# Patient Record
Sex: Female | Born: 1955 | Race: Black or African American | Hispanic: No | Marital: Single | Smoking: Current every day smoker
Health system: Southern US, Community
[De-identification: ages and names within clinical notes are randomized; demographics above are authoritative.]

## PROBLEM LIST (undated history)

## (undated) DIAGNOSIS — I050 Rheumatic mitral stenosis: Secondary | ICD-10-CM

## (undated) DIAGNOSIS — Z7901 Long term (current) use of anticoagulants: Secondary | ICD-10-CM

## (undated) DIAGNOSIS — R569 Unspecified convulsions: Secondary | ICD-10-CM

## (undated) DIAGNOSIS — I10 Essential (primary) hypertension: Secondary | ICD-10-CM

## (undated) DIAGNOSIS — Z8673 Personal history of transient ischemic attack (TIA), and cerebral infarction without residual deficits: Secondary | ICD-10-CM

## (undated) DIAGNOSIS — I739 Peripheral vascular disease, unspecified: Secondary | ICD-10-CM

## (undated) HISTORY — DX: Essential (primary) hypertension: I10

## (undated) HISTORY — DX: Long term (current) use of anticoagulants: Z79.01

## (undated) HISTORY — DX: Personal history of transient ischemic attack (TIA), and cerebral infarction without residual deficits: Z86.73

## (undated) HISTORY — PX: MITRAL VALVE REPLACEMENT: SHX147

## (undated) HISTORY — DX: Unspecified convulsions: R56.9

## (undated) HISTORY — DX: Rheumatic mitral stenosis: I05.0

## (undated) HISTORY — DX: Peripheral vascular disease, unspecified: I73.9

---

## 1996-06-01 HISTORY — PX: CARDIAC CATHETERIZATION: SHX172

## 2000-06-18 ENCOUNTER — Inpatient Hospital Stay (HOSPITAL_COMMUNITY): Admission: EM | Admit: 2000-06-18 | Discharge: 2000-06-22 | Payer: Self-pay | Admitting: Emergency Medicine

## 2007-05-03 ENCOUNTER — Ambulatory Visit: Payer: Self-pay | Admitting: Surgery

## 2007-06-02 ENCOUNTER — Ambulatory Visit: Payer: Self-pay | Admitting: Vascular Surgery

## 2007-07-04 ENCOUNTER — Ambulatory Visit: Payer: Self-pay | Admitting: Surgery

## 2007-07-04 ENCOUNTER — Ambulatory Visit (HOSPITAL_COMMUNITY): Admission: RE | Admit: 2007-07-04 | Discharge: 2007-07-04 | Payer: Self-pay | Admitting: Surgery

## 2007-07-14 ENCOUNTER — Ambulatory Visit: Payer: Self-pay | Admitting: Vascular Surgery

## 2007-10-13 ENCOUNTER — Ambulatory Visit: Payer: Self-pay | Admitting: Vascular Surgery

## 2008-08-29 ENCOUNTER — Ambulatory Visit: Payer: Self-pay | Admitting: Vascular Surgery

## 2009-09-19 ENCOUNTER — Ambulatory Visit: Payer: Self-pay | Admitting: Cardiovascular Disease

## 2009-10-24 ENCOUNTER — Ambulatory Visit: Payer: Self-pay | Admitting: Cardiology

## 2009-11-21 ENCOUNTER — Ambulatory Visit: Payer: Self-pay | Admitting: Cardiovascular Disease

## 2009-12-26 ENCOUNTER — Ambulatory Visit: Payer: Self-pay | Admitting: Cardiovascular Disease

## 2009-12-31 ENCOUNTER — Ambulatory Visit: Payer: Self-pay | Admitting: Vascular Surgery

## 2010-02-15 ENCOUNTER — Encounter: Payer: Self-pay | Admitting: Family Medicine

## 2010-02-17 ENCOUNTER — Ambulatory Visit: Payer: Self-pay | Admitting: Cardiovascular Disease

## 2010-03-12 ENCOUNTER — Other Ambulatory Visit (INDEPENDENT_AMBULATORY_CARE_PROVIDER_SITE_OTHER): Payer: Medicare Other

## 2010-03-12 DIAGNOSIS — Z7901 Long term (current) use of anticoagulants: Secondary | ICD-10-CM

## 2010-03-12 DIAGNOSIS — I119 Hypertensive heart disease without heart failure: Secondary | ICD-10-CM

## 2010-04-22 ENCOUNTER — Ambulatory Visit (INDEPENDENT_AMBULATORY_CARE_PROVIDER_SITE_OTHER): Payer: Medicare Other | Admitting: *Deleted

## 2010-04-22 DIAGNOSIS — Z952 Presence of prosthetic heart valve: Secondary | ICD-10-CM | POA: Insufficient documentation

## 2010-04-22 DIAGNOSIS — I059 Rheumatic mitral valve disease, unspecified: Secondary | ICD-10-CM | POA: Insufficient documentation

## 2010-04-22 DIAGNOSIS — I119 Hypertensive heart disease without heart failure: Secondary | ICD-10-CM

## 2010-04-22 DIAGNOSIS — Z7901 Long term (current) use of anticoagulants: Secondary | ICD-10-CM

## 2010-04-22 LAB — POCT INR: INR: 2.6

## 2010-05-21 ENCOUNTER — Encounter: Payer: Medicare Other | Admitting: *Deleted

## 2010-05-28 ENCOUNTER — Other Ambulatory Visit: Payer: Self-pay | Admitting: *Deleted

## 2010-05-28 DIAGNOSIS — I059 Rheumatic mitral valve disease, unspecified: Secondary | ICD-10-CM

## 2010-05-29 ENCOUNTER — Ambulatory Visit (INDEPENDENT_AMBULATORY_CARE_PROVIDER_SITE_OTHER): Payer: Medicare Other | Admitting: *Deleted

## 2010-05-29 DIAGNOSIS — I059 Rheumatic mitral valve disease, unspecified: Secondary | ICD-10-CM

## 2010-06-09 NOTE — Procedures (Signed)
LOWER EXTREMITY ARTERIAL DUPLEX   INDICATION:  Left lower extremity claudication.   HISTORY:  Diabetes:  No.  Cardiac:  No.  Hypertension:  Yes.  Smoking:  Previous.  Previous Surgery:  Left femoral thrombectomy.   SINGLE LEVEL ARTERIAL EXAM                          RIGHT                LEFT  Brachial:               123                  120  Anterior tibial:        139                  105  Posterior tibial:       133                  104  Peroneal:  Ankle/Brachial Index:   1.13                 0.85   LOWER EXTREMITY ARTERIAL DUPLEX EXAM   DUPLEX:  1. Patent left lower extremity arterial system.  There is an area of      focal stenosis in the mid thigh region with a velocity of 180 cm/s.  2. Distal thigh was not imaged well due to vessel depth.  3. 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   IMPRESSION:  1. Mildly diseased left ankle brachial indices.  2. Patent left arterial system with velocities shown on the following      page.   ___________________________________________  Quita Skye Hart [place], M.D.   EM/MEDQ  D:  12/31/2009  T:  12/31/2009  Job:  [id number]

## 2010-06-09 NOTE — Op Note (Signed)
Natasha Davis, Natasha Davis              ACCOUNT NO.:  [id number]   MEDICAL RECORD NO.:  [id number]         PATIENT TYPE:  CAMB   LOCATION:                                FACILITY:  [facility]   PHYSICIAN:  Juleen China IV, MDDATE OF BIRTH:  04/09/1955   DATE OF PROCEDURE:  07/04/2007  DATE OF DISCHARGE:                               OPERATIVE REPORT   PREOPERATIVE DIAGNOSIS:  Left leg claudication.   POSTOPERATIVE DIAGNOSIS:  Left leg claudication.   PROCEDURE:  1. Right common femoral artery ultrasound active.  2. Abdominal aortogram.  3. Left lower extremity runoff.  4. Second order catheterization.   PROCEDURE:  The patient was identified in the holding area and taken to  the room where she was placed supine on the table.  Bilateral groins  were prepped and draped in standard sterile fashion.  A time-out was  called.  The right common femoral artery was evaluated with ultrasound  and was found to be widely patent.  Lidocaine 1% was used for local  anesthesia.  Using ultrasound, the right common femoral artery was  accessed with an 18-gauge needle.  An 0.035 Bentson wire was advanced in  retrograde fashion into the abdominal aorta under fluoroscopic  visualization.  Next, a 5-French sheath was placed.  Over the wire, an  Omni flush catheter was placed at the level of L1 and abdominal  aortogram was obtained.  Next, catheter was pulled down the aortic  bifurcation.  An end-hole catheter was advanced with left external iliac  artery and left lower extremity runoff.   FINDINGS:  Aortogram:  Visualized portions of the suprarenal abdominal  aorta showed minimal disease. There are single renal arteries  bilaterally which are widely patent. The infrarenal abdominal aorta  showed minimal disease noted.  Bilateral external iliac arteries were  widely patent with minimal disease.   Left lower extremity:  Visualized portions of the left common femoral  artery are widely patent.  There is no  area of stenosis.  The left  profunda femoral artery is widely patent.  The left superficial femoral  artery is widely patent.  There is minimal disease present.  The left  popliteal artery is patent down to the joint space, at which time it  occludes.  Collaterals allow filling of the anterior tibial and peroneal  artery.  The anterior tibial artery crosses the ankle.   After the above images were obtained, decision was made to terminate the  procedure.  Catheters were removed and the patient was taken to the  holding area.  There were no complications.   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           ______________________________  V. Charlena Cross, MD  Electronically Signed     VWB/MEDQ  D:  07/04/2007  T:  07/05/2007  Job:  [id number]

## 2010-06-09 NOTE — Assessment & Plan Note (Signed)
OFFICE VISIT   Natasha Davis, Natasha Davis  DOB:  Sep 25, 1955                                       10/13/2007  JWJXB#:[id number]   Patient presents today for continued discussion of her left leg  discomfort.  I had seen her following an arteriogram in 06/09.  She  continues to have difficulty in her left leg.  She reports that she has  pain from her left hip down towards her left knee, and if she persists  in walking, she can have some very straightforward calf claudication.  She also reports that when she is standing, she has some difficulty in  her left calf and actually has to prop her knee forward to take pressure  off her calf.  Since my visit with her, she has been seen by [facility].  She reportedly has an MRI of her back which  showed no evidence of degenerative disease.  She reports that EEG showed  evidence of carpal tunnel syndrome.  This was on her right arm.  On  further discussion with her, she does report monitored severe symptoms  of this and has difficulty with numbness and actually dropping things  with her right hand.   PHYSICAL EXAMINATION:  Unchanged.  She does not have any evidence of  tissue loss in her left leg.  She does not have palpable left dorsalis  pedis or posterior tibial pulse.  Her foot is well perfused.   I again discussed with the patient that her recent arteriogram showed  that she did have short-segment occlusion of her left popliteal artery.  I explained that this could not explain the pain that she is having in  her left thigh and any resting symptoms that she would have.  I have  referred her to Dr. Annell Greening for further evaluation regarding both her  left hip and leg pain and also right carpal tunnel.  She will see [place]  again on an as-needed basis.   Larina Earthly, M.D.  Electronically Signed   TFE/MEDQ  D:  10/13/2007  T:  10/16/2007  Job:  [id number]   cc:   Vesta Mixer, M.D.  Kirk Ruths, M.D.  Veverly Fells. Ophelia Charter, M.D.

## 2010-06-09 NOTE — Assessment & Plan Note (Signed)
OFFICE VISIT   Natasha Davis, Natasha Davis  DOB:  11/03/1955                                       07/14/2007  ZOXWR#:[id number]   The patient presents today for followup of her recent arteriogram at  [facility]  She was treated with perioperative Lovenox due to  being required to be off Coumadin for the procedure.  The arteriogram  was on June 9th.  She continues to have symptoms of her left leg.  She  reports that she has difficulty with walking but also has pain with  standing still.  She reports that with standing, she has numbness  extending into her left foot and she also has numbness and pain  extending from her knee over her lateral thigh up into her hip and  buttocks.  Her arteriogram reveals completely normal aorta iliac  segments and a completely normal superficial femoral artery.  She does  have an occlusion of her popliteal artery at the level of the knee with  reconstitution of more distal popliteal artery.  She does have three-  vessel runoff to the level of her foot with the dominant vessel into her  foot being the anterior tibial artery.  I discussed this at length with  the patient and her family present.  I explained that she certainly  could have symptoms of calf claudication due to her popliteal artery  occlusion.  I suspect this would be mild due to her mild reduction in  her ankle/arm index.  I explained that any resting symptoms and any pain  throughout her knee, thigh, buttocks or low back could not be attributed  to arterial insufficiency.  This is certainly the majority of her  symptoms.  I explained that we would not recommend any attempt at  revascularization, since the majority of her symptoms could not be  related to her popliteal artery occlusion.  I have taken the liberty to  refer her again for [facility] to determine if there  is a neurogenic etiology for her discomfort.  She will see me again in 3  months for a continued discussion.   Larina Earthly, M.D.  Electronically Signed   TFE/MEDQ  D:  07/14/2007  T:  07/17/2007  Job:  [id number]   cc:   Vesta Mixer, M.D.  Kirk Ruths, M.D.  [facility]

## 2010-06-09 NOTE — Consult Note (Signed)
NEW PATIENT CONSULTATION   Natasha Davis, Natasha Davis  DOB:  Oct 17, 1955                                       06/02/2007  ZOXWR#:[id number]   She is known to me from a prior left leg embolus 11 years ago felt to be  secondary to embolus from mitral valve stenosis.  She is also status  post stroke related to this.  She is status post mitral valve  replacement by Dr. Tyrone Sage in 1998.  She presents today for concern  regarding left leg claudication symptoms.  She actually has several  components to her pain.  She report a numbness in her left leg which can  occur with lying flat, sitting or standing.  She does report relatively  classic calf claudication with walking.  She reports that she is quite  limited with this.  She does report some shortness of breath as well  which limits her activities to some degree.  She is on chronic Coumadin  therapy.  She does have a history of premature atherosclerotic disease  in father and brother.  She is hypertensive.   SOCIAL HISTORY:  She is single.  She does not smoke having quit 1 year  ago.  She does not drink alcohol on a regular basis.   REVIEW OF SYSTEMS:  Positive for weight gain, constipation, leg pain  with walking, seizures, arthritis, history of stroke.   CURRENT MEDICATIONS:  Triamterine hydrochlorothiazide, metoprolol,  warfarin.   PHYSICAL EXAMINATION:  A well-developed, moderately obese black female  appearing her stated age of 1.  Blood pressure is 146/87, pulse 83,  respirations 18.  Her radial pulses are 2+ bilaterally.  She does have  palpable femoral pulses bilaterally.  She has 2+ right dorsalis pedis  pulse and faint to 1+ left dorsalis pedis pulse.  I do not feel a left  popliteal pulse.   She underwent noninvasive vascular studies in our office on 05/03/2007.  This reveals a normal ankle arm index on the right and a diminished  resting ankle arm index on the left at 0.84.  She then underwent an  exercise  study which showed markedly abnormal left leg study with a drop  in her pressure.  I discussed this at length with the patient.  I  explained that she is not at any risk for limb loss with her current  level ischemia.  I explained that this could be treated potentially and  would require arteriography for further evaluation.  I explained that  this may be a lesion amenable to angioplasty.  She reports that she is  unable to tolerate her level of claudication and wishes further  evaluation.  She had Medicaid in the past and now does not have this any  longer.  We will have her apply for this so she can obtain coverage for  this elective procedure and proceed once this has been taken care of.   Larina Earthly, M.D.  Electronically Signed   TFE/MEDQ  D:  06/02/2007  T:  06/03/2007  Job:  [id number]   cc:   Vesta Mixer, M.D.  Kirk Ruths, M.D.

## 2010-06-26 ENCOUNTER — Ambulatory Visit: Payer: Medicare Other | Admitting: Cardiovascular Disease

## 2010-06-26 ENCOUNTER — Other Ambulatory Visit: Payer: Medicare Other | Admitting: *Deleted

## 2010-07-01 ENCOUNTER — Other Ambulatory Visit: Payer: Self-pay | Admitting: *Deleted

## 2010-07-01 ENCOUNTER — Encounter: Payer: Self-pay | Admitting: Cardiovascular Disease

## 2010-07-01 DIAGNOSIS — E785 Hyperlipidemia, unspecified: Secondary | ICD-10-CM

## 2010-07-02 ENCOUNTER — Ambulatory Visit: Payer: Medicare Other | Admitting: Cardiovascular Disease

## 2010-07-02 ENCOUNTER — Encounter: Payer: Medicare Other | Admitting: *Deleted

## 2010-07-02 ENCOUNTER — Other Ambulatory Visit: Payer: Medicare Other | Admitting: *Deleted

## 2010-07-03 ENCOUNTER — Other Ambulatory Visit (INDEPENDENT_AMBULATORY_CARE_PROVIDER_SITE_OTHER): Payer: Medicare Other | Admitting: *Deleted

## 2010-07-03 ENCOUNTER — Other Ambulatory Visit: Payer: Self-pay | Admitting: Cardiovascular Disease

## 2010-07-03 ENCOUNTER — Ambulatory Visit (INDEPENDENT_AMBULATORY_CARE_PROVIDER_SITE_OTHER): Payer: Medicare Other | Admitting: *Deleted

## 2010-07-03 DIAGNOSIS — E785 Hyperlipidemia, unspecified: Secondary | ICD-10-CM

## 2010-07-03 DIAGNOSIS — I059 Rheumatic mitral valve disease, unspecified: Secondary | ICD-10-CM

## 2010-07-03 LAB — LIPID PANEL
Cholesterol: 205 mg/dL — ABNORMAL HIGH (ref 0–200)
Triglycerides: 125 mg/dL (ref 0.0–149.0)

## 2010-07-03 LAB — HEPATIC FUNCTION PANEL
ALT: 30 U/L (ref 0–35)
Albumin: 4.6 g/dL (ref 3.5–5.2)
Total Protein: 7.5 g/dL (ref 6.0–8.3)

## 2010-07-03 LAB — BASIC METABOLIC PANEL
BUN: 15 mg/dL (ref 6–23)
CO2: 32 mEq/L (ref 19–32)
Chloride: 103 mEq/L (ref 96–112)
Creatinine, Ser: 0.8 mg/dL (ref 0.4–1.2)

## 2010-07-14 ENCOUNTER — Telehealth: Payer: Self-pay | Admitting: *Deleted

## 2010-07-14 NOTE — Telephone Encounter (Signed)
Patient called with lab results. Pt verbalized understanding. Jodette Yerania Chamorro RN  

## 2010-07-14 NOTE — Telephone Encounter (Signed)
Message copied by Antony Odea on Tue Jul 14, 2010  4:05 PM ------      Message from: Vesta Mixer      Created: Mon Jul 13, 2010  6:58 PM       Continue to work on diet and exercise.  Will address at apt. Next week.

## 2010-07-17 ENCOUNTER — Encounter: Payer: Self-pay | Admitting: Cardiovascular Disease

## 2010-07-17 ENCOUNTER — Ambulatory Visit (INDEPENDENT_AMBULATORY_CARE_PROVIDER_SITE_OTHER): Payer: Medicare Other | Admitting: Cardiovascular Disease

## 2010-07-17 VITALS — BP 130/78 | HR 80 | Wt 205.0 lb

## 2010-07-17 DIAGNOSIS — Z954 Presence of other heart-valve replacement: Secondary | ICD-10-CM

## 2010-07-17 DIAGNOSIS — Z952 Presence of prosthetic heart valve: Secondary | ICD-10-CM

## 2010-07-17 NOTE — Assessment & Plan Note (Signed)
Pt is doing well.  Coumadin has been therapeutic.

## 2010-07-17 NOTE — Progress Notes (Signed)
Avani Aletta Edouard Date of Birth  [birth year] [facility] / [facility] [address]     [address] [place], [place]  [address] [phone]  Fax  [phone]  History of Present Illness:  Pt is doing well.  Exercising some.  Current Outpatient Prescriptions on File Prior to Visit  Medication Sig Dispense Refill  . Acetaminophen (TYLENOL ARTHRITIS PAIN PO) Take by mouth as needed.        . metoprolol (LOPRESSOR) 50 MG tablet Take 50 mg by mouth daily.        Marland Kitchen triamterene-hydrochlorothiazide (MAXZIDE-25) 37.5-25 MG per tablet Take 1 tablet by mouth daily.        Marland Kitchen warfarin (COUMADIN) 5 MG tablet Take 5 mg by mouth as directed.        Marland Kitchen DISCONTD: AZITHROMYCIN PO Take by mouth daily.          Allergies  Allergen Reactions  . Penicillins     Past Medical History  Diagnosis Date  . Mitral stenosis   . Chronic anticoagulation   . Hypertension   . History of CVA (cerebrovascular accident)   . Claudication   . Seizure     HX    Past Surgical History  Procedure Date  . Cardiac catheterization 06/01/96    NORMAL LEFT VENTRICULAR SYSTOLIC FUNCTION. EF 60%  . Mitral valve replacement   . Cesarean section     X2    History  Smoking status  . Former Smoker  . Quit date: 01/25/2006  Smokeless tobacco  . Not on file    History  Alcohol Use No    Family History  Problem Relation Age of Onset  . Hypertension Mother   . Heart disease Father   . Hypertension Father   . Heart attack Father   . Stroke Brother     Reviw of Systems:  Reviewed in the HPI.  All other systems are negative.  Physical Exam: BP 130/78  Pulse 80  Wt 205 lb (92.987 kg) The patient is alert and oriented x 3.  The mood and affect are normal.   Skin: warm and dry.  Color is normal.    HEENT:   the sclera are nonicteric.  The mucous membranes are moist.  The carotids are 2+ without bruits.  There is no thyromegaly.  There is no JVD.    Lungs: clear.  The chest wall is  non tender.    Heart: regular rate with a normal S1 and a mechanical  S2.  There are no murmurs, gallops, or rubs. The PMI is not displaced.     Abdomin: good bowel sounds.  There is no guarding or rebound.  There is no hepatosplenomegaly or tenderness.  There are no masses.   Extremities:  no clubbing, cyanosis, or edema.  The legs are without rashes.  The distal pulses are intact.   Neuro:  Cranial nerves II - XII are intact.  Motor and sensory functions are intact.    The gait is normal.   Assessment / Plan:

## 2010-07-31 ENCOUNTER — Ambulatory Visit (INDEPENDENT_AMBULATORY_CARE_PROVIDER_SITE_OTHER): Payer: Medicare Other | Admitting: *Deleted

## 2010-07-31 DIAGNOSIS — I059 Rheumatic mitral valve disease, unspecified: Secondary | ICD-10-CM

## 2010-08-28 ENCOUNTER — Ambulatory Visit (INDEPENDENT_AMBULATORY_CARE_PROVIDER_SITE_OTHER): Payer: Medicare Other | Admitting: *Deleted

## 2010-08-28 DIAGNOSIS — I059 Rheumatic mitral valve disease, unspecified: Secondary | ICD-10-CM

## 2010-09-04 ENCOUNTER — Emergency Department (HOSPITAL_COMMUNITY)
Admission: EM | Admit: 2010-09-04 | Discharge: 2010-09-04 | Disposition: A | Discharge status: Home / Self Care | Payer: Medicare Other | Attending: Emergency Medicine | Admitting: Emergency Medicine

## 2010-09-04 ENCOUNTER — Telehealth: Payer: Self-pay | Admitting: Cardiovascular Disease

## 2010-09-04 ENCOUNTER — Other Ambulatory Visit: Payer: Self-pay

## 2010-09-04 ENCOUNTER — Emergency Department (HOSPITAL_COMMUNITY): Payer: Medicare Other

## 2010-09-04 ENCOUNTER — Encounter (HOSPITAL_COMMUNITY): Payer: Self-pay | Admitting: *Deleted

## 2010-09-04 DIAGNOSIS — Z880 Allergy status to penicillin: Secondary | ICD-10-CM | POA: Insufficient documentation

## 2010-09-04 DIAGNOSIS — Z9119 Patient's noncompliance with other medical treatment and regimen: Secondary | ICD-10-CM | POA: Insufficient documentation

## 2010-09-04 DIAGNOSIS — Z91199 Patient's noncompliance with other medical treatment and regimen due to unspecified reason: Secondary | ICD-10-CM | POA: Insufficient documentation

## 2010-09-04 DIAGNOSIS — G40909 Epilepsy, unspecified, not intractable, without status epilepticus: Secondary | ICD-10-CM

## 2010-09-04 DIAGNOSIS — Z7901 Long term (current) use of anticoagulants: Secondary | ICD-10-CM | POA: Insufficient documentation

## 2010-09-04 DIAGNOSIS — I10 Essential (primary) hypertension: Secondary | ICD-10-CM | POA: Insufficient documentation

## 2010-09-04 DIAGNOSIS — F17200 Nicotine dependence, unspecified, uncomplicated: Secondary | ICD-10-CM | POA: Insufficient documentation

## 2010-09-04 DIAGNOSIS — R569 Unspecified convulsions: Secondary | ICD-10-CM

## 2010-09-04 DIAGNOSIS — Z8673 Personal history of transient ischemic attack (TIA), and cerebral infarction without residual deficits: Secondary | ICD-10-CM | POA: Insufficient documentation

## 2010-09-04 LAB — CBC
MCV: 91.6 fL (ref 78.0–100.0)
Platelets: 215 10*3/uL (ref 150–400)
RDW: 12.9 % (ref 11.5–15.5)
WBC: 13.9 10*3/uL — ABNORMAL HIGH (ref 4.0–10.5)

## 2010-09-04 LAB — URINALYSIS, ROUTINE W REFLEX MICROSCOPIC: Ketones, ur: NEGATIVE mg/dL

## 2010-09-04 LAB — BASIC METABOLIC PANEL
Calcium: 9.7 mg/dL (ref 8.4–10.5)
GFR calc non Af Amer: >60 mL/min (ref >60)
Potassium: 3.6 mEq/L (ref 3.5–5.1)
Sodium: 138 mEq/L (ref 135–145)

## 2010-09-04 LAB — DIFFERENTIAL
Basophils Absolute: 0 10*3/uL (ref 0.0–0.1)
Eosinophils Absolute: 0 10*3/uL (ref 0.0–0.7)
Eosinophils Relative: 0 % (ref 0–5)
Lymphocytes Relative: 7 % — ABNORMAL LOW (ref 12–46)
Neutrophils Relative %: 85 % — ABNORMAL HIGH (ref 43–77)

## 2010-09-04 LAB — RAPID URINE DRUG SCREEN, HOSP PERFORMED
Amphetamines: NOT DETECTED
Barbiturates: NOT DETECTED
Benzodiazepines: NOT DETECTED
Cocaine: NOT DETECTED
Tetrahydrocannabinol: NOT DETECTED

## 2010-09-04 LAB — ETHANOL: Alcohol, Ethyl (B): <11 mg/dL (ref 0–11)

## 2010-09-04 MED ORDER — ACETAMINOPHEN-CODEINE #3 300-30 MG PO TABS
2.0000 | ORAL_TABLET | Freq: Once | ORAL | Status: AC
  Administered 2010-09-04: 2 via ORAL
  Filled 2010-09-04: qty 2

## 2010-09-04 MED ORDER — ACETAMINOPHEN 325 MG PO TABS
650.0000 mg | ORAL_TABLET | Freq: Once | ORAL | Status: AC
Start: 2010-09-04 — End: 2010-09-04
  Administered 2010-09-04: 650 mg via ORAL
  Filled 2010-09-04: qty 2

## 2010-09-04 MED ORDER — LEVETIRACETAM 500 MG PO TABS: 500.0000 mg | ORAL_TABLET | Freq: Two times a day (BID) | ORAL | Status: DC

## 2010-09-04 NOTE — ED Notes (Signed)
Family at bedside. Patient and family does not need anything at this time. 

## 2010-09-04 NOTE — ED Provider Notes (Signed)
History   Scribed for Felisa Bonier, MD, the patient was seen in room APA05/APA05 . This chart was scribed by Desma Paganini. This patient's care was started at 2:56 PM .    CSN: [id number] Arrival date & time: 09/04/2010  1:07 PM  Chief Complaint  Patient presents with  . Seizures   HPI Natasha Davis is a 55 y.o. female who presents to the Emergency Department complaining of new seizure activity. The patient reports feeling weak yesterday, states she woke up with a headache this morning and called EMS to bring her in today for the headache and weakness. EMS reports that patient had a single seizure episode en route. Currently feels weak; and describes the headache as right-sided. She denies current nausea, vomiting, diarrhea, dyspnea, and diaphoresis. She reports that she has had prior dx of seizures with her last occurrance in 1998. She was taking Dilantin until 2006.   PAST MEDICAL HISTORY:  Past Medical History  Diagnosis Date  . Mitral stenosis   . Chronic anticoagulation   . Hypertension   . History of CVA (cerebrovascular accident)   . Claudication   . Seizure     HX    PAST SURGICAL HISTORY:  Past Surgical History  Procedure Date  . Cardiac catheterization 06/01/96    NORMAL LEFT VENTRICULAR SYSTOLIC FUNCTION. EF 60%  . Mitral valve replacement   . Cesarean section     X2    MEDICATIONS:  Previous Medications   ACETAMINOPHEN (TYLENOL ARTHRITIS PAIN PO)    Take by mouth as needed.    ACETAMINOPHEN (TYLENOL ARTHRITIS PAIN) 650 MG CR TABLET    Take 650 mg by mouth every 8 (eight) hours as needed. Pain    METOPROLOL (LOPRESSOR) 50 MG TABLET    Take 50 mg by mouth daily.     PHENYLEPHRINE (SUDAFED PE) 10 MG TABS    Take 10 mg by mouth every 4 (four) hours as needed. Sinus Pressure   TRIAMTERENE-HYDROCHLOROTHIAZIDE (MAXZIDE-25) 37.5-25 MG PER TABLET    Take 1 tablet by mouth daily.    WARFARIN (COUMADIN) 5 MG TABLET    Take 7.5-10 mg by mouth as directed. On Mon, Wed,  and Fri patient takes 10mg s and on Sun, Tues, Thurs, and Sat patient takes 7.5mg .     ALLERGIES:  Allergies as of 09/04/2010 - Review Complete 09/04/2010  Allergen Reaction Noted  . Penicillins  07/01/2010     FAMILY HISTORY:  Family History  Problem Relation Age of Onset  . Hypertension Mother   . Heart disease Father   . Hypertension Father   . Heart attack Father   . Stroke Brother      SOCIAL HISTORY: History   Social History  . Marital Status: Single    Spouse Name: N/A    Number of Children: N/A  . Years of Education: N/A   Social History Main Topics  . Smoking status: Current Everyday Smoker -- 1.0 packs/day    Last Attempt to Quit: 01/25/2006  . Smokeless tobacco: None  . Alcohol Use: No  . Drug Use: No  . Sexually Active:    Other Topics Concern  . None   Social History Narrative  . None      Review of Systems 10 Systems reviewed and are negative for acute change except as noted in the HPI.  Physical Exam  BP 110/63  Pulse 85  Resp 20  SpO2 96%  Physical Exam CONSTITUTIONAL: Well developed/well nourished HEAD AND FACE: Normocephalic/atraumatic  EYES: EOMI/PERRL ENMT: Mucous membranes slightly dry; no bite marks on tongue CV: no murmurs/rubs/gallops noted, normal heart sounds LUNGS: Lungs are clear to auscultation bilaterally, no apparent distress ABDOMEN: soft, nontender, no rebound or guarding, normal bowel sounds NEURO: Pt is awake/alert, moves all extremitiesx4; normal coordination; cranial nerves intact; no focal neuro deficits EXTREMITIES: no apparent injuries SKIN: warm, color normal PSYCH: no abnormalities of mood noted   ED Course  Procedures  OTHER DATA REVIEWED: Nursing notes, vital signs, and past medical records reviewed.   DIAGNOSTIC STUDIES: Oxygen Saturation is 98% on room air, normal by my interpretation.    Date: 09/04/2010  Rate:121  Rhythm: sinus tachycardia  QRS Axis: normal  Intervals: normal  ST/T Wave  abnormalities: normal  Conduction Disutrbances:none  Narrative Interpretation: sinus tachycardia, no STEMI  Old EKG Reviewed: unchanged    LABS / RADIOLOGY: Results for orders placed during the hospital encounter of 09/04/10  BASIC METABOLIC PANEL      Component Value Range   Sodium 138  135 - 145 (mEq/L)   Potassium 3.6  3.5 - 5.1 (mEq/L)   Chloride 100  96 - 112 (mEq/L)   CO2 20  19 - 32 (mEq/L)   Glucose, Bld 158 (*) 70 - 99 (mg/dL)   BUN 12  6 - 23 (mg/dL)   Creatinine, Ser 0.98  0.50 - 1.10 (mg/dL)   Calcium 9.7  8.4 - 11.9 (mg/dL)   GFR calc non Af Amer >60  >60 (mL/min)   GFR calc Af Amer >60  >60 (mL/min)  ETHANOL      Component Value Range   Alcohol, Ethyl (B) <11  0 - 11 (mg/dL)      ED COURSE / COORDINATION OF CARE: 14:05. Initial orders include CT Head, CBC, differential, BMP, U/A with microscopic, drug screen panel, ethanol level.   MDM: Differential Diagnosis: Seizure, seizure disorder, electrolyte abnormality, arrhythmia, intracranial mass or tumor, infection, drug or alcohol abuse.   IMPRESSION: Seizure disorder, seizure, medication noncompliance  PLAN:  Discharged home with resumption of anticonvulsant therapy The patient is to return the emergency department if there is any worsening of symptoms. I have reviewed the discharge instructions with the patient and her family  CONDITION ON DISCHARGE: Good   MEDICATIONS GIVEN IN THE E.D.  Medications  phenylephrine (SUDAFED PE) 10 MG TABS (not administered)  acetaminophen (TYLENOL ARTHRITIS PAIN) 650 MG CR tablet (not administered)    DISCHARGE MEDICATIONS: New Prescriptions   No medications on file    Scribe Attestation I personally performed the services described in this documentation, which was scribed in my presence. The recorded information has been reviewed and considered.  CT scan results per radiologist shows no no acute process, remote stroke which is in her history.  Will discharge  home on medications as dictated by Dr. Fredricka Bonine above.  [name]. Oletta Lamas, MD 09/04/10 1478

## 2010-09-04 NOTE — Telephone Encounter (Signed)
Pharmacy called and wants to know dosage of warfarin please call back

## 2010-09-04 NOTE — Telephone Encounter (Signed)
Called pharm @ [facility], pt is 10 mg m/w/f and 7.5 mg on all other days. Last inr was given.

## 2010-09-04 NOTE — ED Notes (Signed)
Seizure activity prior to arrival, has a history of seizures but has not had a seizure for some time. History of blurred vision yesterday

## 2010-09-07 NOTE — ED Provider Notes (Addendum)
History     CSN: [id number] Arrival date & time: 09/04/2010  1:07 PM  Chief Complaint  Patient presents with  . Seizures   HPI  Past Medical History  Diagnosis Date  . Mitral stenosis   . Chronic anticoagulation   . Hypertension   . History of CVA (cerebrovascular accident)   . Claudication   . Seizure     HX    Past Surgical History  Procedure Date  . Cardiac catheterization 06/01/96    NORMAL LEFT VENTRICULAR SYSTOLIC FUNCTION. EF 60%  . Mitral valve replacement   . Cesarean section     X2    Family History  Problem Relation Age of Onset  . Hypertension Mother   . Heart disease Father   . Hypertension Father   . Heart attack Father   . Stroke Brother     History  Substance Use Topics  . Smoking status: Current Everyday Smoker -- 1.0 packs/day    Last Attempt to Quit: 01/25/2006  . Smokeless tobacco: Not on file  . Alcohol Use: No    OB History    Grav Para Term Preterm Abortions TAB SAB Ect Mult Living                  Review of Systems  Physical Exam  BP 154/93  Pulse 80  Resp 20  SpO2 98%  Physical Exam  ED Course  Procedures  MDM Recurrent seizure disorder, medication non-compliance, brain tumor/stroke, arrhythmia, electrolyte abnormality, infection  I personally performed the services described in this documentation, which was scribed in my presence. The recorded information has been reviewed and considered.     Felisa Bonier, MD 09/07/10 1644  Felisa Bonier, MD 09/07/10 [phone]

## 2010-09-23 ENCOUNTER — Telehealth: Payer: Self-pay | Admitting: Cardiovascular Disease

## 2010-09-23 ENCOUNTER — Ambulatory Visit: Payer: Self-pay | Admitting: Cardiology

## 2010-09-23 NOTE — Telephone Encounter (Signed)
Pt called she wanted to know when her next coumadin appt will be please call

## 2010-09-23 NOTE — Telephone Encounter (Signed)
Reviewed chart. Pt to have inr level check 09/25/10. Called pt to inform her and to schedule appt. She is unable to make an appt this Friday. Informed her of the importance of inr and she verbalizes understanding. Pt scheduled for Sept 7th as per her request.

## 2010-09-23 NOTE — Progress Notes (Signed)
Opened in error

## 2010-10-02 ENCOUNTER — Ambulatory Visit (INDEPENDENT_AMBULATORY_CARE_PROVIDER_SITE_OTHER): Payer: Medicare Other | Admitting: *Deleted

## 2010-10-02 DIAGNOSIS — I059 Rheumatic mitral valve disease, unspecified: Secondary | ICD-10-CM

## 2010-10-22 LAB — COMPREHENSIVE METABOLIC PANEL WITH GFR
ALT: 164 — ABNORMAL HIGH
AST: 182 — ABNORMAL HIGH
Albumin: 4
Alkaline Phosphatase: 71
BUN: 8
CO2: 30
Calcium: 9.3
Chloride: 102
Creatinine, Ser: 0.74
GFR calc non Af Amer: >60
Glucose, Bld: 105 — ABNORMAL HIGH
Potassium: 3.6
Sodium: 139
Total Bilirubin: 0.7
Total Protein: 7.3

## 2010-10-22 LAB — CBC
HCT: 36
Hemoglobin: 12.2
MCHC: 33.8
MCV: 90.1
Platelets: 228
RBC: 4
RDW: 13.4
WBC: 5.5

## 2010-10-22 LAB — PROTIME-INR
INR: 1
Prothrombin Time: 13.1

## 2010-10-22 LAB — APTT: aPTT: 30

## 2010-10-30 ENCOUNTER — Encounter: Payer: Medicare Other | Admitting: *Deleted

## 2010-11-06 ENCOUNTER — Ambulatory Visit (INDEPENDENT_AMBULATORY_CARE_PROVIDER_SITE_OTHER): Payer: Medicare Other | Admitting: *Deleted

## 2010-11-06 DIAGNOSIS — I059 Rheumatic mitral valve disease, unspecified: Secondary | ICD-10-CM

## 2010-11-06 LAB — POCT INR: INR: 2.5

## 2010-11-28 ENCOUNTER — Other Ambulatory Visit: Payer: Self-pay | Admitting: Cardiovascular Disease

## 2010-12-04 ENCOUNTER — Encounter: Payer: Medicare Other | Admitting: *Deleted

## 2010-12-14 ENCOUNTER — Ambulatory Visit (INDEPENDENT_AMBULATORY_CARE_PROVIDER_SITE_OTHER): Payer: Medicare Other | Admitting: *Deleted

## 2010-12-14 DIAGNOSIS — Z952 Presence of prosthetic heart valve: Secondary | ICD-10-CM

## 2010-12-14 DIAGNOSIS — I059 Rheumatic mitral valve disease, unspecified: Secondary | ICD-10-CM

## 2011-01-05 ENCOUNTER — Encounter: Payer: Medicare Other | Admitting: *Deleted

## 2011-01-05 ENCOUNTER — Ambulatory Visit: Payer: Medicare Other | Admitting: Cardiovascular Disease

## 2011-01-06 ENCOUNTER — Ambulatory Visit (INDEPENDENT_AMBULATORY_CARE_PROVIDER_SITE_OTHER): Payer: Medicare Other | Admitting: *Deleted

## 2011-01-06 DIAGNOSIS — I059 Rheumatic mitral valve disease, unspecified: Secondary | ICD-10-CM

## 2011-01-06 DIAGNOSIS — Z952 Presence of prosthetic heart valve: Secondary | ICD-10-CM

## 2011-02-03 ENCOUNTER — Encounter: Payer: Self-pay | Admitting: Cardiovascular Disease

## 2011-02-03 ENCOUNTER — Ambulatory Visit (INDEPENDENT_AMBULATORY_CARE_PROVIDER_SITE_OTHER): Payer: Medicare Other | Admitting: Cardiovascular Disease

## 2011-02-03 ENCOUNTER — Ambulatory Visit (INDEPENDENT_AMBULATORY_CARE_PROVIDER_SITE_OTHER): Payer: Medicare Other | Admitting: *Deleted

## 2011-02-03 DIAGNOSIS — Z952 Presence of prosthetic heart valve: Secondary | ICD-10-CM

## 2011-02-03 DIAGNOSIS — I059 Rheumatic mitral valve disease, unspecified: Secondary | ICD-10-CM

## 2011-02-03 DIAGNOSIS — I10 Essential (primary) hypertension: Secondary | ICD-10-CM

## 2011-02-03 LAB — POCT INR: INR: 2.8

## 2011-02-03 NOTE — Progress Notes (Signed)
    Natasha Davis Date of Birth  1956-01-20 [facility]     [place] Office  [address]    [address]   [address] [place], [place]  [address]    [place], [place]  [address] [phone]  Fax  [phone]  [phone]  Fax [phone]   History of Present Illness:  Natasha Davis is a 56 year old female.  1.  Mitral stenosis 2.  Status post mitral valve  placement 3 . Chronic anticoagulation 4.  Hypertension 5.  History of CVA ( prior to being diagnoses with mitral stenosis) 6.  History of claudication 7.  Cigarette smoking  Natasha Davis has done well since I last saw her in December, 2011.  She's not having episodes of chest pain or shortness breath. She's been on Coumadin and her protime levels have been therapeutic.  She quit smoking but started back again.  Current Outpatient Prescriptions on File Prior to Visit  Medication Sig Dispense Refill  . Acetaminophen (TYLENOL ARTHRITIS PAIN PO) Take by mouth as needed.       Marland Kitchen acetaminophen (TYLENOL ARTHRITIS PAIN) 650 MG CR tablet Take 650 mg by mouth every 8 (eight) hours as needed. Pain       . levETIRAcetam (KEPPRA) 500 MG tablet Take 1 tablet (500 mg total) by mouth every 12 (twelve) hours.  30 tablet  1  . metoprolol (LOPRESSOR) 50 MG tablet Take 50 mg by mouth daily.        Marland Kitchen triamterene-hydrochlorothiazide (MAXZIDE-25) 37.5-25 MG per tablet Take 1 tablet by mouth daily.       Marland Kitchen warfarin (COUMADIN) 5 MG tablet TAKE AS DIRECTED  90 tablet  1    Allergies  Allergen Reactions  . Penicillins     Past Medical History  Diagnosis Date  . Mitral stenosis   . Chronic anticoagulation   . Hypertension   . History of CVA (cerebrovascular accident)   . Claudication   . Seizure     HX    Past Surgical History  Procedure Date  . Cardiac catheterization 06/01/96    NORMAL LEFT VENTRICULAR SYSTOLIC FUNCTION. EF 60%  . Mitral valve replacement   . Cesarean section     X2    History  Smoking status  . Current  Everyday Smoker -- 1.0 packs/day  . Last Attempt to Quit: 01/25/2006  Smokeless tobacco  . Not on file    History  Alcohol Use No    Family History  Problem Relation Age of Onset  . Hypertension Mother   . Heart disease Father   . Hypertension Father   . Heart attack Father   . Stroke Brother     Reviw of Systems:  Reviewed in the HPI.  All other systems are negative.  Physical Exam: BP 131/83  Pulse 74  Ht 4\' 11"  (1.499 m)  Wt 207 lb (93.895 kg)  BMI 41.81 kg/m2 The patient is alert and oriented x 3.  The mood and affect are normal.   Skin: warm and dry.  Color is normal.    HEENT:   [place]/AT, normal carotids, no JVD  Lungs: clear  Heart: RR, mechinical S1, normal S2     Abdomen: She is moderately obese. She has good bowel sounds but is no hepatosplenomegaly.  Extremities:  No clubbing cyanosis or edema.  Neuro:  Exam is nonfocal. Gait is normal    ECG:  Assessment / Plan:

## 2011-02-03 NOTE — Assessment & Plan Note (Signed)
Is doing very well. Her INR levels have been therapeutic. We'll continue with her same medication.

## 2011-02-03 NOTE — Patient Instructions (Signed)
Your physician wants you to follow-up in: 1 year You will receive a reminder letter in the mail two months in advance. If you don't receive a letter, please call our office to schedule the follow-up appointment.  Your physician recommends that you return for a FASTING lipid profile: on coumadin clinic date

## 2011-03-17 ENCOUNTER — Other Ambulatory Visit: Payer: Medicare Other

## 2011-03-22 ENCOUNTER — Other Ambulatory Visit (INDEPENDENT_AMBULATORY_CARE_PROVIDER_SITE_OTHER): Payer: Medicare Other

## 2011-03-22 ENCOUNTER — Ambulatory Visit (INDEPENDENT_AMBULATORY_CARE_PROVIDER_SITE_OTHER): Payer: Medicare Other | Admitting: Pharmacist

## 2011-03-22 DIAGNOSIS — Z954 Presence of other heart-valve replacement: Secondary | ICD-10-CM

## 2011-03-22 DIAGNOSIS — Z952 Presence of prosthetic heart valve: Secondary | ICD-10-CM

## 2011-03-22 DIAGNOSIS — I059 Rheumatic mitral valve disease, unspecified: Secondary | ICD-10-CM

## 2011-03-22 DIAGNOSIS — I10 Essential (primary) hypertension: Secondary | ICD-10-CM

## 2011-03-22 LAB — POCT INR: INR: 2.1

## 2011-03-22 MED ORDER — WARFARIN SODIUM 5 MG PO TABS: 5.0000 mg | ORAL_TABLET | Freq: Every day | ORAL | Status: DC

## 2011-03-23 ENCOUNTER — Telehealth: Payer: Self-pay | Admitting: *Deleted

## 2011-03-23 ENCOUNTER — Other Ambulatory Visit: Payer: Self-pay

## 2011-03-23 DIAGNOSIS — E785 Hyperlipidemia, unspecified: Secondary | ICD-10-CM

## 2011-03-23 LAB — HEPATIC FUNCTION PANEL
ALT: 46 U/L — ABNORMAL HIGH (ref 0–35)
AST: 53 U/L — ABNORMAL HIGH (ref 0–37)
Bilirubin, Direct: 0.1 mg/dL (ref 0.0–0.3)
Total Bilirubin: 0.7 mg/dL (ref 0.3–1.2)
Total Protein: 7.2 g/dL (ref 6.0–8.3)

## 2011-03-23 LAB — BASIC METABOLIC PANEL
CO2: 32 mEq/L (ref 19–32)
Chloride: 101 mEq/L (ref 96–112)
Potassium: 3.4 mEq/L — ABNORMAL LOW (ref 3.5–5.1)

## 2011-03-23 LAB — LIPID PANEL: Total CHOL/HDL Ratio: 4

## 2011-03-23 LAB — LDL CHOLESTEROL, DIRECT: Direct LDL: 147.2 mg/dL

## 2011-03-23 MED ORDER — WARFARIN SODIUM 5 MG PO TABS: ORAL_TABLET | ORAL | Status: DC

## 2011-03-23 MED ORDER — ATORVASTATIN CALCIUM 20 MG PO TABS: 20.0000 mg | ORAL_TABLET | Freq: Every day | ORAL | Status: DC

## 2011-03-23 NOTE — Telephone Encounter (Signed)
Patient called with lab results. Labs ordered, mailed potassium rich food list. Pt understands verbally importance of lab follow up for liver enzymes.

## 2011-03-23 NOTE — Telephone Encounter (Signed)
Message copied by Antony Odea on Tue Mar 23, 2011  6:07 PM ------      Message from: Vesta Mixer      Created: Tue Mar 23, 2011  4:52 PM       LDL is elevated.  Start atorvastatin 20 mg a day.  Will need to watch liver enzymes. - they were elevated in the recent past and are still mildly elevated now.  She also needs to eat more potassium

## 2011-04-09 ENCOUNTER — Other Ambulatory Visit: Payer: Medicare Other

## 2011-04-14 ENCOUNTER — Other Ambulatory Visit (INDEPENDENT_AMBULATORY_CARE_PROVIDER_SITE_OTHER): Payer: Medicare Other

## 2011-04-14 ENCOUNTER — Ambulatory Visit (INDEPENDENT_AMBULATORY_CARE_PROVIDER_SITE_OTHER): Payer: Medicare Other | Admitting: *Deleted

## 2011-04-14 DIAGNOSIS — E785 Hyperlipidemia, unspecified: Secondary | ICD-10-CM

## 2011-04-14 DIAGNOSIS — Z954 Presence of other heart-valve replacement: Secondary | ICD-10-CM

## 2011-04-14 DIAGNOSIS — Z952 Presence of prosthetic heart valve: Secondary | ICD-10-CM

## 2011-04-14 DIAGNOSIS — I059 Rheumatic mitral valve disease, unspecified: Secondary | ICD-10-CM

## 2011-04-14 LAB — LIPID PANEL
HDL: 54.4 mg/dL (ref >39.00)
LDL Cholesterol: 67 mg/dL (ref 0–99)
Total CHOL/HDL Ratio: 3
Triglycerides: 126 mg/dL (ref 0.0–149.0)

## 2011-04-14 LAB — HEPATIC FUNCTION PANEL
Bilirubin, Direct: 0 mg/dL (ref 0.0–0.3)
Total Bilirubin: 0.4 mg/dL (ref 0.3–1.2)
Total Protein: 7.7 g/dL (ref 6.0–8.3)

## 2011-04-14 LAB — BASIC METABOLIC PANEL
BUN: 17 mg/dL (ref 6–23)
CO2: 29 mEq/L (ref 19–32)
Calcium: 9.2 mg/dL (ref 8.4–10.5)
Creatinine, Ser: 0.7 mg/dL (ref 0.4–1.2)

## 2011-04-22 ENCOUNTER — Other Ambulatory Visit: Payer: Self-pay | Admitting: *Deleted

## 2011-04-22 MED ORDER — ATORVASTATIN CALCIUM 20 MG PO TABS: 20.0000 mg | ORAL_TABLET | Freq: Every day | ORAL | Status: DC

## 2011-05-12 ENCOUNTER — Telehealth: Payer: Self-pay | Admitting: Cardiovascular Disease

## 2011-05-12 NOTE — Telephone Encounter (Signed)
INRs have been in range.  Should be okay to move appt to the next week.

## 2011-05-12 NOTE — Telephone Encounter (Signed)
New Problem:    Patient called in and rescheduled her INR appointment from 05/12/11 to 05/21/11 due to a lack of transportation.  Please call back if you have any questions or concerns.

## 2011-05-21 ENCOUNTER — Ambulatory Visit (INDEPENDENT_AMBULATORY_CARE_PROVIDER_SITE_OTHER): Payer: Medicare Other | Admitting: *Deleted

## 2011-05-21 DIAGNOSIS — I059 Rheumatic mitral valve disease, unspecified: Secondary | ICD-10-CM

## 2011-05-21 DIAGNOSIS — Z954 Presence of other heart-valve replacement: Secondary | ICD-10-CM

## 2011-05-21 DIAGNOSIS — Z952 Presence of prosthetic heart valve: Secondary | ICD-10-CM

## 2011-06-25 ENCOUNTER — Ambulatory Visit (INDEPENDENT_AMBULATORY_CARE_PROVIDER_SITE_OTHER): Payer: Medicare Other | Admitting: *Deleted

## 2011-06-25 DIAGNOSIS — Z954 Presence of other heart-valve replacement: Secondary | ICD-10-CM

## 2011-06-25 DIAGNOSIS — Z952 Presence of prosthetic heart valve: Secondary | ICD-10-CM

## 2011-06-25 DIAGNOSIS — I059 Rheumatic mitral valve disease, unspecified: Secondary | ICD-10-CM

## 2011-06-25 LAB — POCT INR: INR: 2.2

## 2011-07-21 ENCOUNTER — Ambulatory Visit (INDEPENDENT_AMBULATORY_CARE_PROVIDER_SITE_OTHER): Payer: Medicare Other | Admitting: *Deleted

## 2011-07-21 DIAGNOSIS — Z954 Presence of other heart-valve replacement: Secondary | ICD-10-CM

## 2011-07-21 DIAGNOSIS — I059 Rheumatic mitral valve disease, unspecified: Secondary | ICD-10-CM

## 2011-07-21 DIAGNOSIS — Z952 Presence of prosthetic heart valve: Secondary | ICD-10-CM

## 2011-08-26 ENCOUNTER — Ambulatory Visit (INDEPENDENT_AMBULATORY_CARE_PROVIDER_SITE_OTHER): Payer: Medicare Other | Admitting: Pharmacist

## 2011-08-26 DIAGNOSIS — Z952 Presence of prosthetic heart valve: Secondary | ICD-10-CM

## 2011-08-26 DIAGNOSIS — I059 Rheumatic mitral valve disease, unspecified: Secondary | ICD-10-CM

## 2011-08-26 DIAGNOSIS — Z954 Presence of other heart-valve replacement: Secondary | ICD-10-CM

## 2011-10-07 ENCOUNTER — Ambulatory Visit (INDEPENDENT_AMBULATORY_CARE_PROVIDER_SITE_OTHER): Payer: Medicare Other | Admitting: *Deleted

## 2011-10-07 DIAGNOSIS — Z952 Presence of prosthetic heart valve: Secondary | ICD-10-CM

## 2011-10-07 DIAGNOSIS — I059 Rheumatic mitral valve disease, unspecified: Secondary | ICD-10-CM

## 2011-10-07 DIAGNOSIS — Z954 Presence of other heart-valve replacement: Secondary | ICD-10-CM

## 2011-10-14 ENCOUNTER — Other Ambulatory Visit: Payer: Self-pay | Admitting: *Deleted

## 2011-10-14 ENCOUNTER — Telehealth: Payer: Self-pay | Admitting: Cardiovascular Disease

## 2011-10-14 MED ORDER — WARFARIN SODIUM 5 MG PO TABS: ORAL_TABLET | ORAL | Status: DC

## 2011-10-14 NOTE — Telephone Encounter (Signed)
Pt aware refill sent 

## 2011-10-14 NOTE — Telephone Encounter (Signed)
New Problem:    Patient called in needing a 90 day refill of her warfarin (COUMADIN) 5 MG tablet.

## 2011-10-22 ENCOUNTER — Ambulatory Visit (INDEPENDENT_AMBULATORY_CARE_PROVIDER_SITE_OTHER): Payer: Medicare Other

## 2011-10-22 DIAGNOSIS — I059 Rheumatic mitral valve disease, unspecified: Secondary | ICD-10-CM

## 2011-10-22 DIAGNOSIS — Z952 Presence of prosthetic heart valve: Secondary | ICD-10-CM

## 2011-10-22 DIAGNOSIS — Z954 Presence of other heart-valve replacement: Secondary | ICD-10-CM

## 2011-11-01 ENCOUNTER — Ambulatory Visit (INDEPENDENT_AMBULATORY_CARE_PROVIDER_SITE_OTHER): Payer: Medicare Other | Admitting: *Deleted

## 2011-11-01 DIAGNOSIS — Z954 Presence of other heart-valve replacement: Secondary | ICD-10-CM

## 2011-11-01 DIAGNOSIS — I059 Rheumatic mitral valve disease, unspecified: Secondary | ICD-10-CM

## 2011-11-01 DIAGNOSIS — Z952 Presence of prosthetic heart valve: Secondary | ICD-10-CM

## 2011-11-01 LAB — POCT INR: INR: 2.4

## 2011-11-19 ENCOUNTER — Ambulatory Visit (INDEPENDENT_AMBULATORY_CARE_PROVIDER_SITE_OTHER): Payer: Medicare Other | Admitting: Pharmacist

## 2011-11-19 DIAGNOSIS — I059 Rheumatic mitral valve disease, unspecified: Secondary | ICD-10-CM

## 2011-11-19 DIAGNOSIS — Z954 Presence of other heart-valve replacement: Secondary | ICD-10-CM

## 2011-11-19 DIAGNOSIS — Z952 Presence of prosthetic heart valve: Secondary | ICD-10-CM

## 2011-12-17 ENCOUNTER — Ambulatory Visit (INDEPENDENT_AMBULATORY_CARE_PROVIDER_SITE_OTHER): Payer: Medicare Other | Admitting: *Deleted

## 2011-12-17 DIAGNOSIS — Z952 Presence of prosthetic heart valve: Secondary | ICD-10-CM

## 2011-12-17 DIAGNOSIS — I059 Rheumatic mitral valve disease, unspecified: Secondary | ICD-10-CM

## 2011-12-17 DIAGNOSIS — Z954 Presence of other heart-valve replacement: Secondary | ICD-10-CM

## 2011-12-17 LAB — POCT INR: INR: 3.1

## 2012-01-14 ENCOUNTER — Ambulatory Visit (INDEPENDENT_AMBULATORY_CARE_PROVIDER_SITE_OTHER): Payer: Medicare Other | Admitting: *Deleted

## 2012-01-14 DIAGNOSIS — Z954 Presence of other heart-valve replacement: Secondary | ICD-10-CM

## 2012-01-14 DIAGNOSIS — Z952 Presence of prosthetic heart valve: Secondary | ICD-10-CM

## 2012-01-14 DIAGNOSIS — I059 Rheumatic mitral valve disease, unspecified: Secondary | ICD-10-CM

## 2012-01-14 LAB — POCT INR: INR: 4.2

## 2012-01-28 ENCOUNTER — Ambulatory Visit (INDEPENDENT_AMBULATORY_CARE_PROVIDER_SITE_OTHER): Payer: Medicare Other | Admitting: *Deleted

## 2012-01-28 DIAGNOSIS — I059 Rheumatic mitral valve disease, unspecified: Secondary | ICD-10-CM

## 2012-01-28 DIAGNOSIS — Z952 Presence of prosthetic heart valve: Secondary | ICD-10-CM

## 2012-01-28 DIAGNOSIS — Z954 Presence of other heart-valve replacement: Secondary | ICD-10-CM

## 2012-01-28 LAB — POCT INR: INR: 1.5

## 2012-02-07 ENCOUNTER — Ambulatory Visit (INDEPENDENT_AMBULATORY_CARE_PROVIDER_SITE_OTHER): Payer: Medicare Other | Admitting: Pharmacist

## 2012-02-07 DIAGNOSIS — Z954 Presence of other heart-valve replacement: Secondary | ICD-10-CM

## 2012-02-07 DIAGNOSIS — Z952 Presence of prosthetic heart valve: Secondary | ICD-10-CM

## 2012-02-07 DIAGNOSIS — I059 Rheumatic mitral valve disease, unspecified: Secondary | ICD-10-CM

## 2012-03-03 ENCOUNTER — Ambulatory Visit (INDEPENDENT_AMBULATORY_CARE_PROVIDER_SITE_OTHER): Payer: Medicare Other | Admitting: *Deleted

## 2012-03-03 DIAGNOSIS — I059 Rheumatic mitral valve disease, unspecified: Secondary | ICD-10-CM

## 2012-03-03 DIAGNOSIS — Z954 Presence of other heart-valve replacement: Secondary | ICD-10-CM

## 2012-03-03 DIAGNOSIS — Z952 Presence of prosthetic heart valve: Secondary | ICD-10-CM

## 2012-03-03 LAB — POCT INR: INR: 3.1

## 2012-03-21 ENCOUNTER — Encounter: Payer: Self-pay | Admitting: Cardiovascular Disease

## 2012-03-23 ENCOUNTER — Other Ambulatory Visit: Payer: Self-pay | Admitting: *Deleted

## 2012-03-23 MED ORDER — ATORVASTATIN CALCIUM 20 MG PO TABS: 20.0000 mg | ORAL_TABLET | Freq: Every day | ORAL | Status: DC

## 2012-04-14 ENCOUNTER — Ambulatory Visit (INDEPENDENT_AMBULATORY_CARE_PROVIDER_SITE_OTHER): Payer: Medicare Other | Admitting: Pharmacist

## 2012-04-14 DIAGNOSIS — I059 Rheumatic mitral valve disease, unspecified: Secondary | ICD-10-CM

## 2012-04-14 DIAGNOSIS — Z952 Presence of prosthetic heart valve: Secondary | ICD-10-CM

## 2012-04-14 DIAGNOSIS — Z954 Presence of other heart-valve replacement: Secondary | ICD-10-CM

## 2012-05-04 ENCOUNTER — Ambulatory Visit (INDEPENDENT_AMBULATORY_CARE_PROVIDER_SITE_OTHER): Payer: Medicare Other | Admitting: Cardiovascular Disease

## 2012-05-04 ENCOUNTER — Ambulatory Visit (INDEPENDENT_AMBULATORY_CARE_PROVIDER_SITE_OTHER): Payer: Medicare Other

## 2012-05-04 ENCOUNTER — Encounter: Payer: Self-pay | Admitting: Cardiovascular Disease

## 2012-05-04 VITALS — BP 134/84 | HR 79 | Ht 59.0 in | Wt 210.8 lb

## 2012-05-04 DIAGNOSIS — Z952 Presence of prosthetic heart valve: Secondary | ICD-10-CM

## 2012-05-04 DIAGNOSIS — I059 Rheumatic mitral valve disease, unspecified: Secondary | ICD-10-CM

## 2012-05-04 DIAGNOSIS — Z954 Presence of other heart-valve replacement: Secondary | ICD-10-CM

## 2012-05-04 LAB — POCT INR: INR: 2.7

## 2012-05-04 MED ORDER — ATORVASTATIN CALCIUM 20 MG PO TABS: 20.0000 mg | ORAL_TABLET | Freq: Every day | ORAL | Status: DC

## 2012-05-04 NOTE — Progress Notes (Signed)
Natasha Davis Date of Birth  11-15-1955 [facility]     [place] Office  [address]    [address]   [address] [place], [place]  [address]    [place], [place]  [address] [phone]  Fax  [phone]  [phone]  Fax [phone]   History of Present Illness:  Natasha Davis is a 57 year old female.  1.  Mitral stenosis 2.  Status post mitral valve  placement 3 . Chronic anticoagulation 4.  Hypertension 5.  History of CVA ( prior to being diagnoses with mitral stenosis) 6.  History of claudication 7.  Cigarette smoking  Natasha Davis has done well since I last saw her in December, 2011.  She's not having episodes of chest pain or shortness breath. She's been on Coumadin and her protime levels have been therapeutic.  She quit smoking but started back again.  May 04, 2012  Natasha Davis is doing well from a cardiac standpoint.   She is still smoking.   Current Outpatient Prescriptions on File Prior to Visit  Medication Sig Dispense Refill  . acetaminophen (TYLENOL ARTHRITIS PAIN) 650 MG CR tablet Take 650 mg by mouth every 8 (eight) hours as needed. Pain       . atorvastatin (LIPITOR) 20 MG tablet Take 1 tablet (20 mg total) by mouth daily.  30 tablet  0  . metoprolol (LOPRESSOR) 50 MG tablet Take 50 mg by mouth daily.        Marland Kitchen triamterene-hydrochlorothiazide (MAXZIDE-25) 37.5-25 MG per tablet Take 1 tablet by mouth daily.       Marland Kitchen warfarin (COUMADIN) 5 MG tablet Take as directed by anticoagulation clinic  180 tablet  0   No current facility-administered medications on file prior to visit.    Allergies  Allergen Reactions  . Penicillins     Past Medical History  Diagnosis Date  . Mitral stenosis   . Chronic anticoagulation   . Hypertension   . History of CVA (cerebrovascular accident)   . Claudication   . Seizure     HX    Past Surgical History  Procedure Laterality Date  . Cardiac catheterization  06/01/96    NORMAL LEFT VENTRICULAR SYSTOLIC FUNCTION. EF  60%  . Mitral valve replacement    . Cesarean section      X2    History  Smoking status  . Current Every Day Smoker -- 1.00 packs/day  . Last Attempt to Quit: 01/25/2006  Smokeless tobacco  . Not on file    History  Alcohol Use No    Family History  Problem Relation Age of Onset  . Hypertension Mother   . Heart disease Father   . Hypertension Father   . Heart attack Father   . Stroke Brother     Reviw of Systems:  Reviewed in the HPI.  All other systems are negative.  Physical Exam: BP 134/84  Pulse 79  Ht 4\' 11"  (1.499 m)  Wt 210 lb 12.8 oz (95.618 kg)  BMI 42.55 kg/m2 The patient is alert and oriented x 3.  The mood and affect are normal.   Skin: warm and dry.  Color is normal.    HEENT:   [place]/AT, normal carotids, no JVD  Lungs: clear  Heart: RR, mechinical S1, normal S2     Abdomen: She is moderately obese. She has good bowel sounds but is no hepatosplenomegaly.  Extremities:  No clubbing cyanosis or edema.  Neuro:  Exam is nonfocal. Gait is normal  ECG: [place], 2014: Normal sinus rhythm at 80. She has no ST or T wave changes. Assessment / Plan:

## 2012-05-04 NOTE — Assessment & Plan Note (Addendum)
Natasha Davis is doing very well. We'll continue with her same medications. Her Coumadin levels have been therapeutic.  I have encouraged her to stop smoking.  She  Should work on a good diet and exercise program.  i will see her again in a year.  I reviewed her INR levels. She's been therapeutic for the past several years.

## 2012-05-04 NOTE — Patient Instructions (Addendum)
Your physician wants you to follow-up in: 1 YEAR.  You will receive a reminder letter in the mail two months in advance. If you don't receive a letter, please call our office to schedule the follow-up appointment.  Your physician recommends that you continue on your current medications as directed. Please refer to the Current Medication list given to you today.  

## 2012-05-16 ENCOUNTER — Other Ambulatory Visit: Payer: Self-pay | Admitting: *Deleted

## 2012-05-16 MED ORDER — WARFARIN SODIUM 5 MG PO TABS: ORAL_TABLET | ORAL | Status: DC

## 2012-06-09 ENCOUNTER — Ambulatory Visit (INDEPENDENT_AMBULATORY_CARE_PROVIDER_SITE_OTHER): Payer: Medicare Other | Admitting: *Deleted

## 2012-06-09 DIAGNOSIS — Z954 Presence of other heart-valve replacement: Secondary | ICD-10-CM

## 2012-06-09 DIAGNOSIS — Z952 Presence of prosthetic heart valve: Secondary | ICD-10-CM

## 2012-06-09 DIAGNOSIS — I059 Rheumatic mitral valve disease, unspecified: Secondary | ICD-10-CM

## 2012-07-14 ENCOUNTER — Ambulatory Visit (INDEPENDENT_AMBULATORY_CARE_PROVIDER_SITE_OTHER): Payer: Medicare Other | Admitting: *Deleted

## 2012-07-14 DIAGNOSIS — Z952 Presence of prosthetic heart valve: Secondary | ICD-10-CM

## 2012-07-14 DIAGNOSIS — Z954 Presence of other heart-valve replacement: Secondary | ICD-10-CM

## 2012-07-14 DIAGNOSIS — I059 Rheumatic mitral valve disease, unspecified: Secondary | ICD-10-CM

## 2012-08-25 ENCOUNTER — Ambulatory Visit (INDEPENDENT_AMBULATORY_CARE_PROVIDER_SITE_OTHER): Payer: Medicare Other | Admitting: *Deleted

## 2012-08-25 DIAGNOSIS — Z952 Presence of prosthetic heart valve: Secondary | ICD-10-CM

## 2012-08-25 DIAGNOSIS — Z954 Presence of other heart-valve replacement: Secondary | ICD-10-CM

## 2012-08-25 DIAGNOSIS — I059 Rheumatic mitral valve disease, unspecified: Secondary | ICD-10-CM

## 2012-08-29 ENCOUNTER — Encounter (HOSPITAL_COMMUNITY): Payer: Self-pay

## 2012-08-29 ENCOUNTER — Emergency Department (HOSPITAL_COMMUNITY)
Admission: EM | Admit: 2012-08-29 | Discharge: 2012-08-29 | Disposition: A | Discharge status: Home / Self Care | Payer: Medicare Other | Attending: Emergency Medicine | Admitting: Emergency Medicine

## 2012-08-29 DIAGNOSIS — I10 Essential (primary) hypertension: Secondary | ICD-10-CM | POA: Insufficient documentation

## 2012-08-29 DIAGNOSIS — Z954 Presence of other heart-valve replacement: Secondary | ICD-10-CM | POA: Insufficient documentation

## 2012-08-29 DIAGNOSIS — H1132 Conjunctival hemorrhage, left eye: Secondary | ICD-10-CM

## 2012-08-29 DIAGNOSIS — R6889 Other general symptoms and signs: Secondary | ICD-10-CM | POA: Insufficient documentation

## 2012-08-29 DIAGNOSIS — H579 Unspecified disorder of eye and adnexa: Secondary | ICD-10-CM | POA: Insufficient documentation

## 2012-08-29 DIAGNOSIS — Z79899 Other long term (current) drug therapy: Secondary | ICD-10-CM | POA: Insufficient documentation

## 2012-08-29 DIAGNOSIS — F17200 Nicotine dependence, unspecified, uncomplicated: Secondary | ICD-10-CM | POA: Insufficient documentation

## 2012-08-29 DIAGNOSIS — Z7901 Long term (current) use of anticoagulants: Secondary | ICD-10-CM | POA: Insufficient documentation

## 2012-08-29 DIAGNOSIS — Z8679 Personal history of other diseases of the circulatory system: Secondary | ICD-10-CM | POA: Insufficient documentation

## 2012-08-29 DIAGNOSIS — D689 Coagulation defect, unspecified: Secondary | ICD-10-CM | POA: Insufficient documentation

## 2012-08-29 DIAGNOSIS — Z9889 Other specified postprocedural states: Secondary | ICD-10-CM | POA: Insufficient documentation

## 2012-08-29 DIAGNOSIS — G40909 Epilepsy, unspecified, not intractable, without status epilepticus: Secondary | ICD-10-CM | POA: Insufficient documentation

## 2012-08-29 DIAGNOSIS — H1130 Conjunctival hemorrhage, unspecified eye: Secondary | ICD-10-CM | POA: Insufficient documentation

## 2012-08-29 DIAGNOSIS — Z880 Allergy status to penicillin: Secondary | ICD-10-CM | POA: Insufficient documentation

## 2012-08-29 DIAGNOSIS — Z8673 Personal history of transient ischemic attack (TIA), and cerebral infarction without residual deficits: Secondary | ICD-10-CM | POA: Insufficient documentation

## 2012-08-29 NOTE — ED Notes (Signed)
Pt c/o left eye redness that she first noticed this morning when she woke up. Pt denies pain but states "It feels like something is in my eye". Pt reports drainage from eye,

## 2012-08-29 NOTE — ED Notes (Signed)
Pt reports tht she noticed "blood in my eye at 10 this morning", denies any known injury, no visual disturbances.

## 2012-08-29 NOTE — ED Provider Notes (Addendum)
CSN: [id number]     Arrival date & time 08/29/12  1301 History  This chart was scribed for Ward Givens, MD by Bennett Scrape, ED Scribe. This patient was seen in room APFT24/APFT24 and the patient's care was started at 1:30 PM.   Chief Complaint  Patient presents with  . Eye Problem    Patient is a 57 y.o. female presenting with eye problem. The history is provided by the patient. No language interpreter was used.  Eye Problem Location:  L eye Quality: no pain. Duration:  4 hours Timing:  Constant Progression:  Unchanged Chronicity:  New Context: not direct trauma and not foreign body   Associated symptoms: discharge (chronic, no changes), itching and redness     HPI Comments: Natasha Davis is a 57 y.o. female who presents to the Emergency Department complaining of blood on the left eye that she noticed this morning. She denies pain stating that it feels more like an irration. She reports a mild amount of sneezing yesterday, ongoing watery discharge and itching but denies visual disturbance. She denies coughing.  She denies any sick contacts with eye infections.  INR was 2.8 three days ago. She states that she took 5 mg that day instead of her usual 10 mg but then went back to her usual 10 mg daily. Next INR appointment is in 5 weeks.   PCP is Dr. Regino Schultze No ophthalmologist  Cardiologist Dr Elease Hashimoto  Past Medical History  Diagnosis Date  . Mitral stenosis   . Chronic anticoagulation   . Hypertension   . History of CVA (cerebrovascular accident)   . Claudication   . Seizure     HX   Past Surgical History  Procedure Laterality Date  . Cardiac catheterization  06/01/96    NORMAL LEFT VENTRICULAR SYSTOLIC FUNCTION. EF 60%  . Mitral valve replacement    . Cesarean section      X2   Family History  Problem Relation Age of Onset  . Hypertension Mother   . Heart disease Father   . Hypertension Father   . Heart attack Father   . Stroke Brother    History  Substance Use  Topics  . Smoking status: Current Every Day Smoker -- 1.00 packs/day    Types: Cigarettes    Last Attempt to Quit: 01/25/2006  . Smokeless tobacco: Not on file  . Alcohol Use: No  on disability   Lives at home Lives with spouse   No OB history provided.  Review of Systems  HENT: Positive for sneezing. Negative for nosebleeds.   Eyes: Positive for discharge (chronic, no changes), redness and itching. Negative for pain and visual disturbance.  Respiratory: Negative for cough.     Allergies  Penicillins  Home Medications   Current Outpatient Rx  Name  Route  Sig  Dispense  Refill  . acetaminophen (TYLENOL ARTHRITIS PAIN) 650 MG CR tablet   Oral   Take 650 mg by mouth every 8 (eight) hours as needed. Pain          . atorvastatin (LIPITOR) 20 MG tablet   Oral   Take 1 tablet (20 mg total) by mouth daily.   30 tablet   11   . EXPIRED: levETIRAcetam (KEPPRA) 500 MG tablet   Oral   Take 500 mg by mouth daily.         . metoprolol (LOPRESSOR) 50 MG tablet   Oral   Take 50 mg by mouth daily.           Marland Kitchen  triamterene-hydrochlorothiazide (MAXZIDE-25) 37.5-25 MG per tablet   Oral   Take 1 tablet by mouth daily.          Marland Kitchen warfarin (COUMADIN) 5 MG tablet      Take as directed by anticoagulation clinic   180 tablet   0     90 day supply    Triage Vitals: BP 151/79  Pulse 90  Temp(Src) 98.3 F (36.8 C) (Oral)  Resp 20  Ht 4\' 11"  (1.499 m)  Wt 210 lb (95.255 kg)  BMI 42.39 kg/m2  SpO2 100%  Vital signs normal    Physical Exam  Nursing note and vitals reviewed. Constitutional: She is oriented to person, place, and time. She appears well-developed and well-nourished. No distress.  HENT:  Head: Normocephalic and atraumatic.  Right Ear: External ear normal.  Left Ear: External ear normal.  Eyes: EOM are normal. Pupils are equal, round, and reactive to light.  Subconjunctival hemorrhage of the left eye superiorly and laterally  Neck: Normal range of  motion. Neck supple. No tracheal deviation present.  Pulmonary/Chest: Effort normal. No respiratory distress.  Musculoskeletal: Normal range of motion.  Neurological: She is alert and oriented to person, place, and time.  Skin: Skin is warm and dry.  Psychiatric: She has a normal mood and affect. Her behavior is normal.    ED Course   Procedures (including critical care time)  DIAGNOSTIC STUDIES: Oxygen Saturation is 100% on room air, normal by my interpretation.    COORDINATION OF CARE: 1:40 PM-Advised pt that the symptoms will take approximately 2 weeks to resolve. Advised pt to use ice to alleviate her pain. Discussed treatment plan which includes INR with pt at bedside and pt agreed to plan.    13:10:11  Visual Acuity - Bilateral Distance: 20/40 ; R Distance: 20/40 ; L Distance: 20/40    2:26 PM-Informed pt of INR level of 1.8. Re-discussed discharge which pt agreed to, given information about [facility] and need to see ophthalmologist is getting worse.  Pt given Ice pack for comfort.   Results for orders placed during the hospital encounter of 08/29/12  PROTIME-INR      Result Value Range   Prothrombin Time 21.1 (*) 11.6 - 15.2 seconds   INR 1.89 (*) 0.00 - 1.49   Laboratory interpretation all normal except subtherapeutic INR   1. Subconjunctival hematoma, left   2. Warfarin-induced coagulopathy, initial encounter     Plan discharge  Devoria Albe, MD, FACEP     MDM   I personally performed the services described in this documentation, which was scribed in my presence. The recorded information has been reviewed and considered.  Devoria Albe, MD, FACEP   Ward Givens, MD 08/29/12 1429  Ward Givens, MD 08/29/12 1435

## 2012-09-01 ENCOUNTER — Other Ambulatory Visit: Payer: Self-pay | Admitting: Cardiovascular Disease

## 2012-09-29 ENCOUNTER — Ambulatory Visit (INDEPENDENT_AMBULATORY_CARE_PROVIDER_SITE_OTHER): Payer: Medicare Other | Admitting: *Deleted

## 2012-09-29 DIAGNOSIS — I059 Rheumatic mitral valve disease, unspecified: Secondary | ICD-10-CM

## 2012-09-29 DIAGNOSIS — Z952 Presence of prosthetic heart valve: Secondary | ICD-10-CM

## 2012-09-29 DIAGNOSIS — Z954 Presence of other heart-valve replacement: Secondary | ICD-10-CM

## 2012-09-29 LAB — POCT INR: INR: 2.2

## 2012-10-27 ENCOUNTER — Ambulatory Visit (INDEPENDENT_AMBULATORY_CARE_PROVIDER_SITE_OTHER): Payer: Medicare Other | Admitting: *Deleted

## 2012-10-27 DIAGNOSIS — I059 Rheumatic mitral valve disease, unspecified: Secondary | ICD-10-CM

## 2012-10-27 DIAGNOSIS — Z952 Presence of prosthetic heart valve: Secondary | ICD-10-CM

## 2012-10-27 DIAGNOSIS — Z954 Presence of other heart-valve replacement: Secondary | ICD-10-CM

## 2012-11-01 IMAGING — CT CT HEAD W/O CM
1 series · 16 of 30 positions shown, 20 images · non-contrast
Comparison: None.

CLINICAL DATA: Seizures.  Headache.

CT HEAD WITHOUT CONTRAST
TECHNIQUE: Contiguous axial images were obtained from the base of
the skull through the vertex without contrast.

[Series 2: headtrauma 4.8 h37s · axial · 0.43mm/px · z∈[+1023,+1183]mm · 16 of 36 slices shown, 20 images]
[im 2/36  brain]
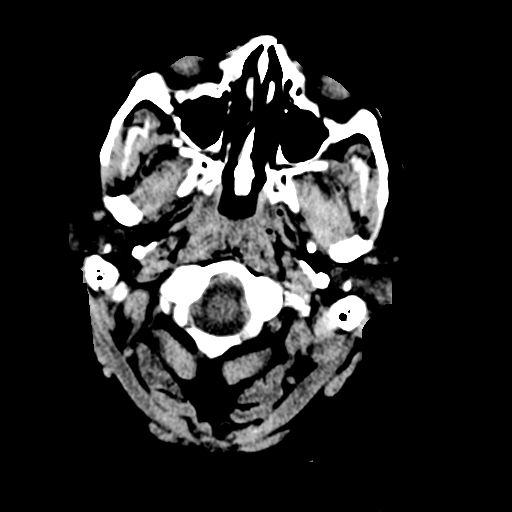
[im 2/36  bone]
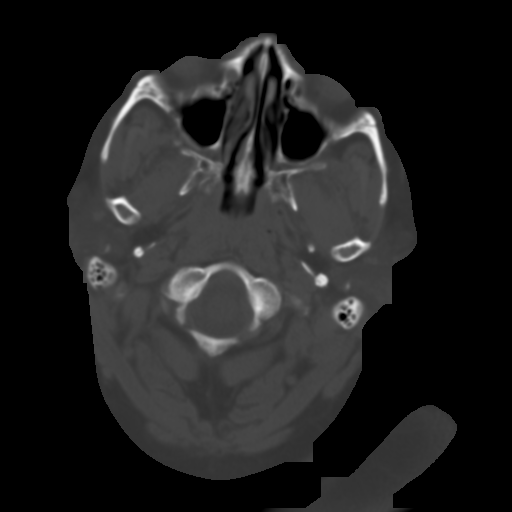
[im 4/36  brain]
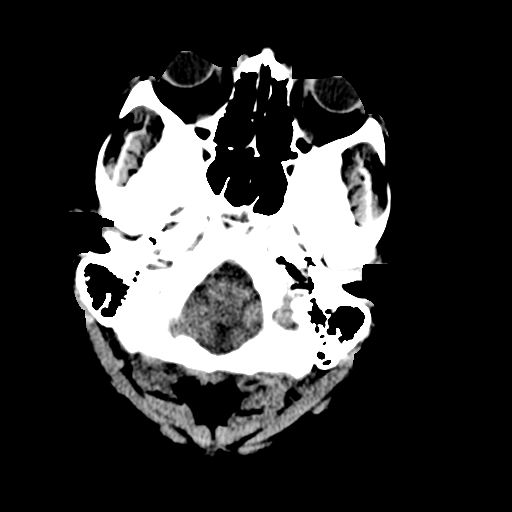
[im 7/36  brain]
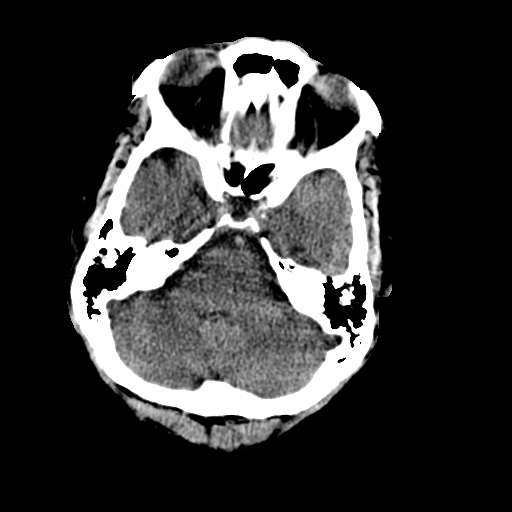
[im 9/36  brain]
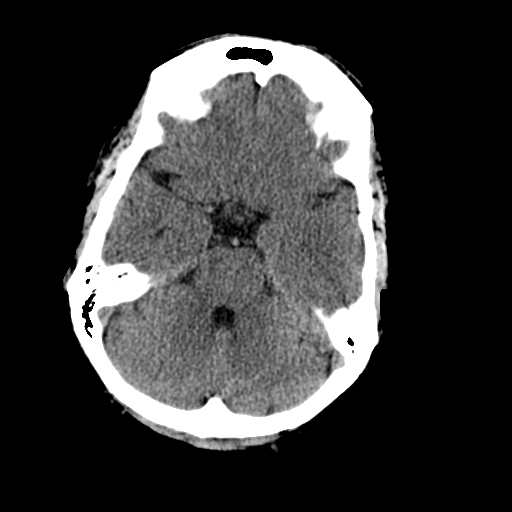
[im 10/36  brain]
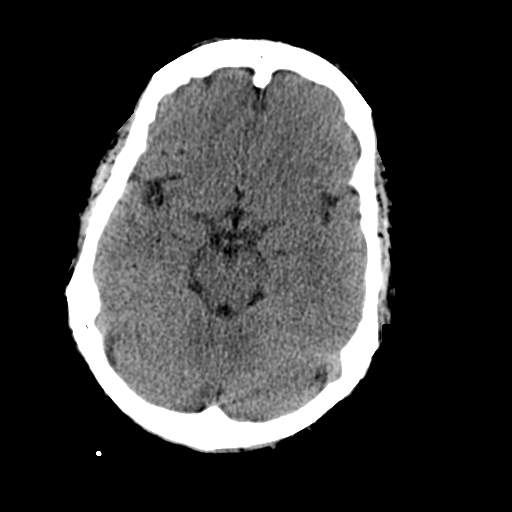
[im 10/36  bone]
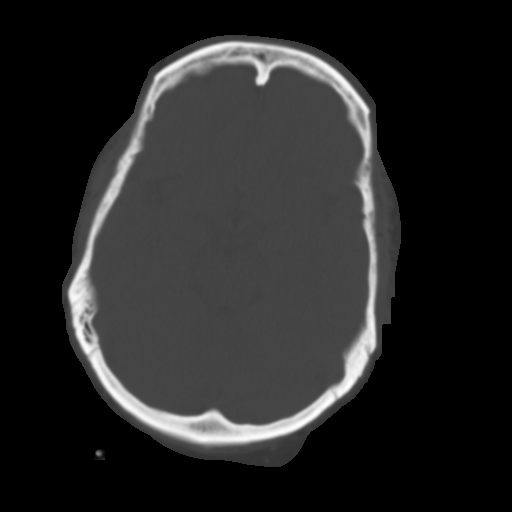
[im 13/36  brain]
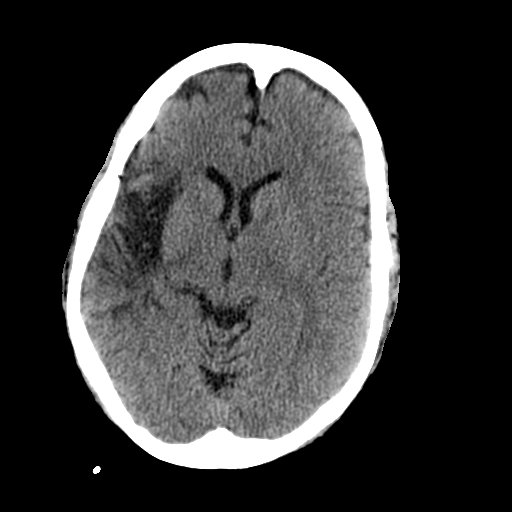
[im 15/36  brain]
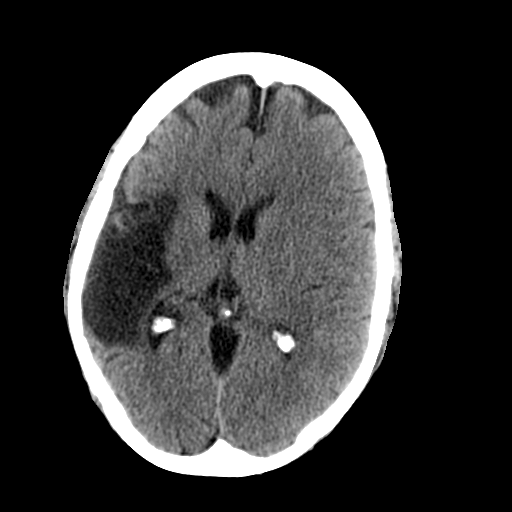
[im 17/36  brain]
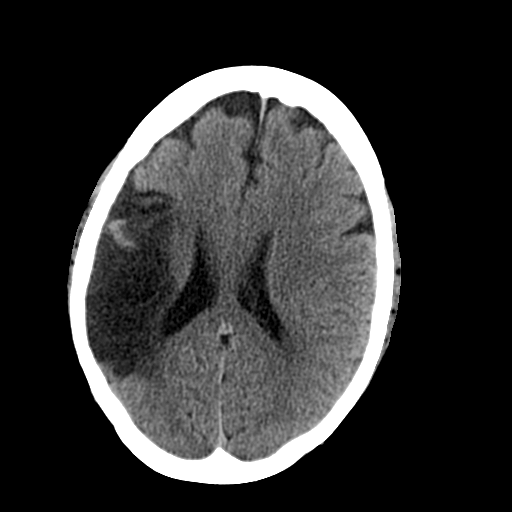
[im 19/36  brain]
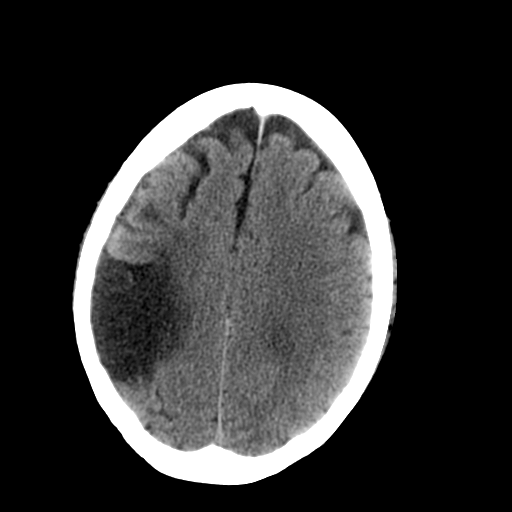
[im 19/36  bone]
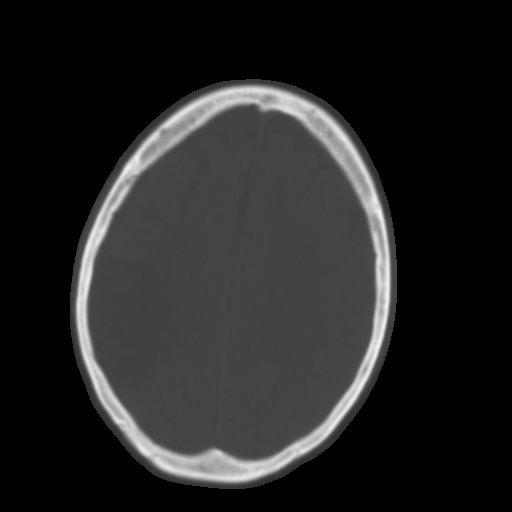
[im 21/36  brain]
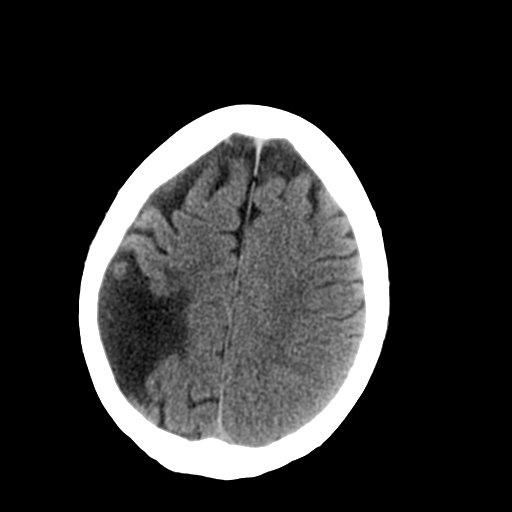
[im 23/36  brain]
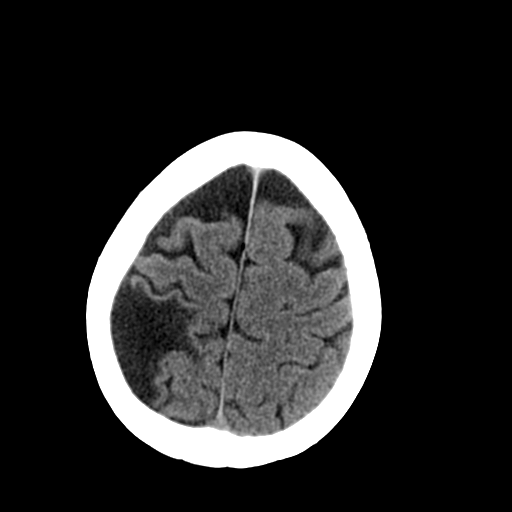
[im 26/36  brain]
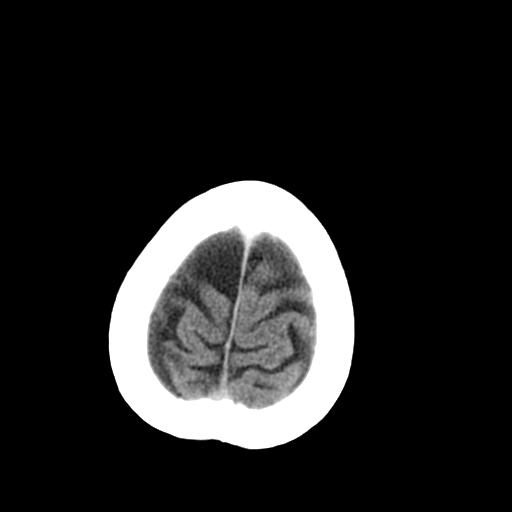
[im 27/36  brain]
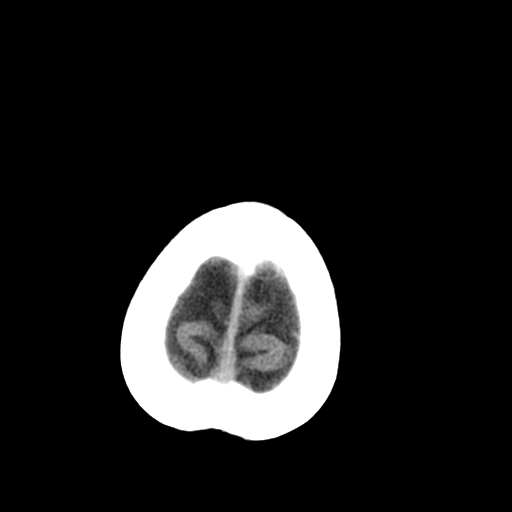
[im 27/36  bone]
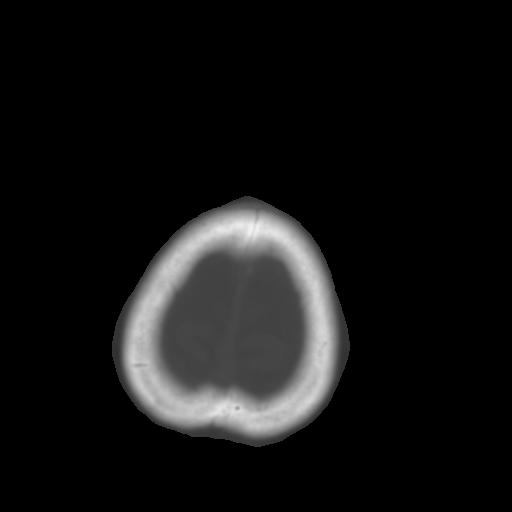
[im 29/36  brain]
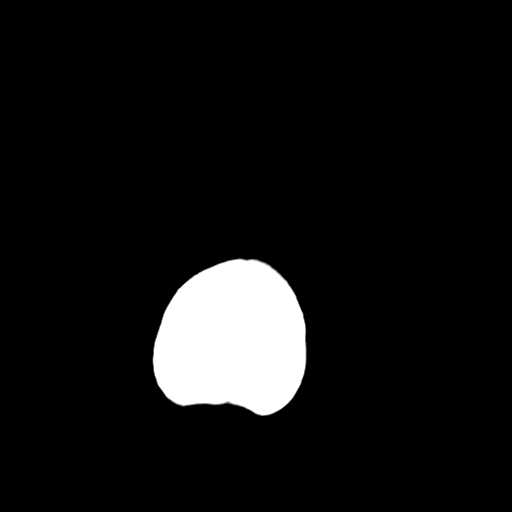
[im 32/36  brain]
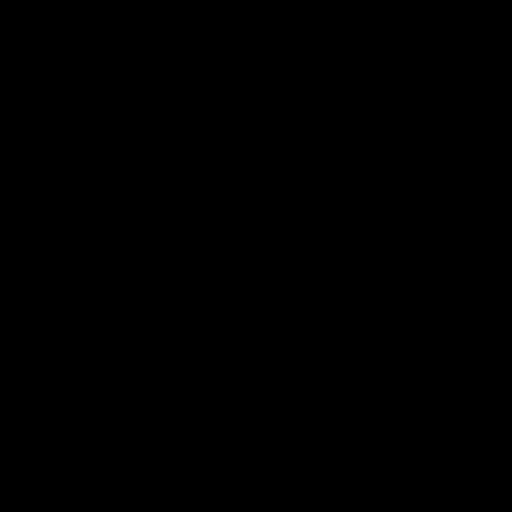
[im 34/36  brain]
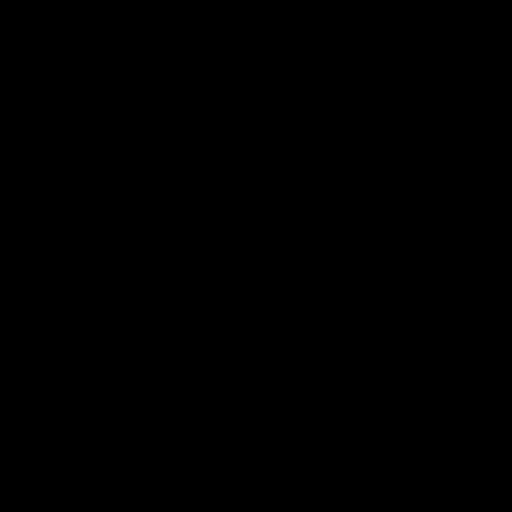

[16 of 30 positions shown; findings below may reference images not displayed]

FINDINGS: Extensive encephalomalacia is seen the right MCA
territory consistent with old infarct.  No evidence of acute
infarction, hemorrhage, mass lesion, midline shift or
hydrocephalus.  Calvarium intact.  Imaged paranasal sinuses and
mastoid air cells are clear.
IMPRESSION: 1.  No acute finding.
2.  Remote, a large MCA territory infarct.

## 2012-11-17 ENCOUNTER — Ambulatory Visit (INDEPENDENT_AMBULATORY_CARE_PROVIDER_SITE_OTHER): Payer: Medicare Other | Admitting: Pharmacist

## 2012-11-17 DIAGNOSIS — Z952 Presence of prosthetic heart valve: Secondary | ICD-10-CM

## 2012-11-17 DIAGNOSIS — I059 Rheumatic mitral valve disease, unspecified: Secondary | ICD-10-CM

## 2012-11-17 DIAGNOSIS — Z954 Presence of other heart-valve replacement: Secondary | ICD-10-CM

## 2012-12-01 ENCOUNTER — Ambulatory Visit (INDEPENDENT_AMBULATORY_CARE_PROVIDER_SITE_OTHER): Payer: Medicare Other | Admitting: Pharmacist

## 2012-12-01 DIAGNOSIS — Z952 Presence of prosthetic heart valve: Secondary | ICD-10-CM

## 2012-12-01 DIAGNOSIS — Z954 Presence of other heart-valve replacement: Secondary | ICD-10-CM

## 2012-12-01 DIAGNOSIS — I059 Rheumatic mitral valve disease, unspecified: Secondary | ICD-10-CM

## 2012-12-01 LAB — POCT INR: INR: 4

## 2012-12-12 ENCOUNTER — Other Ambulatory Visit: Payer: Self-pay | Admitting: Cardiovascular Disease

## 2012-12-18 ENCOUNTER — Ambulatory Visit (INDEPENDENT_AMBULATORY_CARE_PROVIDER_SITE_OTHER): Payer: Medicare Other | Admitting: *Deleted

## 2012-12-18 DIAGNOSIS — I059 Rheumatic mitral valve disease, unspecified: Secondary | ICD-10-CM

## 2012-12-18 DIAGNOSIS — Z952 Presence of prosthetic heart valve: Secondary | ICD-10-CM

## 2012-12-18 DIAGNOSIS — Z954 Presence of other heart-valve replacement: Secondary | ICD-10-CM

## 2012-12-18 LAB — POCT INR: INR: 2.8

## 2013-01-15 ENCOUNTER — Ambulatory Visit (INDEPENDENT_AMBULATORY_CARE_PROVIDER_SITE_OTHER): Payer: Medicare Other | Admitting: Pharmacist

## 2013-01-15 DIAGNOSIS — Z954 Presence of other heart-valve replacement: Secondary | ICD-10-CM

## 2013-01-15 DIAGNOSIS — I059 Rheumatic mitral valve disease, unspecified: Secondary | ICD-10-CM

## 2013-01-15 DIAGNOSIS — Z952 Presence of prosthetic heart valve: Secondary | ICD-10-CM

## 2013-02-23 ENCOUNTER — Ambulatory Visit (INDEPENDENT_AMBULATORY_CARE_PROVIDER_SITE_OTHER): Payer: Medicare Other | Admitting: *Deleted

## 2013-02-23 DIAGNOSIS — Z952 Presence of prosthetic heart valve: Secondary | ICD-10-CM

## 2013-02-23 DIAGNOSIS — Z5181 Encounter for therapeutic drug level monitoring: Secondary | ICD-10-CM | POA: Insufficient documentation

## 2013-02-23 DIAGNOSIS — Z954 Presence of other heart-valve replacement: Secondary | ICD-10-CM

## 2013-02-23 DIAGNOSIS — I059 Rheumatic mitral valve disease, unspecified: Secondary | ICD-10-CM

## 2013-02-23 LAB — POCT INR: INR: 4.3

## 2013-03-30 ENCOUNTER — Ambulatory Visit (INDEPENDENT_AMBULATORY_CARE_PROVIDER_SITE_OTHER): Payer: Medicare Other

## 2013-03-30 DIAGNOSIS — Z954 Presence of other heart-valve replacement: Secondary | ICD-10-CM

## 2013-03-30 DIAGNOSIS — Z952 Presence of prosthetic heart valve: Secondary | ICD-10-CM

## 2013-03-30 DIAGNOSIS — I059 Rheumatic mitral valve disease, unspecified: Secondary | ICD-10-CM

## 2013-03-30 DIAGNOSIS — Z5181 Encounter for therapeutic drug level monitoring: Secondary | ICD-10-CM

## 2013-03-30 LAB — POCT INR: INR: 3.1

## 2013-04-30 ENCOUNTER — Ambulatory Visit: Payer: Medicare Other | Admitting: Cardiovascular Disease

## 2013-05-04 ENCOUNTER — Ambulatory Visit (INDEPENDENT_AMBULATORY_CARE_PROVIDER_SITE_OTHER): Payer: Medicare Other | Admitting: *Deleted

## 2013-05-04 DIAGNOSIS — Z5181 Encounter for therapeutic drug level monitoring: Secondary | ICD-10-CM

## 2013-05-04 DIAGNOSIS — I059 Rheumatic mitral valve disease, unspecified: Secondary | ICD-10-CM

## 2013-05-04 DIAGNOSIS — Z952 Presence of prosthetic heart valve: Secondary | ICD-10-CM

## 2013-05-04 DIAGNOSIS — Z954 Presence of other heart-valve replacement: Secondary | ICD-10-CM

## 2013-05-04 LAB — POCT INR: INR: 3.3

## 2013-05-29 ENCOUNTER — Other Ambulatory Visit: Payer: Self-pay | Admitting: Cardiovascular Disease

## 2013-06-01 ENCOUNTER — Ambulatory Visit (INDEPENDENT_AMBULATORY_CARE_PROVIDER_SITE_OTHER): Payer: Medicare Other | Admitting: Pharmacist

## 2013-06-01 DIAGNOSIS — Z5181 Encounter for therapeutic drug level monitoring: Secondary | ICD-10-CM

## 2013-06-01 DIAGNOSIS — I059 Rheumatic mitral valve disease, unspecified: Secondary | ICD-10-CM

## 2013-06-01 DIAGNOSIS — Z954 Presence of other heart-valve replacement: Secondary | ICD-10-CM

## 2013-06-01 DIAGNOSIS — Z952 Presence of prosthetic heart valve: Secondary | ICD-10-CM

## 2013-06-01 LAB — POCT INR: INR: 2.2

## 2013-06-03 ENCOUNTER — Other Ambulatory Visit: Payer: Self-pay | Admitting: Cardiovascular Disease

## 2013-06-04 ENCOUNTER — Other Ambulatory Visit: Payer: Self-pay

## 2013-06-04 MED ORDER — ATORVASTATIN CALCIUM 20 MG PO TABS: ORAL_TABLET | ORAL | Status: DC

## 2013-06-13 ENCOUNTER — Encounter (INDEPENDENT_AMBULATORY_CARE_PROVIDER_SITE_OTHER): Payer: Self-pay

## 2013-06-13 ENCOUNTER — Ambulatory Visit (INDEPENDENT_AMBULATORY_CARE_PROVIDER_SITE_OTHER): Payer: Medicare Other | Admitting: Cardiovascular Disease

## 2013-06-13 ENCOUNTER — Encounter: Payer: Self-pay | Admitting: Cardiovascular Disease

## 2013-06-13 ENCOUNTER — Ambulatory Visit (INDEPENDENT_AMBULATORY_CARE_PROVIDER_SITE_OTHER): Payer: Medicare Other | Admitting: *Deleted

## 2013-06-13 VITALS — BP 120/74 | HR 82 | Ht 59.0 in | Wt 209.8 lb

## 2013-06-13 DIAGNOSIS — Z954 Presence of other heart-valve replacement: Secondary | ICD-10-CM

## 2013-06-13 DIAGNOSIS — I059 Rheumatic mitral valve disease, unspecified: Secondary | ICD-10-CM

## 2013-06-13 DIAGNOSIS — Z952 Presence of prosthetic heart valve: Secondary | ICD-10-CM

## 2013-06-13 DIAGNOSIS — Z5181 Encounter for therapeutic drug level monitoring: Secondary | ICD-10-CM

## 2013-06-13 LAB — POCT INR: INR: 2.9

## 2013-06-13 MED ORDER — ATORVASTATIN CALCIUM 20 MG PO TABS: 20.0000 mg | ORAL_TABLET | Freq: Every day | ORAL | Status: DC

## 2013-06-13 MED ORDER — TRIAMTERENE-HCTZ 37.5-25 MG PO TABS: 1.0000 | ORAL_TABLET | Freq: Every day | ORAL | Status: DC

## 2013-06-13 MED ORDER — METOPROLOL TARTRATE 50 MG PO TABS: 50.0000 mg | ORAL_TABLET | Freq: Every day | ORAL | Status: DC

## 2013-06-13 NOTE — Patient Instructions (Signed)
Your physician recommends that you continue on your current medications as directed. Please refer to the Current Medication list given to you today.  Your physician wants you to follow-up in: 1 year with Dr. Nahser.  You will receive a reminder letter in the mail two months in advance. If you don't receive a letter, please call our office to schedule the follow-up appointment.  

## 2013-06-13 NOTE — Assessment & Plan Note (Addendum)
Natasha Davis  seems to be doing well. Her Coumadin levels have been therapeutic. She has maintained sinus rhythm. We will continue with same medications.  I will see her in 1 year for follow up .

## 2013-06-13 NOTE — Progress Notes (Signed)
Natasha Davis Date of Birth  03-29-1955 [facility]     [place] Office  [address]    [address]   [address] [place], [place]  [id number]    [place]  [id number] [phone]  Fax  [phone]  Fax [phone]   History of Present Illness:  Natasha Davis is a 58 year old female.  1.  Mitral stenosis 2.  Status post mitral valve  placement 3 . Chronic anticoagulation 4.  Hypertension 5.  History of CVA ( prior to being diagnoses with mitral stenosis) 6.  History of claudication 7.  Cigarette smoking  Natasha Davis has done well since I last saw her in December, 2011.  She's not having episodes of chest pain or shortness breath. She's been on Coumadin and her protime levels have been therapeutic.  She quit smoking but started back again.  May 04, 2012  Natasha Davis is doing well from a cardiac standpoint.   She is still smoking.   Current Outpatient Prescriptions on File Prior to Visit  Medication Sig Dispense Refill  . acetaminophen (TYLENOL ARTHRITIS PAIN) 650 MG CR tablet Take 650 mg by mouth every 8 (eight) hours as needed. Pain       . levETIRAcetam (KEPPRA) 500 MG tablet Take 500 mg by mouth daily.      . metoprolol (LOPRESSOR) 50 MG tablet Take 50 mg by mouth daily.        Marland Kitchen. triamterene-hydrochlorothiazide (MAXZIDE-25) 37.5-25 MG per tablet Take 1 tablet by mouth daily.       Marland Kitchen. warfarin (COUMADIN) 5 MG tablet TAKE AS DIRECTED BY ANTICOAGULATION CLINIC  180 tablet  1   No current facility-administered medications on file prior to visit.    Allergies  Allergen Reactions  . Penicillins     Past Medical History  Diagnosis Date  . Mitral stenosis   . Chronic anticoagulation   . Hypertension   . History of CVA (cerebrovascular accident)   . Claudication   . Seizure     HX    Past Surgical History  Procedure Laterality Date  . Cardiac catheterization  06/01/96    NORMAL LEFT VENTRICULAR SYSTOLIC FUNCTION. EF 60%  . Mitral valve  replacement    . Cesarean section      X2    History  Smoking status  . Current Every Day Smoker -- 1.00 packs/day  . Types: Cigarettes  . Last Attempt to Quit: 01/25/2006  Smokeless tobacco  . Not on file    History  Alcohol Use No    Family History  Problem Relation Age of Onset  . Hypertension Mother   . Heart disease Father   . Hypertension Father   . Heart attack Father   . Stroke Brother     Reviw of Systems:  Reviewed in the HPI.  All other systems are negative.  Physical Exam: BP 120/74  Pulse 82  Ht 4\' 11"  (1.499 m)  Wt 209 lb 12.8 oz (95.165 kg)  BMI 42.35 kg/m2 The patient is alert and oriented x 3.  The mood and affect are normal.   Skin: warm and dry.  Color is normal.    HEENT:   /AT, normal carotids, no JVD  Lungs: clear  Heart: RR, mechinical S1, normal S2     Abdomen: She is moderately obese. She has good bowel sounds but is no hepatosplenomegaly.  Extremities:  No clubbing cyanosis or edema.  Neuro:  Exam is nonfocal. Gait is normal  ECG: Jun 13, 2013:   Normal sinus rhythm at 80. She has no ST or T wave changes. Assessment / Plan:

## 2013-06-18 ENCOUNTER — Other Ambulatory Visit: Payer: Self-pay | Admitting: Cardiovascular Disease

## 2013-07-02 ENCOUNTER — Other Ambulatory Visit: Payer: Self-pay | Admitting: Cardiovascular Disease

## 2013-07-18 ENCOUNTER — Ambulatory Visit (INDEPENDENT_AMBULATORY_CARE_PROVIDER_SITE_OTHER): Payer: Medicare Other | Admitting: Pharmacist

## 2013-07-18 DIAGNOSIS — Z5181 Encounter for therapeutic drug level monitoring: Secondary | ICD-10-CM

## 2013-07-18 DIAGNOSIS — Z954 Presence of other heart-valve replacement: Secondary | ICD-10-CM

## 2013-07-18 DIAGNOSIS — Z952 Presence of prosthetic heart valve: Secondary | ICD-10-CM

## 2013-07-18 DIAGNOSIS — I059 Rheumatic mitral valve disease, unspecified: Secondary | ICD-10-CM

## 2013-07-18 LAB — POCT INR: INR: 2.7

## 2013-08-24 ENCOUNTER — Ambulatory Visit (INDEPENDENT_AMBULATORY_CARE_PROVIDER_SITE_OTHER): Payer: Medicare Other | Admitting: Pharmacist

## 2013-08-24 DIAGNOSIS — Z954 Presence of other heart-valve replacement: Secondary | ICD-10-CM

## 2013-08-24 DIAGNOSIS — Z5181 Encounter for therapeutic drug level monitoring: Secondary | ICD-10-CM

## 2013-08-24 DIAGNOSIS — I059 Rheumatic mitral valve disease, unspecified: Secondary | ICD-10-CM

## 2013-08-24 DIAGNOSIS — Z952 Presence of prosthetic heart valve: Secondary | ICD-10-CM

## 2013-08-24 LAB — POCT INR: INR: 2.6

## 2013-10-12 ENCOUNTER — Ambulatory Visit (INDEPENDENT_AMBULATORY_CARE_PROVIDER_SITE_OTHER): Payer: Medicare Other | Admitting: *Deleted

## 2013-10-12 DIAGNOSIS — Z954 Presence of other heart-valve replacement: Secondary | ICD-10-CM

## 2013-10-12 DIAGNOSIS — Z952 Presence of prosthetic heart valve: Secondary | ICD-10-CM

## 2013-10-12 DIAGNOSIS — I059 Rheumatic mitral valve disease, unspecified: Secondary | ICD-10-CM

## 2013-10-12 DIAGNOSIS — Z5181 Encounter for therapeutic drug level monitoring: Secondary | ICD-10-CM

## 2013-10-12 LAB — POCT INR: INR: 3.4

## 2013-11-23 ENCOUNTER — Ambulatory Visit (INDEPENDENT_AMBULATORY_CARE_PROVIDER_SITE_OTHER): Payer: Medicare Other | Admitting: *Deleted

## 2013-11-23 DIAGNOSIS — Z5181 Encounter for therapeutic drug level monitoring: Secondary | ICD-10-CM

## 2013-11-23 DIAGNOSIS — I059 Rheumatic mitral valve disease, unspecified: Secondary | ICD-10-CM

## 2013-11-23 DIAGNOSIS — Z954 Presence of other heart-valve replacement: Secondary | ICD-10-CM

## 2013-11-23 DIAGNOSIS — Z952 Presence of prosthetic heart valve: Secondary | ICD-10-CM

## 2013-11-23 LAB — POCT INR: INR: 1.9

## 2013-12-26 ENCOUNTER — Other Ambulatory Visit: Payer: Self-pay | Admitting: Cardiovascular Disease

## 2013-12-28 ENCOUNTER — Ambulatory Visit (INDEPENDENT_AMBULATORY_CARE_PROVIDER_SITE_OTHER): Payer: Medicare Other | Admitting: *Deleted

## 2013-12-28 DIAGNOSIS — Z952 Presence of prosthetic heart valve: Secondary | ICD-10-CM

## 2013-12-28 DIAGNOSIS — Z954 Presence of other heart-valve replacement: Secondary | ICD-10-CM

## 2013-12-28 DIAGNOSIS — I059 Rheumatic mitral valve disease, unspecified: Secondary | ICD-10-CM

## 2013-12-28 DIAGNOSIS — Z5181 Encounter for therapeutic drug level monitoring: Secondary | ICD-10-CM

## 2013-12-28 LAB — POCT INR: INR: 3

## 2014-02-01 ENCOUNTER — Ambulatory Visit (INDEPENDENT_AMBULATORY_CARE_PROVIDER_SITE_OTHER): Payer: Medicare Other | Admitting: *Deleted

## 2014-02-01 DIAGNOSIS — Z952 Presence of prosthetic heart valve: Secondary | ICD-10-CM

## 2014-02-01 DIAGNOSIS — I059 Rheumatic mitral valve disease, unspecified: Secondary | ICD-10-CM | POA: Diagnosis not present

## 2014-02-01 DIAGNOSIS — Z954 Presence of other heart-valve replacement: Secondary | ICD-10-CM | POA: Diagnosis not present

## 2014-02-01 DIAGNOSIS — Z5181 Encounter for therapeutic drug level monitoring: Secondary | ICD-10-CM

## 2014-02-01 LAB — POCT INR: INR: 3

## 2014-03-13 DIAGNOSIS — Z0001 Encounter for general adult medical examination with abnormal findings: Secondary | ICD-10-CM | POA: Diagnosis not present

## 2014-03-13 DIAGNOSIS — Z6838 Body mass index (BMI) 38.0-38.9, adult: Secondary | ICD-10-CM | POA: Diagnosis not present

## 2014-03-13 DIAGNOSIS — I69398 Other sequelae of cerebral infarction: Secondary | ICD-10-CM | POA: Diagnosis not present

## 2014-03-13 DIAGNOSIS — I10 Essential (primary) hypertension: Secondary | ICD-10-CM | POA: Diagnosis not present

## 2014-03-22 ENCOUNTER — Other Ambulatory Visit: Payer: Self-pay | Admitting: Cardiovascular Disease

## 2014-03-22 ENCOUNTER — Ambulatory Visit (INDEPENDENT_AMBULATORY_CARE_PROVIDER_SITE_OTHER): Payer: Medicare Other

## 2014-03-22 DIAGNOSIS — Z954 Presence of other heart-valve replacement: Secondary | ICD-10-CM | POA: Diagnosis not present

## 2014-03-22 DIAGNOSIS — I059 Rheumatic mitral valve disease, unspecified: Secondary | ICD-10-CM

## 2014-03-22 DIAGNOSIS — Z952 Presence of prosthetic heart valve: Secondary | ICD-10-CM

## 2014-03-22 DIAGNOSIS — Z5181 Encounter for therapeutic drug level monitoring: Secondary | ICD-10-CM

## 2014-03-22 LAB — POCT INR: INR: 2.3

## 2014-03-22 MED ORDER — WARFARIN SODIUM 5 MG PO TABS: ORAL_TABLET | ORAL | Status: DC

## 2014-05-03 ENCOUNTER — Ambulatory Visit (INDEPENDENT_AMBULATORY_CARE_PROVIDER_SITE_OTHER): Payer: Medicare Other | Admitting: *Deleted

## 2014-05-03 DIAGNOSIS — Z954 Presence of other heart-valve replacement: Secondary | ICD-10-CM

## 2014-05-03 DIAGNOSIS — I059 Rheumatic mitral valve disease, unspecified: Secondary | ICD-10-CM

## 2014-05-03 DIAGNOSIS — Z5181 Encounter for therapeutic drug level monitoring: Secondary | ICD-10-CM | POA: Diagnosis not present

## 2014-05-03 DIAGNOSIS — Z952 Presence of prosthetic heart valve: Secondary | ICD-10-CM

## 2014-05-03 LAB — POCT INR: INR: 3.2

## 2014-06-08 ENCOUNTER — Other Ambulatory Visit: Payer: Self-pay | Admitting: Cardiovascular Disease

## 2014-06-21 ENCOUNTER — Ambulatory Visit (INDEPENDENT_AMBULATORY_CARE_PROVIDER_SITE_OTHER): Payer: Medicare Other

## 2014-06-21 DIAGNOSIS — Z5181 Encounter for therapeutic drug level monitoring: Secondary | ICD-10-CM | POA: Diagnosis not present

## 2014-06-21 DIAGNOSIS — I059 Rheumatic mitral valve disease, unspecified: Secondary | ICD-10-CM | POA: Diagnosis not present

## 2014-06-21 DIAGNOSIS — Z954 Presence of other heart-valve replacement: Secondary | ICD-10-CM

## 2014-06-21 DIAGNOSIS — Z952 Presence of prosthetic heart valve: Secondary | ICD-10-CM

## 2014-06-21 LAB — POCT INR: INR: 3.3

## 2014-08-02 ENCOUNTER — Ambulatory Visit (INDEPENDENT_AMBULATORY_CARE_PROVIDER_SITE_OTHER): Payer: Medicare Other | Admitting: *Deleted

## 2014-08-02 DIAGNOSIS — Z5181 Encounter for therapeutic drug level monitoring: Secondary | ICD-10-CM | POA: Diagnosis not present

## 2014-08-02 DIAGNOSIS — Z954 Presence of other heart-valve replacement: Secondary | ICD-10-CM

## 2014-08-02 DIAGNOSIS — I059 Rheumatic mitral valve disease, unspecified: Secondary | ICD-10-CM

## 2014-08-02 DIAGNOSIS — Z952 Presence of prosthetic heart valve: Secondary | ICD-10-CM

## 2014-08-02 LAB — POCT INR: INR: 2.5

## 2014-09-20 ENCOUNTER — Ambulatory Visit (INDEPENDENT_AMBULATORY_CARE_PROVIDER_SITE_OTHER): Payer: Medicare Other | Admitting: *Deleted

## 2014-09-20 DIAGNOSIS — Z954 Presence of other heart-valve replacement: Secondary | ICD-10-CM | POA: Diagnosis not present

## 2014-09-20 DIAGNOSIS — Z5181 Encounter for therapeutic drug level monitoring: Secondary | ICD-10-CM

## 2014-09-20 DIAGNOSIS — I059 Rheumatic mitral valve disease, unspecified: Secondary | ICD-10-CM | POA: Diagnosis not present

## 2014-09-20 DIAGNOSIS — Z952 Presence of prosthetic heart valve: Secondary | ICD-10-CM

## 2014-09-20 LAB — POCT INR: INR: 3.1

## 2014-11-01 ENCOUNTER — Ambulatory Visit (INDEPENDENT_AMBULATORY_CARE_PROVIDER_SITE_OTHER): Payer: Medicare Other | Admitting: *Deleted

## 2014-11-01 DIAGNOSIS — Z5181 Encounter for therapeutic drug level monitoring: Secondary | ICD-10-CM

## 2014-11-01 DIAGNOSIS — Z954 Presence of other heart-valve replacement: Secondary | ICD-10-CM | POA: Diagnosis not present

## 2014-11-01 DIAGNOSIS — I059 Rheumatic mitral valve disease, unspecified: Secondary | ICD-10-CM

## 2014-11-01 DIAGNOSIS — Z952 Presence of prosthetic heart valve: Secondary | ICD-10-CM

## 2014-11-01 LAB — POCT INR: INR: 3

## 2014-12-13 ENCOUNTER — Ambulatory Visit (INDEPENDENT_AMBULATORY_CARE_PROVIDER_SITE_OTHER): Payer: Medicare Other

## 2014-12-13 DIAGNOSIS — Z952 Presence of prosthetic heart valve: Secondary | ICD-10-CM

## 2014-12-13 DIAGNOSIS — Z5181 Encounter for therapeutic drug level monitoring: Secondary | ICD-10-CM | POA: Diagnosis not present

## 2014-12-13 DIAGNOSIS — I059 Rheumatic mitral valve disease, unspecified: Secondary | ICD-10-CM

## 2014-12-13 DIAGNOSIS — Z954 Presence of other heart-valve replacement: Secondary | ICD-10-CM | POA: Diagnosis not present

## 2014-12-13 LAB — POCT INR: INR: 2.6

## 2015-01-31 ENCOUNTER — Ambulatory Visit (INDEPENDENT_AMBULATORY_CARE_PROVIDER_SITE_OTHER): Payer: Medicare Other

## 2015-01-31 DIAGNOSIS — Z954 Presence of other heart-valve replacement: Secondary | ICD-10-CM | POA: Diagnosis not present

## 2015-01-31 DIAGNOSIS — I059 Rheumatic mitral valve disease, unspecified: Secondary | ICD-10-CM

## 2015-01-31 DIAGNOSIS — Z5181 Encounter for therapeutic drug level monitoring: Secondary | ICD-10-CM

## 2015-01-31 DIAGNOSIS — Z952 Presence of prosthetic heart valve: Secondary | ICD-10-CM

## 2015-01-31 LAB — POCT INR: INR: 3.6

## 2015-03-21 ENCOUNTER — Ambulatory Visit (INDEPENDENT_AMBULATORY_CARE_PROVIDER_SITE_OTHER): Payer: Medicare Other | Admitting: *Deleted

## 2015-03-21 DIAGNOSIS — I059 Rheumatic mitral valve disease, unspecified: Secondary | ICD-10-CM | POA: Diagnosis not present

## 2015-03-21 DIAGNOSIS — Z954 Presence of other heart-valve replacement: Secondary | ICD-10-CM | POA: Diagnosis not present

## 2015-03-21 DIAGNOSIS — Z5181 Encounter for therapeutic drug level monitoring: Secondary | ICD-10-CM | POA: Diagnosis not present

## 2015-03-21 DIAGNOSIS — Z952 Presence of prosthetic heart valve: Secondary | ICD-10-CM

## 2015-03-21 LAB — POCT INR: INR: 3

## 2015-04-26 ENCOUNTER — Other Ambulatory Visit: Payer: Self-pay | Admitting: Cardiovascular Disease

## 2015-05-02 ENCOUNTER — Ambulatory Visit (INDEPENDENT_AMBULATORY_CARE_PROVIDER_SITE_OTHER): Payer: Medicare Other | Admitting: *Deleted

## 2015-05-02 DIAGNOSIS — I059 Rheumatic mitral valve disease, unspecified: Secondary | ICD-10-CM | POA: Diagnosis not present

## 2015-05-02 DIAGNOSIS — Z5181 Encounter for therapeutic drug level monitoring: Secondary | ICD-10-CM | POA: Diagnosis not present

## 2015-05-02 DIAGNOSIS — Z952 Presence of prosthetic heart valve: Secondary | ICD-10-CM

## 2015-05-02 DIAGNOSIS — Z954 Presence of other heart-valve replacement: Secondary | ICD-10-CM | POA: Diagnosis not present

## 2015-05-02 LAB — POCT INR: INR: 2.9

## 2015-06-11 ENCOUNTER — Other Ambulatory Visit: Payer: Self-pay | Admitting: Cardiovascular Disease

## 2015-06-20 ENCOUNTER — Ambulatory Visit (INDEPENDENT_AMBULATORY_CARE_PROVIDER_SITE_OTHER): Payer: Medicare Other | Admitting: Pharmacist

## 2015-06-20 ENCOUNTER — Ambulatory Visit (INDEPENDENT_AMBULATORY_CARE_PROVIDER_SITE_OTHER): Payer: Medicare Other | Admitting: Cardiovascular Disease

## 2015-06-20 ENCOUNTER — Encounter: Payer: Self-pay | Admitting: Cardiovascular Disease

## 2015-06-20 VITALS — BP 128/80 | HR 75 | Ht 59.0 in | Wt 198.8 lb

## 2015-06-20 DIAGNOSIS — I10 Essential (primary) hypertension: Secondary | ICD-10-CM | POA: Insufficient documentation

## 2015-06-20 DIAGNOSIS — I348 Other nonrheumatic mitral valve disorders: Secondary | ICD-10-CM | POA: Diagnosis not present

## 2015-06-20 DIAGNOSIS — I059 Rheumatic mitral valve disease, unspecified: Secondary | ICD-10-CM

## 2015-06-20 DIAGNOSIS — Z952 Presence of prosthetic heart valve: Secondary | ICD-10-CM

## 2015-06-20 DIAGNOSIS — Z5181 Encounter for therapeutic drug level monitoring: Secondary | ICD-10-CM

## 2015-06-20 DIAGNOSIS — Z953 Presence of xenogenic heart valve: Secondary | ICD-10-CM

## 2015-06-20 DIAGNOSIS — E785 Hyperlipidemia, unspecified: Secondary | ICD-10-CM

## 2015-06-20 DIAGNOSIS — Z954 Presence of other heart-valve replacement: Secondary | ICD-10-CM | POA: Diagnosis not present

## 2015-06-20 DIAGNOSIS — I3489 Other nonrheumatic mitral valve disorders: Secondary | ICD-10-CM

## 2015-06-20 LAB — BASIC METABOLIC PANEL
BUN: 12 mg/dL (ref 7–25)
CALCIUM: 8.9 mg/dL (ref 8.6–10.4)
CO2: 25 mmol/L (ref 20–31)
Chloride: 104 mmol/L (ref 98–110)
Creat: 0.8 mg/dL (ref 0.50–0.99)
GLUCOSE: 90 mg/dL (ref 65–99)
POTASSIUM: 4.2 mmol/L (ref 3.5–5.3)
Sodium: 139 mmol/L (ref 135–146)

## 2015-06-20 LAB — LIPID PANEL
CHOL/HDL RATIO: 2.3 ratio (ref <=5.0)
CHOLESTEROL: 136 mg/dL (ref 125–200)
HDL: 60 mg/dL (ref >=46)
LDL Cholesterol: 49 mg/dL (ref <130)
Triglycerides: 137 mg/dL (ref <150)
VLDL: 27 mg/dL (ref <30)

## 2015-06-20 LAB — HEPATIC FUNCTION PANEL
ALBUMIN: 4.2 g/dL (ref 3.6–5.1)
ALT: 24 U/L (ref 6–29)
AST: 35 U/L (ref 10–35)
Alkaline Phosphatase: 71 U/L (ref 33–130)
BILIRUBIN TOTAL: 0.5 mg/dL (ref 0.2–1.2)
Bilirubin, Direct: 0.1 mg/dL (ref <=0.2)
Indirect Bilirubin: 0.4 mg/dL (ref 0.2–1.2)
TOTAL PROTEIN: 6.7 g/dL (ref 6.1–8.1)

## 2015-06-20 LAB — POCT INR: INR: 4.4

## 2015-06-20 MED ORDER — TRIAMTERENE-HCTZ 37.5-25 MG PO TABS: 1.0000 | ORAL_TABLET | Freq: Every day | ORAL | Status: DC

## 2015-06-20 MED ORDER — ATORVASTATIN CALCIUM 20 MG PO TABS: 20.0000 mg | ORAL_TABLET | Freq: Every day | ORAL | Status: DC

## 2015-06-20 MED ORDER — METOPROLOL TARTRATE 50 MG PO TABS: 50.0000 mg | ORAL_TABLET | Freq: Every day | ORAL | Status: DC

## 2015-06-20 NOTE — Patient Instructions (Signed)
Medication Instructions:  Your physician recommends that you continue on your current medications as directed. Please refer to the Current Medication list given to you today.   Labwork: LABS TODAY: BMET, CHOLESTEROL AND LIVER FUNCTION. LABS IN 6 MONTHS FOR FASTING LAB WORK: BMET, CHOLESTEROL AND LIVER FUNCTION.  Testing/Procedures: NONE   Follow-Up: Your physician recommends that you schedule a follow-up appointment in: 6 MONTHS WITH DR. Elease HashimotoNAHSER.   If you need a refill on your cardiac medications before your next appointment, please call your pharmacy.

## 2015-06-20 NOTE — Progress Notes (Signed)
Natasha Davis Date of Birth  1955-07-06 [facility] Natasha Davis     [place] Office  [address]    [address]   [address] [place], [place]  [address]    [place], [place]  [address] [phone]  Fax  [phone]  [phone]  Fax [phone]   History of Present Illness:  Natasha Davis is a 60 year old female.  1.  Mitral stenosis 2.  Status post mitral valve  placement 3 . Chronic anticoagulation 4.  Hypertension 5.  History of CVA ( prior to being diagnoses with mitral stenosis) 6.  History of claudication 7.  Cigarette smoking  Natasha Davis has done well since I last saw Natasha Davis in December, 2011.  She's not having episodes of chest pain or shortness breath. She's been on Coumadin and Natasha Davis protime levels have been therapeutic.  She quit smoking but started back again.  May 04, 2012  Natasha Davis is doing well from a cardiac standpoint.   She is still smoking.   Jun 20, 2015:  Natasha BeltonDianne is seen for a follow up visit  - missed last years office visit.  Has been keeping Natasha Davis INR appts.  No cardiac issues.  BP and HR are good,  Still smoking    Current Outpatient Prescriptions on File Prior to Visit  Medication Sig Dispense Refill  . acetaminophen (TYLENOL ARTHRITIS PAIN) 650 MG CR tablet Take 650 mg by mouth every 8 (eight) hours as needed. Pain     . atorvastatin (LIPITOR) 20 MG tablet Take 1 tablet (20 mg total) by mouth daily. 90 tablet 3  . levETIRAcetam (KEPPRA) 500 MG tablet Take 500 mg by mouth daily.    . metoprolol (LOPRESSOR) 50 MG tablet Take 1 tablet (50 mg total) by mouth daily. 90 tablet 3  . triamterene-hydrochlorothiazide (MAXZIDE-25) 37.5-25 MG tablet Take 1 tablet by mouth daily. Please keep 06/20/15 appointment for further refills 30 tablet 0  . warfarin (COUMADIN) 5 MG tablet Take as directed by Coumadin clinic 180 tablet 1  . warfarin (COUMADIN) 5 MG tablet TAKE 1 & 1/2 TABLETS DAILY - EXCEPT ON MON, WED, AND FRI TAKE 2 TABS - OR AS DIRECTED BY PHYSICIAN 160 tablet  0   No current facility-administered medications on file prior to visit.    Allergies  Allergen Reactions  . Penicillins     Past Medical History  Diagnosis Date  . Mitral stenosis   . Chronic anticoagulation   . Hypertension   . History of CVA (cerebrovascular accident)   . Claudication (HCC)   . Seizure (HCC)     HX    Past Surgical History  Procedure Laterality Date  . Cardiac catheterization  06/01/96    NORMAL LEFT VENTRICULAR SYSTOLIC FUNCTION. EF 60%  . Mitral valve replacement    . Cesarean section      X2    History  Smoking status  . Current Every Day Smoker -- 1.00 packs/day  . Types: Cigarettes  . Last Attempt to Quit: 01/25/2006  Smokeless tobacco  . Not on file    History  Alcohol Use No    Family History  Problem Relation Age of Onset  . Hypertension Mother   . Heart disease Father   . Hypertension Father   . Heart attack Father   . Stroke Brother     Reviw of Systems:  Reviewed in the HPI.  All other systems are negative.  Physical Exam: BP 128/80 mmHg  Pulse 75  Ht 4\' 11"  (1.499  m)  Wt 198 lb 12.8 oz (90.175 kg)  BMI 40.13 kg/m2 The patient is alert and oriented x 3.  The mood and affect are normal.   Skin: warm and dry.  Color is normal.    HEENT:   [place]/AT, normal carotids, no JVD  Lungs: clear  Heart: RR, mechinical S1, normal S2     Abdomen: She is moderately obese. She has good bowel sounds but is no hepatosplenomegaly.  Extremities:  No clubbing cyanosis or edema.  Neuro:  Exam is nonfocal. Gait is normal    ECG: Jun 20, 2015:   NAR at 31.   Normal ECG Assessment / Plan:   1.  Mitral stenosis -  Status post mitral valve  placement 3 . Chronic anticoagulation 4.  Hypertension - BP is well controlled.  5.  History of CVA ( prior to being diagnoses with mitral stenosis) 6.  History of claudication 7.  Cigarette smoking    Natasha Miss, MD  06/20/2015 11:38 AM    [facility] [address] [place],  [address] [place], [place]  [address] Pager [phone] Phone: [phone]; Fax: [phone]

## 2015-07-11 ENCOUNTER — Ambulatory Visit (INDEPENDENT_AMBULATORY_CARE_PROVIDER_SITE_OTHER): Payer: Medicare Other | Admitting: *Deleted

## 2015-07-11 DIAGNOSIS — Z954 Presence of other heart-valve replacement: Secondary | ICD-10-CM | POA: Diagnosis not present

## 2015-07-11 DIAGNOSIS — I059 Rheumatic mitral valve disease, unspecified: Secondary | ICD-10-CM | POA: Diagnosis not present

## 2015-07-11 DIAGNOSIS — Z5181 Encounter for therapeutic drug level monitoring: Secondary | ICD-10-CM

## 2015-07-11 DIAGNOSIS — Z952 Presence of prosthetic heart valve: Secondary | ICD-10-CM

## 2015-07-11 LAB — POCT INR: INR: 3.3

## 2015-08-22 ENCOUNTER — Ambulatory Visit (INDEPENDENT_AMBULATORY_CARE_PROVIDER_SITE_OTHER): Payer: Medicare Other | Admitting: *Deleted

## 2015-08-22 DIAGNOSIS — Z954 Presence of other heart-valve replacement: Secondary | ICD-10-CM

## 2015-08-22 DIAGNOSIS — Z952 Presence of prosthetic heart valve: Secondary | ICD-10-CM

## 2015-08-22 DIAGNOSIS — Z5181 Encounter for therapeutic drug level monitoring: Secondary | ICD-10-CM | POA: Diagnosis not present

## 2015-08-22 DIAGNOSIS — I059 Rheumatic mitral valve disease, unspecified: Secondary | ICD-10-CM | POA: Diagnosis not present

## 2015-08-22 LAB — POCT INR: INR: 3.7

## 2015-09-12 ENCOUNTER — Ambulatory Visit (INDEPENDENT_AMBULATORY_CARE_PROVIDER_SITE_OTHER): Payer: Medicare Other | Admitting: Pharmacist

## 2015-09-12 DIAGNOSIS — I059 Rheumatic mitral valve disease, unspecified: Secondary | ICD-10-CM | POA: Diagnosis not present

## 2015-09-12 DIAGNOSIS — Z5181 Encounter for therapeutic drug level monitoring: Secondary | ICD-10-CM

## 2015-09-12 DIAGNOSIS — Z952 Presence of prosthetic heart valve: Secondary | ICD-10-CM

## 2015-09-12 DIAGNOSIS — Z954 Presence of other heart-valve replacement: Secondary | ICD-10-CM

## 2015-09-12 LAB — POCT INR: INR: 2.9

## 2015-10-17 ENCOUNTER — Ambulatory Visit (INDEPENDENT_AMBULATORY_CARE_PROVIDER_SITE_OTHER): Payer: Medicare Other | Admitting: *Deleted

## 2015-10-17 DIAGNOSIS — Z5181 Encounter for therapeutic drug level monitoring: Secondary | ICD-10-CM

## 2015-10-17 DIAGNOSIS — I059 Rheumatic mitral valve disease, unspecified: Secondary | ICD-10-CM

## 2015-10-17 DIAGNOSIS — Z952 Presence of prosthetic heart valve: Secondary | ICD-10-CM

## 2015-10-17 LAB — POCT INR: INR: 5.3

## 2015-10-31 ENCOUNTER — Ambulatory Visit (INDEPENDENT_AMBULATORY_CARE_PROVIDER_SITE_OTHER): Payer: Medicare Other

## 2015-10-31 DIAGNOSIS — Z5181 Encounter for therapeutic drug level monitoring: Secondary | ICD-10-CM | POA: Diagnosis not present

## 2015-10-31 DIAGNOSIS — I059 Rheumatic mitral valve disease, unspecified: Secondary | ICD-10-CM

## 2015-10-31 DIAGNOSIS — Z952 Presence of prosthetic heart valve: Secondary | ICD-10-CM

## 2015-10-31 LAB — POCT INR: INR: 4.1

## 2015-11-14 ENCOUNTER — Ambulatory Visit (INDEPENDENT_AMBULATORY_CARE_PROVIDER_SITE_OTHER): Payer: Medicare Other | Admitting: *Deleted

## 2015-11-14 DIAGNOSIS — I059 Rheumatic mitral valve disease, unspecified: Secondary | ICD-10-CM | POA: Diagnosis not present

## 2015-11-14 DIAGNOSIS — Z5181 Encounter for therapeutic drug level monitoring: Secondary | ICD-10-CM

## 2015-11-14 DIAGNOSIS — Z952 Presence of prosthetic heart valve: Secondary | ICD-10-CM | POA: Diagnosis not present

## 2015-11-14 LAB — POCT INR: INR: 2.7

## 2015-12-05 ENCOUNTER — Ambulatory Visit (INDEPENDENT_AMBULATORY_CARE_PROVIDER_SITE_OTHER): Payer: Medicare Other | Admitting: *Deleted

## 2015-12-05 DIAGNOSIS — Z952 Presence of prosthetic heart valve: Secondary | ICD-10-CM | POA: Diagnosis not present

## 2015-12-05 DIAGNOSIS — I059 Rheumatic mitral valve disease, unspecified: Secondary | ICD-10-CM

## 2015-12-05 DIAGNOSIS — Z5181 Encounter for therapeutic drug level monitoring: Secondary | ICD-10-CM | POA: Diagnosis not present

## 2015-12-05 LAB — POCT INR: INR: 2.3

## 2016-01-02 ENCOUNTER — Ambulatory Visit: Payer: Medicare Other | Admitting: Cardiovascular Disease

## 2016-01-09 ENCOUNTER — Ambulatory Visit (INDEPENDENT_AMBULATORY_CARE_PROVIDER_SITE_OTHER): Payer: Medicare Other | Admitting: Pharmacist Clinician (PhC)/ Clinical Pharmacy Specialist

## 2016-01-09 DIAGNOSIS — Z5181 Encounter for therapeutic drug level monitoring: Secondary | ICD-10-CM

## 2016-01-09 DIAGNOSIS — Z952 Presence of prosthetic heart valve: Secondary | ICD-10-CM

## 2016-01-09 DIAGNOSIS — I059 Rheumatic mitral valve disease, unspecified: Secondary | ICD-10-CM

## 2016-01-09 LAB — POCT INR: INR: 3.2

## 2016-02-12 ENCOUNTER — Ambulatory Visit: Payer: Medicare Other | Admitting: Cardiovascular Disease

## 2016-02-20 ENCOUNTER — Ambulatory Visit (INDEPENDENT_AMBULATORY_CARE_PROVIDER_SITE_OTHER): Payer: Medicare Other

## 2016-02-20 DIAGNOSIS — Z952 Presence of prosthetic heart valve: Secondary | ICD-10-CM | POA: Diagnosis not present

## 2016-02-20 DIAGNOSIS — Z5181 Encounter for therapeutic drug level monitoring: Secondary | ICD-10-CM

## 2016-02-20 DIAGNOSIS — I059 Rheumatic mitral valve disease, unspecified: Secondary | ICD-10-CM | POA: Diagnosis not present

## 2016-02-20 LAB — POCT INR: INR: 2.7

## 2016-02-27 DIAGNOSIS — R52 Pain, unspecified: Secondary | ICD-10-CM | POA: Diagnosis not present

## 2016-02-27 DIAGNOSIS — J029 Acute pharyngitis, unspecified: Secondary | ICD-10-CM | POA: Diagnosis not present

## 2016-03-25 NOTE — Progress Notes (Signed)
Natasha Davis Date of Birth  1955/07/18   [address]    [address]    [place], [place]  [address]       History of Present Illness:  Natasha Davis is a 61 year old female.  1.  Mitral stenosis 2.  Status post mitral valve  placement 3 . Chronic anticoagulation 4.  Hypertension 5.  History of CVA ( prior to being diagnoses with mitral stenosis) 6.  History of claudication 7.  Cigarette smoking  Natasha Davis has done well since I last saw her in December, 2011.  She's not having episodes of chest pain or shortness breath. She's been on Coumadin and her protime levels have been therapeutic.  She quit smoking but started back again.  May 04, 2012  Natasha Davis is doing well from a cardiac standpoint.   She is still smoking.   Jun 20, 2015:  Natasha Davis is seen for a follow up visit  - missed last years office visit.  Has been keeping her INR appts.  No cardiac issues.  BP and HR are good,  Still smoking   March 26, 2016: Natasha Davis is seen today for a follow up visit Has started back smoking    Current Outpatient Prescriptions on File Prior to Visit  Medication Sig Dispense Refill  . acetaminophen (TYLENOL ARTHRITIS PAIN) 650 MG CR tablet Take 650 mg by mouth every 8 (eight) hours as needed. Pain     . atorvastatin (LIPITOR) 20 MG tablet Take 1 tablet (20 mg total) by mouth daily. 90 tablet 3  . levETIRAcetam (KEPPRA) 500 MG tablet Take 500 mg by mouth daily.    . metoprolol (LOPRESSOR) 50 MG tablet Take 1 tablet (50 mg total) by mouth daily. 90 tablet 3  . triamterene-hydrochlorothiazide (MAXZIDE-25) 37.5-25 MG tablet Take 1 tablet by mouth daily. Please keep 06/20/15 appointment for further refills 90 tablet 3  . warfarin (COUMADIN) 5 MG tablet Take as directed by Coumadin clinic 180 tablet 1  . warfarin (COUMADIN) 5 MG tablet TAKE 1 & 1/2 TABLETS DAILY - EXCEPT ON MON, WED, AND FRI TAKE 2 TABS - OR AS DIRECTED BY PHYSICIAN 160 tablet 0   No current facility-administered medications on  file prior to visit.     Allergies  Allergen Reactions  . Penicillins     Past Medical History:  Diagnosis Date  . Chronic anticoagulation   . Claudication (HCC)   . History of CVA (cerebrovascular accident)   . Hypertension   . Mitral stenosis   . Seizure (HCC)    HX    Past Surgical History:  Procedure Laterality Date  . CARDIAC CATHETERIZATION  06/01/96   NORMAL LEFT VENTRICULAR SYSTOLIC FUNCTION. EF 60%  . CESAREAN SECTION     X2  . MITRAL VALVE REPLACEMENT      History  Smoking Status  . Current Every Day Smoker  . Packs/day: 1.00  . Types: Cigarettes  . Last attempt to quit: 01/25/2006  Smokeless Tobacco  . Not on file    History  Alcohol Use No    Family History  Problem Relation Age of Onset  . Hypertension Mother   . Heart disease Father   . Hypertension Father   . Heart attack Father   . Stroke Brother     Reviw of Systems:  Reviewed in the HPI.  All other systems are negative.  Physical Exam: There were no vitals taken for this visit. The patient is alert and oriented x 3.  The mood and affect are normal.   Skin: warm and dry.  Color is normal.    HEENT:   [place]/AT, normal carotids, no JVD  Lungs: clear  Heart: RR, mechinical S1, normal S2     Abdomen: She is moderately obese. She has good bowel sounds but is no hepatosplenomegaly.  Extremities:  No clubbing cyanosis or edema.  Neuro:  Exam is nonfocal. Gait is normal    ECG:  March 26, 2016:   NSR at 72.   Normal  Assessment / Plan:   1.  Mitral stenosis -  Status post mitral valve  placement 3 . Chronic anticoagulation - INR levels are theraputic  4.  Hypertension - BP is well controlled.  5.  History of CVA ( prior to being diagnoses with mitral stenosis) 6.  History of claudication 7.  Cigarette smoking - advised cessation     Kristeen MissPhilip Nahser, MD  03/25/2016 7:48 PM    [facility] [address],  [address] [place], [place]  [address] Pager [phone]  [phone] Phone: [phone]; Fax: [phone]

## 2016-03-26 ENCOUNTER — Encounter: Payer: Self-pay | Admitting: Cardiovascular Disease

## 2016-03-26 ENCOUNTER — Ambulatory Visit: Payer: Medicare Other | Admitting: Cardiovascular Disease

## 2016-03-26 ENCOUNTER — Encounter (INDEPENDENT_AMBULATORY_CARE_PROVIDER_SITE_OTHER): Payer: Self-pay

## 2016-03-26 ENCOUNTER — Ambulatory Visit (INDEPENDENT_AMBULATORY_CARE_PROVIDER_SITE_OTHER): Payer: Medicare Other | Admitting: *Deleted

## 2016-03-26 ENCOUNTER — Ambulatory Visit (INDEPENDENT_AMBULATORY_CARE_PROVIDER_SITE_OTHER): Payer: Medicare Other | Admitting: Cardiovascular Disease

## 2016-03-26 VITALS — BP 130/90 | HR 72 | Ht 59.0 in | Wt 198.4 lb

## 2016-03-26 DIAGNOSIS — Z952 Presence of prosthetic heart valve: Secondary | ICD-10-CM

## 2016-03-26 DIAGNOSIS — E782 Mixed hyperlipidemia: Secondary | ICD-10-CM | POA: Diagnosis not present

## 2016-03-26 DIAGNOSIS — I10 Essential (primary) hypertension: Secondary | ICD-10-CM | POA: Diagnosis not present

## 2016-03-26 DIAGNOSIS — I059 Rheumatic mitral valve disease, unspecified: Secondary | ICD-10-CM | POA: Diagnosis not present

## 2016-03-26 DIAGNOSIS — Z5181 Encounter for therapeutic drug level monitoring: Secondary | ICD-10-CM

## 2016-03-26 LAB — POCT INR: INR: 3.4

## 2016-03-26 NOTE — Patient Instructions (Signed)
Medication Instructions:  Your physician recommends that you continue on your current medications as directed. Please refer to the Current Medication list given to you today.   Labwork: TODAY - cholesterol, complete metabolic panel   Testing/Procedures: None Ordered   Follow-Up: Your physician wants you to follow-up in: 1 year with Dr. Nahser.  You will receive a reminder letter in the mail two months in advance. If you don't receive a letter, please call our office to schedule the follow-up appointment.   If you need a refill on your cardiac medications before your next appointment, please call your pharmacy.   Thank you for choosing [facility]! Jourdon Zimmerle, RN [phone]    

## 2016-03-27 LAB — COMPREHENSIVE METABOLIC PANEL
A/G RATIO: 1.8 (ref 1.2–2.2)
ALBUMIN: 4.4 g/dL (ref 3.6–4.8)
ALT: 18 IU/L (ref 0–32)
AST: 32 IU/L (ref 0–40)
Alkaline Phosphatase: 90 IU/L (ref 39–117)
BILIRUBIN TOTAL: 0.4 mg/dL (ref 0.0–1.2)
BUN / CREAT RATIO: 17 (ref 12–28)
BUN: 12 mg/dL (ref 8–27)
CALCIUM: 9.4 mg/dL (ref 8.7–10.3)
CHLORIDE: 98 mmol/L (ref 96–106)
CO2: 25 mmol/L (ref 18–29)
Creatinine, Ser: 0.71 mg/dL (ref 0.57–1.00)
GFR, EST AFRICAN AMERICAN: 106 mL/min/{1.73_m2} (ref >59)
GFR, EST NON AFRICAN AMERICAN: 92 mL/min/{1.73_m2} (ref >59)
GLOBULIN, TOTAL: 2.5 g/dL (ref 1.5–4.5)
Glucose: 89 mg/dL (ref 65–99)
POTASSIUM: 3.9 mmol/L (ref 3.5–5.2)
SODIUM: 139 mmol/L (ref 134–144)
TOTAL PROTEIN: 6.9 g/dL (ref 6.0–8.5)

## 2016-03-27 LAB — LIPID PANEL
CHOL/HDL RATIO: 2.9 ratio (ref 0.0–4.4)
CHOLESTEROL TOTAL: 146 mg/dL (ref 100–199)
HDL: 50 mg/dL (ref >39)
LDL Calculated: 77 mg/dL (ref 0–99)
TRIGLYCERIDES: 95 mg/dL (ref 0–149)
VLDL Cholesterol Cal: 19 mg/dL (ref 5–40)

## 2016-05-07 ENCOUNTER — Ambulatory Visit (INDEPENDENT_AMBULATORY_CARE_PROVIDER_SITE_OTHER): Payer: Medicare Other

## 2016-05-07 DIAGNOSIS — I059 Rheumatic mitral valve disease, unspecified: Secondary | ICD-10-CM

## 2016-05-07 DIAGNOSIS — Z5181 Encounter for therapeutic drug level monitoring: Secondary | ICD-10-CM

## 2016-05-07 DIAGNOSIS — Z952 Presence of prosthetic heart valve: Secondary | ICD-10-CM | POA: Diagnosis not present

## 2016-05-07 LAB — POCT INR: INR: 2.5

## 2016-06-25 ENCOUNTER — Ambulatory Visit (INDEPENDENT_AMBULATORY_CARE_PROVIDER_SITE_OTHER): Payer: Medicare Other

## 2016-06-25 DIAGNOSIS — Z5181 Encounter for therapeutic drug level monitoring: Secondary | ICD-10-CM | POA: Diagnosis not present

## 2016-06-25 DIAGNOSIS — Z952 Presence of prosthetic heart valve: Secondary | ICD-10-CM

## 2016-06-25 DIAGNOSIS — I059 Rheumatic mitral valve disease, unspecified: Secondary | ICD-10-CM | POA: Diagnosis not present

## 2016-06-25 LAB — POCT INR: INR: 3

## 2016-08-05 DIAGNOSIS — Z23 Encounter for immunization: Secondary | ICD-10-CM | POA: Diagnosis not present

## 2016-08-05 DIAGNOSIS — Z1389 Encounter for screening for other disorder: Secondary | ICD-10-CM | POA: Diagnosis not present

## 2016-08-05 DIAGNOSIS — E782 Mixed hyperlipidemia: Secondary | ICD-10-CM | POA: Diagnosis not present

## 2016-08-05 DIAGNOSIS — I10 Essential (primary) hypertension: Secondary | ICD-10-CM | POA: Diagnosis not present

## 2016-08-05 DIAGNOSIS — I059 Rheumatic mitral valve disease, unspecified: Secondary | ICD-10-CM | POA: Diagnosis not present

## 2016-08-05 DIAGNOSIS — I6350 Cerebral infarction due to unspecified occlusion or stenosis of unspecified cerebral artery: Secondary | ICD-10-CM | POA: Diagnosis not present

## 2016-08-15 ENCOUNTER — Other Ambulatory Visit: Payer: Self-pay | Admitting: Cardiovascular Disease

## 2016-08-15 DIAGNOSIS — I3489 Other nonrheumatic mitral valve disorders: Secondary | ICD-10-CM

## 2016-08-15 DIAGNOSIS — I348 Other nonrheumatic mitral valve disorders: Secondary | ICD-10-CM

## 2016-08-15 DIAGNOSIS — I10 Essential (primary) hypertension: Secondary | ICD-10-CM

## 2016-08-20 ENCOUNTER — Ambulatory Visit (INDEPENDENT_AMBULATORY_CARE_PROVIDER_SITE_OTHER): Payer: Medicare Other | Admitting: *Deleted

## 2016-08-20 DIAGNOSIS — Z952 Presence of prosthetic heart valve: Secondary | ICD-10-CM

## 2016-08-20 DIAGNOSIS — Z5181 Encounter for therapeutic drug level monitoring: Secondary | ICD-10-CM

## 2016-08-20 DIAGNOSIS — I059 Rheumatic mitral valve disease, unspecified: Secondary | ICD-10-CM | POA: Diagnosis not present

## 2016-08-20 LAB — POCT INR: INR: 3.5

## 2016-10-15 ENCOUNTER — Ambulatory Visit (INDEPENDENT_AMBULATORY_CARE_PROVIDER_SITE_OTHER): Payer: Medicare Other | Admitting: *Deleted

## 2016-10-15 DIAGNOSIS — I059 Rheumatic mitral valve disease, unspecified: Secondary | ICD-10-CM | POA: Diagnosis not present

## 2016-10-15 DIAGNOSIS — Z952 Presence of prosthetic heart valve: Secondary | ICD-10-CM | POA: Diagnosis not present

## 2016-10-15 DIAGNOSIS — Z5181 Encounter for therapeutic drug level monitoring: Secondary | ICD-10-CM | POA: Diagnosis not present

## 2016-10-15 LAB — POCT INR: INR: 3.4

## 2016-11-25 ENCOUNTER — Other Ambulatory Visit: Payer: Self-pay | Admitting: Cardiovascular Disease

## 2016-11-25 DIAGNOSIS — I10 Essential (primary) hypertension: Secondary | ICD-10-CM

## 2016-11-25 DIAGNOSIS — I348 Other nonrheumatic mitral valve disorders: Secondary | ICD-10-CM

## 2016-11-25 DIAGNOSIS — I3489 Other nonrheumatic mitral valve disorders: Secondary | ICD-10-CM

## 2016-12-15 ENCOUNTER — Ambulatory Visit (INDEPENDENT_AMBULATORY_CARE_PROVIDER_SITE_OTHER): Payer: Medicare Other | Admitting: *Deleted

## 2016-12-15 ENCOUNTER — Encounter (INDEPENDENT_AMBULATORY_CARE_PROVIDER_SITE_OTHER): Payer: Self-pay

## 2016-12-15 DIAGNOSIS — Z952 Presence of prosthetic heart valve: Secondary | ICD-10-CM | POA: Diagnosis not present

## 2016-12-15 DIAGNOSIS — I059 Rheumatic mitral valve disease, unspecified: Secondary | ICD-10-CM | POA: Diagnosis not present

## 2016-12-15 DIAGNOSIS — Z5181 Encounter for therapeutic drug level monitoring: Secondary | ICD-10-CM

## 2016-12-15 LAB — POCT INR: INR: 3

## 2016-12-15 NOTE — Patient Instructions (Addendum)
Continue on same dosage 1.5 tablets everyday except 2 tablets on Mondays and Fridays. Make sure you are consistent with your green vegetables.  Recheck in 6 weeks.  # [phone]  Coumadin clinic

## 2017-01-31 ENCOUNTER — Ambulatory Visit (INDEPENDENT_AMBULATORY_CARE_PROVIDER_SITE_OTHER): Payer: Medicare Other | Admitting: *Deleted

## 2017-01-31 DIAGNOSIS — Z952 Presence of prosthetic heart valve: Secondary | ICD-10-CM

## 2017-01-31 DIAGNOSIS — Z5181 Encounter for therapeutic drug level monitoring: Secondary | ICD-10-CM | POA: Diagnosis not present

## 2017-01-31 DIAGNOSIS — I059 Rheumatic mitral valve disease, unspecified: Secondary | ICD-10-CM

## 2017-01-31 LAB — POCT INR: INR: 2.5

## 2017-01-31 NOTE — Patient Instructions (Signed)
Description   Continue on same dosage 1.5 tablets everyday except 2 tablets on Mondays and Fridays. Make sure you are consistent with your green vegetables.  Recheck in 6 weeks.  # [phone] Coumadin Clinic.

## 2017-02-22 ENCOUNTER — Other Ambulatory Visit: Payer: Self-pay | Admitting: Cardiovascular Disease

## 2017-02-22 DIAGNOSIS — I348 Other nonrheumatic mitral valve disorders: Secondary | ICD-10-CM

## 2017-02-22 DIAGNOSIS — I3489 Other nonrheumatic mitral valve disorders: Secondary | ICD-10-CM

## 2017-02-22 DIAGNOSIS — I10 Essential (primary) hypertension: Secondary | ICD-10-CM

## 2017-03-14 ENCOUNTER — Ambulatory Visit (INDEPENDENT_AMBULATORY_CARE_PROVIDER_SITE_OTHER): Payer: Medicare Other | Admitting: *Deleted

## 2017-03-14 DIAGNOSIS — Z5181 Encounter for therapeutic drug level monitoring: Secondary | ICD-10-CM

## 2017-03-14 DIAGNOSIS — I059 Rheumatic mitral valve disease, unspecified: Secondary | ICD-10-CM | POA: Diagnosis not present

## 2017-03-14 DIAGNOSIS — Z952 Presence of prosthetic heart valve: Secondary | ICD-10-CM | POA: Diagnosis not present

## 2017-03-14 LAB — POCT INR: INR: 2.2

## 2017-03-14 NOTE — Patient Instructions (Signed)
Description   Today Feb 18th take 2 and 1/2 tablets (12.5mg ) then continue on same dosage 1 and 1/2 tablets everyday except 2 tablets on Mondays and Fridays. Make sure you are consistent with your green vegetables.  Recheck in 2 weeks.  # [phone] Coumadin Clinic.

## 2017-04-01 ENCOUNTER — Ambulatory Visit (INDEPENDENT_AMBULATORY_CARE_PROVIDER_SITE_OTHER): Payer: Medicare Other | Admitting: *Deleted

## 2017-04-01 DIAGNOSIS — I059 Rheumatic mitral valve disease, unspecified: Secondary | ICD-10-CM

## 2017-04-01 DIAGNOSIS — Z5181 Encounter for therapeutic drug level monitoring: Secondary | ICD-10-CM

## 2017-04-01 DIAGNOSIS — Z952 Presence of prosthetic heart valve: Secondary | ICD-10-CM | POA: Diagnosis not present

## 2017-04-01 LAB — POCT INR: INR: 2.7

## 2017-04-01 NOTE — Patient Instructions (Signed)
Description   Continue on same dosage 1 and 1/2 tablets everyday except 2 tablets on Mondays and Fridays. Make sure you are consistent with your green vegetables.  Recheck in 4 weeks.  # [phone] Coumadin Clinic.      

## 2017-05-06 ENCOUNTER — Ambulatory Visit (INDEPENDENT_AMBULATORY_CARE_PROVIDER_SITE_OTHER): Payer: Medicare Other | Admitting: *Deleted

## 2017-05-06 DIAGNOSIS — Z5181 Encounter for therapeutic drug level monitoring: Secondary | ICD-10-CM

## 2017-05-06 DIAGNOSIS — Z952 Presence of prosthetic heart valve: Secondary | ICD-10-CM

## 2017-05-06 DIAGNOSIS — I059 Rheumatic mitral valve disease, unspecified: Secondary | ICD-10-CM

## 2017-05-06 LAB — POCT INR: INR: 1.9

## 2017-05-06 NOTE — Patient Instructions (Signed)
Description   Today April 12th  take 2 and 1/2 tablets then tomorrow April 13th take 2 tablets then continue on same dosage 1 and 1/2 tablets everyday except 2 tablets on Mondays and Fridays. Make sure you are consistent with your green vegetables.  Recheck in 2 weeks.  # [phone] Coumadin Clinic.

## 2017-05-14 ENCOUNTER — Other Ambulatory Visit: Payer: Self-pay | Admitting: Cardiovascular Disease

## 2017-05-14 DIAGNOSIS — I348 Other nonrheumatic mitral valve disorders: Secondary | ICD-10-CM

## 2017-05-14 DIAGNOSIS — I3489 Other nonrheumatic mitral valve disorders: Secondary | ICD-10-CM

## 2017-05-14 DIAGNOSIS — I10 Essential (primary) hypertension: Secondary | ICD-10-CM

## 2017-05-19 ENCOUNTER — Other Ambulatory Visit: Payer: Self-pay | Admitting: Cardiovascular Disease

## 2017-05-19 DIAGNOSIS — I10 Essential (primary) hypertension: Secondary | ICD-10-CM

## 2017-05-19 DIAGNOSIS — I348 Other nonrheumatic mitral valve disorders: Secondary | ICD-10-CM

## 2017-05-19 DIAGNOSIS — I3489 Other nonrheumatic mitral valve disorders: Secondary | ICD-10-CM

## 2017-05-20 ENCOUNTER — Ambulatory Visit (INDEPENDENT_AMBULATORY_CARE_PROVIDER_SITE_OTHER): Payer: Medicare Other | Admitting: Pharmacist

## 2017-05-20 DIAGNOSIS — Z952 Presence of prosthetic heart valve: Secondary | ICD-10-CM | POA: Diagnosis not present

## 2017-05-20 DIAGNOSIS — I059 Rheumatic mitral valve disease, unspecified: Secondary | ICD-10-CM

## 2017-05-20 DIAGNOSIS — Z5181 Encounter for therapeutic drug level monitoring: Secondary | ICD-10-CM

## 2017-05-20 LAB — POCT INR: INR: 2.6

## 2017-05-20 NOTE — Patient Instructions (Signed)
Description   Continue on same dosage 1 and 1/2 tablets everyday except 2 tablets on Mondays and Fridays. Make sure you are consistent with your green vegetables.  Recheck in 4 weeks.  # [phone] Coumadin Clinic.

## 2017-06-09 ENCOUNTER — Other Ambulatory Visit: Payer: Self-pay | Admitting: Cardiovascular Disease

## 2017-06-09 DIAGNOSIS — I10 Essential (primary) hypertension: Secondary | ICD-10-CM

## 2017-06-09 DIAGNOSIS — I348 Other nonrheumatic mitral valve disorders: Secondary | ICD-10-CM

## 2017-06-09 DIAGNOSIS — I3489 Other nonrheumatic mitral valve disorders: Secondary | ICD-10-CM

## 2017-06-16 ENCOUNTER — Other Ambulatory Visit: Payer: Self-pay | Admitting: Cardiovascular Disease

## 2017-06-16 DIAGNOSIS — I10 Essential (primary) hypertension: Secondary | ICD-10-CM

## 2017-06-16 DIAGNOSIS — I348 Other nonrheumatic mitral valve disorders: Secondary | ICD-10-CM

## 2017-06-16 DIAGNOSIS — I3489 Other nonrheumatic mitral valve disorders: Secondary | ICD-10-CM

## 2017-06-24 ENCOUNTER — Ambulatory Visit (INDEPENDENT_AMBULATORY_CARE_PROVIDER_SITE_OTHER): Payer: Medicare Other | Admitting: *Deleted

## 2017-06-24 DIAGNOSIS — Z952 Presence of prosthetic heart valve: Secondary | ICD-10-CM | POA: Diagnosis not present

## 2017-06-24 DIAGNOSIS — Z5181 Encounter for therapeutic drug level monitoring: Secondary | ICD-10-CM

## 2017-06-24 DIAGNOSIS — I059 Rheumatic mitral valve disease, unspecified: Secondary | ICD-10-CM | POA: Diagnosis not present

## 2017-06-24 LAB — POCT INR: INR: 2.5 (ref 2.0–3.0)

## 2017-06-24 NOTE — Patient Instructions (Signed)
Description   Continue on same dosage 1 and 1/2 tablets everyday except 2 tablets on Mondays and Fridays. Make sure you are consistent with your green vegetables.  Recheck in 4 weeks.  # [phone] Coumadin Clinic.

## 2017-07-22 ENCOUNTER — Ambulatory Visit (INDEPENDENT_AMBULATORY_CARE_PROVIDER_SITE_OTHER): Payer: Medicare Other | Admitting: *Deleted

## 2017-07-22 DIAGNOSIS — Z5181 Encounter for therapeutic drug level monitoring: Secondary | ICD-10-CM | POA: Diagnosis not present

## 2017-07-22 DIAGNOSIS — Z952 Presence of prosthetic heart valve: Secondary | ICD-10-CM | POA: Diagnosis not present

## 2017-07-22 LAB — POCT INR: INR: 2.8 (ref 2.0–3.0)

## 2017-07-22 NOTE — Patient Instructions (Signed)
Description   Continue on same dosage 1 and 1/2 tablets everyday except 2 tablets on Mondays and Fridays. Make sure you are consistent with your green vegetables.  Recheck in 6 weeks.  # [phone] Coumadin Clinic.

## 2017-09-02 ENCOUNTER — Ambulatory Visit: Payer: Medicare Other | Admitting: *Deleted

## 2017-09-02 ENCOUNTER — Encounter (INDEPENDENT_AMBULATORY_CARE_PROVIDER_SITE_OTHER): Payer: Self-pay

## 2017-09-02 DIAGNOSIS — Z952 Presence of prosthetic heart valve: Secondary | ICD-10-CM | POA: Diagnosis not present

## 2017-09-02 DIAGNOSIS — Z5181 Encounter for therapeutic drug level monitoring: Secondary | ICD-10-CM | POA: Diagnosis not present

## 2017-09-02 LAB — POCT INR: INR: 3.3 — AB (ref 2.0–3.0)

## 2017-09-02 NOTE — Patient Instructions (Signed)
Description   Continue on same dosage 1 and 1/2 tablets everyday except 2 tablets on Mondays and Fridays. Make sure you are consistent with your green vegetables.  Recheck in 3 weeks with MD appt. Call us   # [phone] Coumadin Clinic with any medication changes or concerns.

## 2017-09-07 ENCOUNTER — Other Ambulatory Visit: Payer: Self-pay | Admitting: Cardiovascular Disease

## 2017-09-07 DIAGNOSIS — I10 Essential (primary) hypertension: Secondary | ICD-10-CM

## 2017-09-07 DIAGNOSIS — I348 Other nonrheumatic mitral valve disorders: Secondary | ICD-10-CM

## 2017-09-07 DIAGNOSIS — I3489 Other nonrheumatic mitral valve disorders: Secondary | ICD-10-CM

## 2017-09-17 ENCOUNTER — Other Ambulatory Visit: Payer: Self-pay | Admitting: Cardiovascular Disease

## 2017-09-17 DIAGNOSIS — I3489 Other nonrheumatic mitral valve disorders: Secondary | ICD-10-CM

## 2017-09-17 DIAGNOSIS — I10 Essential (primary) hypertension: Secondary | ICD-10-CM

## 2017-09-17 DIAGNOSIS — I348 Other nonrheumatic mitral valve disorders: Secondary | ICD-10-CM

## 2017-09-20 ENCOUNTER — Other Ambulatory Visit: Payer: Self-pay | Admitting: Cardiovascular Disease

## 2017-09-20 DIAGNOSIS — I348 Other nonrheumatic mitral valve disorders: Secondary | ICD-10-CM

## 2017-09-20 DIAGNOSIS — I3489 Other nonrheumatic mitral valve disorders: Secondary | ICD-10-CM

## 2017-09-20 DIAGNOSIS — I10 Essential (primary) hypertension: Secondary | ICD-10-CM

## 2017-09-22 ENCOUNTER — Ambulatory Visit (INDEPENDENT_AMBULATORY_CARE_PROVIDER_SITE_OTHER): Payer: Medicare Other

## 2017-09-22 ENCOUNTER — Encounter: Payer: Self-pay | Admitting: Cardiovascular Disease

## 2017-09-22 ENCOUNTER — Ambulatory Visit: Payer: Medicare Other | Admitting: Cardiovascular Disease

## 2017-09-22 VITALS — BP 132/72 | HR 88 | Ht 59.0 in | Wt 185.0 lb

## 2017-09-22 DIAGNOSIS — I3489 Other nonrheumatic mitral valve disorders: Secondary | ICD-10-CM

## 2017-09-22 DIAGNOSIS — Z5181 Encounter for therapeutic drug level monitoring: Secondary | ICD-10-CM

## 2017-09-22 DIAGNOSIS — Z952 Presence of prosthetic heart valve: Secondary | ICD-10-CM

## 2017-09-22 DIAGNOSIS — I348 Other nonrheumatic mitral valve disorders: Secondary | ICD-10-CM

## 2017-09-22 DIAGNOSIS — I10 Essential (primary) hypertension: Secondary | ICD-10-CM | POA: Diagnosis not present

## 2017-09-22 LAB — POCT INR: INR: 1.9 — AB (ref 2.0–3.0)

## 2017-09-22 MED ORDER — ATORVASTATIN CALCIUM 20 MG PO TABS: 20.0000 mg | ORAL_TABLET | Freq: Every day | ORAL | 0 refills | Status: DC

## 2017-09-22 MED ORDER — METOPROLOL TARTRATE 50 MG PO TABS: 50.0000 mg | ORAL_TABLET | Freq: Every day | ORAL | 3 refills | Status: DC

## 2017-09-22 MED ORDER — TRIAMTERENE-HCTZ 37.5-25 MG PO TABS: 1.0000 | ORAL_TABLET | Freq: Every day | ORAL | 3 refills | Status: DC

## 2017-09-22 NOTE — Progress Notes (Signed)
Shakinah Aletta Edouard Date of Birth  May 13, 1955   [address]    [address]    [place], [place]  [address]       Ziyah is a 62 year old female.  1.  Mitral stenosis 2.  Status post mitral valve  placement 3 . Chronic anticoagulation 4.  Hypertension 5.  History of CVA ( prior to being diagnoses with mitral stenosis) 6.  History of claudication 7.  Cigarette smoking  Akshita has done well since I last saw her in December, 2011.  She's not having episodes of chest pain or shortness breath. She's been on Coumadin and her protime levels have been therapeutic.  She quit smoking but started back again.  May 04, 2012  Indria is doing well from a cardiac standpoint.   She is still smoking.   Jun 20, 2015:  [name] is seen for a follow up visit  - missed last years office visit.  Has been keeping her INR appts.  No cardiac issues.  BP and HR are good,  Still smoking   March 26, 2016: [name] is seen today for a follow up visit Has started back smoking   Aug. 29, 2019  Doing ok INR was 1.9 today - have been eating more salad  No CP or dyspnea Has mechanical MVR in 1998.   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  Still smokes    Current Outpatient Medications on File Prior to Visit  Medication Sig Dispense Refill  . acetaminophen (TYLENOL ARTHRITIS PAIN) 650 MG CR tablet Take 650 mg by mouth every 8 (eight) hours as needed. Pain     . atorvastatin (LIPITOR) 20 MG tablet Take 1 tablet (20 mg total) by mouth daily. 90 tablet 0  . levETIRAcetam (KEPPRA) 500 MG tablet Take 500 mg by mouth daily.    . metoprolol tartrate (LOPRESSOR) 50 MG tablet TAKE 1 TABLET BY MOUTH EVERY DAY 30 tablet 0  . triamterene-hydrochlorothiazide (MAXZIDE-25) 37.5-25 MG tablet TAKE 1 TABLET BY MOUTH EVERY DAY 30 tablet 0  . warfarin (COUMADIN) 5 MG tablet TAKE 1 & 1/2 TABLETS DAILY - EXCEPT ON MON, WED, AND FRI TAKE 2 TABS - OR AS DIRECTED BY PHYSICIAN 160 tablet 0   No current  facility-administered medications on file prior to visit.     Allergies  Allergen Reactions  . Penicillins     Past Medical History:  Diagnosis Date  . Chronic anticoagulation   . Claudication (HCC)   . History of CVA (cerebrovascular accident)   . Hypertension   . Mitral stenosis   . Seizure (HCC)    HX    Past Surgical History:  Procedure Laterality Date  . CARDIAC CATHETERIZATION  06/01/96   NORMAL LEFT VENTRICULAR SYSTOLIC FUNCTION. EF 60%  . CESAREAN SECTION     X2  . MITRAL VALVE REPLACEMENT      Social History   Tobacco Use  Smoking Status Current Every Day Smoker  . Packs/day: 1.00  . Types: Cigarettes  . Last attempt to quit: 01/25/2006  . Years since quitting: 11.6  Smokeless Tobacco Never Used    Social History   Substance and Sexual Activity  Alcohol Use No    Family History  Problem Relation Age of Onset  . Hypertension Mother   . Heart disease Father   . Hypertension Father   . Heart attack Father   . Stroke Brother     Reviw of Systems:  Reviewed in  the HPI.  All other systems are negative.  Physical Exam: Blood pressure 132/72, pulse 88, height 4\' 11"  (1.499 m), weight 185 lb (83.9 kg), SpO2 95 %.  GEN:  Middle age, obese female  HEENT: Normal NECK: No JVD; No carotid bruits LYMPHATICS: No lymphadenopathy CARDIAC: RRR , mechanical S1.  No significant murmur. RESPIRATORY:  Clear to auscultation without rales, wheezing or rhonchi  ABDOMEN: Soft, non-tender, non-distended MUSCULOSKELETAL:  No edema; No deformity  SKIN: Warm and dry NEUROLOGIC:  Alert and oriented x 3   ECG: September 22, 2017: Normal sinus rhythm at 88.  Assessment / Plan:   1.  Mitral stenosis -  Status post mitral valve  Placement -1998.  She is overall doing very well. 3 . Chronic anticoagulation - INR levels are little bit low today.  She will be increasing her warfarin.  She has been eating more salads.  She is trying to lose weight. 4.  Hypertension - BP is  well controlled.  5.  History of CVA ( prior to being diagnoses with mitral stenosis) -no further strokes.  Continue Coumadin. 6.  History of claudication 7.  Cigarette smoking -smoking.  I have advised cessation.    Kristeen MissPhilip Nahser, MD  09/22/2017 3:45 PM    [facility] [address]  [address] [place], [place]  [address] Pager [phone] Phone: [phone]) [phone]; Fax: [phone]

## 2017-09-22 NOTE — Patient Instructions (Signed)
Description   Take 2.5 tablets today, then start taking 1.5 tablets everyday except 2 tablets on Mondays, Wednesdays and Fridays.   Recheck in 2 weeks since start of new Optiva diet. Call us   # [phone] Coumadin Clinic with any medication changes or concerns.

## 2017-09-22 NOTE — Patient Instructions (Signed)
Medication Instructions:  Your physician recommends that you continue on your current medications as directed. Please refer to the Current Medication list given to you today.   Labwork: None Ordered   Testing/Procedures: None Ordered   Follow-Up: Your physician wants you to follow-up in: 1 year with Dr. Nahser.  You will receive a reminder letter in the mail two months in advance. If you don't receive a letter, please call our office to schedule the follow-up appointment.   If you need a refill on your cardiac medications before your next appointment, please call your pharmacy.   Thank you for choosing [facility]! Zenola Dezarn, RN [phone]    

## 2017-10-06 ENCOUNTER — Ambulatory Visit (INDEPENDENT_AMBULATORY_CARE_PROVIDER_SITE_OTHER): Payer: Medicare Other

## 2017-10-06 DIAGNOSIS — Z952 Presence of prosthetic heart valve: Secondary | ICD-10-CM | POA: Diagnosis not present

## 2017-10-06 DIAGNOSIS — Z5181 Encounter for therapeutic drug level monitoring: Secondary | ICD-10-CM | POA: Diagnosis not present

## 2017-10-06 LAB — POCT INR: INR: 1.6 — AB (ref 2.0–3.0)

## 2017-10-06 NOTE — Patient Instructions (Signed)
Description   Take 2.5 tablets today, then start taking 2 tablets daily except 1.5 tablets on Tuesdays, Thursdays and Saturdays.   Recheck in 2 weeks since start of new Optiva diet. Call us   # [phone] Coumadin Clinic with any medication changes or concerns.

## 2017-10-20 ENCOUNTER — Ambulatory Visit (INDEPENDENT_AMBULATORY_CARE_PROVIDER_SITE_OTHER): Payer: Medicare Other

## 2017-10-20 DIAGNOSIS — Z952 Presence of prosthetic heart valve: Secondary | ICD-10-CM

## 2017-10-20 DIAGNOSIS — Z5181 Encounter for therapeutic drug level monitoring: Secondary | ICD-10-CM | POA: Diagnosis not present

## 2017-10-20 LAB — POCT INR: INR: 1.4 — AB (ref 2.0–3.0)

## 2017-10-20 NOTE — Patient Instructions (Signed)
Description   Take 2.5 tablets today, then start taking 2 tablets daily except 1.5 tablets on Tuesdays and Saturdays.   Recheck in 10 days since start of new Optiva diet. Call us  # [phone] Coumadin Clinic with any medication changes or concerns.

## 2017-10-31 ENCOUNTER — Ambulatory Visit (INDEPENDENT_AMBULATORY_CARE_PROVIDER_SITE_OTHER): Payer: Medicare Other | Admitting: *Deleted

## 2017-10-31 DIAGNOSIS — Z5181 Encounter for therapeutic drug level monitoring: Secondary | ICD-10-CM

## 2017-10-31 DIAGNOSIS — Z952 Presence of prosthetic heart valve: Secondary | ICD-10-CM

## 2017-10-31 LAB — POCT INR: INR: 2.6 (ref 2.0–3.0)

## 2017-10-31 NOTE — Patient Instructions (Addendum)
Description   Continue taking 2 tablets daily except 1.5 tablets on Tuesdays and Saturdays. Recheck in 3 weeks (stopped Optiva diet September 26th).  Call us  # [phone] Coumadin Clinic with any medication changes or concerns.

## 2017-12-02 ENCOUNTER — Ambulatory Visit (INDEPENDENT_AMBULATORY_CARE_PROVIDER_SITE_OTHER): Payer: Medicare Other | Admitting: *Deleted

## 2017-12-02 DIAGNOSIS — Z952 Presence of prosthetic heart valve: Secondary | ICD-10-CM

## 2017-12-02 DIAGNOSIS — Z5181 Encounter for therapeutic drug level monitoring: Secondary | ICD-10-CM | POA: Diagnosis not present

## 2017-12-02 LAB — POCT INR: INR: 2.4 (ref 2.0–3.0)

## 2017-12-02 NOTE — Patient Instructions (Addendum)
Description   Today take 2.5 tablets then continue taking 2 tablets daily except 1.5 tablets on Tuesdays and Saturdays. Recheck in 4 weeks (stopped Optiva diet September 26th).  Call us  # [phone] Coumadin Clinic with any medication changes or concerns.

## 2017-12-20 ENCOUNTER — Other Ambulatory Visit: Payer: Self-pay | Admitting: Cardiovascular Disease

## 2017-12-20 DIAGNOSIS — I3489 Other nonrheumatic mitral valve disorders: Secondary | ICD-10-CM

## 2017-12-20 DIAGNOSIS — I10 Essential (primary) hypertension: Secondary | ICD-10-CM

## 2017-12-20 DIAGNOSIS — I348 Other nonrheumatic mitral valve disorders: Secondary | ICD-10-CM

## 2017-12-30 ENCOUNTER — Ambulatory Visit (INDEPENDENT_AMBULATORY_CARE_PROVIDER_SITE_OTHER): Payer: Medicare Other | Admitting: Pharmacist

## 2017-12-30 DIAGNOSIS — Z952 Presence of prosthetic heart valve: Secondary | ICD-10-CM | POA: Diagnosis not present

## 2017-12-30 DIAGNOSIS — Z5181 Encounter for therapeutic drug level monitoring: Secondary | ICD-10-CM | POA: Diagnosis not present

## 2017-12-30 LAB — POCT INR: INR: 3.8 — AB (ref 2.0–3.0)

## 2017-12-30 NOTE — Patient Instructions (Signed)
Description   Take only 1 tablet today then continue taking 2 tablets daily except 1.5 tablets on Tuesdays and Saturdays. Recheck in 3 weeks.  Call us  # [phone] Coumadin Clinic with any medication changes or concerns.

## 2018-01-30 ENCOUNTER — Ambulatory Visit: Payer: Medicare Other | Admitting: *Deleted

## 2018-01-30 DIAGNOSIS — Z952 Presence of prosthetic heart valve: Secondary | ICD-10-CM

## 2018-01-30 DIAGNOSIS — Z5181 Encounter for therapeutic drug level monitoring: Secondary | ICD-10-CM | POA: Diagnosis not present

## 2018-01-30 LAB — POCT INR: INR: 3.8 — AB (ref 2.0–3.0)

## 2018-01-30 NOTE — Patient Instructions (Addendum)
Description   Hold today's dose then start taking 2 tablets daily except 1.5 tablets on Tuesdays, Thursdays, and Saturdays. Recheck in 2 weeks. Call us # [phone] Coumadin Clinic with any medication changes or concerns.

## 2018-02-17 ENCOUNTER — Ambulatory Visit (INDEPENDENT_AMBULATORY_CARE_PROVIDER_SITE_OTHER): Payer: Medicare Other

## 2018-02-17 DIAGNOSIS — Z952 Presence of prosthetic heart valve: Secondary | ICD-10-CM | POA: Diagnosis not present

## 2018-02-17 DIAGNOSIS — Z5181 Encounter for therapeutic drug level monitoring: Secondary | ICD-10-CM

## 2018-02-17 LAB — POCT INR: INR: 3 (ref 2.0–3.0)

## 2018-02-17 NOTE — Patient Instructions (Signed)
Description   Continue on same dosage 2 tablets daily except 1.5 tablets on Tuesdays, Thursdays, and Saturdays. Recheck in 4 weeks. Call us # [phone] Coumadin Clinic with any medication changes or concerns.

## 2018-03-31 ENCOUNTER — Encounter (INDEPENDENT_AMBULATORY_CARE_PROVIDER_SITE_OTHER): Payer: Self-pay

## 2018-03-31 ENCOUNTER — Ambulatory Visit (INDEPENDENT_AMBULATORY_CARE_PROVIDER_SITE_OTHER): Payer: Medicare Other | Admitting: *Deleted

## 2018-03-31 DIAGNOSIS — Z952 Presence of prosthetic heart valve: Secondary | ICD-10-CM

## 2018-03-31 DIAGNOSIS — Z5181 Encounter for therapeutic drug level monitoring: Secondary | ICD-10-CM | POA: Diagnosis not present

## 2018-03-31 LAB — POCT INR: INR: 2.5 (ref 2.0–3.0)

## 2018-03-31 NOTE — Patient Instructions (Signed)
Description   Continue on same dosage 2 tablets daily except 1.5 tablets on Tuesdays, Thursdays, and Saturdays. Recheck in 5 weeks. Call us # [phone] Coumadin Clinic with any medication changes or concerns.

## 2018-05-04 ENCOUNTER — Telehealth: Payer: Self-pay

## 2018-05-04 NOTE — Telephone Encounter (Signed)

## 2018-05-05 ENCOUNTER — Other Ambulatory Visit: Payer: Self-pay

## 2018-05-05 ENCOUNTER — Ambulatory Visit (INDEPENDENT_AMBULATORY_CARE_PROVIDER_SITE_OTHER): Payer: Medicare Other | Admitting: *Deleted

## 2018-05-05 DIAGNOSIS — Z5181 Encounter for therapeutic drug level monitoring: Secondary | ICD-10-CM

## 2018-05-05 DIAGNOSIS — Z952 Presence of prosthetic heart valve: Secondary | ICD-10-CM | POA: Diagnosis not present

## 2018-05-05 LAB — POCT INR: INR: 4.1 — AB (ref 2.0–3.0)

## 2018-05-19 DIAGNOSIS — I10 Essential (primary) hypertension: Secondary | ICD-10-CM | POA: Diagnosis not present

## 2018-05-19 DIAGNOSIS — Z1389 Encounter for screening for other disorder: Secondary | ICD-10-CM | POA: Diagnosis not present

## 2018-05-19 DIAGNOSIS — Z0001 Encounter for general adult medical examination with abnormal findings: Secondary | ICD-10-CM | POA: Diagnosis not present

## 2018-05-19 DIAGNOSIS — I6350 Cerebral infarction due to unspecified occlusion or stenosis of unspecified cerebral artery: Secondary | ICD-10-CM | POA: Diagnosis not present

## 2018-05-25 ENCOUNTER — Telehealth: Payer: Self-pay

## 2018-05-25 NOTE — Telephone Encounter (Signed)

## 2018-05-26 ENCOUNTER — Ambulatory Visit (INDEPENDENT_AMBULATORY_CARE_PROVIDER_SITE_OTHER): Payer: Medicare Other | Admitting: *Deleted

## 2018-05-26 ENCOUNTER — Other Ambulatory Visit: Payer: Self-pay

## 2018-05-26 DIAGNOSIS — Z952 Presence of prosthetic heart valve: Secondary | ICD-10-CM

## 2018-05-26 DIAGNOSIS — Z5181 Encounter for therapeutic drug level monitoring: Secondary | ICD-10-CM

## 2018-05-26 LAB — POCT INR: INR: 2.8 (ref 2.0–3.0)

## 2018-06-22 ENCOUNTER — Telehealth: Payer: Self-pay

## 2018-06-22 NOTE — Telephone Encounter (Signed)
1. COVID-19 Pre-Screening Questions:  . In the past 7 to 10 days have you had a cough,  shortness of breath, headache, congestion, fever (100 or greater) body aches, chills, sore throat, or sudden loss of taste or sense of smell?no . Have you been around anyone with known Covid 19.no . Have you been around anyone who is awaiting Covid 19 test results in the past 7 to 10 days?no . Have you been around anyone who has been exposed to Covid 19, or has mentioned symptoms of Covid 19 within the past 7 to 10 days?no  2. Pt advised of visitor restrictions (no visitors allowed except if needed to conduct the visit). Also advised to arrive at appointment time and wear a mask.  Yes     

## 2018-06-23 ENCOUNTER — Other Ambulatory Visit: Payer: Self-pay

## 2018-06-23 ENCOUNTER — Ambulatory Visit (INDEPENDENT_AMBULATORY_CARE_PROVIDER_SITE_OTHER): Payer: Medicare Other

## 2018-06-23 DIAGNOSIS — Z952 Presence of prosthetic heart valve: Secondary | ICD-10-CM | POA: Diagnosis not present

## 2018-06-23 DIAGNOSIS — Z5181 Encounter for therapeutic drug level monitoring: Secondary | ICD-10-CM

## 2018-06-23 LAB — POCT INR: INR: 4.9 — AB (ref 2.0–3.0)

## 2018-06-23 NOTE — Patient Instructions (Signed)
Description   Called spoke with pt, advised to skip today's dosage of Coumadin, then start taking 1.5 tablets daily except 2 tablets on Mondays, Wednesdays and Fridays. Recheck in 2 weeks. Call us # [phone] Coumadin Clinic with any medication changes or concerns.

## 2018-06-29 ENCOUNTER — Telehealth: Payer: Self-pay

## 2018-06-29 NOTE — Telephone Encounter (Signed)

## 2018-07-07 ENCOUNTER — Other Ambulatory Visit: Payer: Self-pay

## 2018-07-07 ENCOUNTER — Ambulatory Visit (INDEPENDENT_AMBULATORY_CARE_PROVIDER_SITE_OTHER): Payer: Medicare Other

## 2018-07-07 DIAGNOSIS — Z5181 Encounter for therapeutic drug level monitoring: Secondary | ICD-10-CM | POA: Diagnosis not present

## 2018-07-07 DIAGNOSIS — Z952 Presence of prosthetic heart valve: Secondary | ICD-10-CM | POA: Diagnosis not present

## 2018-07-07 LAB — POCT INR: INR: 4.3 — AB (ref 2.0–3.0)

## 2018-07-07 NOTE — Patient Instructions (Signed)
Description   Skip today's dosage of Coumadin, then start taking 1.5 tablets daily except 2 tablets on Mondays and Fridays. Recheck in 2 weeks. Call us # [phone] Coumadin Clinic with any medication changes or concerns.

## 2018-07-17 ENCOUNTER — Telehealth: Payer: Self-pay

## 2018-07-17 NOTE — Telephone Encounter (Signed)

## 2018-07-21 ENCOUNTER — Ambulatory Visit (INDEPENDENT_AMBULATORY_CARE_PROVIDER_SITE_OTHER): Payer: Medicare Other | Admitting: Pharmacist

## 2018-07-21 ENCOUNTER — Other Ambulatory Visit: Payer: Self-pay

## 2018-07-21 DIAGNOSIS — Z952 Presence of prosthetic heart valve: Secondary | ICD-10-CM

## 2018-07-21 DIAGNOSIS — Z5181 Encounter for therapeutic drug level monitoring: Secondary | ICD-10-CM | POA: Diagnosis not present

## 2018-07-21 LAB — POCT INR: INR: 3.4 — AB (ref 2.0–3.0)

## 2018-07-21 NOTE — Patient Instructions (Addendum)
  Description   Continue taking 1.5 tablets daily except 2 tablets on Mondays and Fridays. Recheck in 3 weeks. Call us # [phone] Coumadin Clinic with any medication changes or concerns.

## 2018-08-08 ENCOUNTER — Telehealth: Payer: Self-pay

## 2018-08-08 NOTE — Telephone Encounter (Signed)

## 2018-08-11 ENCOUNTER — Ambulatory Visit (INDEPENDENT_AMBULATORY_CARE_PROVIDER_SITE_OTHER): Payer: Medicare Other

## 2018-08-11 ENCOUNTER — Other Ambulatory Visit: Payer: Self-pay

## 2018-08-11 DIAGNOSIS — Z952 Presence of prosthetic heart valve: Secondary | ICD-10-CM | POA: Diagnosis not present

## 2018-08-11 DIAGNOSIS — Z5181 Encounter for therapeutic drug level monitoring: Secondary | ICD-10-CM | POA: Diagnosis not present

## 2018-08-11 LAB — POCT INR: INR: 4.1 — AB (ref 2.0–3.0)

## 2018-08-11 NOTE — Patient Instructions (Signed)
Description   Skip today's dosage of Coumadin, then resume same dosage 1.5 tablets daily except 2 tablets on Mondays and Fridays. Recheck in 3 weeks. Call us # [phone] Coumadin Clinic with any medication changes or concerns.

## 2018-09-01 ENCOUNTER — Ambulatory Visit (INDEPENDENT_AMBULATORY_CARE_PROVIDER_SITE_OTHER): Payer: Medicare Other | Admitting: Pharmacist

## 2018-09-01 ENCOUNTER — Other Ambulatory Visit: Payer: Self-pay

## 2018-09-01 DIAGNOSIS — Z952 Presence of prosthetic heart valve: Secondary | ICD-10-CM

## 2018-09-01 DIAGNOSIS — Z5181 Encounter for therapeutic drug level monitoring: Secondary | ICD-10-CM

## 2018-09-01 LAB — POCT INR: INR: 3.2 — AB (ref 2.0–3.0)

## 2018-09-01 NOTE — Patient Instructions (Signed)
Description   Continue taking 1.5 tablets daily except 2 tablets on Mondays and Fridays. Recheck in 4 weeks. Call us # [phone] Coumadin Clinic with any medication changes or concerns.     

## 2018-09-18 ENCOUNTER — Other Ambulatory Visit: Payer: Self-pay | Admitting: Cardiovascular Disease

## 2018-09-18 DIAGNOSIS — I10 Essential (primary) hypertension: Secondary | ICD-10-CM

## 2018-09-18 DIAGNOSIS — I348 Other nonrheumatic mitral valve disorders: Secondary | ICD-10-CM

## 2018-09-18 DIAGNOSIS — I3489 Other nonrheumatic mitral valve disorders: Secondary | ICD-10-CM

## 2018-09-20 ENCOUNTER — Other Ambulatory Visit: Payer: Self-pay | Admitting: Cardiovascular Disease

## 2018-09-20 DIAGNOSIS — I348 Other nonrheumatic mitral valve disorders: Secondary | ICD-10-CM

## 2018-09-20 DIAGNOSIS — I10 Essential (primary) hypertension: Secondary | ICD-10-CM

## 2018-09-20 DIAGNOSIS — I3489 Other nonrheumatic mitral valve disorders: Secondary | ICD-10-CM

## 2018-09-29 ENCOUNTER — Other Ambulatory Visit: Payer: Self-pay

## 2018-09-29 ENCOUNTER — Ambulatory Visit (INDEPENDENT_AMBULATORY_CARE_PROVIDER_SITE_OTHER): Payer: Medicare Other | Admitting: *Deleted

## 2018-09-29 DIAGNOSIS — Z5181 Encounter for therapeutic drug level monitoring: Secondary | ICD-10-CM

## 2018-09-29 DIAGNOSIS — Z952 Presence of prosthetic heart valve: Secondary | ICD-10-CM

## 2018-09-29 LAB — POCT INR: INR: 2.5 (ref 2.0–3.0)

## 2018-09-29 NOTE — Patient Instructions (Signed)
Description   Continue taking 1.5 tablets daily except 2 tablets on Mondays and Fridays. Recheck in 4 weeks. Call us # [phone] Coumadin Clinic with any medication changes or concerns.

## 2018-10-22 ENCOUNTER — Other Ambulatory Visit: Payer: Self-pay | Admitting: Cardiovascular Disease

## 2018-10-22 DIAGNOSIS — I3489 Other nonrheumatic mitral valve disorders: Secondary | ICD-10-CM

## 2018-10-22 DIAGNOSIS — I348 Other nonrheumatic mitral valve disorders: Secondary | ICD-10-CM

## 2018-10-22 DIAGNOSIS — I10 Essential (primary) hypertension: Secondary | ICD-10-CM

## 2018-10-26 ENCOUNTER — Other Ambulatory Visit: Payer: Self-pay | Admitting: Cardiovascular Disease

## 2018-10-26 DIAGNOSIS — I348 Other nonrheumatic mitral valve disorders: Secondary | ICD-10-CM

## 2018-10-26 DIAGNOSIS — I3489 Other nonrheumatic mitral valve disorders: Secondary | ICD-10-CM

## 2018-10-26 DIAGNOSIS — I10 Essential (primary) hypertension: Secondary | ICD-10-CM

## 2018-10-27 ENCOUNTER — Encounter: Payer: Self-pay | Admitting: Nurse Practitioner

## 2018-10-27 ENCOUNTER — Ambulatory Visit (INDEPENDENT_AMBULATORY_CARE_PROVIDER_SITE_OTHER): Payer: Medicare Other | Admitting: Cardiovascular Disease

## 2018-10-27 ENCOUNTER — Ambulatory Visit (INDEPENDENT_AMBULATORY_CARE_PROVIDER_SITE_OTHER): Payer: Medicare Other | Admitting: *Deleted

## 2018-10-27 ENCOUNTER — Encounter: Payer: Self-pay | Admitting: Cardiovascular Disease

## 2018-10-27 ENCOUNTER — Other Ambulatory Visit: Payer: Self-pay

## 2018-10-27 VITALS — BP 112/72 | HR 88 | Ht 59.0 in | Wt 190.8 lb

## 2018-10-27 DIAGNOSIS — Z952 Presence of prosthetic heart valve: Secondary | ICD-10-CM

## 2018-10-27 DIAGNOSIS — Z5181 Encounter for therapeutic drug level monitoring: Secondary | ICD-10-CM | POA: Diagnosis not present

## 2018-10-27 DIAGNOSIS — I059 Rheumatic mitral valve disease, unspecified: Secondary | ICD-10-CM | POA: Diagnosis not present

## 2018-10-27 DIAGNOSIS — R9431 Abnormal electrocardiogram [ECG] [EKG]: Secondary | ICD-10-CM

## 2018-10-27 DIAGNOSIS — I10 Essential (primary) hypertension: Secondary | ICD-10-CM | POA: Diagnosis not present

## 2018-10-27 LAB — POCT INR: INR: 2.2 (ref 2.0–3.0)

## 2018-10-27 NOTE — Progress Notes (Signed)
Natasha Davis Date of Birth  06-26-1955   [address]    [place], [place]  [address]       Natasha Davis is a 63 year old female.  1.  Mitral stenosis 2.  Status post mitral valve  placement 3 . Chronic anticoagulation 4.  Hypertension 5.  History of CVA ( prior to being diagnoses with mitral stenosis) 6.  History of claudication 7.  Cigarette smoking  Natasha Davis has done well since I last saw her in December, 2011.  She's not having episodes of chest pain or shortness breath. She's been on Coumadin and her protime levels have been therapeutic.  She quit smoking but started back again.  May 04, 2012  Natasha Davis is doing well from a cardiac standpoint.   She is still smoking.   Jun 20, 2015:  Natasha Davis is seen for a follow up visit  - missed last years office visit.  Has been keeping her INR appts.  No cardiac issues.  BP and HR are good,  Still smoking   March 26, 2016: Natasha Davis is seen today for a follow up visit Has started back smoking   Aug. 29, 2019  Doing ok INR was 1.9 today - have been eating more salad  No CP or dyspnea Has mechanical MVR in 1998.   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  Still smokes   October 27 2018:   Natasha Davis is seen today for a follow-up visit.  She has a history of mitral valve replacement for mitral stenosis.  ECG now shows previous Ant. MI   ( not there on last years ECG  She denies any cp  Still smoking   Current Outpatient Medications on File Prior to Visit  Medication Sig Dispense Refill  . acetaminophen (TYLENOL ARTHRITIS PAIN) 650 MG CR tablet Take 650 mg by mouth every 8 (eight) hours as needed. Pain     . atorvastatin (LIPITOR) 20 MG tablet TAKE 1 TABLET BY MOUTH EVERY DAY 60 tablet 0  . levETIRAcetam (KEPPRA) 500 MG tablet Take 500 mg by mouth daily.    . metoprolol tartrate (LOPRESSOR) 50 MG tablet TAKE 1 TABLET BY MOUTH EVERY DAY 90 tablet 3  . triamterene-hydrochlorothiazide (MAXZIDE-25) 37.5-25 MG  tablet TAKE 1 TABLET BY MOUTH EVERY DAY 90 tablet 0  . warfarin (COUMADIN) 5 MG tablet TAKE 1 & 1/2 TABLETS DAILY - EXCEPT ON MON, WED, AND FRI TAKE 2 TABS - OR AS DIRECTED BY PHYSICIAN 160 tablet 0   No current facility-administered medications on file prior to visit.     Allergies  Allergen Reactions  . Penicillins     Past Medical History:  Diagnosis Date  . Chronic anticoagulation   . Claudication ([place])   . History of CVA (cerebrovascular accident)   . Hypertension   . Mitral stenosis   . Seizure ([facility])    HX    Past Surgical History:  Procedure Laterality Date  . CARDIAC CATHETERIZATION  06/01/96   NORMAL LEFT VENTRICULAR SYSTOLIC FUNCTION. EF 60%  . CESAREAN SECTION     X2  . MITRAL VALVE REPLACEMENT      Social History   Tobacco Use  Smoking Status Current Every Day Smoker  . Packs/day: 1.00  . Types: Cigarettes  . Last attempt to quit: 01/25/2006  . Years since quitting: 12.7  Smokeless Tobacco Never Used    Social History   Substance and Sexual Activity  Alcohol  Use No    Family History  Problem Relation Age of Onset  . Hypertension Mother   . Heart disease Father   . Hypertension Father   . Heart attack Father   . Stroke Brother     Reviw of Systems:  Reviewed in the HPI.  All other systems are negative.  Physical Exam: Blood pressure 112/72, pulse 88, height 4\' 11"  (1.499 m), weight 190 lb 12.8 oz (86.5 kg), SpO2 94 %.  GEN:  Well nourished, well developed in no acute distress HEENT: Normal NECK: No JVD; No carotid bruits LYMPHATICS: No lymphadenopathy CARDIAC: RRR,  Mechanical S1, normal S2  RESPIRATORY:  Clear to auscultation without rales, wheezing or rhonchi  ABDOMEN: Soft, non-tender, non-distended MUSCULOSKELETAL:  No edema; No deformity  SKIN: Warm and dry NEUROLOGIC:  Alert and oriented x 3   ECG: October 27, 2018: Normal sinus rhythm at 88 beats minute.  Occasional PACs.  Assessment / Plan:   1.  Mitral stenosis -she is  status post mitral valve replacement.  She is on Coumadin.  Her last echocardiogram was 2005.  We will repeat her echocardiogram.  2.  Abnormal EKG.  Her EKG this morning is suggestive of a previous inferior wall myocardial infarction as well as a new anterior wall myocardial infarction.  She did not have these anterior changes on her previous EKG.  We will schedule her for a stress Myoview study for further evaluation.  3 . Chronic anticoagulation -she will check her INR level today.  4.  Hypertension -blood pressures well controlled.  5.  History of CVA ( prior to being diagnoses with mitral stenosis) -no further strokes.  Continue Coumadin.  6.  History of claudication 7.  Cigarette smoking -  Ive advised cessation again .    2006, MD  10/27/2018 11:17 AM    [facility] [address] [place],  [address] [place], [place]  [place] Pager [phone] Phone: [phone]; Fax: [phone]

## 2018-10-27 NOTE — Patient Instructions (Signed)
Description   Take an extra 1/2 tablet tomorrow, the continue taking 1.5 tablets daily except 2 tablets on Mondays and Fridays. Recheck in 3 weeks. Call us # [phone] [facility] with any medication changes or concerns.

## 2018-10-27 NOTE — Patient Instructions (Addendum)
Medication Instructions:  Your physician recommends that you continue on your current medications as directed. Please refer to the Current Medication list given to you today.  If you need a refill on your cardiac medications before your next appointment, please call your pharmacy.    Lab work: None Ordered   Testing/Procedures: Your physician has requested that you have a lexiscan myoview. For further information please visit [email or web address]. Please follow instruction sheet, as given.  Your physician has requested that you have an echocardiogram. Echocardiography is a painless test that uses sound waves to create images of your heart. It provides your doctor with information about the size and shape of your heart and how well your heart's chambers and valves are working. This procedure takes approximately one hour. There are no restrictions for this procedure.    Follow-Up: At [facility], you and your health needs are our priority.  As part of our continuing mission to provide you with exceptional heart care, we have created designated Provider Care Teams.  These Care Teams include your primary Cardiologist (physician) and Advanced Practice Providers (APPs -  Physician Assistants and Nurse Practitioners) who all work together to provide you with the care you need, when you need it. You will need a follow up appointment in:  1 years.  Please call our office 2 months in advance to schedule this appointment.  You may see Dr. Acie Fredrickson or one of the following Advanced Practice Providers on your designated Care Team: Richardson Dopp, PA-C [place], [place] . Daune Perch, NP

## 2018-11-02 ENCOUNTER — Telehealth (HOSPITAL_COMMUNITY): Payer: Self-pay

## 2018-11-02 NOTE — Telephone Encounter (Signed)
Spoke with the patient and gave her her intructions for her stress test. We spoke about her possibly being a 2 day study due to BMI. Asked to call back with any questions. SWilliams EMTP

## 2018-11-07 ENCOUNTER — Encounter (HOSPITAL_COMMUNITY): Payer: Medicare Other

## 2018-11-07 ENCOUNTER — Ambulatory Visit (HOSPITAL_COMMUNITY): Payer: Medicare Other | Attending: Internal Medicine

## 2018-11-07 ENCOUNTER — Other Ambulatory Visit: Payer: Self-pay

## 2018-11-07 ENCOUNTER — Ambulatory Visit (HOSPITAL_BASED_OUTPATIENT_CLINIC_OR_DEPARTMENT_OTHER): Payer: Medicare Other

## 2018-11-07 DIAGNOSIS — Z952 Presence of prosthetic heart valve: Secondary | ICD-10-CM | POA: Diagnosis not present

## 2018-11-07 DIAGNOSIS — I059 Rheumatic mitral valve disease, unspecified: Secondary | ICD-10-CM

## 2018-11-07 DIAGNOSIS — R9431 Abnormal electrocardiogram [ECG] [EKG]: Secondary | ICD-10-CM

## 2018-11-07 LAB — MYOCARDIAL PERFUSION IMAGING
LV dias vol: 117 mL (ref 46–106)
LV sys vol: 66 mL
Peak HR: 108 {beats}/min
Rest HR: 74 {beats}/min
SDS: 3
SRS: 0
SSS: 3
TID: 1.15

## 2018-11-07 LAB — ECHOCARDIOGRAM COMPLETE
Height: 59 in
Weight: 3040 oz

## 2018-11-07 MED ORDER — ADENOSINE (DIAGNOSTIC) 3 MG/ML IV SOLN
0.5600 mg/kg | Freq: Once | INTRAVENOUS | Status: AC
  Administered 2018-11-07: 48.3 mg via INTRAVENOUS

## 2018-11-07 MED ORDER — TECHNETIUM TC 99M TETROFOSMIN IV KIT
10.5000 | PACK | Freq: Once | INTRAVENOUS | Status: AC | PRN
  Administered 2018-11-07: 10.5 via INTRAVENOUS
  Filled 2018-11-07: qty 11

## 2018-11-07 MED ORDER — TECHNETIUM TC 99M TETROFOSMIN IV KIT
32.6000 | PACK | Freq: Once | INTRAVENOUS | Status: AC | PRN
  Administered 2018-11-07: 32.6 via INTRAVENOUS
  Filled 2018-11-07: qty 33

## 2018-11-09 ENCOUNTER — Other Ambulatory Visit (HOSPITAL_COMMUNITY): Payer: Medicare Other

## 2018-11-15 ENCOUNTER — Other Ambulatory Visit: Payer: Self-pay | Admitting: Cardiovascular Disease

## 2018-11-15 DIAGNOSIS — I10 Essential (primary) hypertension: Secondary | ICD-10-CM

## 2018-11-15 DIAGNOSIS — I348 Other nonrheumatic mitral valve disorders: Secondary | ICD-10-CM

## 2018-11-15 DIAGNOSIS — I3489 Other nonrheumatic mitral valve disorders: Secondary | ICD-10-CM

## 2018-11-15 MED ORDER — ATORVASTATIN CALCIUM 20 MG PO TABS: 20.0000 mg | ORAL_TABLET | Freq: Every day | ORAL | 3 refills | Status: DC

## 2018-11-15 MED ORDER — TRIAMTERENE-HCTZ 37.5-25 MG PO TABS: 1.0000 | ORAL_TABLET | Freq: Every day | ORAL | 3 refills | Status: DC

## 2018-11-15 MED ORDER — METOPROLOL TARTRATE 50 MG PO TABS: 50.0000 mg | ORAL_TABLET | Freq: Every day | ORAL | 3 refills | Status: DC

## 2018-11-17 ENCOUNTER — Ambulatory Visit (INDEPENDENT_AMBULATORY_CARE_PROVIDER_SITE_OTHER): Payer: Medicare Other | Admitting: *Deleted

## 2018-11-17 ENCOUNTER — Other Ambulatory Visit: Payer: Self-pay

## 2018-11-17 DIAGNOSIS — Z952 Presence of prosthetic heart valve: Secondary | ICD-10-CM | POA: Diagnosis not present

## 2018-11-17 DIAGNOSIS — Z5181 Encounter for therapeutic drug level monitoring: Secondary | ICD-10-CM

## 2018-11-17 LAB — POCT INR: INR: 1.7 — AB (ref 2.0–3.0)

## 2018-11-17 NOTE — Patient Instructions (Signed)
Description   Take an extra 1/2 tablet today. Then start taking 2 tablet daily, except for 1.5 tablet on Sunday, Tuesday, and Thursday. Recheck in 2 weeks. Call us # [phone] [facility] with any medication changes or concerns.

## 2018-11-25 DIAGNOSIS — E119 Type 2 diabetes mellitus without complications: Secondary | ICD-10-CM | POA: Diagnosis not present

## 2018-11-25 DIAGNOSIS — I10 Essential (primary) hypertension: Secondary | ICD-10-CM | POA: Diagnosis not present

## 2018-11-25 DIAGNOSIS — E782 Mixed hyperlipidemia: Secondary | ICD-10-CM | POA: Diagnosis not present

## 2018-12-01 ENCOUNTER — Other Ambulatory Visit: Payer: Self-pay

## 2018-12-01 ENCOUNTER — Ambulatory Visit (INDEPENDENT_AMBULATORY_CARE_PROVIDER_SITE_OTHER): Payer: Medicare Other

## 2018-12-01 DIAGNOSIS — Z952 Presence of prosthetic heart valve: Secondary | ICD-10-CM | POA: Diagnosis not present

## 2018-12-01 DIAGNOSIS — Z5181 Encounter for therapeutic drug level monitoring: Secondary | ICD-10-CM

## 2018-12-01 LAB — POCT INR: INR: 4.1 — AB (ref 2.0–3.0)

## 2018-12-01 NOTE — Patient Instructions (Signed)
Description   Skip today's dosage of Coumadin, then start taking 1.5 tablets daily except 2 tablets on Mondays, Wednesdays and Fridays. Recheck in 2 weeks. Call us # [phone] [facility] with any medication changes or concerns.

## 2018-12-15 ENCOUNTER — Other Ambulatory Visit: Payer: Self-pay

## 2018-12-15 ENCOUNTER — Ambulatory Visit (INDEPENDENT_AMBULATORY_CARE_PROVIDER_SITE_OTHER): Payer: Medicare Other | Admitting: *Deleted

## 2018-12-15 DIAGNOSIS — Z5181 Encounter for therapeutic drug level monitoring: Secondary | ICD-10-CM

## 2018-12-15 DIAGNOSIS — Z952 Presence of prosthetic heart valve: Secondary | ICD-10-CM | POA: Diagnosis not present

## 2018-12-15 LAB — POCT INR: INR: 2.9 (ref 2.0–3.0)

## 2018-12-15 NOTE — Patient Instructions (Signed)
Description   Continue taking 1.5 tablets daily except 2 tablets on Mondays, Wednesdays and Fridays. Recheck in 3 weeks. Call us # [phone] [facility] with any medication changes or concerns.

## 2018-12-25 DIAGNOSIS — E119 Type 2 diabetes mellitus without complications: Secondary | ICD-10-CM | POA: Diagnosis not present

## 2018-12-25 DIAGNOSIS — E7849 Other hyperlipidemia: Secondary | ICD-10-CM | POA: Diagnosis not present

## 2018-12-25 DIAGNOSIS — I10 Essential (primary) hypertension: Secondary | ICD-10-CM | POA: Diagnosis not present

## 2019-01-01 ENCOUNTER — Other Ambulatory Visit: Payer: Self-pay | Admitting: Cardiovascular Disease

## 2019-01-01 DIAGNOSIS — I10 Essential (primary) hypertension: Secondary | ICD-10-CM

## 2019-01-01 DIAGNOSIS — I3489 Other nonrheumatic mitral valve disorders: Secondary | ICD-10-CM

## 2019-01-01 DIAGNOSIS — I348 Other nonrheumatic mitral valve disorders: Secondary | ICD-10-CM

## 2019-01-05 ENCOUNTER — Ambulatory Visit (INDEPENDENT_AMBULATORY_CARE_PROVIDER_SITE_OTHER): Payer: Medicare Other | Admitting: *Deleted

## 2019-01-05 ENCOUNTER — Other Ambulatory Visit: Payer: Self-pay

## 2019-01-05 DIAGNOSIS — Z5181 Encounter for therapeutic drug level monitoring: Secondary | ICD-10-CM

## 2019-01-05 DIAGNOSIS — Z952 Presence of prosthetic heart valve: Secondary | ICD-10-CM | POA: Diagnosis not present

## 2019-01-05 LAB — POCT INR: INR: 4.3 — AB (ref 2.0–3.0)

## 2019-01-05 NOTE — Patient Instructions (Signed)
Description   Hold today, then continue taking 1.5 tablets daily except 2 tablets on Mondays, Wednesdays and Fridays. Recheck in 2-3 weeks. Call us # [phone] [facility] with any medication changes or concerns.

## 2019-01-23 ENCOUNTER — Other Ambulatory Visit: Payer: Self-pay

## 2019-01-23 ENCOUNTER — Ambulatory Visit (INDEPENDENT_AMBULATORY_CARE_PROVIDER_SITE_OTHER): Payer: Medicare Other | Admitting: *Deleted

## 2019-01-23 DIAGNOSIS — Z5181 Encounter for therapeutic drug level monitoring: Secondary | ICD-10-CM | POA: Diagnosis not present

## 2019-01-23 DIAGNOSIS — Z952 Presence of prosthetic heart valve: Secondary | ICD-10-CM

## 2019-01-23 LAB — POCT INR: INR: 4 — AB (ref 2.0–3.0)

## 2019-01-23 NOTE — Patient Instructions (Addendum)
Description   Hold today, then start taking 1.5 tablets daily except 2 tablets on Wednesdays and Friday. Recheck in 3 weeks. Call us # [phone] [facility] with any medication changes or concerns.

## 2019-01-25 DIAGNOSIS — E119 Type 2 diabetes mellitus without complications: Secondary | ICD-10-CM | POA: Diagnosis not present

## 2019-01-25 DIAGNOSIS — I10 Essential (primary) hypertension: Secondary | ICD-10-CM | POA: Diagnosis not present

## 2019-01-25 DIAGNOSIS — E7849 Other hyperlipidemia: Secondary | ICD-10-CM | POA: Diagnosis not present

## 2019-02-09 ENCOUNTER — Other Ambulatory Visit: Payer: Self-pay

## 2019-02-09 ENCOUNTER — Ambulatory Visit (INDEPENDENT_AMBULATORY_CARE_PROVIDER_SITE_OTHER): Payer: Medicare Other

## 2019-02-09 DIAGNOSIS — Z5181 Encounter for therapeutic drug level monitoring: Secondary | ICD-10-CM

## 2019-02-09 DIAGNOSIS — Z952 Presence of prosthetic heart valve: Secondary | ICD-10-CM | POA: Diagnosis not present

## 2019-02-09 LAB — POCT INR: INR: 4.4 — AB (ref 2.0–3.0)

## 2019-02-09 NOTE — Patient Instructions (Signed)
Description   Skip today's dosage of Coumadin, then start taking 1.5 tablets daily except 2 tablets on Wednesdays. Recheck in 2 weeks. Call us # [phone] [facility] with any medication changes or concerns.

## 2019-02-23 ENCOUNTER — Ambulatory Visit (INDEPENDENT_AMBULATORY_CARE_PROVIDER_SITE_OTHER): Payer: Medicare Other | Admitting: *Deleted

## 2019-02-23 ENCOUNTER — Other Ambulatory Visit: Payer: Self-pay

## 2019-02-23 DIAGNOSIS — Z5181 Encounter for therapeutic drug level monitoring: Secondary | ICD-10-CM

## 2019-02-23 DIAGNOSIS — Z952 Presence of prosthetic heart valve: Secondary | ICD-10-CM

## 2019-02-23 LAB — POCT INR: INR: 2.7 (ref 2.0–3.0)

## 2019-02-23 NOTE — Patient Instructions (Signed)
Description   Continue taking 1.5 tablets daily except 2 tablets on Wednesdays. Recheck in 4 weeks. Call us # [phone] [facility] with any medication changes or concerns.

## 2019-02-25 DIAGNOSIS — E119 Type 2 diabetes mellitus without complications: Secondary | ICD-10-CM | POA: Diagnosis not present

## 2019-02-25 DIAGNOSIS — I10 Essential (primary) hypertension: Secondary | ICD-10-CM | POA: Diagnosis not present

## 2019-02-25 DIAGNOSIS — E7849 Other hyperlipidemia: Secondary | ICD-10-CM | POA: Diagnosis not present

## 2019-03-03 ENCOUNTER — Other Ambulatory Visit: Payer: Self-pay | Admitting: Cardiovascular Disease

## 2019-03-03 DIAGNOSIS — I348 Other nonrheumatic mitral valve disorders: Secondary | ICD-10-CM

## 2019-03-03 DIAGNOSIS — I3489 Other nonrheumatic mitral valve disorders: Secondary | ICD-10-CM

## 2019-03-03 DIAGNOSIS — I10 Essential (primary) hypertension: Secondary | ICD-10-CM

## 2019-03-07 ENCOUNTER — Other Ambulatory Visit: Payer: Self-pay

## 2019-03-07 ENCOUNTER — Ambulatory Visit: Payer: Medicare Other | Attending: Internal Medicine

## 2019-03-07 DIAGNOSIS — Z20822 Contact with and (suspected) exposure to covid-19: Secondary | ICD-10-CM

## 2019-03-08 LAB — NOVEL CORONAVIRUS, NAA: SARS-CoV-2, NAA: NOT DETECTED

## 2019-03-23 ENCOUNTER — Ambulatory Visit (INDEPENDENT_AMBULATORY_CARE_PROVIDER_SITE_OTHER): Payer: Medicare Other

## 2019-03-23 ENCOUNTER — Other Ambulatory Visit: Payer: Self-pay

## 2019-03-23 DIAGNOSIS — Z952 Presence of prosthetic heart valve: Secondary | ICD-10-CM | POA: Diagnosis not present

## 2019-03-23 DIAGNOSIS — Z5181 Encounter for therapeutic drug level monitoring: Secondary | ICD-10-CM

## 2019-03-23 LAB — POCT INR: INR: 2.4 (ref 2.0–3.0)

## 2019-03-23 NOTE — Patient Instructions (Signed)
Description   Take 2 tablets today, then resume same dosage 1.5 tablets daily except 2 tablets on Wednesdays. Recheck in 4 weeks. Call us # [phone] [facility] with any medication changes or concerns.

## 2019-03-25 ENCOUNTER — Other Ambulatory Visit: Payer: Self-pay | Admitting: Cardiovascular Disease

## 2019-03-25 DIAGNOSIS — I348 Other nonrheumatic mitral valve disorders: Secondary | ICD-10-CM

## 2019-03-25 DIAGNOSIS — I3489 Other nonrheumatic mitral valve disorders: Secondary | ICD-10-CM

## 2019-03-25 DIAGNOSIS — I10 Essential (primary) hypertension: Secondary | ICD-10-CM

## 2019-04-04 ENCOUNTER — Other Ambulatory Visit: Payer: Self-pay

## 2019-04-04 DIAGNOSIS — I348 Other nonrheumatic mitral valve disorders: Secondary | ICD-10-CM

## 2019-04-04 DIAGNOSIS — I10 Essential (primary) hypertension: Secondary | ICD-10-CM

## 2019-04-04 DIAGNOSIS — I3489 Other nonrheumatic mitral valve disorders: Secondary | ICD-10-CM

## 2019-04-04 MED ORDER — TRIAMTERENE-HCTZ 37.5-25 MG PO TABS: 1.0000 | ORAL_TABLET | Freq: Every day | ORAL | 2 refills | Status: DC

## 2019-04-05 DIAGNOSIS — E119 Type 2 diabetes mellitus without complications: Secondary | ICD-10-CM | POA: Diagnosis not present

## 2019-04-05 DIAGNOSIS — I10 Essential (primary) hypertension: Secondary | ICD-10-CM | POA: Diagnosis not present

## 2019-04-05 DIAGNOSIS — E7849 Other hyperlipidemia: Secondary | ICD-10-CM | POA: Diagnosis not present

## 2019-04-06 ENCOUNTER — Telehealth: Payer: Self-pay | Admitting: Cardiovascular Disease

## 2019-04-06 DIAGNOSIS — I3489 Other nonrheumatic mitral valve disorders: Secondary | ICD-10-CM

## 2019-04-06 DIAGNOSIS — I10 Essential (primary) hypertension: Secondary | ICD-10-CM

## 2019-04-06 DIAGNOSIS — I348 Other nonrheumatic mitral valve disorders: Secondary | ICD-10-CM

## 2019-04-06 NOTE — Telephone Encounter (Signed)
New Message   *STAT* If patient is at the pharmacy, call can be transferred to refill team.   1. Which medications need to be refilled? (please list name of each medication and dose if known) metoprolol tartrate (LOPRESSOR) 50 MG tablet   2. Which pharmacy/location (including street and city if local pharmacy) is medication to be sent to? [id number] [place], [place] - [address]  3. Do they need a 30 day or 90 day supply? 90 day

## 2019-04-09 MED ORDER — METOPROLOL TARTRATE 50 MG PO TABS: 50.0000 mg | ORAL_TABLET | Freq: Every day | ORAL | 2 refills | Status: DC

## 2019-04-09 NOTE — Telephone Encounter (Signed)
Pt's medication was sent to pt's pharmacy as requested. Confirmation received.  °

## 2019-04-20 ENCOUNTER — Ambulatory Visit (INDEPENDENT_AMBULATORY_CARE_PROVIDER_SITE_OTHER): Payer: Medicare Other | Admitting: *Deleted

## 2019-04-20 ENCOUNTER — Other Ambulatory Visit: Payer: Self-pay

## 2019-04-20 DIAGNOSIS — Z5181 Encounter for therapeutic drug level monitoring: Secondary | ICD-10-CM | POA: Diagnosis not present

## 2019-04-20 DIAGNOSIS — Z952 Presence of prosthetic heart valve: Secondary | ICD-10-CM | POA: Diagnosis not present

## 2019-04-20 LAB — POCT INR: INR: 2.2 (ref 2.0–3.0)

## 2019-04-20 NOTE — Patient Instructions (Addendum)
Description   Take 2 tablets today, then start taking 1.5 tablets daily except 2 tablets on Mondays and Wednesdays. Recheck in 4 weeks. Call us # [phone] [facility] with any medication changes or concerns.

## 2019-05-04 DIAGNOSIS — H524 Presbyopia: Secondary | ICD-10-CM | POA: Diagnosis not present

## 2019-05-15 DIAGNOSIS — Z0000 Encounter for general adult medical examination without abnormal findings: Secondary | ICD-10-CM | POA: Diagnosis not present

## 2019-05-18 ENCOUNTER — Other Ambulatory Visit: Payer: Self-pay

## 2019-05-18 ENCOUNTER — Ambulatory Visit (INDEPENDENT_AMBULATORY_CARE_PROVIDER_SITE_OTHER): Payer: Medicare Other | Admitting: *Deleted

## 2019-05-18 DIAGNOSIS — Z5181 Encounter for therapeutic drug level monitoring: Secondary | ICD-10-CM | POA: Diagnosis not present

## 2019-05-18 DIAGNOSIS — Z952 Presence of prosthetic heart valve: Secondary | ICD-10-CM | POA: Diagnosis not present

## 2019-05-18 LAB — POCT INR: INR: 3 (ref 2.0–3.0)

## 2019-05-18 NOTE — Patient Instructions (Signed)
Description   Continue taking 1.5 tablets daily except 2 tablets on Mondays and Wednesdays. Recheck in 5 weeks. Call us # [phone] [facility] with any medication changes or concerns.

## 2019-06-22 ENCOUNTER — Ambulatory Visit (INDEPENDENT_AMBULATORY_CARE_PROVIDER_SITE_OTHER): Payer: Medicare Other

## 2019-06-22 ENCOUNTER — Other Ambulatory Visit: Payer: Self-pay

## 2019-06-22 DIAGNOSIS — Z5181 Encounter for therapeutic drug level monitoring: Secondary | ICD-10-CM | POA: Diagnosis not present

## 2019-06-22 DIAGNOSIS — Z952 Presence of prosthetic heart valve: Secondary | ICD-10-CM | POA: Diagnosis not present

## 2019-06-22 LAB — POCT INR: INR: 3.2 — AB (ref 2.0–3.0)

## 2019-06-22 NOTE — Patient Instructions (Signed)
Description   Continue taking 1.5 tablets daily except 2 tablets on Mondays and Wednesdays. Recheck in 6 weeks. Call us # [phone] [facility] with any medication changes or concerns.      

## 2019-07-03 ENCOUNTER — Other Ambulatory Visit: Payer: Self-pay | Admitting: Cardiovascular Disease

## 2019-07-03 DIAGNOSIS — I3489 Other nonrheumatic mitral valve disorders: Secondary | ICD-10-CM

## 2019-07-03 DIAGNOSIS — I10 Essential (primary) hypertension: Secondary | ICD-10-CM

## 2019-08-10 ENCOUNTER — Other Ambulatory Visit: Payer: Self-pay

## 2019-08-10 ENCOUNTER — Ambulatory Visit (INDEPENDENT_AMBULATORY_CARE_PROVIDER_SITE_OTHER): Payer: Medicare Other

## 2019-08-10 DIAGNOSIS — Z952 Presence of prosthetic heart valve: Secondary | ICD-10-CM | POA: Diagnosis not present

## 2019-08-10 DIAGNOSIS — Z5181 Encounter for therapeutic drug level monitoring: Secondary | ICD-10-CM | POA: Diagnosis not present

## 2019-08-10 LAB — POCT INR: INR: 3.6 — AB (ref 2.0–3.0)

## 2019-08-10 NOTE — Patient Instructions (Addendum)
Hold today and then continue taking 1.5 tablets daily except 2 tablets on Mondays and Wednesdays. Recheck in 5 weeks. Call us # [phone] [facility] with any medication changes or concerns.

## 2019-09-14 ENCOUNTER — Ambulatory Visit (INDEPENDENT_AMBULATORY_CARE_PROVIDER_SITE_OTHER): Payer: Medicare Other | Admitting: *Deleted

## 2019-09-14 ENCOUNTER — Other Ambulatory Visit: Payer: Self-pay

## 2019-09-14 DIAGNOSIS — Z952 Presence of prosthetic heart valve: Secondary | ICD-10-CM

## 2019-09-14 DIAGNOSIS — Z5181 Encounter for therapeutic drug level monitoring: Secondary | ICD-10-CM | POA: Diagnosis not present

## 2019-09-14 LAB — POCT INR: INR: 2.5 (ref 2.0–3.0)

## 2019-09-14 NOTE — Patient Instructions (Signed)
Description   Continue taking 1.5 tablets daily except 2 tablets on Mondays and Wednesdays. Recheck in 6 weeks. Call us # [phone] [facility] with any medication changes or concerns.

## 2019-10-25 DIAGNOSIS — E7849 Other hyperlipidemia: Secondary | ICD-10-CM | POA: Diagnosis not present

## 2019-10-25 DIAGNOSIS — I10 Essential (primary) hypertension: Secondary | ICD-10-CM | POA: Diagnosis not present

## 2019-10-25 DIAGNOSIS — E119 Type 2 diabetes mellitus without complications: Secondary | ICD-10-CM | POA: Diagnosis not present

## 2019-10-26 ENCOUNTER — Ambulatory Visit (INDEPENDENT_AMBULATORY_CARE_PROVIDER_SITE_OTHER): Payer: Medicare Other | Admitting: *Deleted

## 2019-10-26 ENCOUNTER — Other Ambulatory Visit: Payer: Self-pay

## 2019-10-26 DIAGNOSIS — Z5181 Encounter for therapeutic drug level monitoring: Secondary | ICD-10-CM | POA: Diagnosis not present

## 2019-10-26 DIAGNOSIS — Z952 Presence of prosthetic heart valve: Secondary | ICD-10-CM

## 2019-10-26 LAB — POCT INR: INR: 3.3 — AB (ref 2.0–3.0)

## 2019-10-26 NOTE — Patient Instructions (Signed)
Description   Continue taking 1.5 tablets daily except 2 tablets on Mondays and Wednesdays. Recheck in 6 weeks. Call us # [phone] [facility] with any medication changes or concerns.      

## 2019-12-07 ENCOUNTER — Ambulatory Visit (INDEPENDENT_AMBULATORY_CARE_PROVIDER_SITE_OTHER): Payer: Medicare Other | Admitting: *Deleted

## 2019-12-07 ENCOUNTER — Other Ambulatory Visit: Payer: Self-pay

## 2019-12-07 DIAGNOSIS — Z5181 Encounter for therapeutic drug level monitoring: Secondary | ICD-10-CM

## 2019-12-07 DIAGNOSIS — Z952 Presence of prosthetic heart valve: Secondary | ICD-10-CM

## 2019-12-07 LAB — POCT INR: INR: 3.2 — AB (ref 2.0–3.0)

## 2019-12-07 NOTE — Patient Instructions (Signed)
Description   Continue taking 1.5 tablets daily except 2 tablets on Mondays and Wednesdays. Recheck in 6 weeks. Call us # [phone] [facility] with any medication changes or concerns.

## 2019-12-22 ENCOUNTER — Other Ambulatory Visit: Payer: Self-pay | Admitting: Cardiovascular Disease

## 2019-12-22 DIAGNOSIS — I10 Essential (primary) hypertension: Secondary | ICD-10-CM

## 2019-12-22 DIAGNOSIS — I3489 Other nonrheumatic mitral valve disorders: Secondary | ICD-10-CM

## 2019-12-25 DIAGNOSIS — I10 Essential (primary) hypertension: Secondary | ICD-10-CM | POA: Diagnosis not present

## 2019-12-25 DIAGNOSIS — E119 Type 2 diabetes mellitus without complications: Secondary | ICD-10-CM | POA: Diagnosis not present

## 2019-12-25 DIAGNOSIS — E7849 Other hyperlipidemia: Secondary | ICD-10-CM | POA: Diagnosis not present

## 2020-01-20 ENCOUNTER — Other Ambulatory Visit: Payer: Self-pay | Admitting: Cardiovascular Disease

## 2020-01-20 DIAGNOSIS — I3489 Other nonrheumatic mitral valve disorders: Secondary | ICD-10-CM

## 2020-01-20 DIAGNOSIS — I10 Essential (primary) hypertension: Secondary | ICD-10-CM

## 2020-01-22 ENCOUNTER — Other Ambulatory Visit: Payer: Self-pay | Admitting: Cardiovascular Disease

## 2020-01-22 DIAGNOSIS — I3489 Other nonrheumatic mitral valve disorders: Secondary | ICD-10-CM

## 2020-01-22 DIAGNOSIS — I10 Essential (primary) hypertension: Secondary | ICD-10-CM

## 2020-01-25 DIAGNOSIS — I10 Essential (primary) hypertension: Secondary | ICD-10-CM | POA: Diagnosis not present

## 2020-01-25 DIAGNOSIS — E119 Type 2 diabetes mellitus without complications: Secondary | ICD-10-CM | POA: Diagnosis not present

## 2020-01-25 DIAGNOSIS — E7849 Other hyperlipidemia: Secondary | ICD-10-CM | POA: Diagnosis not present

## 2020-02-01 ENCOUNTER — Other Ambulatory Visit: Payer: Self-pay

## 2020-02-01 ENCOUNTER — Ambulatory Visit (INDEPENDENT_AMBULATORY_CARE_PROVIDER_SITE_OTHER): Payer: Medicare Other

## 2020-02-01 ENCOUNTER — Encounter (INDEPENDENT_AMBULATORY_CARE_PROVIDER_SITE_OTHER): Payer: Self-pay

## 2020-02-01 DIAGNOSIS — Z5181 Encounter for therapeutic drug level monitoring: Secondary | ICD-10-CM | POA: Diagnosis not present

## 2020-02-01 DIAGNOSIS — Z952 Presence of prosthetic heart valve: Secondary | ICD-10-CM | POA: Diagnosis not present

## 2020-02-01 LAB — POCT INR: INR: 3.7 — AB (ref 2.0–3.0)

## 2020-02-01 NOTE — Patient Instructions (Signed)
Description   Skip today's dosage of Warfarin, then resume same dosage 1.5 tablets daily except 2 tablets on Mondays and Wednesdays. Recheck in 5 weeks. Call us # [phone] [facility] with any medication changes or concerns.

## 2020-02-06 ENCOUNTER — Other Ambulatory Visit: Payer: Self-pay | Admitting: Cardiovascular Disease

## 2020-02-06 DIAGNOSIS — I10 Essential (primary) hypertension: Secondary | ICD-10-CM

## 2020-02-06 DIAGNOSIS — I348 Other nonrheumatic mitral valve disorders: Secondary | ICD-10-CM

## 2020-02-06 DIAGNOSIS — I3489 Other nonrheumatic mitral valve disorders: Secondary | ICD-10-CM

## 2020-02-09 ENCOUNTER — Other Ambulatory Visit: Payer: Self-pay | Admitting: Cardiovascular Disease

## 2020-02-09 DIAGNOSIS — I3489 Other nonrheumatic mitral valve disorders: Secondary | ICD-10-CM

## 2020-02-09 DIAGNOSIS — I10 Essential (primary) hypertension: Secondary | ICD-10-CM

## 2020-02-09 DIAGNOSIS — I348 Other nonrheumatic mitral valve disorders: Secondary | ICD-10-CM

## 2020-02-19 ENCOUNTER — Other Ambulatory Visit: Payer: Self-pay | Admitting: Cardiovascular Disease

## 2020-02-19 DIAGNOSIS — I3489 Other nonrheumatic mitral valve disorders: Secondary | ICD-10-CM

## 2020-02-19 DIAGNOSIS — I10 Essential (primary) hypertension: Secondary | ICD-10-CM

## 2020-02-19 DIAGNOSIS — I348 Other nonrheumatic mitral valve disorders: Secondary | ICD-10-CM

## 2020-02-21 ENCOUNTER — Telehealth: Payer: Self-pay | Admitting: Cardiovascular Disease

## 2020-02-21 ENCOUNTER — Other Ambulatory Visit: Payer: Self-pay | Admitting: Cardiovascular Disease

## 2020-02-21 DIAGNOSIS — I348 Other nonrheumatic mitral valve disorders: Secondary | ICD-10-CM

## 2020-02-21 DIAGNOSIS — I10 Essential (primary) hypertension: Secondary | ICD-10-CM

## 2020-02-21 DIAGNOSIS — I3489 Other nonrheumatic mitral valve disorders: Secondary | ICD-10-CM

## 2020-02-21 MED ORDER — TRIAMTERENE-HCTZ 37.5-25 MG PO TABS: ORAL_TABLET | ORAL | 0 refills | Status: DC

## 2020-02-21 NOTE — Telephone Encounter (Signed)
Pt's medication was sent to pt's pharmacy as requested. Confirmation received.  °

## 2020-02-21 NOTE — Telephone Encounter (Signed)
*  STAT* If patient is at the pharmacy, call can be transferred to refill team.   1. Which medications need to be refilled? (please list name of each medication and dose if known) triamterene  2. Which pharmacy/location (including street and city if local pharmacy) [facility]  3. Do they need a 30 day or 90 day supply? Patient would like 90

## 2020-02-25 ENCOUNTER — Encounter: Payer: Self-pay | Admitting: Cardiovascular Disease

## 2020-02-25 NOTE — Progress Notes (Signed)
Natasha Davis Date of Birth  08-28-1955   [address]    [address]    [place], [place]  [address]       Natasha Davis is a 65 year old female.  1.  Mitral stenosis 2.  Status post mitral valve  placement 3 . Chronic anticoagulation 4.  Hypertension 5.  History of CVA ( prior to being diagnoses with mitral stenosis) 6.  History of claudication 7.  Cigarette smoking  Natasha Davis has done well since I last saw her in December, 2011.  She's not having episodes of chest pain or shortness breath. She's been on Coumadin and her protime levels have been therapeutic.  She quit smoking but started back again.  May 04, 2012  Natasha Davis is doing well from a cardiac standpoint.   She is still smoking.   Jun 20, 2015:  Natasha Davis is seen for a follow up visit  - missed last years office visit.  Has been keeping her INR appts.  No cardiac issues.  BP and HR are good,  Still smoking   March 26, 2016: Natasha Davis is seen today for a follow up visit Has started back smoking   Aug. 29, 2019  Doing ok INR was 1.9 today - have been eating more salad  No CP or dyspnea Has mechanical MVR in 1998.   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  Still smokes   October 27 2018:   Natasha Davis is seen today for a follow-up visit.  She has a history of mitral valve replacement for mitral stenosis.  ECG now shows previous Ant. MI   ( not there on last years ECG  She denies any cp  Still smoking   Feb. 1, 2022:  Natasha Davis is seen today for follow up of her MS and MV replacement  Still smoking ,  Advised cessation .  INR is theraputic  INR levels are therapeutic.  Her last INR was 3.7.   Current Outpatient Medications on File Prior to Visit  Medication Sig Dispense Refill  . acetaminophen (TYLENOL ARTHRITIS PAIN) 650 MG CR tablet Take 650 mg by mouth every 8 (eight) hours as needed. Pain     . atorvastatin (LIPITOR) 20 MG tablet TAKE 1 TABLET BY MOUTH EVERY DAY 30 tablet 0  . levETIRAcetam (KEPPRA)  500 MG tablet Take 500 mg by mouth daily.    . metoprolol tartrate (LOPRESSOR) 50 MG tablet TAKE 1 TABLET BY MOUTH EVERY DAY 90 tablet 0  . triamterene-hydrochlorothiazide (MAXZIDE-25) 37.5-25 MG tablet TAKE 1 TABLET BY MOUTH EVERY DAY. Please keep upcoming appt in February 2022 with Dr. Elease Hashimoto before anymore refills. Thank you Final Attempt 30 tablet 0  . warfarin (COUMADIN) 5 MG tablet TAKE 1 & 1/2 TABLETS DAILY - EXCEPT ON MON, WED, AND FRI TAKE 2 TABS - OR AS DIRECTED BY PHYSICIAN 160 tablet 0   No current facility-administered medications on file prior to visit.    Allergies  Allergen Reactions  . Penicillins     Past Medical History:  Diagnosis Date  . Chronic anticoagulation   . Claudication ([facility])   . History of CVA (cerebrovascular accident)   . Hypertension   . Mitral stenosis   . Seizure ([facility])    HX    Past Surgical History:  Procedure Laterality Date  . CARDIAC CATHETERIZATION  06/01/96   NORMAL LEFT VENTRICULAR SYSTOLIC FUNCTION. EF 60%  . CESAREAN SECTION     X2  . MITRAL VALVE  REPLACEMENT      Social History   Tobacco Use  Smoking Status Current Every Day Smoker  . Packs/day: 1.00  . Types: Cigarettes  . Last attempt to quit: 01/25/2006  . Years since quitting: 14.0  Smokeless Tobacco Never Used    Social History   Substance and Sexual Activity  Alcohol Use No    Family History  Problem Relation Age of Onset  . Hypertension Mother   . Heart disease Father   . Hypertension Father   . Heart attack Father   . Stroke Brother     Reviw of Systems:  Reviewed in the HPI.  All other systems are negative.  Physical Exam: Blood pressure 134/80, pulse 88, height 4\' 11"  (1.499 m), weight 182 lb 9.6 oz (82.8 kg), SpO2 96 %.  GEN:  Well nourished, well developed in no acute distress HEENT: Normal NECK: No JVD; No carotid bruits LYMPHATICS: No lymphadenopathy CARDIAC: RR, mechanical S1,  RESPIRATORY:  Clear to auscultation without rales, wheezing or  rhonchi  ABDOMEN: Soft, non-tender, non-distended MUSCULOSKELETAL:  No edema; No deformity  SKIN: Warm and dry NEUROLOGIC:  Alert and oriented x 3   ECG:   Assessment / Plan:   1.  Mitral stenosis - s/p MVR , doing well    2.  Abnormal EKG.  myoview study last year was low risk   3 . Chronic anticoagulation - INR is theraputic   4.  Hypertension - BP is well controlled.   5.  History of CVA  6.  History of claudication 7.  Cigarette smoking -  Ive advised cessation again .    , MD  02/25/2020 9:14 PM    [facility] [address] [place],  [address] [place], [place]  [place] Pager [phone] Phone: [phone]; Fax: [phone]

## 2020-02-26 ENCOUNTER — Encounter: Payer: Self-pay | Admitting: Cardiovascular Disease

## 2020-02-26 ENCOUNTER — Encounter (INDEPENDENT_AMBULATORY_CARE_PROVIDER_SITE_OTHER): Payer: Self-pay

## 2020-02-26 ENCOUNTER — Other Ambulatory Visit: Payer: Self-pay

## 2020-02-26 ENCOUNTER — Ambulatory Visit: Payer: Medicare Other | Admitting: Cardiovascular Disease

## 2020-02-26 VITALS — BP 134/80 | HR 88 | Ht 59.0 in | Wt 182.6 lb

## 2020-02-26 DIAGNOSIS — Z5181 Encounter for therapeutic drug level monitoring: Secondary | ICD-10-CM

## 2020-02-26 DIAGNOSIS — I059 Rheumatic mitral valve disease, unspecified: Secondary | ICD-10-CM | POA: Diagnosis not present

## 2020-02-26 DIAGNOSIS — Z7901 Long term (current) use of anticoagulants: Secondary | ICD-10-CM | POA: Diagnosis not present

## 2020-02-26 DIAGNOSIS — Z952 Presence of prosthetic heart valve: Secondary | ICD-10-CM | POA: Diagnosis not present

## 2020-02-26 NOTE — Patient Instructions (Signed)
Medication Instructions:  No changes *If you need a refill on your cardiac medications before your next appointment, please call your pharmacy*   Lab Work: none If you have labs (blood work) drawn today and your tests are completely normal, you will receive your results only by: MyChart Message (if you have MyChart) OR A paper copy in the mail If you have any lab test that is abnormal or we need to change your treatment, we will call you to review the results.   Testing/Procedures: none   Follow-Up: At [facility], you and your health needs are our priority.  As part of our continuing mission to provide you with exceptional heart care, we have created designated Provider Care Teams.  These Care Teams include your primary Cardiologist (physician) and Advanced Practice Providers (APPs -  Physician Assistants and Nurse Practitioners) who all work together to provide you with the care you need, when you need it.  We recommend signing up for the patient portal called "MyChart".  Sign up information is provided on this After Visit Summary.  MyChart is used to connect with patients for Virtual Visits (Telemedicine).  Patients are able to view lab/test results, encounter notes, upcoming appointments, etc.  Non-urgent messages can be sent to your provider as well.   To learn more about what you can do with MyChart, go to [email or web address].com.    Your next appointment:   12 month(s)  The format for your next appointment:   In Person  Provider:   You may see Philip Nahser, MD or one of the following Advanced Practice Providers on your designated Care Team:   Scott Weaver, PA-C Vin Bhagat, PA-C   Other Instructions   

## 2020-03-07 ENCOUNTER — Other Ambulatory Visit: Payer: Self-pay

## 2020-03-07 ENCOUNTER — Ambulatory Visit (INDEPENDENT_AMBULATORY_CARE_PROVIDER_SITE_OTHER): Payer: Medicare Other

## 2020-03-07 DIAGNOSIS — Z952 Presence of prosthetic heart valve: Secondary | ICD-10-CM | POA: Diagnosis not present

## 2020-03-07 DIAGNOSIS — Z5181 Encounter for therapeutic drug level monitoring: Secondary | ICD-10-CM

## 2020-03-07 LAB — POCT INR: INR: 3.7 — AB (ref 2.0–3.0)

## 2020-03-07 NOTE — Patient Instructions (Signed)
Description   Skip today's dosage of Warfarin, then start taking 1.5 tablets daily except 2 tablets on Mondays. Recheck in 4 weeks. Call us # [phone] [facility] with any medication changes or concerns.

## 2020-03-13 ENCOUNTER — Other Ambulatory Visit: Payer: Self-pay | Admitting: Cardiovascular Disease

## 2020-03-13 DIAGNOSIS — I348 Other nonrheumatic mitral valve disorders: Secondary | ICD-10-CM

## 2020-03-13 DIAGNOSIS — I10 Essential (primary) hypertension: Secondary | ICD-10-CM

## 2020-03-13 DIAGNOSIS — I3489 Other nonrheumatic mitral valve disorders: Secondary | ICD-10-CM

## 2020-03-24 DIAGNOSIS — I10 Essential (primary) hypertension: Secondary | ICD-10-CM | POA: Diagnosis not present

## 2020-03-24 DIAGNOSIS — E119 Type 2 diabetes mellitus without complications: Secondary | ICD-10-CM | POA: Diagnosis not present

## 2020-03-24 DIAGNOSIS — E7849 Other hyperlipidemia: Secondary | ICD-10-CM | POA: Diagnosis not present

## 2020-03-28 ENCOUNTER — Other Ambulatory Visit: Payer: Self-pay | Admitting: Cardiovascular Disease

## 2020-03-28 DIAGNOSIS — I10 Essential (primary) hypertension: Secondary | ICD-10-CM

## 2020-03-28 DIAGNOSIS — I3489 Other nonrheumatic mitral valve disorders: Secondary | ICD-10-CM

## 2020-03-28 DIAGNOSIS — I348 Other nonrheumatic mitral valve disorders: Secondary | ICD-10-CM

## 2020-04-11 ENCOUNTER — Ambulatory Visit (INDEPENDENT_AMBULATORY_CARE_PROVIDER_SITE_OTHER): Payer: Medicare Other | Admitting: *Deleted

## 2020-04-11 ENCOUNTER — Other Ambulatory Visit: Payer: Self-pay

## 2020-04-11 DIAGNOSIS — Z952 Presence of prosthetic heart valve: Secondary | ICD-10-CM

## 2020-04-11 DIAGNOSIS — Z5181 Encounter for therapeutic drug level monitoring: Secondary | ICD-10-CM

## 2020-04-11 LAB — POCT INR: INR: 2.9 (ref 2.0–3.0)

## 2020-04-11 NOTE — Patient Instructions (Signed)
Description   Continue taking Warfarin 1.5 tablets daily except 2 tablets on Mondays. Recheck in 5 weeks. Call us # [phone] [facility] with any medication changes or concerns.

## 2020-04-12 ENCOUNTER — Other Ambulatory Visit: Payer: Self-pay | Admitting: Cardiovascular Disease

## 2020-04-12 DIAGNOSIS — I10 Essential (primary) hypertension: Secondary | ICD-10-CM

## 2020-04-12 DIAGNOSIS — I348 Other nonrheumatic mitral valve disorders: Secondary | ICD-10-CM

## 2020-04-12 DIAGNOSIS — I3489 Other nonrheumatic mitral valve disorders: Secondary | ICD-10-CM

## 2020-05-16 ENCOUNTER — Ambulatory Visit (INDEPENDENT_AMBULATORY_CARE_PROVIDER_SITE_OTHER): Payer: Medicare Other | Admitting: *Deleted

## 2020-05-16 ENCOUNTER — Encounter (INDEPENDENT_AMBULATORY_CARE_PROVIDER_SITE_OTHER): Payer: Self-pay

## 2020-05-16 ENCOUNTER — Other Ambulatory Visit: Payer: Self-pay

## 2020-05-16 DIAGNOSIS — Z952 Presence of prosthetic heart valve: Secondary | ICD-10-CM

## 2020-05-16 DIAGNOSIS — Z5181 Encounter for therapeutic drug level monitoring: Secondary | ICD-10-CM | POA: Diagnosis not present

## 2020-05-16 LAB — POCT INR: INR: 2.4 (ref 2.0–3.0)

## 2020-05-16 NOTE — Patient Instructions (Signed)
Description   Take 2 tablets of warfarin today and then Continue taking Warfarin 1.5 tablets daily except 2 tablets on Mondays. Recheck in 4 weeks. Call us # [phone] [facility] with any medication changes or concerns.

## 2020-06-16 ENCOUNTER — Ambulatory Visit (INDEPENDENT_AMBULATORY_CARE_PROVIDER_SITE_OTHER): Payer: Medicare Other | Admitting: *Deleted

## 2020-06-16 ENCOUNTER — Other Ambulatory Visit: Payer: Self-pay

## 2020-06-16 DIAGNOSIS — Z952 Presence of prosthetic heart valve: Secondary | ICD-10-CM

## 2020-06-16 DIAGNOSIS — Z5181 Encounter for therapeutic drug level monitoring: Secondary | ICD-10-CM

## 2020-06-16 LAB — POCT INR: INR: 1.9 — AB (ref 2.0–3.0)

## 2020-06-16 NOTE — Patient Instructions (Signed)
Description   Take 2.5 tablets of warfarin today and then start taking Warfarin 1.5 tablets daily except 2 tablets on Mondays and Fridays. Recheck in 3 weeks. Call us # [phone] [facility] with any medication changes or concerns.

## 2020-06-24 DIAGNOSIS — I10 Essential (primary) hypertension: Secondary | ICD-10-CM | POA: Diagnosis not present

## 2020-06-24 DIAGNOSIS — E119 Type 2 diabetes mellitus without complications: Secondary | ICD-10-CM | POA: Diagnosis not present

## 2020-06-24 DIAGNOSIS — E7849 Other hyperlipidemia: Secondary | ICD-10-CM | POA: Diagnosis not present

## 2020-07-11 ENCOUNTER — Other Ambulatory Visit: Payer: Self-pay

## 2020-07-11 ENCOUNTER — Ambulatory Visit: Payer: Medicare Other | Admitting: Pharmacist

## 2020-07-11 DIAGNOSIS — Z952 Presence of prosthetic heart valve: Secondary | ICD-10-CM

## 2020-07-11 DIAGNOSIS — Z5181 Encounter for therapeutic drug level monitoring: Secondary | ICD-10-CM | POA: Diagnosis not present

## 2020-07-11 LAB — POCT INR: INR: 2.8 (ref 2.0–3.0)

## 2020-07-11 NOTE — Patient Instructions (Signed)
Continue taking the dose you had been taking of Warfarin 1.5 tablets daily except 2 tablets on Mondays. Recheck in 3 weeks. Call us # [phone] [facility] with any medication changes or concerns.

## 2020-08-01 ENCOUNTER — Ambulatory Visit: Payer: Medicare Other | Admitting: Pharmacist

## 2020-08-01 ENCOUNTER — Other Ambulatory Visit: Payer: Self-pay

## 2020-08-01 DIAGNOSIS — Z5181 Encounter for therapeutic drug level monitoring: Secondary | ICD-10-CM

## 2020-08-01 DIAGNOSIS — Z952 Presence of prosthetic heart valve: Secondary | ICD-10-CM | POA: Diagnosis not present

## 2020-08-01 LAB — POCT INR: INR: 3.3 — AB (ref 2.0–3.0)

## 2020-08-01 NOTE — Patient Instructions (Signed)
Continue taking the dose you had been taking of Warfarin 1.5 tablets daily except 2 tablets on Mondays. Recheck in 4 weeks. Call us # [phone] [facility] with any medication changes or concerns.

## 2020-08-29 ENCOUNTER — Ambulatory Visit: Payer: Medicare Other

## 2020-08-29 ENCOUNTER — Encounter (INDEPENDENT_AMBULATORY_CARE_PROVIDER_SITE_OTHER): Payer: Self-pay

## 2020-08-29 ENCOUNTER — Other Ambulatory Visit: Payer: Self-pay

## 2020-08-29 DIAGNOSIS — Z5181 Encounter for therapeutic drug level monitoring: Secondary | ICD-10-CM

## 2020-08-29 DIAGNOSIS — Z952 Presence of prosthetic heart valve: Secondary | ICD-10-CM | POA: Diagnosis not present

## 2020-08-29 LAB — POCT INR: INR: 3.2 — AB (ref 2.0–3.0)

## 2020-08-29 NOTE — Patient Instructions (Signed)
Description   Continue taking of Warfarin 1.5 tablets daily except 2 tablets on Mondays. Recheck in 5 weeks. Call us # [phone] [facility] with any medication changes or concerns.

## 2020-09-24 DIAGNOSIS — E782 Mixed hyperlipidemia: Secondary | ICD-10-CM | POA: Diagnosis not present

## 2020-09-24 DIAGNOSIS — E119 Type 2 diabetes mellitus without complications: Secondary | ICD-10-CM | POA: Diagnosis not present

## 2020-09-24 DIAGNOSIS — I10 Essential (primary) hypertension: Secondary | ICD-10-CM | POA: Diagnosis not present

## 2020-10-03 ENCOUNTER — Other Ambulatory Visit: Payer: Self-pay

## 2020-10-03 ENCOUNTER — Ambulatory Visit: Payer: Medicare Other | Admitting: *Deleted

## 2020-10-03 DIAGNOSIS — Z952 Presence of prosthetic heart valve: Secondary | ICD-10-CM

## 2020-10-03 DIAGNOSIS — Z5181 Encounter for therapeutic drug level monitoring: Secondary | ICD-10-CM

## 2020-10-03 LAB — POCT INR: INR: 3 (ref 2.0–3.0)

## 2020-10-03 NOTE — Patient Instructions (Signed)
Description   Continue taking of Warfarin 1.5 tablets daily except 2 tablets on Mondays. Recheck in 6 weeks. Call us # [phone] [facility] with any medication changes or concerns.

## 2020-11-14 ENCOUNTER — Ambulatory Visit: Payer: Medicare Other

## 2020-11-14 ENCOUNTER — Other Ambulatory Visit: Payer: Self-pay

## 2020-11-14 DIAGNOSIS — Z5181 Encounter for therapeutic drug level monitoring: Secondary | ICD-10-CM

## 2020-11-14 DIAGNOSIS — Z952 Presence of prosthetic heart valve: Secondary | ICD-10-CM | POA: Diagnosis not present

## 2020-11-14 LAB — POCT INR: INR: 2.5 (ref 2.0–3.0)

## 2020-11-14 NOTE — Patient Instructions (Signed)
Description   Take 2 tablets today and then continue taking of Warfarin 1.5 tablets daily except 2 tablets on Mondays. Recheck in 6 weeks. Call us # [phone] [facility] with any medication changes or concerns.

## 2020-12-06 DIAGNOSIS — Z03818 Encounter for observation for suspected exposure to other biological agents ruled out: Secondary | ICD-10-CM | POA: Diagnosis not present

## 2020-12-06 DIAGNOSIS — R12 Heartburn: Secondary | ICD-10-CM | POA: Diagnosis not present

## 2020-12-06 DIAGNOSIS — J069 Acute upper respiratory infection, unspecified: Secondary | ICD-10-CM | POA: Diagnosis not present

## 2020-12-26 ENCOUNTER — Ambulatory Visit: Payer: Medicare Other

## 2020-12-26 ENCOUNTER — Other Ambulatory Visit: Payer: Self-pay

## 2020-12-26 DIAGNOSIS — Z952 Presence of prosthetic heart valve: Secondary | ICD-10-CM

## 2020-12-26 DIAGNOSIS — Z5181 Encounter for therapeutic drug level monitoring: Secondary | ICD-10-CM | POA: Diagnosis not present

## 2020-12-26 LAB — POCT INR: INR: 3.7 — AB (ref 2.0–3.0)

## 2020-12-26 NOTE — Patient Instructions (Signed)
Description   Eat greens today and continue taking of Warfarin 1.5 tablets daily except 2 tablets on Mondays. Recheck in 6 weeks. Call us # [phone] [facility] with any medication changes or concerns.

## 2021-02-06 ENCOUNTER — Ambulatory Visit: Payer: Medicare Other

## 2021-02-06 ENCOUNTER — Other Ambulatory Visit: Payer: Self-pay

## 2021-02-06 DIAGNOSIS — Z5181 Encounter for therapeutic drug level monitoring: Secondary | ICD-10-CM | POA: Diagnosis not present

## 2021-02-06 DIAGNOSIS — Z952 Presence of prosthetic heart valve: Secondary | ICD-10-CM

## 2021-02-06 LAB — POCT INR: INR: 3.1 — AB (ref 2.0–3.0)

## 2021-02-06 NOTE — Patient Instructions (Signed)
-   continue taking of Warfarin 1.5 tablets daily except 2 tablets on Mondays. - Recheck in 6 weeks.  Call us # [phone] [facility] with any medication changes or concerns.

## 2021-03-19 ENCOUNTER — Other Ambulatory Visit: Payer: Self-pay | Admitting: Cardiovascular Disease

## 2021-03-19 DIAGNOSIS — I10 Essential (primary) hypertension: Secondary | ICD-10-CM

## 2021-03-19 DIAGNOSIS — I3489 Other nonrheumatic mitral valve disorders: Secondary | ICD-10-CM

## 2021-03-20 ENCOUNTER — Ambulatory Visit: Payer: Medicare Other

## 2021-03-20 ENCOUNTER — Other Ambulatory Visit: Payer: Self-pay

## 2021-03-20 DIAGNOSIS — Z952 Presence of prosthetic heart valve: Secondary | ICD-10-CM

## 2021-03-20 DIAGNOSIS — Z5181 Encounter for therapeutic drug level monitoring: Secondary | ICD-10-CM

## 2021-03-20 LAB — POCT INR: INR: 4.3 — AB (ref 2.0–3.0)

## 2021-03-20 NOTE — Patient Instructions (Signed)
Description   - Hold today's dose and then continue taking of Warfarin 1.5 tablets daily except 2 tablets on Mondays. - Recheck in 2 weeks.  Call us # [phone] [facility] with any medication changes or concerns.

## 2021-03-24 ENCOUNTER — Other Ambulatory Visit: Payer: Self-pay | Admitting: Cardiovascular Disease

## 2021-03-24 DIAGNOSIS — I3489 Other nonrheumatic mitral valve disorders: Secondary | ICD-10-CM

## 2021-03-24 DIAGNOSIS — I10 Essential (primary) hypertension: Secondary | ICD-10-CM

## 2021-04-03 ENCOUNTER — Other Ambulatory Visit: Payer: Self-pay

## 2021-04-03 ENCOUNTER — Ambulatory Visit: Payer: Medicare Other

## 2021-04-03 DIAGNOSIS — Z952 Presence of prosthetic heart valve: Secondary | ICD-10-CM | POA: Diagnosis not present

## 2021-04-03 DIAGNOSIS — Z5181 Encounter for therapeutic drug level monitoring: Secondary | ICD-10-CM | POA: Diagnosis not present

## 2021-04-03 LAB — POCT INR: INR: 3.4 — AB (ref 2.0–3.0)

## 2021-04-03 NOTE — Patient Instructions (Signed)
Description   ?Continue taking of Warfarin 1.5 tablets daily except 2 tablets on Mondays.  Recheck in 4 weeks. Call us # [phone] [facility] with any medication changes or concerns.  ?  ?  ?

## 2021-04-11 ENCOUNTER — Other Ambulatory Visit: Payer: Self-pay | Admitting: Cardiovascular Disease

## 2021-04-11 DIAGNOSIS — I3489 Other nonrheumatic mitral valve disorders: Secondary | ICD-10-CM

## 2021-04-11 DIAGNOSIS — I10 Essential (primary) hypertension: Secondary | ICD-10-CM

## 2021-04-16 ENCOUNTER — Other Ambulatory Visit: Payer: Self-pay | Admitting: Cardiovascular Disease

## 2021-04-16 DIAGNOSIS — I3489 Other nonrheumatic mitral valve disorders: Secondary | ICD-10-CM

## 2021-04-16 DIAGNOSIS — I10 Essential (primary) hypertension: Secondary | ICD-10-CM

## 2021-04-23 ENCOUNTER — Other Ambulatory Visit: Payer: Self-pay | Admitting: Cardiovascular Disease

## 2021-04-23 DIAGNOSIS — I10 Essential (primary) hypertension: Secondary | ICD-10-CM

## 2021-04-23 DIAGNOSIS — I3489 Other nonrheumatic mitral valve disorders: Secondary | ICD-10-CM

## 2021-04-24 ENCOUNTER — Other Ambulatory Visit: Payer: Self-pay | Admitting: Cardiovascular Disease

## 2021-04-24 DIAGNOSIS — I3489 Other nonrheumatic mitral valve disorders: Secondary | ICD-10-CM

## 2021-04-24 DIAGNOSIS — I10 Essential (primary) hypertension: Secondary | ICD-10-CM

## 2021-05-05 ENCOUNTER — Other Ambulatory Visit: Payer: Self-pay

## 2021-05-05 ENCOUNTER — Other Ambulatory Visit: Payer: Self-pay | Admitting: *Deleted

## 2021-05-05 DIAGNOSIS — I10 Essential (primary) hypertension: Secondary | ICD-10-CM

## 2021-05-05 DIAGNOSIS — I3489 Other nonrheumatic mitral valve disorders: Secondary | ICD-10-CM

## 2021-05-05 MED ORDER — WARFARIN SODIUM 5 MG PO TABS: ORAL_TABLET | ORAL | 0 refills | Status: DC

## 2021-05-05 MED ORDER — ATORVASTATIN CALCIUM 20 MG PO TABS: ORAL_TABLET | ORAL | 0 refills | Status: DC

## 2021-05-05 MED ORDER — TRIAMTERENE-HCTZ 37.5-25 MG PO TABS: ORAL_TABLET | ORAL | 0 refills | Status: DC

## 2021-05-05 NOTE — Telephone Encounter (Signed)
Pt was due to be seen 05/01/21, overdue for follow-up.  Pt rescheduled appt for 05/11/21 when she sees Dr Elease Hashimoto in office.  Will refill x 1 only until pt follows up. ?

## 2021-05-11 ENCOUNTER — Ambulatory Visit (INDEPENDENT_AMBULATORY_CARE_PROVIDER_SITE_OTHER): Payer: Medicare HMO | Admitting: Pharmacist

## 2021-05-11 ENCOUNTER — Ambulatory Visit: Payer: Medicare HMO | Admitting: Cardiovascular Disease

## 2021-05-11 ENCOUNTER — Encounter: Payer: Self-pay | Admitting: Cardiovascular Disease

## 2021-05-11 VITALS — BP 124/80 | HR 78 | Ht 59.0 in | Wt 196.4 lb

## 2021-05-11 DIAGNOSIS — I739 Peripheral vascular disease, unspecified: Secondary | ICD-10-CM

## 2021-05-11 DIAGNOSIS — I10 Essential (primary) hypertension: Secondary | ICD-10-CM

## 2021-05-11 DIAGNOSIS — Z952 Presence of prosthetic heart valve: Secondary | ICD-10-CM | POA: Diagnosis not present

## 2021-05-11 DIAGNOSIS — I3489 Other nonrheumatic mitral valve disorders: Secondary | ICD-10-CM | POA: Diagnosis not present

## 2021-05-11 DIAGNOSIS — Z5181 Encounter for therapeutic drug level monitoring: Secondary | ICD-10-CM | POA: Diagnosis not present

## 2021-05-11 LAB — POCT INR: INR: 4.8 — AB (ref 2.0–3.0)

## 2021-05-11 MED ORDER — ATORVASTATIN CALCIUM 20 MG PO TABS: ORAL_TABLET | ORAL | 3 refills | Status: DC

## 2021-05-11 MED ORDER — TRIAMTERENE-HCTZ 37.5-25 MG PO TABS: ORAL_TABLET | ORAL | 3 refills | Status: DC

## 2021-05-11 MED ORDER — METOPROLOL TARTRATE 50 MG PO TABS: 50.0000 mg | ORAL_TABLET | Freq: Every day | ORAL | 3 refills | Status: DC

## 2021-05-11 NOTE — Patient Instructions (Signed)
Medication Instructions:  ?Your physician recommends that you continue on your current medications as directed. Please refer to the Current Medication list given to you today. ? ?*If you need a refill on your cardiac medications before your next appointment, please call your pharmacy* ? ?Lab Work: ?NONE ? ?Testing/Procedures: ?Your physician has requested that you have a lower extremity arterial duplex. This test is an ultrasound of the arteries in the legs or arms. It looks at arterial blood flow in the legs and arms. Allow one hour for Lower and Upper Arterial scans. There are no restrictions or special instructions  ? ?Follow-Up: ?At [facility], you and your health needs are our priority.  As part of our continuing mission to provide you with exceptional heart care, we have created designated Provider Care Teams.  These Care Teams include your primary Cardiologist (physician) and Advanced Practice Providers (APPs -  Physician Assistants and Nurse Practitioners) who all work together to provide you with the care you need, when you need it. ? ?We recommend signing up for the patient portal called "MyChart".  Sign up information is provided on this After Visit Summary.  MyChart is used to connect with patients for Virtual Visits (Telemedicine).  Patients are able to view lab/test results, encounter notes, upcoming appointments, etc.  Non-urgent messages can be sent to your provider as well.   ?To learn more about what you can do with MyChart, go to [email or web address].   ? ?Your next appointment:   ?1 year(s) ? ?The format for your next appointment:   ?In Person ? ?Provider:   ?Kristeen Miss, MD  or Chelsea Aus, PA-C, Eligha Bridegroom, NP, or Tereso Newcomer, PA-C    ? ?Other Instructions ?Important Information About Sugar ? ? ? ? ?  ?

## 2021-05-11 NOTE — Patient Instructions (Signed)
Hold warfarin today, tomorrow take only 1/2 tablet then continue taking of Warfarin 1.5 tablets daily except 2 tablets on Mondays.  Recheck in 2 weeks. Call us # [phone] [facility] with any medication changes or concerns.  ?

## 2021-05-11 NOTE — Progress Notes (Signed)
? ? ? ?Natasha Davis ?Date of Birth  1955-07-28 ? ? ?[address]    [address]    ?[place], [place]  [address]    ? ? ? ?Natasha Davis is a 66 year old female. ? ?1.  Mitral stenosis ?2.  Status post mitral valve  placement ?3 . Chronic anticoagulation ?4.  Hypertension ?5.  History of CVA ( prior to being diagnoses with mitral stenosis) ?6.  History of claudication ?7.  Cigarette smoking ? ?Natasha Davis has done well since I last saw her in December, 2011.  She's not having episodes of chest pain or shortness breath. She's been on Coumadin and her protime levels have been therapeutic.  She quit smoking but started back again. ? ?May 04, 2012 ? ?Natasha Davis is doing well from a cardiac standpoint.   She is still smoking.  ? ?Jun 20, 2015: ? ?Natasha Davis is seen for a follow up visit  - missed last years office visit.  Has been keeping her INR appts.  ?No cardiac issues.  ?BP and HR are good,  ?Still smoking  ? ?March 26, 2016: ?Natasha Davis is seen today for a follow up visit ?Has started back smoking  ? ?Aug. 29, 2019 ? ?Doing ok ?INR was 1.9 today - have been eating more salad  ?No CP or dyspnea ?Has mechanical MVR in 1998.  ? 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  ?Still smokes  ? ?October 27 2018:  ? ?Natasha Davis is seen today for a follow-up visit.  She has a history of mitral valve replacement for mitral stenosis. ? ?ECG now shows previous Ant. MI   ( not there on last years ECG  ?She denies any cp  ?Still smoking  ? ?Feb. 1, 2022: ? ?Natasha Davis is seen today for follow up of her MS and MV replacement  ?Still smoking ,  Advised cessation .  ?INR is theraputic  ?INR levels are therapeutic.  Her last INR was 3.7. ? ?May 11, 2021: ?Natasha Davis is seen today for follow-up of her mitral stenosis and mitral valve replacement. ?Still smoking  ?Having issues with claudication - esp in her left leg ?Advised her to stop smoking  ?Will get lower extremity arterial duplex.  ? ?Current Outpatient Medications on File Prior to Visit  ?Medication Sig  Dispense Refill  ? acetaminophen (TYLENOL) 650 MG CR tablet Take 650 mg by mouth every 8 (eight) hours as needed. Pain    ? atorvastatin (LIPITOR) 20 MG tablet TAKE 1 TABLET BY MOUTH DAILY. PT NEEDS TO MAKE APPT WITH PROVIDER FOR FURTHER REFILLS 30 tablet 0  ? levETIRAcetam (KEPPRA) 500 MG tablet Take 500 mg by mouth daily.    ? metoprolol tartrate (LOPRESSOR) 50 MG tablet Take 1 tablet (50 mg total) by mouth daily. Please schedule appt for future refills. Thank you 90 tablet 0  ? omeprazole (PRILOSEC) 20 MG capsule Take 40 mg by mouth daily.    ? triamterene-hydrochlorothiazide (MAXZIDE-25) 37.5-25 MG tablet TAKE 1 TABLET BY MOUTH EVERY DAY. Please keep upcoming appt with Dr. Elease Hashimoto in April 2023 before anymore refills. Thank you Final Attempt 30 tablet 0  ? warfarin (COUMADIN) 5 MG tablet Take 1.5 to 2 tablets by mouth once daily as directed by Coumadin Clinic 150 tablet 0  ? ?No current facility-administered medications on file prior to visit.  ? ? ?Allergies  ?Allergen Reactions  ? Penicillins   ? ? ?Past Medical History:  ?Diagnosis Date  ? Chronic anticoagulation   ?  Claudication [facility])   ? History of CVA (cerebrovascular accident)   ? Hypertension   ? Mitral stenosis   ? Seizure (HCC)   ? HX  ? ? ?Past Surgical History:  ?Procedure Laterality Date  ? CARDIAC CATHETERIZATION  06/01/96  ? NORMAL LEFT VENTRICULAR SYSTOLIC FUNCTION. EF 60%  ? CESAREAN SECTION    ? X2  ? MITRAL VALVE REPLACEMENT    ? ? ?Social History  ? ?Tobacco Use  ?Smoking Status Every Day  ? Packs/day: 1.00  ? Types: Cigarettes  ? Last attempt to quit: 01/25/2006  ? Years since quitting: 15.3  ?Smokeless Tobacco Never  ? ? ?Social History  ? ?Substance and Sexual Activity  ?Alcohol Use No  ? ? ?Family History  ?Problem Relation Age of Onset  ? Hypertension Mother   ? Heart disease Father   ? Hypertension Father   ? Heart attack Father   ? Stroke Brother   ? ? ?Reviw of Systems:  ?Reviewed in the HPI.  All other systems are negative. ? ? ?Physical  Exam: ?Blood pressure 124/80, pulse 78, height 4\' 11"  (1.499 m), weight 196 lb 6.4 oz (89.1 kg), SpO2 99 %. ? ?GEN:  middle age female  ?HEENT: Normal ?NECK: No JVD; No carotid bruits ?LYMPHATICS: No lymphadenopathy ?CARDIAC: RRR Mechanical S1, normal S2  ?RESPIRATORY:  Clear to auscultation without rales, wheezing or rhonchi  ?ABDOMEN: Soft, non-tender, non-distended ?MUSCULOSKELETAL:  No edema; No deformity  ?SKIN: Warm and dry ?NEUROLOGIC:  Alert and oriented x 3 ? ? ? ?ECG: May 11, 2021: Sinus rhythm at 78.  SHe has a first-degree AV block. ? ? ?Assessment / Plan:  ? ?1.  Mitral stenosis - s/p MVR , doing well   NR today  ? ? ?  ? ?3 . Chronic anticoagulation - cont coumadin  ? ? ?4.  Hypertension -  BP is well controlled.  ? ?5.  History of CVA - piror to her MV surgery  ? ?6.  History of claudication - will get lower extremity arterial duplex.  ? ?7.  Cigarette smoking -   I have advised smoking cessation. ? ? ? ?May 13, 2021, MD  ?05/11/2021 3:36 PM    ?[facility] ?[address] [place],  [address] ?[place], [place]  [place] ?Pager [phone] ?Phone: [phone]; Fax: [phone]  ? ? ? ?

## 2021-05-22 ENCOUNTER — Other Ambulatory Visit: Payer: Self-pay | Admitting: Cardiovascular Disease

## 2021-05-22 DIAGNOSIS — I739 Peripheral vascular disease, unspecified: Secondary | ICD-10-CM

## 2021-05-27 ENCOUNTER — Ambulatory Visit (INDEPENDENT_AMBULATORY_CARE_PROVIDER_SITE_OTHER): Payer: Medicare HMO

## 2021-05-27 DIAGNOSIS — Z952 Presence of prosthetic heart valve: Secondary | ICD-10-CM

## 2021-05-27 DIAGNOSIS — Z5181 Encounter for therapeutic drug level monitoring: Secondary | ICD-10-CM | POA: Diagnosis not present

## 2021-05-27 LAB — POCT INR: INR: 3.4 — AB (ref 2.0–3.0)

## 2021-05-27 NOTE — Patient Instructions (Signed)
Description   ?Continue taking of Warfarin 1.5 tablets daily except 2 tablets on Mondays.  Recheck in 3 weeks.  ?Stay consistent with greens each week (3 times per week)  ?Call us # [phone] Coumadin Clinic with any medication changes or concerns.  ?  ?   ?

## 2021-05-29 ENCOUNTER — Telehealth: Payer: Self-pay

## 2021-05-29 ENCOUNTER — Ambulatory Visit (HOSPITAL_COMMUNITY)
Admission: RE | Admit: 2021-05-29 | Discharge: 2021-05-29 | Disposition: A | Discharge status: Home / Self Care | Payer: Medicare HMO | Source: Ambulatory Visit | Attending: Cardiovascular Disease | Admitting: Cardiovascular Disease

## 2021-05-29 DIAGNOSIS — I739 Peripheral vascular disease, unspecified: Secondary | ICD-10-CM | POA: Diagnosis not present

## 2021-05-29 NOTE — Telephone Encounter (Signed)
Called and spoke with patient. Referral placed at this time. No further questions. ?

## 2021-05-29 NOTE — Telephone Encounter (Signed)
-----   Message from Vesta Mixer, MD sent at 05/29/2021  3:08 PM EDT ----- ?Please refer to our PV clinic at [facility] for consultation for her PAD  ? ?

## 2021-05-31 ENCOUNTER — Other Ambulatory Visit: Payer: Self-pay | Admitting: Cardiovascular Disease

## 2021-05-31 DIAGNOSIS — I3489 Other nonrheumatic mitral valve disorders: Secondary | ICD-10-CM

## 2021-05-31 DIAGNOSIS — I10 Essential (primary) hypertension: Secondary | ICD-10-CM

## 2021-06-15 NOTE — Progress Notes (Unsigned)
Cardiology Office Note   Date:  06/16/2021   ID:  KATTALEYA ALIA, DOB [birth year], MRN [id number]  PCP:  Assunta Found, MD  Cardiologist:  Dr. Elease Hashimoto  No chief complaint on file.     History of Present Illness: Natasha Davis is a 65 y.o. female who was referred by Dr. Elease Hashimoto for evaluation management of peripheral arterial disease.  She has known history of mitral stenosis status post mitral valve placement, chronic anticoagulation, essential hypertension, history of CVA and tobacco use. 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  She did have embolectomy done at that time.  She had her mitral valve replacement surgery and was placed on warfarin since that time.  She was seen by vascular surgery in 2009 for concerns of left leg claudication.  She underwent angiography at that time which showed occluded left popliteal artery with collaterals.  She was treated medically with no revascularization.  The patient reported bilateral leg claudication recently worse on the left side.  She underwent lower extremity arterial Doppler which showed an ABI of 1.05 on the right and 0.75 on the left.  Duplex showed mostly below the knee disease.  Claudication happens after moderate exertion.  No symptoms on the right side.  No rest pain or lower extremity ulceration.    Past Medical History:  Diagnosis Date   Chronic anticoagulation    Claudication [facility])    History of CVA (cerebrovascular accident)    Hypertension    Mitral stenosis    Seizure (HCC)    HX    Past Surgical History:  Procedure Laterality Date   CARDIAC CATHETERIZATION  06/01/96   NORMAL LEFT VENTRICULAR SYSTOLIC FUNCTION. EF 60%   CESAREAN SECTION     X2   MITRAL VALVE REPLACEMENT       Current Outpatient Medications  Medication Sig Dispense Refill   acetaminophen (TYLENOL) 650 MG CR tablet  Take 650 mg by mouth every 8 (eight) hours as needed. Pain     atorvastatin (LIPITOR) 20 MG tablet TAKE 1 TABLET BY MOUTH DAILY. 90 tablet 3   levETIRAcetam (KEPPRA) 500 MG tablet Take 500 mg by mouth daily.     metoprolol tartrate (LOPRESSOR) 50 MG tablet Take 1 tablet (50 mg total) by mouth daily. 90 tablet 3   omeprazole (PRILOSEC) 20 MG capsule Take 40 mg by mouth daily.     triamterene-hydrochlorothiazide (MAXZIDE-25) 37.5-25 MG tablet TAKE 1 TABLET BY MOUTH EVERY DAY 90 tablet 1   warfarin (COUMADIN) 5 MG tablet Take 1.5 to 2 tablets by mouth once daily as directed by Coumadin Clinic 150 tablet 0   No current facility-administered medications for this visit.    Allergies:   Penicillins    Social History:  The patient  reports that she has been smoking cigarettes. She has been smoking an average of 1 pack per day. She has never used smokeless tobacco. She reports that she does not drink alcohol and does not use drugs.   Family History:  The patient's family history includes Heart attack in her father; Heart disease in her father; Hypertension in her father and mother; Stroke in her brother.    ROS:  Please see the history of present illness.   Otherwise, review of systems are positive for none.   All other systems are reviewed and negative.    PHYSICAL  EXAM: VS:  BP 130/68   Pulse 99   Ht 4\' 11"  (1.499 m)   Wt 197 lb 3.2 oz (89.4 kg)   SpO2 91%   BMI 39.83 kg/m  , BMI Body mass index is 39.83 kg/m. GEN: Well nourished, well developed, in no acute distress  HEENT: normal  Neck: no JVD, carotid bruits, or masses Cardiac: RRR with mechanical heart sounds; no rubs, or gallops,no edema.  2 out of 6 systolic murmur at the base. Respiratory:  clear to auscultation bilaterally, normal work of breathing GI: soft, nontender, nondistended, + BS MS: no deformity or atrophy  Skin: warm and dry, no rash Neuro:  Strength and sensation are intact Psych: euthymic mood, full  affect Vascular: Femoral pulses normal bilaterally.  Distal pulses are palpable on the right side but not let the left.   EKG:  EKG is not ordered today.    Recent Labs: No results found for requested labs within last 8760 hours.    Lipid Panel    Component Value Date/Time   CHOL 146 03/26/2016 1615   TRIG 95 03/26/2016 1615   HDL 50 03/26/2016 1615   CHOLHDL 2.9 03/26/2016 1615   CHOLHDL 2.3 06/20/2015 1203   VLDL 27 06/20/2015 1203   LDLCALC 77 03/26/2016 1615   LDLDIRECT 147.2 03/22/2011 1619      Wt Readings from Last 3 Encounters:  06/16/21 197 lb 3.2 oz (89.4 kg)  05/11/21 196 lb 6.4 oz (89.1 kg)  02/26/20 182 lb 9.6 oz (82.8 kg)          View : No data to display.            ASSESSMENT AND PLAN:  1.  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  The location is not optimal for endovascular intervention and her symptoms are currently not lifestyle limiting.  I discussed with her the natural history and management of claudication.  I recommend starting a walking exercise program and provided her with instructions.  At the present time, she has no evidence of critical limb ischemia.  2.  Tobacco use: I discussed with her the importance of smoking cessation.  3.  Status post mechanical mitral valve replacement.  Most recent echocardiogram in 2020 showed normal functioning valve.  4.  Hyperlipidemia: Currently on atorvastatin 20 mg once daily.  Recommended target LDL of less than 70.  She did have labs done with her primary care physician.    Disposition:   FU with me in 6 months  Signed,  2021, MD  06/16/2021 1:14 PM    [facility]

## 2021-06-16 ENCOUNTER — Encounter: Payer: Self-pay | Admitting: Cardiovascular Disease

## 2021-06-16 ENCOUNTER — Ambulatory Visit (INDEPENDENT_AMBULATORY_CARE_PROVIDER_SITE_OTHER): Payer: Medicare HMO | Admitting: Cardiovascular Disease

## 2021-06-16 VITALS — BP 130/68 | HR 99 | Ht 59.0 in | Wt 197.2 lb

## 2021-06-16 DIAGNOSIS — Z720 Tobacco use: Secondary | ICD-10-CM

## 2021-06-16 DIAGNOSIS — Z952 Presence of prosthetic heart valve: Secondary | ICD-10-CM

## 2021-06-16 DIAGNOSIS — I739 Peripheral vascular disease, unspecified: Secondary | ICD-10-CM

## 2021-06-16 DIAGNOSIS — E785 Hyperlipidemia, unspecified: Secondary | ICD-10-CM | POA: Diagnosis not present

## 2021-06-16 NOTE — Patient Instructions (Signed)
Medication Instructions:  No changes *If you need a refill on your cardiac medications before your next appointment, please call your pharmacy*   Lab Work: None ordered If you have labs (blood work) drawn today and your tests are completely normal, you will receive your results only by: MyChart Message (if you have MyChart) OR A paper copy in the mail If you have any lab test that is abnormal or we need to change your treatment, we will call you to review the results.   Testing/Procedures: None ordered   Follow-Up: At [facility], you and your health needs are our priority.  As part of our continuing mission to provide you with exceptional heart care, we have created designated Provider Care Teams.  These Care Teams include your primary Cardiologist (physician) and Advanced Practice Providers (APPs -  Physician Assistants and Nurse Practitioners) who all work together to provide you with the care you need, when you need it.  We recommend signing up for the patient portal called "MyChart".  Sign up information is provided on this After Visit Summary.  MyChart is used to connect with patients for Virtual Visits (Telemedicine).  Patients are able to view lab/test results, encounter notes, upcoming appointments, etc.  Non-urgent messages can be sent to your provider as well.   To learn more about what you can do with MyChart, go to [email or web address].    Your next appointment:   6 month(s)  The format for your next appointment:   In Person  Provider:   Dr. Arida   Other Instructions EXERCISE PROGRAM FOR INDIVIDUALS WITH  PERIPHERAL ARTERIAL DISEASE (PAD)   General Information:   Research in vascular exercise has demonstrated remarkable improvement in symptoms of leg pain (claudication) without expensive or invasive interventions. Regular walking programs are extremely helpful for patients with PAD and intermittent claudication.  These steps are designed to help you get  started with a safe and effective program to help you walk farther with less pain:   Walk at least three times a week (preferably every day).  Your goal is to build up to 30-45 minutes of total walking time (not counting rest breaks). It may take you several weeks to build up your exercise time starting at 5-10 minutes or whatever you can tolerate.  Walk as far as possible using moderate to maximal pain (7-8 on the scale below) as a signal to stop, and resume walking when the pain goes away.  On a treadmill, set the speed and grade at a level that brings on the claudication pain within 3 to 5 minutes. Walk at this rate until you experience claudication of moderate severity, rest until the pain improves, and then resume walking.  Over time, you will be able to walk longer at the designated speed and grade; workload should then be increased until you develop the pain within 3 to 5 minutes once again.  This regimen will induce a significant benefit. Studies have demonstrated that participants may be able to walk up to three or four times farther and have less leg pain, within twelve weeks, by following this protocol.  Pain Scale    0_____1_____2_____3_____4_____5_____6_____7_____8_____9_____10   No Pain                                   Moderate Pain                                 Maximal Pain    

## 2021-06-19 ENCOUNTER — Ambulatory Visit (INDEPENDENT_AMBULATORY_CARE_PROVIDER_SITE_OTHER): Payer: Medicare HMO | Admitting: *Deleted

## 2021-06-19 DIAGNOSIS — Z952 Presence of prosthetic heart valve: Secondary | ICD-10-CM

## 2021-06-19 DIAGNOSIS — Z5181 Encounter for therapeutic drug level monitoring: Secondary | ICD-10-CM | POA: Diagnosis not present

## 2021-06-19 LAB — POCT INR: INR: 2.9 (ref 2.0–3.0)

## 2021-06-19 NOTE — Patient Instructions (Signed)
Description   Continue taking of Warfarin 1.5 tablets daily except 2 tablets on Mondays.  Recheck in 4 weeks.  Stay consistent with greens each week (3 times per week)  Call us # [phone] Coumadin Clinic with any medication changes or concerns.

## 2021-07-10 DIAGNOSIS — Z0000 Encounter for general adult medical examination without abnormal findings: Secondary | ICD-10-CM | POA: Diagnosis not present

## 2021-07-10 DIAGNOSIS — F17200 Nicotine dependence, unspecified, uncomplicated: Secondary | ICD-10-CM | POA: Diagnosis not present

## 2021-07-10 DIAGNOSIS — Z1331 Encounter for screening for depression: Secondary | ICD-10-CM | POA: Diagnosis not present

## 2021-07-10 DIAGNOSIS — I059 Rheumatic mitral valve disease, unspecified: Secondary | ICD-10-CM | POA: Diagnosis not present

## 2021-07-10 DIAGNOSIS — E782 Mixed hyperlipidemia: Secondary | ICD-10-CM | POA: Diagnosis not present

## 2021-07-10 DIAGNOSIS — G40909 Epilepsy, unspecified, not intractable, without status epilepticus: Secondary | ICD-10-CM | POA: Diagnosis not present

## 2021-07-10 DIAGNOSIS — I82409 Acute embolism and thrombosis of unspecified deep veins of unspecified lower extremity: Secondary | ICD-10-CM | POA: Diagnosis not present

## 2021-07-10 DIAGNOSIS — I10 Essential (primary) hypertension: Secondary | ICD-10-CM | POA: Diagnosis not present

## 2021-07-10 DIAGNOSIS — Z6838 Body mass index (BMI) 38.0-38.9, adult: Secondary | ICD-10-CM | POA: Diagnosis not present

## 2021-07-10 DIAGNOSIS — I739 Peripheral vascular disease, unspecified: Secondary | ICD-10-CM | POA: Diagnosis not present

## 2021-07-10 DIAGNOSIS — E119 Type 2 diabetes mellitus without complications: Secondary | ICD-10-CM | POA: Diagnosis not present

## 2021-07-12 ENCOUNTER — Other Ambulatory Visit: Payer: Self-pay | Admitting: Cardiovascular Disease

## 2021-07-12 DIAGNOSIS — I10 Essential (primary) hypertension: Secondary | ICD-10-CM

## 2021-07-12 DIAGNOSIS — I3489 Other nonrheumatic mitral valve disorders: Secondary | ICD-10-CM

## 2021-07-17 ENCOUNTER — Ambulatory Visit (INDEPENDENT_AMBULATORY_CARE_PROVIDER_SITE_OTHER): Payer: Medicare HMO

## 2021-07-17 DIAGNOSIS — Z952 Presence of prosthetic heart valve: Secondary | ICD-10-CM

## 2021-07-17 DIAGNOSIS — Z5181 Encounter for therapeutic drug level monitoring: Secondary | ICD-10-CM | POA: Diagnosis not present

## 2021-07-17 LAB — POCT INR: INR: 3.4 — AB (ref 2.0–3.0)

## 2021-08-21 ENCOUNTER — Ambulatory Visit (INDEPENDENT_AMBULATORY_CARE_PROVIDER_SITE_OTHER): Payer: Medicare HMO

## 2021-08-21 DIAGNOSIS — Z5181 Encounter for therapeutic drug level monitoring: Secondary | ICD-10-CM | POA: Diagnosis not present

## 2021-08-21 DIAGNOSIS — Z952 Presence of prosthetic heart valve: Secondary | ICD-10-CM | POA: Diagnosis not present

## 2021-08-21 LAB — POCT INR: INR: 2.5 (ref 2.0–3.0)

## 2021-08-21 NOTE — Patient Instructions (Addendum)
Description   Take 2 tablets today and then continue taking of Warfarin 1.5 tablets daily except 2 tablets on Mondays.  Recheck in 6 weeks.  Stay consistent with greens each week (3 times per week)  Call us # [phone] Coumadin Clinic with any medication changes or concerns.

## 2021-08-25 ENCOUNTER — Other Ambulatory Visit: Payer: Self-pay | Admitting: Cardiovascular Disease

## 2021-08-25 DIAGNOSIS — Z952 Presence of prosthetic heart valve: Secondary | ICD-10-CM

## 2021-08-25 NOTE — Telephone Encounter (Signed)
Prescription refill request received for warfarin Lov: 06/16/21 Natasha Davis)  Next INR check: 10/05/21 Warfarin tablet strength: 5mg   Appropriate dose and refill sent to requested pharmacy.

## 2021-10-05 ENCOUNTER — Ambulatory Visit: Payer: Medicare HMO | Attending: Cardiovascular Disease | Admitting: *Deleted

## 2021-10-05 DIAGNOSIS — Z5181 Encounter for therapeutic drug level monitoring: Secondary | ICD-10-CM

## 2021-10-05 DIAGNOSIS — Z952 Presence of prosthetic heart valve: Secondary | ICD-10-CM | POA: Diagnosis not present

## 2021-10-05 LAB — POCT INR: INR: 3.4 — AB (ref 2.0–3.0)

## 2021-10-05 NOTE — Patient Instructions (Signed)
Description   Continue taking Warfarin 1.5 tablets daily except 2 tablets on Mondays.  Recheck in 6 weeks.  Stay consistent with greens each week (3 times per week)  Call us # [phone] Coumadin Clinic with any medication changes or concerns.

## 2021-11-13 ENCOUNTER — Ambulatory Visit: Payer: Medicare HMO | Attending: Cardiovascular Disease | Admitting: *Deleted

## 2021-11-13 DIAGNOSIS — Z952 Presence of prosthetic heart valve: Secondary | ICD-10-CM | POA: Diagnosis not present

## 2021-11-13 DIAGNOSIS — Z5181 Encounter for therapeutic drug level monitoring: Secondary | ICD-10-CM

## 2021-11-13 LAB — POCT INR: INR: 4.2 — AB (ref 2.0–3.0)

## 2021-11-13 NOTE — Patient Instructions (Addendum)
Description   Hold the warfarin dose for Today then Continue taking Warfarin 1.5 tablets daily except 2 tablets on Mondays.  Recheck in 2 weeks.  Stay consistent with greens each week (3 times per week)  Call us # [phone] Coumadin Clinic with any medication changes or concerns.

## 2021-11-27 ENCOUNTER — Ambulatory Visit: Payer: Medicare HMO | Attending: Cardiovascular Disease

## 2021-11-27 DIAGNOSIS — Z952 Presence of prosthetic heart valve: Secondary | ICD-10-CM | POA: Diagnosis not present

## 2021-11-27 DIAGNOSIS — Z5181 Encounter for therapeutic drug level monitoring: Secondary | ICD-10-CM | POA: Diagnosis not present

## 2021-11-27 LAB — POCT INR: INR: 4.3 — AB (ref 2.0–3.0)

## 2021-11-27 NOTE — Patient Instructions (Signed)
Hold the warfarin dose for Today then Continue taking Warfarin 1.5 tablets daily except 2 tablets on Mondays.  Recheck in 2 weeks.  Stay consistent with greens each week (3 times per week)  Call us # [phone] [facility]madin Clinic with any medication changes or concerns.

## 2021-12-11 ENCOUNTER — Ambulatory Visit: Payer: Medicare HMO | Attending: Cardiology

## 2021-12-11 DIAGNOSIS — Z5181 Encounter for therapeutic drug level monitoring: Secondary | ICD-10-CM

## 2021-12-11 DIAGNOSIS — Z952 Presence of prosthetic heart valve: Secondary | ICD-10-CM | POA: Diagnosis not present

## 2021-12-11 LAB — POCT INR: INR: 2.9 (ref 2.0–3.0)

## 2021-12-11 NOTE — Patient Instructions (Signed)
Continue taking Warfarin 1.5 tablets daily except 2 tablets on Mondays.  Recheck in 4 weeks.  Stay consistent with greens each week (3 times per week)  Call us # [phone] Coumadin Clinic with any medication changes or concerns.

## 2022-01-08 ENCOUNTER — Ambulatory Visit: Payer: Medicare HMO | Attending: Cardiovascular Disease

## 2022-01-08 DIAGNOSIS — Z5181 Encounter for therapeutic drug level monitoring: Secondary | ICD-10-CM

## 2022-01-08 DIAGNOSIS — Z952 Presence of prosthetic heart valve: Secondary | ICD-10-CM

## 2022-01-08 LAB — POCT INR: INR: 4.6 — AB (ref 2.0–3.0)

## 2022-01-08 NOTE — Patient Instructions (Signed)
Description   HOLD today's dose and then START taking Warfarin 1.5 tablets daily.    Recheck in 2 weeks.  Stay consistent with greens each week (3 times per week)  Call us # [phone]  Coumadin Clinic with any medication changes or concerns.

## 2022-01-12 ENCOUNTER — Ambulatory Visit: Payer: Medicare HMO | Attending: Cardiovascular Disease | Admitting: Cardiovascular Disease

## 2022-01-12 NOTE — Progress Notes (Deleted)
Cardiology Office Note   Date:  01/12/2022   ID:  Natasha Davis, DOB Jun 13, 1955, MRN [id number]  PCP:  Assunta Found, MD  Cardiologist:  Dr. Elease Hashimoto  No chief complaint on file.     History of Present Illness: Natasha Davis is a 66 y.o. female who was referred by Dr. Elease Hashimoto for evaluation management of peripheral arterial disease.  She has known history of mitral stenosis status post mitral valve placement, chronic anticoagulation, essential hypertension, history of CVA and tobacco use. 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  She did have embolectomy done at that time.  She had her mitral valve replacement surgery and was placed on warfarin since that time.  She was seen by vascular surgery in 2009 for concerns of left leg claudication.  She underwent angiography at that time which showed occluded left popliteal artery with collaterals.  She was treated medically with no revascularization.  The patient reported bilateral leg claudication recently worse on the left side.  She underwent lower extremity arterial Doppler which showed an ABI of 1.05 on the right and 0.75 on the left.  Duplex showed mostly below the knee disease.  Claudication happens after moderate exertion.  No symptoms on the right side.  No rest pain or lower extremity ulceration.    Past Medical History:  Diagnosis Date   Chronic anticoagulation    Claudication [facility])    History of CVA (cerebrovascular accident)    Hypertension    Mitral stenosis    Seizure (HCC)    HX    Past Surgical History:  Procedure Laterality Date   CARDIAC CATHETERIZATION  06/01/96   NORMAL LEFT VENTRICULAR SYSTOLIC FUNCTION. EF 60%   CESAREAN SECTION     X2   MITRAL VALVE REPLACEMENT       Current Outpatient Medications  Medication Sig Dispense Refill   acetaminophen (TYLENOL) 650 MG CR tablet  Take 650 mg by mouth every 8 (eight) hours as needed. Pain     atorvastatin (LIPITOR) 20 MG tablet TAKE 1 TABLET BY MOUTH DAILY. 90 tablet 3   levETIRAcetam (KEPPRA) 500 MG tablet Take 500 mg by mouth daily.     metoprolol tartrate (LOPRESSOR) 50 MG tablet TAKE 1 TABLET (50 MG TOTAL) BY MOUTH DAILY. PLEASE SCHEDULE APPT FOR FUTURE REFILLS. THANK YOU 90 tablet 3   omeprazole (PRILOSEC) 20 MG capsule Take 40 mg by mouth daily.     triamterene-hydrochlorothiazide (MAXZIDE-25) 37.5-25 MG tablet TAKE 1 TABLET BY MOUTH EVERY DAY 90 tablet 1   warfarin (COUMADIN) 5 MG tablet TAKE 1 AND 1/2 TO 2 TABLETS ONCE DAILY AS DIRECTED BY COUMADIN CLINIC 145 tablet 1   No current facility-administered medications for this visit.    Allergies:   Penicillins    Social History:  The patient  reports that she has been smoking cigarettes. She has been smoking an average of 1 pack per day. She has never used smokeless tobacco. She reports that she does not drink alcohol and does not use drugs.   Family History:  The patient's family history includes Heart attack in her father; Heart disease in her father; Hypertension in her father and mother; Stroke in her brother.    ROS:  Please see the history of present illness.   Otherwise, review of systems are positive for none.   All other systems  are reviewed and negative.    PHYSICAL EXAM: VS:  There were no vitals taken for this visit. , BMI There is no height or weight on file to calculate BMI. GEN: Well nourished, well developed, in no acute distress  HEENT: normal  Neck: no JVD, carotid bruits, or masses Cardiac: RRR with mechanical heart sounds; no rubs, or gallops,no edema.  2 out of 6 systolic murmur at the base. Respiratory:  clear to auscultation bilaterally, normal work of breathing GI: soft, nontender, nondistended, + BS MS: no deformity or atrophy  Skin: warm and dry, no rash Neuro:  Strength and sensation are intact Psych: euthymic mood, full  affect Vascular: Femoral pulses normal bilaterally.  Distal pulses are palpable on the right side but not let the left.   EKG:  EKG is not ordered today.    Recent Labs: No results found for requested labs within last 365 days.    Lipid Panel    Component Value Date/Time   CHOL 146 03/26/2016 1615   TRIG 95 03/26/2016 1615   HDL 50 03/26/2016 1615   CHOLHDL 2.9 03/26/2016 1615   CHOLHDL 2.3 06/20/2015 1203   VLDL 27 06/20/2015 1203   LDLCALC 77 03/26/2016 1615   LDLDIRECT 147.2 03/22/2011 1619      Wt Readings from Last 3 Encounters:  06/16/21 197 lb 3.2 oz (89.4 kg)  05/11/21 196 lb 6.4 oz (89.1 kg)  02/26/20 182 lb 9.6 oz (82.8 kg)          No data to display            ASSESSMENT AND PLAN:  1. 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  The location is not optimal for endovascular intervention and her symptoms are currently not lifestyle limiting.  I discussed with her the natural history and management of claudication.  I recommend starting a walking exercise program and provided her with instructions.  At the present time, she has no evidence of critical limb ischemia.  2.  Tobacco use: I discussed with her the importance of smoking cessation.  3.  Status post mechanical mitral valve replacement.  Most recent echocardiogram in 2020 showed normal functioning valve.  4.  Hyperlipidemia: Currently on atorvastatin 20 mg once daily.  Recommended target LDL of less than 70.  She did have labs done with her primary care physician.    Disposition:   FU with me in 6 months  Signed,  Lorine Bears, MD  01/12/2022 9:15 AM    [facility]

## 2022-01-17 DIAGNOSIS — R051 Acute cough: Secondary | ICD-10-CM | POA: Diagnosis not present

## 2022-01-17 DIAGNOSIS — Z1152 Encounter for screening for COVID-19: Secondary | ICD-10-CM | POA: Diagnosis not present

## 2022-01-17 DIAGNOSIS — J988 Other specified respiratory disorders: Secondary | ICD-10-CM | POA: Diagnosis not present

## 2022-01-18 ENCOUNTER — Encounter (HOSPITAL_COMMUNITY): Payer: Self-pay | Admitting: Emergency Medicine

## 2022-01-18 ENCOUNTER — Emergency Department (HOSPITAL_COMMUNITY): Payer: Medicare HMO

## 2022-01-18 ENCOUNTER — Other Ambulatory Visit: Payer: Self-pay

## 2022-01-18 ENCOUNTER — Emergency Department (HOSPITAL_COMMUNITY)
Admission: EM | Admit: 2022-01-18 | Discharge: 2022-01-18 | Disposition: A | Discharge status: Home / Self Care | Payer: Medicare HMO | Attending: Emergency Medicine | Admitting: Emergency Medicine

## 2022-01-18 DIAGNOSIS — I10 Essential (primary) hypertension: Secondary | ICD-10-CM | POA: Diagnosis not present

## 2022-01-18 DIAGNOSIS — J351 Hypertrophy of tonsils: Secondary | ICD-10-CM | POA: Diagnosis not present

## 2022-01-18 DIAGNOSIS — J36 Peritonsillar abscess: Secondary | ICD-10-CM | POA: Insufficient documentation

## 2022-01-18 DIAGNOSIS — R220 Localized swelling, mass and lump, head: Secondary | ICD-10-CM | POA: Diagnosis present

## 2022-01-18 DIAGNOSIS — R0602 Shortness of breath: Secondary | ICD-10-CM | POA: Insufficient documentation

## 2022-01-18 DIAGNOSIS — Z79899 Other long term (current) drug therapy: Secondary | ICD-10-CM | POA: Insufficient documentation

## 2022-01-18 DIAGNOSIS — Z1152 Encounter for screening for COVID-19: Secondary | ICD-10-CM | POA: Insufficient documentation

## 2022-01-18 DIAGNOSIS — J390 Retropharyngeal and parapharyngeal abscess: Secondary | ICD-10-CM | POA: Diagnosis not present

## 2022-01-18 DIAGNOSIS — L0291 Cutaneous abscess, unspecified: Secondary | ICD-10-CM

## 2022-01-18 DIAGNOSIS — R590 Localized enlarged lymph nodes: Secondary | ICD-10-CM | POA: Diagnosis not present

## 2022-01-18 LAB — CBC WITH DIFFERENTIAL/PLATELET
Abs Immature Granulocytes: 0.08 10*3/uL — ABNORMAL HIGH (ref 0.00–0.07)
Basophils Absolute: 0 10*3/uL (ref 0.0–0.1)
Basophils Relative: 0 %
Eosinophils Absolute: 0 10*3/uL (ref 0.0–0.5)
Eosinophils Relative: 0 %
HCT: 40.1 % (ref 36.0–46.0)
Hemoglobin: 12.6 g/dL (ref 12.0–15.0)
Immature Granulocytes: 1 %
Lymphocytes Relative: 7 %
Lymphs Abs: 1.3 10*3/uL (ref 0.7–4.0)
MCH: 31.5 pg (ref 26.0–34.0)
MCHC: 31.4 g/dL (ref 30.0–36.0)
MCV: 100.3 fL — ABNORMAL HIGH (ref 80.0–100.0)
Monocytes Absolute: 1.6 10*3/uL — ABNORMAL HIGH (ref 0.1–1.0)
Monocytes Relative: 9 %
Neutro Abs: 14.7 10*3/uL — ABNORMAL HIGH (ref 1.7–7.7)
Neutrophils Relative %: 83 %
Platelets: 233 10*3/uL (ref 150–400)
RBC: 4 MIL/uL (ref 3.87–5.11)
RDW: 13 % (ref 11.5–15.5)
WBC: 17.7 10*3/uL — ABNORMAL HIGH (ref 4.0–10.5)
nRBC: 0 % (ref 0.0–0.2)

## 2022-01-18 LAB — BASIC METABOLIC PANEL
Anion gap: 16 — ABNORMAL HIGH (ref 5–15)
BUN: 12 mg/dL (ref 8–23)
CO2: 20 mmol/L — ABNORMAL LOW (ref 22–32)
Calcium: 9.2 mg/dL (ref 8.9–10.3)
Chloride: 101 mmol/L (ref 98–111)
Creatinine, Ser: 0.79 mg/dL (ref 0.44–1.00)
GFR, Estimated: >60 mL/min (ref >60)
Glucose, Bld: 131 mg/dL — ABNORMAL HIGH (ref 70–99)
Potassium: 4.4 mmol/L (ref 3.5–5.1)
Sodium: 137 mmol/L (ref 135–145)

## 2022-01-18 LAB — RESP PANEL BY RT-PCR (RSV, FLU A&B, COVID)  RVPGX2
Influenza A by PCR: NEGATIVE
Influenza B by PCR: NEGATIVE
Resp Syncytial Virus by PCR: NEGATIVE
SARS Coronavirus 2 by RT PCR: NEGATIVE

## 2022-01-18 MED ORDER — AMOXICILLIN-POT CLAVULANATE 875-125 MG PO TABS: 1.0000 | ORAL_TABLET | Freq: Two times a day (BID) | ORAL | 0 refills | Status: DC

## 2022-01-18 MED ORDER — CEFAZOLIN SODIUM-DEXTROSE 1-4 GM/50ML-% IV SOLN: 1.0000 g | Freq: Once | INTRAVENOUS | Status: DC

## 2022-01-18 MED ORDER — FENTANYL CITRATE PF 50 MCG/ML IJ SOSY
50.0000 ug | PREFILLED_SYRINGE | INTRAMUSCULAR | Status: DC | PRN
  Administered 2022-01-18: 50 ug via INTRAVENOUS
  Filled 2022-01-18: qty 1

## 2022-01-18 MED ORDER — IOHEXOL 350 MG/ML SOLN
75.0000 mL | Freq: Once | INTRAVENOUS | Status: AC | PRN
  Administered 2022-01-18: 75 mL via INTRAVENOUS

## 2022-01-18 MED ORDER — DEXAMETHASONE SODIUM PHOSPHATE 10 MG/ML IJ SOLN
6.0000 mg | Freq: Once | INTRAMUSCULAR | Status: AC
  Administered 2022-01-18: 6 mg via INTRAVENOUS
  Filled 2022-01-18: qty 1

## 2022-01-18 MED ORDER — CHLORHEXIDINE GLUCONATE 0.12 % MT SOLN: 15.0000 mL | Freq: Two times a day (BID) | OROMUCOSAL | 0 refills | Status: DC

## 2022-01-18 MED ORDER — EPINEPHRINE 0.3 MG/0.3ML IJ SOAJ
0.3000 mg | Freq: Once | INTRAMUSCULAR | Status: AC
  Administered 2022-01-18: 0.3 mg via INTRAMUSCULAR
  Filled 2022-01-18: qty 0.3

## 2022-01-18 MED ORDER — LIDOCAINE-EPINEPHRINE 1 %-1:100000 IJ SOLN
30.0000 mL | Freq: Once | INTRAMUSCULAR | Status: AC
  Administered 2022-01-18: 30 mL via INTRADERMAL
  Filled 2022-01-18: qty 1

## 2022-01-18 MED ORDER — DIPHENHYDRAMINE HCL 50 MG/ML IJ SOLN
50.0000 mg | Freq: Once | INTRAMUSCULAR | Status: AC
  Administered 2022-01-18: 50 mg via INTRAVENOUS
  Filled 2022-01-18: qty 1

## 2022-01-18 NOTE — ED Triage Notes (Signed)
Pt stated, flu like symptoms on Thursday, a cough , not able to swallow good especially if I lay down. Getting worse to swallow.

## 2022-01-18 NOTE — ED Notes (Signed)
Pt is spitting up into emesis bag.  MD Doran Durand has been notified and aware.  No new orders at this time.

## 2022-01-18 NOTE — Consult Note (Signed)
ENT CONSULT:  Reason for Consult: left PTA  Referring Physician:  ED Attending  HPI: Natasha Davis is an 66 y.o. female who presents to the [facility] with several day hx of increasing throat pain, poor PO intake, voice changes. ED workup significant for leukocytosis and large 3cm left PTA. ENT consulted for I&D. Patient accompanied by her sons. This has never happened before.    No past medical history on file.    No family history on file.  Social History:  reports that she has been smoking cigarettes. She does not have any smokeless tobacco history on file. She reports that she does not currently use alcohol. She reports that she does not currently use drugs.  Allergies: Not on File  Medications: I have reviewed the patient's current medications.  Results for orders placed or performed during the hospital encounter of 01/18/22 (from the past 48 hour(s))  Resp panel by RT-PCR (RSV, Flu A&B, Covid) Anterior Nasal Swab     Status: None   Collection Time: 01/18/22 10:37 AM   Specimen: Anterior Nasal Swab  Result Value Ref Range   SARS Coronavirus 2 by RT PCR NEGATIVE NEGATIVE    Comment: (NOTE) SARS-CoV-2 target nucleic acids are NOT DETECTED.  The SARS-CoV-2 RNA is generally detectable in upper respiratory specimens during the acute phase of infection. The lowest concentration of SARS-CoV-2 viral copies this assay can detect is 138 copies/mL. A negative result does not preclude SARS-Cov-2 infection and should not be used as the sole basis for treatment or other patient management decisions. A negative result may occur with  improper specimen collection/handling, submission of specimen other than nasopharyngeal swab, presence of viral mutation(s) within the areas targeted by this assay, and inadequate number of viral copies(<138 copies/mL). A negative result must be combined with clinical observations, patient history, and epidemiological information. The expected result is  Negative.  Fact Sheet for Patients:  EntrepreneurPulse.com.au  Fact Sheet for Healthcare Providers:  IncredibleEmployment.be  This test is no t yet approved or cleared by the [place] FDA and  has been authorized for detection and/or diagnosis of SARS-CoV-2 by FDA under an Emergency Use Authorization (EUA). This EUA will remain  in effect (meaning this test can be used) for the duration of the COVID-19 declaration under Section 564(b)(1) of the Act, 21 U.S.C.section 360bbb-3(b)(1), unless the authorization is terminated  or revoked sooner.       Influenza A by PCR NEGATIVE NEGATIVE   Influenza B by PCR NEGATIVE NEGATIVE    Comment: (NOTE) The Xpert Xpress SARS-CoV-2/FLU/RSV plus assay is intended as an aid in the diagnosis of influenza from Nasopharyngeal swab specimens and should not be used as a sole basis for treatment. Nasal washings and aspirates are unacceptable for Xpert Xpress SARS-CoV-2/FLU/RSV testing.  Fact Sheet for Patients: EntrepreneurPulse.com.au  Fact Sheet for Healthcare Providers: IncredibleEmployment.be  This test is not yet approved or cleared by the [place] FDA and has been authorized for detection and/or diagnosis of SARS-CoV-2 by FDA under an Emergency Use Authorization (EUA). This EUA will remain in effect (meaning this test can be used) for the duration of the COVID-19 declaration under Section 564(b)(1) of the Act, 21 U.S.C. section 360bbb-3(b)(1), unless the authorization is terminated or revoked.     Resp Syncytial Virus by PCR NEGATIVE NEGATIVE    Comment: (NOTE) Fact Sheet for Patients: EntrepreneurPulse.com.au  Fact Sheet for Healthcare Providers: IncredibleEmployment.be  This test is not yet approved or cleared by the [place] and  has been authorized for detection and/or diagnosis of SARS-CoV-2 by FDA under an  Emergency Use Authorization (EUA). This EUA will remain in effect (meaning this test can be used) for the duration of the COVID-19 declaration under Section 564(b)(1) of the Act, 21 U.S.C. section 360bbb-3(b)(1), unless the authorization is terminated or revoked.  Performed at [facility], [place] [address] [place], [place] [address]   CBC with Differential     Status: Abnormal   Collection Time: 01/18/22 10:54 AM  Result Value Ref Range   WBC 17.7 (H) 4.0 - 10.5 K/uL   RBC 4.00 3.87 - 5.11 MIL/uL   Hemoglobin 12.6 12.0 - 15.0 g/dL   HCT 40.1 36.0 - 46.0 %   MCV 100.3 (H) 80.0 - 100.0 fL   MCH 31.5 26.0 - 34.0 pg   MCHC 31.4 30.0 - 36.0 g/dL   RDW 13.0 11.5 - 15.5 %   Platelets 233 150 - 400 K/uL   nRBC 0.0 0.0 - 0.2 %   Neutrophils Relative % 83 %   Neutro Abs 14.7 (H) 1.7 - 7.7 K/uL   Lymphocytes Relative 7 %   Lymphs Abs 1.3 0.7 - 4.0 K/uL   Monocytes Relative 9 %   Monocytes Absolute 1.6 (H) 0.1 - 1.0 K/uL   Eosinophils Relative 0 %   Eosinophils Absolute 0.0 0.0 - 0.5 K/uL   Basophils Relative 0 %   Basophils Absolute 0.0 0.0 - 0.1 K/uL   Immature Granulocytes 1 %   Abs Immature Granulocytes 0.08 (H) 0.00 - 0.07 K/uL    Comment: Performed at [facility] [address] [place], [place] [id number]  Basic metabolic panel     Status: Abnormal   Collection Time: 01/18/22 10:54 AM  Result Value Ref Range   Sodium 137 135 - 145 mmol/L   Potassium 4.4 3.5 - 5.1 mmol/L    Comment: HEMOLYSIS AT THIS LEVEL MAY AFFECT RESULT   Chloride 101 98 - 111 mmol/L   CO2 20 (L) 22 - 32 mmol/L   Glucose, Bld 131 (H) 70 - 99 mg/dL    Comment: Glucose reference range applies only to samples taken after fasting for at least 8 hours.   BUN 12 8 - 23 mg/dL   Creatinine, Ser 0.79 0.44 - 1.00 mg/dL   Calcium 9.2 8.9 - 10.3 mg/dL   GFR, Estimated >60 >60 mL/min    Comment: (NOTE) Calculated using the CKD-EPI Creatinine Equation (2021)    Anion gap 16 (H) 5 - 15    Comment:  Performed at [facility] [address], [place], [place] [address]    CT Soft Tissue Neck W Contrast  Result Date: 01/18/2022 CLINICAL DATA:  [place] angina EXAM: CT NECK WITH CONTRAST TECHNIQUE: Multidetector CT imaging of the neck was performed using the standard protocol following the bolus administration of intravenous contrast. RADIATION DOSE REDUCTION: This exam was performed according to the departmental dose-optimization program which includes automated exposure control, adjustment of the mA and/or kV according to patient size and/or use of iterative reconstruction technique. CONTRAST:  57mL OMNIPAQUE IOHEXOL 350 MG/ML SOLN COMPARISON:  None Available. FINDINGS: Pharynx and larynx: 3.2 cm left tonsillar collection with regional submucosal low-density thickening. Patent airway. No retropharyngeal collection. Salivary glands: No inflammation, mass, or stone. Thyroid: Normal. Lymph nodes: Expected thickening of lymph nodes in the bilateral neck Vascular: Negative. Limited intracranial: Remote right MCA branch infarct. Visualized orbits: Negative Mastoids and visualized paranasal sinuses: Clear Skeleton:  Ordinary cervical spine degeneration. Upper chest: Clear apical lungs IMPRESSION: 3.2 cm left peritonsillar abscess with regional pharyngitis. Electronically Signed   By: Tiburcio Pea M.D.   On: 01/18/2022 12:32    [id number] other than stated per HPI  Blood pressure 135/84, pulse (!) 102, temperature 99.8 F (37.7 C), temperature source Oral, resp. rate (!) 32, SpO2 95 %.  PHYSICAL EXAM:  CONSTITUTIONAL: well developed, nourished, no distress and alert and oriented x 3. Voice muffled.  EYES: PERRL, EOMI  HENT: Head : normocephalic and atraumatic Ears: Right ear:   canal normal, external ear normal and hearing normal Left ear:   canal normal, external ear normal and hearing normal Nose: nose normal and no purulence Mouth/Throat:  Mouth: uvula deviated to the left with  significant fullness of the left soft palate. Tonsills 2+ Throat: oropharynx clear and moist NECK: supple, trachea normal and no thyromegaly; mild left cervical LAD, tender NEURO: CN II-XII symmetric intact   Studies Reviewed:CT Neck with contrast 01/18/22 IMPRESSION: 3.2 cm left peritonsillar abscess with regional pharyngitis.     Electronically Signed   By: Tiburcio Pea M.D.   On: 01/18/2022 12:32   PROCEDURE - INCISION AND DRAINAGE OF LEFT PERITONSILLAR ABSCESS  Findings: Peritonsillar abscess and purulence was encountered on the left side.  Details: Informed consent was obtained.  The patient's left peritonsillar region was anesthetized with  1% lidocaine with 1:100,000 epinephrine injected into the left soft palate.  The peritonsillar region was incised with a 11 blade.  A curved hemostat was utilized to open up all loculations.  Copious frank purulence was encountered.  This was evacuated. Next, the peritonsillar space was irrigated copiously with normal saline and the patient's mouth was suctioned.  Hemostasis was achieved spontaneously.  All instrumentation was removed.  The patient tolerated the procedure well with no complications appreciated.    Assessment/Plan: #LEFT PTA s/p I&D  OK to discharge from ED on 10 days of Augmentin / Clindamycin Peridex mouth rinse TID x 7 days ENT Clinic referral ; outpatient discussion of elective tonsillectomy   I have personally spent 35 minutes involved in face-to-face and non-face-to-face activities for this patient on the day of the visit.  Professional time spent includes the following activities, in addition to those noted in the documentation: preparing to see the patient (eg, review of tests), obtaining and/or reviewing separately obtained history, performing a medically appropriate examination and/or evaluation, counseling and educating the patient/family/caregiver, ordering medications, tests or procedures, referring and  communicating with other healthcare professionals, documenting clinical information in the electronic or other health record, independently interpreting results and communicating results with the patient/family/caregiver, care coordination.  Electronically signed by:  Scarlette Ar, MD  Staff Physician Facial Plastic & Reconstructive Surgery Otolaryngology - Head and Neck Surgery [facility]   01/18/2022, 1:47 PM

## 2022-01-18 NOTE — ED Provider Triage Note (Signed)
Emergency Medicine Provider Triage Evaluation Note  Natasha Davis , a 66 y.o. female  was evaluated in triage.  Pt complains of increased swelling in her throat, neck and mouth since Thursday. She reports symptoms got worse yesterday that she was not able to swallow and it's hard for her to breathe. No fever, rash. She reports being seen yesterday diagnosed with URI.  Review of Systems  Positive: As above Negative: As above  Physical Exam  There were no vitals taken for this visit. Gen:   Awake   Resp:  Difficulty breathing MSK:   Moves extremities without difficulty  Other:  Neck and tongue swelling  Medical Decision Making  Medically screening exam initiated at 10:26 AM.  Appropriate orders placed.  Natasha Davis was informed that the remainder of the evaluation will be completed by another provider, this initial triage assessment does not replace that evaluation, and the importance of remaining in the ED until their evaluation is complete.     Jeanelle Malling, [place] 01/19/22 2143

## 2022-01-18 NOTE — ED Provider Notes (Signed)
[facility] EMERGENCY DEPARTMENT Provider Note   CSN: [id number] Arrival date & time: 01/18/22  1012     History Chief Complaint  Patient presents with  . Allergic Reaction  . Oral Swelling    HPI Merriam Brandner is a 66 y.o. female presenting for shortness of breath.  She is a 66 year old female who has had an upper respiratory infection including sore throat and cough for the last 3 days but today lost the ability to tolerate p.o. intake.  She is having cough and intolerance of secretions.  She endorses developing shortness of breath.  Denies fevers or chills, nausea or vomiting, syncope or chest pain.  No known sick contacts. This is a history of hypertension on metoprolol.  Patient's recorded medical, surgical, social, medication list and allergies were reviewed in the Snapshot window as part of the initial history.   Review of Systems   Review of Systems  Constitutional:  Negative for chills and fever.  HENT:  Negative for ear pain and sore throat.   Eyes:  Negative for pain and visual disturbance.  Respiratory:  Positive for cough and shortness of breath.   Cardiovascular:  Negative for chest pain and palpitations.  Gastrointestinal:  Negative for abdominal pain and vomiting.  Genitourinary:  Negative for dysuria and hematuria.  Musculoskeletal:  Negative for arthralgias and back pain.  Skin:  Negative for color change and rash.  Neurological:  Negative for seizures and syncope.  All other systems reviewed and are negative.   Physical Exam Updated Vital Signs There were no vitals taken for this visit. Physical Exam Vitals and nursing note reviewed.  Constitutional:      General: She is not in acute distress.    Appearance: She is well-developed.  HENT:     Head: Normocephalic and atraumatic.  Eyes:     Conjunctiva/sclera: Conjunctivae normal.  Cardiovascular:     Rate and Rhythm: Normal rate and regular rhythm.     Heart sounds: No murmur  heard. Pulmonary:     Effort: Pulmonary effort is normal. No respiratory distress.     Breath sounds: Normal breath sounds.  Abdominal:     General: There is no distension.     Palpations: Abdomen is soft.     Tenderness: There is no abdominal tenderness. There is no right CVA tenderness or left CVA tenderness.  Musculoskeletal:        General: No swelling or tenderness. Normal range of motion.     Cervical back: Neck supple.  Skin:    General: Skin is warm and dry.  Neurological:     General: No focal deficit present.     Mental Status: She is alert and oriented to person, place, and time. Mental status is at baseline.     Cranial Nerves: No cranial nerve deficit.     ED Course/ Medical Decision Making/ A&P    Procedures Procedures   Medications Ordered in ED Medications - No data to display  Medical Decision Making:    Angeliah Wisdom is a 66 y.o. female who presented to the ED today with *** detailed above.     {crccomplexity:27900}  Complete initial physical exam performed, notably the patient  was ***.      Reviewed and confirmed nursing documentation for past medical history, family history, social history.    Initial Assessment:   With the patient's presentation of ***, most likely diagnosis is ***. Other diagnoses were considered including (but not limited to) ***. These are  considered less likely due to history of present illness and physical exam findings.   {crccopa:27899}  Initial Plan:  ***  ***Screening labs including CBC and Metabolic panel to evaluate for infectious or metabolic etiology of disease.  ***Urinalysis with reflex culture ordered to evaluate for UTI or relevant urologic/nephrologic pathology.  ***CXR to evaluate for structural/infectious intrathoracic pathology.  ***EKG to evaluate for cardiac pathology. Objective evaluation as below reviewed with plan for close reassessment  Initial Study Results:   Laboratory  All laboratory results  reviewed without evidence of clinically relevant pathology.   ***Exceptions include: ***   ***EKG EKG was reviewed independently. Rate, rhythm, axis, intervals all examined and without medically relevant abnormality. ST segments without concerns for elevations.    Radiology  All images reviewed independently. ***Agree with radiology report at this time.   No results found.   Consults:  Case discussed with ***.   Final Assessment and Plan:   ***   ***  Clinical Impression: No diagnosis found.   Data Unavailable   Final Clinical Impression(s) / ED Diagnoses Final diagnoses:  None    Rx / DC Orders ED Discharge Orders     None

## 2022-01-19 ENCOUNTER — Encounter: Payer: Self-pay | Admitting: Cardiovascular Disease

## 2022-01-21 ENCOUNTER — Encounter: Payer: Self-pay | Admitting: Cardiovascular Disease

## 2022-01-21 LAB — AEROBIC/ANAEROBIC CULTURE W GRAM STAIN (SURGICAL/DEEP WOUND): Culture: NORMAL

## 2022-01-22 ENCOUNTER — Ambulatory Visit: Payer: Medicare HMO | Attending: Cardiovascular Disease | Admitting: *Deleted

## 2022-01-22 ENCOUNTER — Telehealth (HOSPITAL_BASED_OUTPATIENT_CLINIC_OR_DEPARTMENT_OTHER): Payer: Self-pay | Admitting: *Deleted

## 2022-01-22 DIAGNOSIS — Z952 Presence of prosthetic heart valve: Secondary | ICD-10-CM | POA: Diagnosis not present

## 2022-01-22 DIAGNOSIS — Z5181 Encounter for therapeutic drug level monitoring: Secondary | ICD-10-CM | POA: Diagnosis not present

## 2022-01-22 LAB — POCT INR: POC INR: 4.1

## 2022-01-22 NOTE — Telephone Encounter (Signed)
Post ED Visit - Positive Culture Follow-up  Culture report reviewed by antimicrobial stewardship pharmacist: [facility] []  , Pharm.D. []  Enzo Bi, Pharm.D., BCPS AQ-ID []  , Pharm.D., BCPS []  Celedonio Miyamoto, .D., BCPS []  [place], .D., BCPS, AAHIVP []  [place]ina Pillion, Pharm.D., BCPS, AAHIVP []  [address], PharmD, BCPS []  , PharmD, BCPS []  [place], PharmD, BCPS []  [address], PharmD []  , PharmD, BCPS []  Estella Husk, PharmD  Pharmacy Team []  Lysle Pearl, PharmD []  , PharmD []  Phillips Climes, PharmD []  , Rph []  Agapito Games) , PharmD []  Verlan Friends, PharmD []  , PharmD []  Mervyn Gay, PharmD []  , PharmD []  Vinnie Level, PharmD []  [facility], PharmD []  , PharmD []  Len Childs, PharmD   Positive urine culture Treated with Amoxicillin, organism sensitive to the same and no further patient follow-up is required at this time. , PharmD  Greer Pickerel [place] 01/22/2022, 11:27 AM

## 2022-01-22 NOTE — Patient Instructions (Signed)
Description   HOLD today's dose then continue taking Warfarin 1.5 tablets daily.    Recheck in 2 weeks.  Stay consistent with greens each week (3 times per week)  Call us # [phone]  Coumadin Clinic with any medication changes or concerns.

## 2022-01-27 ENCOUNTER — Telehealth: Payer: Self-pay | Admitting: *Deleted

## 2022-01-27 NOTE — Telephone Encounter (Signed)
     Patient  visit on 01/18/2022  at [facility]  was for abscess   Number of calls [place] [phone] [address] , [place] [address] Email : [email or web address]

## 2022-02-01 ENCOUNTER — Ambulatory Visit: Payer: 59 | Attending: Cardiology | Admitting: *Deleted

## 2022-02-01 DIAGNOSIS — Z952 Presence of prosthetic heart valve: Secondary | ICD-10-CM | POA: Diagnosis not present

## 2022-02-01 DIAGNOSIS — Z5181 Encounter for therapeutic drug level monitoring: Secondary | ICD-10-CM | POA: Diagnosis not present

## 2022-02-01 LAB — POCT INR: INR: 2.5 (ref 2.0–3.0)

## 2022-02-01 NOTE — Patient Instructions (Signed)
Description   Continue taking Warfarin 1.5 tablets daily. Recheck in 4 weeks.  Stay consistent with greens each week (3 times per week)  Call us # [phone]  Coumadin Clinic with any medication changes or concerns.

## 2022-03-05 ENCOUNTER — Ambulatory Visit: Payer: 59 | Attending: Cardiovascular Disease

## 2022-03-05 DIAGNOSIS — Z5181 Encounter for therapeutic drug level monitoring: Secondary | ICD-10-CM | POA: Diagnosis not present

## 2022-03-05 DIAGNOSIS — Z952 Presence of prosthetic heart valve: Secondary | ICD-10-CM | POA: Diagnosis not present

## 2022-03-05 LAB — POCT INR: INR: 2.4 (ref 2.0–3.0)

## 2022-03-05 NOTE — Patient Instructions (Signed)
Description   Take 2 tablets today and then continue taking Warfarin 1.5 tablets daily. Recheck in 4 weeks.  Stay consistent with greens each week (3 times per week)  Call us # [phone]  Coumadin Clinic with any medication changes or concerns.

## 2022-04-02 ENCOUNTER — Ambulatory Visit: Payer: 59 | Attending: Cardiovascular Disease | Admitting: Pharmacist Clinician (PhC)/ Clinical Pharmacy Specialist

## 2022-04-02 DIAGNOSIS — Z952 Presence of prosthetic heart valve: Secondary | ICD-10-CM

## 2022-04-02 DIAGNOSIS — Z5181 Encounter for therapeutic drug level monitoring: Secondary | ICD-10-CM | POA: Diagnosis not present

## 2022-04-02 LAB — POCT INR: INR: 2.7 (ref 2.0–3.0)

## 2022-04-02 NOTE — Patient Instructions (Signed)
Continue taking Warfarin 1.5 tablets daily. Recheck in 4 weeks.  Stay consistent with greens each week (3 times per week)  BP today 146/88 Call us # [phone]  Coumadin Clinic with any medication changes or concerns.

## 2022-04-15 ENCOUNTER — Other Ambulatory Visit: Payer: Self-pay

## 2022-04-15 DIAGNOSIS — I10 Essential (primary) hypertension: Secondary | ICD-10-CM

## 2022-04-15 DIAGNOSIS — I3489 Other nonrheumatic mitral valve disorders: Secondary | ICD-10-CM

## 2022-04-15 MED ORDER — ATORVASTATIN CALCIUM 20 MG PO TABS: ORAL_TABLET | ORAL | 0 refills | Status: DC

## 2022-04-15 NOTE — Telephone Encounter (Signed)
Pt's medication was sent to pt's pharmacy as requested. Confirmation received.  °

## 2022-04-22 ENCOUNTER — Other Ambulatory Visit: Payer: Self-pay

## 2022-04-22 DIAGNOSIS — I10 Essential (primary) hypertension: Secondary | ICD-10-CM

## 2022-04-22 DIAGNOSIS — I3489 Other nonrheumatic mitral valve disorders: Secondary | ICD-10-CM

## 2022-04-22 MED ORDER — WARFARIN SODIUM 5 MG PO TABS: ORAL_TABLET | ORAL | 1 refills | Status: DC

## 2022-04-22 MED ORDER — ATORVASTATIN CALCIUM 20 MG PO TABS: ORAL_TABLET | ORAL | 0 refills | Status: DC

## 2022-04-22 MED ORDER — METOPROLOL TARTRATE 50 MG PO TABS: 50.0000 mg | ORAL_TABLET | Freq: Every morning | ORAL | 0 refills | Status: DC

## 2022-04-22 MED ORDER — TRIAMTERENE-HCTZ 37.5-25 MG PO TABS: 1.0000 | ORAL_TABLET | Freq: Every morning | ORAL | 0 refills | Status: DC

## 2022-04-22 NOTE — Telephone Encounter (Signed)
Prescription refill request received for warfarin Lov: 06/16/21 Fletcher Anon)  Next INR check: 04/30/22 Warfarin tablet strength: 5mg   Appropriate dose. Refill sent.

## 2022-04-30 ENCOUNTER — Ambulatory Visit: Payer: 59 | Attending: Cardiovascular Disease

## 2022-04-30 DIAGNOSIS — Z5181 Encounter for therapeutic drug level monitoring: Secondary | ICD-10-CM | POA: Diagnosis not present

## 2022-04-30 DIAGNOSIS — Z952 Presence of prosthetic heart valve: Secondary | ICD-10-CM | POA: Diagnosis not present

## 2022-04-30 LAB — POCT INR: INR: 4.4 — AB (ref 2.0–3.0)

## 2022-04-30 NOTE — Patient Instructions (Signed)
Description   HOLD today's dose and then continue taking Warfarin 1.5 tablets daily.  Recheck in 3 weeks.  Stay consistent with greens each week (3 times per week)  Call us # [phone]  Coumadin Clinic with any medication changes or concerns.

## 2022-05-13 ENCOUNTER — Other Ambulatory Visit: Payer: Self-pay | Admitting: Cardiovascular Disease

## 2022-05-13 DIAGNOSIS — I10 Essential (primary) hypertension: Secondary | ICD-10-CM

## 2022-05-13 DIAGNOSIS — I3489 Other nonrheumatic mitral valve disorders: Secondary | ICD-10-CM

## 2022-05-21 ENCOUNTER — Ambulatory Visit: Payer: 59 | Attending: Cardiovascular Disease | Admitting: *Deleted

## 2022-05-21 DIAGNOSIS — Z952 Presence of prosthetic heart valve: Secondary | ICD-10-CM

## 2022-05-21 DIAGNOSIS — Z5181 Encounter for therapeutic drug level monitoring: Secondary | ICD-10-CM | POA: Diagnosis not present

## 2022-05-21 LAB — POCT INR: INR: 3.4 — AB (ref 2.0–3.0)

## 2022-05-21 NOTE — Patient Instructions (Signed)
Description   Continue taking Warfarin 1.5 tablets daily. Recheck in 4 weeks.  Stay consistent with greens each week (3 times per week)  Call us # [phone] or [phone]  Coumadin Clinic with any medication changes or concerns.

## 2022-06-08 ENCOUNTER — Other Ambulatory Visit: Payer: Self-pay | Admitting: Cardiovascular Disease

## 2022-06-18 ENCOUNTER — Ambulatory Visit: Payer: 59 | Attending: Cardiovascular Disease | Admitting: *Deleted

## 2022-06-18 DIAGNOSIS — Z952 Presence of prosthetic heart valve: Secondary | ICD-10-CM

## 2022-06-18 DIAGNOSIS — Z5181 Encounter for therapeutic drug level monitoring: Secondary | ICD-10-CM

## 2022-06-18 LAB — POCT INR: INR: 2.7 (ref 2.0–3.0)

## 2022-06-18 NOTE — Patient Instructions (Signed)
Description   Continue taking Warfarin 1.5 tablets daily. Recheck in 5 weeks.  Stay consistent with greens each week (3 times per week)  Call us # [phone] or [phone]  Coumadin Clinic with any medication changes or concerns.

## 2022-07-06 ENCOUNTER — Other Ambulatory Visit: Payer: Self-pay | Admitting: Cardiovascular Disease

## 2022-07-17 ENCOUNTER — Other Ambulatory Visit: Payer: Self-pay | Admitting: Cardiovascular Disease

## 2022-07-17 DIAGNOSIS — I10 Essential (primary) hypertension: Secondary | ICD-10-CM

## 2022-07-17 DIAGNOSIS — I3489 Other nonrheumatic mitral valve disorders: Secondary | ICD-10-CM

## 2022-07-23 ENCOUNTER — Ambulatory Visit: Payer: 59 | Attending: Cardiovascular Disease

## 2022-07-23 DIAGNOSIS — Z952 Presence of prosthetic heart valve: Secondary | ICD-10-CM | POA: Diagnosis not present

## 2022-07-23 DIAGNOSIS — Z5181 Encounter for therapeutic drug level monitoring: Secondary | ICD-10-CM | POA: Diagnosis not present

## 2022-07-23 LAB — POCT INR: INR: 2.9 (ref 2.0–3.0)

## 2022-07-23 NOTE — Patient Instructions (Signed)
Continue taking Warfarin 1.5 tablets daily. Recheck in 6 weeks.  Stay consistent with greens each week (3 times per week)  Call us # [phone] or [phone]  Coumadin Clinic with any medication changes or concerns.

## 2022-08-10 ENCOUNTER — Other Ambulatory Visit: Payer: Self-pay | Admitting: Cardiovascular Disease

## 2022-08-10 DIAGNOSIS — I3489 Other nonrheumatic mitral valve disorders: Secondary | ICD-10-CM

## 2022-08-10 DIAGNOSIS — I10 Essential (primary) hypertension: Secondary | ICD-10-CM

## 2022-08-15 ENCOUNTER — Encounter: Payer: Self-pay | Admitting: Cardiovascular Disease

## 2022-08-15 NOTE — Progress Notes (Unsigned)
Natasha Davis Date of Birth  17-Jun-1955   [address]    [address]    [place], [place]  [address]       Natasha Davis is a 67 year old female.  1.  Mitral stenosis 2.  Status post mitral valve  placement 3 . Chronic anticoagulation 4.  Hypertension 5.  History of CVA ( prior to being diagnoses with mitral stenosis) 6.  History of claudication 7.  Cigarette smoking  Natasha Davis has done well since I last saw her in December, 2011.  She's not having episodes of chest pain or shortness breath. She's been on Coumadin and her protime levels have been therapeutic.  She quit smoking but started back again.  May 04, 2012  Corey is doing well from a cardiac standpoint.   She is still smoking.   Jun 20, 2015:  Natasha Davis is seen for a follow up visit  - missed last years office visit.  Has been keeping her INR appts.  No cardiac issues.  BP and HR are good,  Still smoking   March 26, 2016: Natasha Davis is seen today for a follow up visit Has started back smoking   Aug. 29, 2019  Doing ok INR was 1.9 today - have been eating more salad  No CP or dyspnea Has mechanical MVR in 1998.   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  Still smokes   October 27 2018:   Natasha Davis is seen today for a follow-up visit.  She has a history of mitral valve replacement for mitral stenosis.  ECG now shows previous Ant. MI   ( not there on last years ECG  She denies any cp  Still smoking   Feb. 1, 2022:  Natasha Davis is seen today for follow up of her MS and MV replacement  Still smoking ,  Advised cessation .  INR is theraputic  INR levels are therapeutic.  Her last INR was 3.7.  May 11, 2021: Natasha Davis is seen today for follow-up of her mitral stenosis and mitral valve replacement. Still smoking  Having issues with claudication - esp in her left leg Advised her to stop smoking  Will get lower extremity arterial duplex.    August 16, 2022 Natasha Davis is seen today for follow of her mitral stenosis,  s/p MVR   Current Outpatient Medications on File Prior to Visit  Medication Sig Dispense Refill   acetaminophen (TYLENOL) 500 MG tablet Take 1,000 mg by mouth every 6 (six) hours as needed for moderate pain, mild pain, headache or fever.     acetaminophen (TYLENOL) 650 MG CR tablet Take 650 mg by mouth every 8 (eight) hours as needed. Pain     amoxicillin-clavulanate (AUGMENTIN) 875-125 MG tablet Take 1 tablet by mouth every 12 (twelve) hours. 14 tablet 0   atorvastatin (LIPITOR) 20 MG tablet TAKE 1 TABLET BY MOUTH EVERY DAY 30 tablet 0   chlorhexidine (PERIDEX) 0.12 % solution Use as directed 15 mLs in the mouth or throat 2 (two) times daily. 120 mL 0   Homeopathic Products (EARACHE DROPS OT) Place 1 drop into both ears 2 (two) times daily as needed (pain).     levETIRAcetam (KEPPRA) 500 MG tablet Take 500 mg by mouth in the morning.     metoprolol tartrate (LOPRESSOR) 50 MG tablet TAKE 1 TABLET BY MOUTH IN THE  MORNING 30 tablet 1   Multiple Vitamins-Minerals (CENTRUM SILVER ADULT 50+) TABS Take 1 tablet by mouth in  the morning.     omeprazole (PRILOSEC) 20 MG capsule Take 40 mg by mouth daily.     sodium chloride (OCEAN) 0.65 % SOLN nasal spray Place 1 spray into both nostrils 2 (two) times daily as needed for congestion.     triamterene-hydrochlorothiazide (MAXZIDE-25) 37.5-25 MG tablet TAKE 1 TABLET BY MOUTH IN THE  MORNING 30 tablet 1   warfarin (COUMADIN) 5 MG tablet TAKE 1 AND 1/2 TO 2 TABLETS ONCE DAILY AS DIRECTED BY COUMADIN CLINIC 145 tablet 1   warfarin (COUMADIN) 5 MG tablet TAKE 1 1/2 TABLETS BY MOUTH DAILY OR AS DIRECTED BY COUMADIN CLINIC 140 tablet 1   No current facility-administered medications on file prior to visit.    Allergies  Allergen Reactions   Penicillins    Penicillins Other (See Comments)    "Couldn't walk"    Past Medical History:  Diagnosis Date   Chronic anticoagulation    Claudication (HCC)    History of CVA (cerebrovascular accident)     Hypertension    Mitral stenosis    Seizure (HCC)    HX    Past Surgical History:  Procedure Laterality Date   CARDIAC CATHETERIZATION  06/01/96   NORMAL LEFT VENTRICULAR SYSTOLIC FUNCTION. EF 60%   CESAREAN SECTION     X2   MITRAL VALVE REPLACEMENT      Social History   Tobacco Use  Smoking Status Every Day   Current packs/day: 0.00   Types: Cigarettes   Last attempt to quit: 01/25/2006   Years since quitting: 16.5  Smokeless Tobacco Never    Social History   Substance and Sexual Activity  Alcohol Use Not Currently    Family History  Problem Relation Age of Onset   Hypertension Mother    Heart disease Father    Hypertension Father    Heart attack Father    Stroke Brother     Reviw of Systems:  Reviewed in the HPI.  All other systems are negative.    Physical Exam: There were no vitals taken for this visit.  No BP recorded.  {Refresh Note OR Click here to enter BP  :1}***    GEN:  Well nourished, well developed in no acute distress HEENT: Normal NECK: No JVD; No carotid bruits LYMPHATICS: No lymphadenopathy CARDIAC: RRR ***, no murmurs, rubs, gallops RESPIRATORY:  Clear to auscultation without rales, wheezing or rhonchi  ABDOMEN: Soft, non-tender, non-distended MUSCULOSKELETAL:  No edema; No deformity  SKIN: Warm and dry NEUROLOGIC:  Alert and oriented x 3    ECG:    Assessment / Plan:   1.  Mitral stenosis - s/p MVR ,       3 . Chronic anticoagulation - cont coumadin    4.  Hypertension -    5.  History of CVA -    6.  History of claudication -   7.  Cigarette smoking -   I have advised smoking cessation multiple times    Kristeen Miss, MD  08/15/2022 8:42 AM    [facility] [address] [place],  [address] [place], [place]  [address] Pager [phone] Phone: [phone]; Fax: [phone]

## 2022-08-16 ENCOUNTER — Ambulatory Visit: Payer: 59 | Attending: Cardiovascular Disease | Admitting: Cardiovascular Disease

## 2022-08-16 ENCOUNTER — Encounter: Payer: Self-pay | Admitting: Cardiovascular Disease

## 2022-08-16 ENCOUNTER — Other Ambulatory Visit: Payer: Self-pay | Admitting: Cardiovascular Disease

## 2022-08-16 VITALS — BP 122/75 | HR 88 | Ht 59.0 in | Wt 202.6 lb

## 2022-08-16 DIAGNOSIS — Z5181 Encounter for therapeutic drug level monitoring: Secondary | ICD-10-CM

## 2022-08-16 DIAGNOSIS — I739 Peripheral vascular disease, unspecified: Secondary | ICD-10-CM | POA: Diagnosis not present

## 2022-08-16 DIAGNOSIS — Z952 Presence of prosthetic heart valve: Secondary | ICD-10-CM

## 2022-08-16 DIAGNOSIS — K219 Gastro-esophageal reflux disease without esophagitis: Secondary | ICD-10-CM | POA: Diagnosis not present

## 2022-08-16 MED ORDER — OMEPRAZOLE 20 MG PO CPDR: 40.0000 mg | DELAYED_RELEASE_CAPSULE | Freq: Every day | ORAL | 0 refills | Status: DC

## 2022-08-16 NOTE — Patient Instructions (Signed)
Medication Instructions:  Your physician recommends that you continue on your current medications as directed. Please refer to the Current Medication list given to you today.  *If you need a refill on your cardiac medications before your next appointment, please call your pharmacy*  Lab Work: None ordered today.  Testing/Procedures: None ordered today.  Follow-Up: At [facility], you and your health needs are our priority.  As part of our continuing mission to provide you with exceptional heart care, we have created designated Provider Care Teams.  These Care Teams include your primary Cardiologist (physician) and Advanced Practice Providers (APPs -  Physician Assistants and Nurse Practitioners) who all work together to provide you with the care you need, when you need it.  Your next appointment:   1 year(s)  The format for your next appointment:   In Person  Provider:   Philip Nahser, MD { 

## 2022-09-03 ENCOUNTER — Ambulatory Visit: Payer: 59

## 2022-09-09 ENCOUNTER — Ambulatory Visit: Payer: 59 | Attending: Cardiology | Admitting: *Deleted

## 2022-09-09 DIAGNOSIS — Z952 Presence of prosthetic heart valve: Secondary | ICD-10-CM

## 2022-09-09 DIAGNOSIS — Z5181 Encounter for therapeutic drug level monitoring: Secondary | ICD-10-CM

## 2022-09-09 LAB — POCT INR: INR: 4.6 — AB (ref 2.0–3.0)

## 2022-09-09 NOTE — Patient Instructions (Signed)
Description   Do not take any warfarin today then continue taking Warfarin 1.5 tablets daily. Recheck in 4 weeks (normally 6 weeks). Stay consistent with greens each week (3 times per week)  Call us # [phone] or [phone]  Coumadin Clinic with any medication changes or concerns.

## 2022-09-10 ENCOUNTER — Other Ambulatory Visit: Payer: Self-pay | Admitting: Cardiovascular Disease

## 2022-09-13 NOTE — Telephone Encounter (Signed)
Refill request

## 2022-10-07 ENCOUNTER — Ambulatory Visit: Payer: 59 | Attending: Internal Medicine | Admitting: *Deleted

## 2022-10-07 DIAGNOSIS — Z5181 Encounter for therapeutic drug level monitoring: Secondary | ICD-10-CM

## 2022-10-07 DIAGNOSIS — Z952 Presence of prosthetic heart valve: Secondary | ICD-10-CM | POA: Diagnosis not present

## 2022-10-07 LAB — POCT INR: INR: 4.4 — AB (ref 2.0–3.0)

## 2022-10-07 NOTE — Patient Instructions (Signed)
Description   Do not take any warfarin today then START taking Warfarin 1.5 tablets daily except 1 tablet on Mondays. Recheck in 4 weeks (normally 6 weeks). Stay consistent with greens each week (3 times per week)  Call us # [phone] or [phone]  Coumadin Clinic with any medication changes or concerns.

## 2022-11-02 ENCOUNTER — Other Ambulatory Visit: Payer: Self-pay | Admitting: Cardiovascular Disease

## 2022-11-02 DIAGNOSIS — I3489 Other nonrheumatic mitral valve disorders: Secondary | ICD-10-CM

## 2022-11-02 DIAGNOSIS — I10 Essential (primary) hypertension: Secondary | ICD-10-CM

## 2022-11-04 ENCOUNTER — Ambulatory Visit: Payer: 59 | Attending: Cardiovascular Disease | Admitting: *Deleted

## 2022-11-04 DIAGNOSIS — Z5181 Encounter for therapeutic drug level monitoring: Secondary | ICD-10-CM

## 2022-11-04 DIAGNOSIS — Z952 Presence of prosthetic heart valve: Secondary | ICD-10-CM | POA: Diagnosis not present

## 2022-11-04 LAB — POCT INR: INR: 5.5 — AB (ref 2.0–3.0)

## 2022-11-04 NOTE — Patient Instructions (Addendum)
High=Thin (not enough greens) Low=Thick (too many greens)  Description   Do not take any warfarin today and no warfarin tomorrow then START taking Warfarin 1.5 tablets daily except 1 tablet on Mondays and Fridays. Recheck in 3 weeks (normally 6 weeks). Stay consistent with greens each week (3 times per week)  Call us # [phone] or [phone]  Coumadin Clinic with any medication changes or concerns.

## 2022-11-11 ENCOUNTER — Other Ambulatory Visit: Payer: Self-pay | Admitting: Cardiovascular Disease

## 2022-11-25 ENCOUNTER — Ambulatory Visit: Payer: 59 | Attending: Cardiology | Admitting: *Deleted

## 2022-11-25 DIAGNOSIS — Z5181 Encounter for therapeutic drug level monitoring: Secondary | ICD-10-CM | POA: Diagnosis not present

## 2022-11-25 DIAGNOSIS — Z952 Presence of prosthetic heart valve: Secondary | ICD-10-CM

## 2022-11-25 LAB — POCT INR: INR: 4.4 — AB (ref 2.0–3.0)

## 2022-11-25 NOTE — Patient Instructions (Signed)
Description   Do not take any warfarin today then START taking Warfarin 1.5 tablets daily except 1 tablet on Mondays, Wednesdays, and Fridays. Recheck in 3 weeks (normally 6 weeks). Stay consistent with greens each week (3 times per week)  Call us # [phone] or [phone]  Coumadin Clinic with any medication changes or concerns.

## 2022-12-10 ENCOUNTER — Ambulatory Visit: Payer: 59 | Admitting: Cardiovascular Disease

## 2022-12-16 ENCOUNTER — Telehealth: Payer: Self-pay | Admitting: Cardiovascular Disease

## 2022-12-16 ENCOUNTER — Ambulatory Visit: Payer: 59 | Attending: Internal Medicine

## 2022-12-16 DIAGNOSIS — Z952 Presence of prosthetic heart valve: Secondary | ICD-10-CM | POA: Diagnosis not present

## 2022-12-16 DIAGNOSIS — Z5181 Encounter for therapeutic drug level monitoring: Secondary | ICD-10-CM | POA: Diagnosis not present

## 2022-12-16 LAB — POCT INR: INR: 3.3 — AB (ref 2.0–3.0)

## 2022-12-16 NOTE — Telephone Encounter (Signed)
Spoke with patient and she states she has been experiencing swelling in her legs and ankles and SOB with exertion. She states her swelling and SOB is getting worse. Sometimes she feels swollen all over. She states she will not go to the ED. Appointment scheduled. ED precautions discussed.

## 2022-12-16 NOTE — Telephone Encounter (Signed)
Pt c/o Shortness Of Breath: STAT if SOB developed within the last 24 hours or pt is noticeably SOB on the phone  1. Are you currently SOB (can you hear that pt is SOB on the phone)? A little  2. How long have you been experiencing SOB? Over 2 months  3. Are you SOB when sitting or when up moving around?  When she moves around it is worse  4.  Are you currently experiencing any other symptoms? Swelling feet,ankles and it seem like it is all over her body - she wanted to be seen- first available appointment that worked for her was 01-24-23- please call to evaluate

## 2022-12-16 NOTE — Patient Instructions (Signed)
Description   Continue taking Warfarin 1.5 tablets daily except 1 tablet on Mondays, Wednesdays, and Fridays.  Recheck in 4 weeks (normally 6 weeks).  Stay consistent with greens each week (3 times per week)  Call us # [phone] or [phone]  Coumadin Clinic with any medication changes or concerns.

## 2022-12-17 ENCOUNTER — Encounter: Payer: Self-pay | Admitting: Cardiovascular Disease

## 2022-12-17 ENCOUNTER — Ambulatory Visit: Payer: 59 | Attending: Cardiovascular Disease | Admitting: Cardiovascular Disease

## 2022-12-17 VITALS — BP 140/85 | HR 68 | Ht 59.0 in | Wt 212.0 lb

## 2022-12-17 DIAGNOSIS — Z952 Presence of prosthetic heart valve: Secondary | ICD-10-CM

## 2022-12-17 DIAGNOSIS — I10 Essential (primary) hypertension: Secondary | ICD-10-CM

## 2022-12-17 DIAGNOSIS — Z79899 Other long term (current) drug therapy: Secondary | ICD-10-CM

## 2022-12-17 MED ORDER — POTASSIUM CHLORIDE ER 10 MEQ PO TBCR: 10.0000 meq | EXTENDED_RELEASE_TABLET | Freq: Two times a day (BID) | ORAL | 3 refills | Status: DC

## 2022-12-17 MED ORDER — FUROSEMIDE 40 MG PO TABS: 40.0000 mg | ORAL_TABLET | Freq: Every day | ORAL | 3 refills | Status: DC

## 2022-12-17 NOTE — Progress Notes (Signed)
Natasha Davis Date of Birth  1955-09-24   [address]    [address]    [place], [place]  [address]        1.  Mitral stenosis 2.  Status post mitral valve  placement 3 . Chronic anticoagulation 4.  Hypertension 5.  History of CVA ( prior to being diagnoses with mitral stenosis) 6.  History of claudication 7.  Cigarette smoking  Natasha Davis has done well since I last saw her in December, 2011.  She's not having episodes of chest pain or shortness breath. She's been on Coumadin and her protime levels have been therapeutic.  She quit smoking but started back again.  May 04, 2012  Natasha Davis is doing well from a cardiac standpoint.   She is still smoking.   Jun 20, 2015:  Natasha Davis is seen for a follow up visit  - missed last years office visit.  Has been keeping her INR appts.  No cardiac issues.  BP and HR are good,  Still smoking   March 26, 2016: Natasha Davis is seen today for a follow up visit Has started back smoking   Aug. 29, 2019  Doing ok INR was 1.9 today - have been eating more salad  No CP or dyspnea Has mechanical MVR in 1998.   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  Still smokes   October 27 2018:   Natasha Davis is seen today for a follow-up visit.  She has a history of mitral valve replacement for mitral stenosis.  ECG now shows previous Ant. MI   ( not there on last years ECG  She denies any cp  Still smoking   Feb. 1, 2022:  Natasha Davis is seen today for follow up of her MS and MV replacement  Still smoking ,  Advised cessation .  INR is theraputic  INR levels are therapeutic.  Her last INR was 3.7.  May 11, 2021: Natasha Davis is seen today for follow-up of her mitral stenosis and mitral valve replacement. Still smoking  Having issues with claudication - esp in her left leg Advised her to stop smoking  Will get lower extremity arterial duplex.    August 16, 2022 Natasha Davis is seen today for follow of her mitral stenosis, s/p MVR , atrial fib  Still  eating some salt on a regular basis  .  Still smoking   Nov. 22, 2024 Has been having more shortness of breath , lots of edema leg , face  Stopped smoking in September   Has not seen her primary MD for this   Still eating salty foods.  Natasha Davis - twice a week Ham - several times a week Eats take out several times a month      Echocardiogram from October, 2020 LVEF is mildly depressed at 40 to 45%. Prosthetic mitral valve with no evidence of mitral stenosis Trivial tricuspid regurgitation   Current Outpatient Medications on File Prior to Visit  Medication Sig Dispense Refill   acetaminophen (TYLENOL) 500 MG tablet Take 1,000 mg by mouth every 6 (six) hours as needed for moderate pain, mild pain, headache or fever.     acetaminophen (TYLENOL) 650 MG CR tablet Take 650 mg by mouth every 8 (eight) hours as needed. Pain     atorvastatin (LIPITOR) 20 MG tablet TAKE 1 TABLET BY MOUTH EVERY DAY 90 tablet 2   chlorhexidine (PERIDEX) 0.12 % solution Use as directed 15 mLs in the mouth or throat  2 (two) times daily. 120 mL 0   Homeopathic Products (EARACHE DROPS OT) Place 1 drop into both ears 2 (two) times daily as needed (pain).     levETIRAcetam (KEPPRA) 500 MG tablet Take 500 mg by mouth in the morning.     metoprolol tartrate (LOPRESSOR) 50 MG tablet TAKE 1 TABLET BY MOUTH IN THE  MORNING 60 tablet 5   Multiple Vitamins-Minerals (CENTRUM SILVER ADULT 50+) TABS Take 1 tablet by mouth in the morning.     omeprazole (PRILOSEC) 20 MG capsule TAKE 2 CAPSULES BY MOUTH EVERY DAY 180 capsule 2   sodium chloride (OCEAN) 0.65 % SOLN nasal spray Place 1 spray into both nostrils 2 (two) times daily as needed for congestion.     warfarin (COUMADIN) 5 MG tablet TAKE 1 AND 1/2 TO 2 TABLETS ONCE DAILY AS DIRECTED BY COUMADIN CLINIC 145 tablet 1   warfarin (COUMADIN) 5 MG tablet TAKE 1 AND 1/2 TABLETS BY MOUTH  DAILY OR AS DIRECTED BY COUMADIN CLINIC 130 tablet 3   No current facility-administered  medications on file prior to visit.    Allergies  Allergen Reactions   Penicillins    Penicillins Other (See Comments)    "Couldn't walk"    Past Medical History:  Diagnosis Date   Chronic anticoagulation    Claudication (HCC)    History of CVA (cerebrovascular accident)    Hypertension    Mitral stenosis    Seizure (HCC)    HX    Past Surgical History:  Procedure Laterality Date   CARDIAC CATHETERIZATION  06/01/96   NORMAL LEFT VENTRICULAR SYSTOLIC FUNCTION. EF 60%   CESAREAN SECTION     X2   MITRAL VALVE REPLACEMENT      Social History   Tobacco Use  Smoking Status Every Day   Current packs/day: 0.00   Types: Cigarettes   Last attempt to quit: 01/25/2006   Years since quitting: 16.9  Smokeless Tobacco Never    Social History   Substance and Sexual Activity  Alcohol Use Not Currently    Family History  Problem Relation Age of Onset   Hypertension Mother    Heart disease Father    Hypertension Father    Heart attack Father    Stroke Brother     Reviw of Systems:  Reviewed in the HPI.  All other systems are negative.     Physical Exam: Blood pressure (!) 140/85, pulse 68, height 4\' 11"  (1.499 m), weight 212 lb (96.2 kg), SpO2 98%.  HYPERTENSION CONTROL Vitals:   12/17/22 1635 12/17/22 1705  BP: (!) 164/84 (!) 140/85    The patient's blood pressure is elevated above target today.  In order to address the patient's elevated BP: A new medication was prescribed today.       GEN:  moderately obese female  in no acute distress HEENT: Normal NECK: No JVD; No carotid bruits LYMPHATICS: No lymphadenopathy CARDIAC: irreg. Irreg.  , mechanical S1  RESPIRATORY:  Clear to auscultation without rales, wheezing or rhonchi  ABDOMEN: Soft, non-tender, non-distended MUSCULOSKELETAL:  No edema; No deformity  SKIN: Warm and dry NEUROLOGIC:  Alert and oriented x 3      ECG:       Assessment / Plan:   1.  Mitral stenosis - s/p MVR ,   Her  prosthetic mechanical mitral valve sounds crisp.  Continue Coumadin.    2.  Acute on chronic combined systolic and diastolic congestive heart failure: Natasha Davis has a history of  mild CHF.  She admits to eating too much salt.  She is on Maxide.  Will discontinue the Maxide and start her on Lasix 40 mg a day and potassium chloride 10 mill equivalents twice a day.  Will check a basic metabolic profile in 2 to 3 weeks.  She will return to see an APP or me in 3 months.    3 . Atrial fib: Has chronic atrial fibrillation.  Continue warfarin.    4.  Hypertension -    Blood pressure is elevated.  We are changing her Maxide to Lasix and potassium.  Have advised her to work on salt restriction.  5.  History of CVA -  in the setting of mitral stenosis, atrial fib   6.  History of claudication -   7.  Cigarette smoking -   she quit smoking several months ago .   She may have COPD .   She may benefit from seeing a pulmonologist at some point          Natasha Miss, MD  12/17/2022 5:10 PM    [facility] [address] [place],  [address] [place], [place]  [address] Pager [phone] Phone: [phone]; Fax: [phone]

## 2022-12-17 NOTE — Patient Instructions (Signed)
Medication Instructions:  STOP Triamterene/hydrochlorothiazide START Furosemide/Lasix 40mg  daily START Potassium Chloride twice daily *If you need a refill on your cardiac medications before your next appointment, please call your pharmacy*  Lab Work: BMET in 2-3 weeks If you have labs (blood work) drawn today and your tests are completely normal, you will receive your results only by: MyChart Message (if you have MyChart) OR A paper copy in the mail If you have any lab test that is abnormal or we need to change your treatment, we will call you to review the results.  Testing/Procedures: ECHO Your physician has requested that you have an echocardiogram. Echocardiography is a painless test that uses sound waves to create images of your heart. It provides your doctor with information about the size and shape of your heart and how well your heart's chambers and valves are working. This procedure takes approximately one hour. There are no restrictions for this procedure. Please do NOT wear cologne, perfume, aftershave, or lotions (deodorant is allowed). Please arrive 15 minutes prior to your appointment time.  Please note: We ask at that you not bring children with you during ultrasound (echo/ vascular) testing. Due to room size and safety concerns, children are not allowed in the ultrasound rooms during exams. Our front office staff cannot provide observation of children in our lobby area while testing is being conducted. An adult accompanying a patient to their appointment will only be allowed in the ultrasound room at the discretion of the ultrasound technician under special circumstances. We apologize for any inconvenience.  Follow-Up: At [facility], you and your health needs are our priority.  As part of our continuing mission to provide you with exceptional heart care, we have created designated Provider Care Teams.  These Care Teams include your primary Cardiologist  (physician) and Advanced Practice Providers (APPs -  Physician Assistants and Nurse Practitioners) who all work together to provide you with the care you need, when you need it.  Your next appointment:   3 month(s)  Provider:   Kristeen Miss, MD

## 2022-12-30 DIAGNOSIS — E782 Mixed hyperlipidemia: Secondary | ICD-10-CM | POA: Diagnosis not present

## 2022-12-30 DIAGNOSIS — R7309 Other abnormal glucose: Secondary | ICD-10-CM | POA: Diagnosis not present

## 2022-12-30 DIAGNOSIS — E119 Type 2 diabetes mellitus without complications: Secondary | ICD-10-CM | POA: Diagnosis not present

## 2022-12-30 DIAGNOSIS — I10 Essential (primary) hypertension: Secondary | ICD-10-CM | POA: Diagnosis not present

## 2022-12-30 DIAGNOSIS — I739 Peripheral vascular disease, unspecified: Secondary | ICD-10-CM | POA: Diagnosis not present

## 2023-01-02 ENCOUNTER — Emergency Department (HOSPITAL_BASED_OUTPATIENT_CLINIC_OR_DEPARTMENT_OTHER): Payer: 59 | Admitting: Radiology

## 2023-01-02 ENCOUNTER — Other Ambulatory Visit: Payer: Self-pay

## 2023-01-02 ENCOUNTER — Emergency Department (HOSPITAL_BASED_OUTPATIENT_CLINIC_OR_DEPARTMENT_OTHER)
Admission: EM | Admit: 2023-01-02 | Discharge: 2023-01-02 | Disposition: A | Discharge status: Home / Self Care | Payer: 59 | Attending: Emergency Medicine | Admitting: Emergency Medicine

## 2023-01-02 DIAGNOSIS — Z79899 Other long term (current) drug therapy: Secondary | ICD-10-CM | POA: Insufficient documentation

## 2023-01-02 DIAGNOSIS — R0602 Shortness of breath: Secondary | ICD-10-CM | POA: Diagnosis not present

## 2023-01-02 DIAGNOSIS — R946 Abnormal results of thyroid function studies: Secondary | ICD-10-CM | POA: Insufficient documentation

## 2023-01-02 DIAGNOSIS — I4891 Unspecified atrial fibrillation: Secondary | ICD-10-CM | POA: Insufficient documentation

## 2023-01-02 DIAGNOSIS — R609 Edema, unspecified: Secondary | ICD-10-CM

## 2023-01-02 DIAGNOSIS — R600 Localized edema: Secondary | ICD-10-CM | POA: Diagnosis not present

## 2023-01-02 DIAGNOSIS — Z7901 Long term (current) use of anticoagulants: Secondary | ICD-10-CM | POA: Insufficient documentation

## 2023-01-02 LAB — BRAIN NATRIURETIC PEPTIDE: B Natriuretic Peptide: 207.9 pg/mL — ABNORMAL HIGH (ref 0.0–100.0)

## 2023-01-02 LAB — COMPREHENSIVE METABOLIC PANEL
ALT: 25 U/L (ref 0–44)
AST: 41 U/L (ref 15–41)
Albumin: 4.2 g/dL (ref 3.5–5.0)
Alkaline Phosphatase: 97 U/L (ref 38–126)
Anion gap: 8 (ref 5–15)
BUN: 14 mg/dL (ref 8–23)
CO2: 32 mmol/L (ref 22–32)
Calcium: 8.9 mg/dL (ref 8.9–10.3)
Chloride: 101 mmol/L (ref 98–111)
Creatinine, Ser: 0.92 mg/dL (ref 0.44–1.00)
GFR, Estimated: >60 mL/min (ref >60)
Glucose, Bld: 118 mg/dL — ABNORMAL HIGH (ref 70–99)
Potassium: 3.8 mmol/L (ref 3.5–5.1)
Sodium: 141 mmol/L (ref 135–145)
Total Bilirubin: 0.7 mg/dL (ref <1.2)
Total Protein: 7.3 g/dL (ref 6.5–8.1)

## 2023-01-02 LAB — CBC WITH DIFFERENTIAL/PLATELET
Abs Immature Granulocytes: 0.02 10*3/uL (ref 0.00–0.07)
Basophils Absolute: 0 10*3/uL (ref 0.0–0.1)
Basophils Relative: 0 %
Eosinophils Absolute: 0.1 10*3/uL (ref 0.0–0.5)
Eosinophils Relative: 1 %
HCT: 35.2 % — ABNORMAL LOW (ref 36.0–46.0)
Hemoglobin: 11.1 g/dL — ABNORMAL LOW (ref 12.0–15.0)
Immature Granulocytes: 0 %
Lymphocytes Relative: 14 %
Lymphs Abs: 1 10*3/uL (ref 0.7–4.0)
MCH: 30.3 pg (ref 26.0–34.0)
MCHC: 31.5 g/dL (ref 30.0–36.0)
MCV: 96.2 fL (ref 80.0–100.0)
Monocytes Absolute: 0.7 10*3/uL (ref 0.1–1.0)
Monocytes Relative: 10 %
Neutro Abs: 5.2 10*3/uL (ref 1.7–7.7)
Neutrophils Relative %: 75 %
Platelets: 228 10*3/uL (ref 150–400)
RBC: 3.66 MIL/uL — ABNORMAL LOW (ref 3.87–5.11)
RDW: 13.5 % (ref 11.5–15.5)
WBC: 6.9 10*3/uL (ref 4.0–10.5)
nRBC: 0 % (ref 0.0–0.2)

## 2023-01-02 LAB — URINALYSIS, ROUTINE W REFLEX MICROSCOPIC
Bacteria, UA: NONE SEEN
Bilirubin Urine: NEGATIVE
Glucose, UA: NEGATIVE mg/dL
Hgb urine dipstick: NEGATIVE
Ketones, ur: NEGATIVE mg/dL
Nitrite: NEGATIVE
Protein, ur: 30 mg/dL — AB
Specific Gravity, Urine: 1.009 (ref 1.005–1.030)
pH: 6.5 (ref 5.0–8.0)

## 2023-01-02 LAB — T4, FREE: Free T4: 1.14 ng/dL — ABNORMAL HIGH (ref 0.61–1.12)

## 2023-01-02 LAB — PROTIME-INR
INR: 3.3 — ABNORMAL HIGH (ref 0.8–1.2)
Prothrombin Time: 34.1 s — ABNORMAL HIGH (ref 11.4–15.2)

## 2023-01-02 LAB — TSH: TSH: 4.962 u[IU]/mL — ABNORMAL HIGH (ref 0.350–4.500)

## 2023-01-02 LAB — TROPONIN I (HIGH SENSITIVITY)
Troponin I (High Sensitivity): <2 ng/L (ref <18)
Troponin I (High Sensitivity): <2 ng/L (ref <18)

## 2023-01-02 MED ORDER — ALBUTEROL SULFATE HFA 108 (90 BASE) MCG/ACT IN AERS: 2.0000 | INHALATION_SPRAY | RESPIRATORY_TRACT | Status: DC | PRN

## 2023-01-02 MED ORDER — FUROSEMIDE 10 MG/ML IJ SOLN
40.0000 mg | Freq: Once | INTRAMUSCULAR | Status: AC
  Administered 2023-01-02: 40 mg via INTRAVENOUS
  Filled 2023-01-02: qty 4

## 2023-01-02 NOTE — Discharge Instructions (Addendum)
Your workup today is really reassuring, there is no evidence of edema on your chest x-ray, and your blood work is reassuring.   We were waiting on a urine test to see if your kidneys could be the cause of your swelling, and your urine sample looked reassuring as well.   I recommend that you follow-up with your cardiology, for further tweaking of your medications.  Make sure you let him know, you become more short of breath on exertion.  Additionally there is no cause found for your swelling, please speak with your primary care doctor, as well as your cardiologist in regards to this.

## 2023-01-02 NOTE — ED Triage Notes (Signed)
Pt POV reporting SOB and leg swelling past month, echo scheduled 12/31, now experiencing facial swelling.

## 2023-01-02 NOTE — ED Provider Notes (Signed)
Accepted handoff at shift change from Small PA-C. Please see prior provider note for full HPI.  Briefly: Patient is a 67 y.o. female who presents to the ER for diffuse body swelling x 1 month.  DDX/Plan: Labs reassuring. Pending UA to rule out significant proteinuria which would raise suspicion for nephrotic syndrome.   ED Course / MDM    Medical Decision Making Amount and/or Complexity of Data Reviewed Labs: ordered. Radiology: ordered.  Risk Prescription drug management.   Urinalysis, Routine w reflex microscopic -Urine, Clean Catch  Result Value Ref Range   Color, Urine YELLOW YELLOW   APPearance CLEAR CLEAR   Specific Gravity, Urine 1.009 1.005 - 1.030   pH 6.5 5.0 - 8.0   Glucose, UA NEGATIVE NEGATIVE mg/dL   Hgb urine dipstick NEGATIVE NEGATIVE   Bilirubin Urine NEGATIVE NEGATIVE   Ketones, ur NEGATIVE NEGATIVE mg/dL   Protein, ur 30 (A) NEGATIVE mg/dL   Nitrite NEGATIVE NEGATIVE   Leukocytes,Ua MODERATE (A) NEGATIVE   RBC / HPF 0-5 0 - 5 RBC/hpf   WBC, UA 6-10 0 - 5 WBC/hpf   Bacteria, UA NONE SEEN NONE SEEN   Squamous Epithelial / HPF 0-5 0 - 5 /HPF   Mucus PRESENT    UA with trace protein, not in the range of nephrotic syndrome. No emergent cause found for anasarca. Patient to be discharged with recommendation to follow up with cardiology. Patient discharged in stable condition and all questions answered.   Su Monks, PA-C 01/02/23 1905    Sloan Leiter, DO 01/02/23 2358

## 2023-01-02 NOTE — ED Provider Notes (Signed)
[facility] Provider Note   CSN: [id number] Arrival date & time: 01/02/23  1603     History  Chief Complaint  Patient presents with   Shortness of Breath    Natasha Davis is a 67 y.o. female, history of CVA, mitral stenosis with mitral valve replacement on Coumadin, A-fib, who presents to the ED secondary to diffuse body swelling, this been going on for the last month.  She states that she went to the cardiologist about a month ago, and was taken off her maxide, and put on Lasix 40 mg daily with potassium supplementation.  She states since then, she feels like she has been more swelling, and is having more difficulty breathing.  She states she feels like she is more short of breath especially on exertion, but denies any chest pain.  States she has swelling in her eyes, mouth, and all over her body.  She has not been eating presumably salty food, and has been compliant with her new Lasix, and potassium.  Home Medications Prior to Admission medications   Medication Sig Start Date End Date Taking? Authorizing Provider  acetaminophen (TYLENOL) 500 MG tablet Take 1,000 mg by mouth every 6 (six) hours as needed for moderate pain, mild pain, headache or fever.    [provider]  acetaminophen (TYLENOL) 650 MG CR tablet Take 650 mg by mouth every 8 (eight) hours as needed. Pain    [provider]  atorvastatin (LIPITOR) 20 MG tablet TAKE 1 TABLET BY MOUTH EVERY DAY 11/03/22   Nahser, Deloris Ping, MD  chlorhexidine (PERIDEX) 0.12 % solution Use as directed 15 mLs in the mouth or throat 2 (two) times daily. 01/18/22   Glyn Ade, MD  furosemide (LASIX) 40 MG tablet Take 1 tablet (40 mg total) by mouth daily. 12/17/22   Nahser, Deloris Ping, MD  Homeopathic Products (EARACHE DROPS OT) Place 1 drop into both ears 2 (two) times daily as needed (pain).    [provider]  levETIRAcetam (KEPPRA) 500 MG tablet Take 500 mg by mouth in  the morning.    [provider]  metoprolol tartrate (LOPRESSOR) 50 MG tablet TAKE 1 TABLET BY MOUTH IN THE  MORNING 08/17/22   Nahser, Deloris Ping, MD  Multiple Vitamins-Minerals (CENTRUM SILVER ADULT 50+) TABS Take 1 tablet by mouth in the morning.    [provider]  omeprazole (PRILOSEC) 20 MG capsule TAKE 2 CAPSULES BY MOUTH EVERY DAY 11/11/22   Nahser, Deloris Ping, MD  potassium chloride (KLOR-CON) 10 MEQ tablet Take 1 tablet (10 mEq total) by mouth 2 (two) times daily. 12/17/22   Nahser, Deloris Ping, MD  sodium chloride (OCEAN) 0.65 % SOLN nasal spray Place 1 spray into both nostrils 2 (two) times daily as needed for congestion.    [provider]  warfarin (COUMADIN) 5 MG tablet TAKE 1 AND 1/2 TO 2 TABLETS ONCE DAILY AS DIRECTED BY COUMADIN CLINIC 08/25/21   Iran Ouch, MD  warfarin (COUMADIN) 5 MG tablet TAKE 1 AND 1/2 TABLETS BY MOUTH  DAILY OR AS DIRECTED BY COUMADIN CLINIC 09/13/22   Iran Ouch, MD      Allergies    Penicillins and Penicillins    Review of Systems   Review of Systems  Respiratory:  Positive for shortness of breath.   Cardiovascular:  Negative for chest pain.    Physical Exam Updated Vital Signs BP (!) 140/67 (BP Location: Right Arm)   Pulse 60  Temp 98.2 F (36.8 C) (Oral)   Resp 18   Ht 4\' 11"  (1.499 m)   Wt 101.6 kg   SpO2 93%   BMI 45.24 kg/m  Physical Exam Vitals and nursing note reviewed.  Constitutional:      General: She is not in acute distress.    Appearance: She is well-developed.     Comments: Appears edematous throughout, including periorbitally, along the mouth but nonpitting  HENT:     Head: Normocephalic and atraumatic.  Eyes:     Conjunctiva/sclera: Conjunctivae normal.  Cardiovascular:     Rate and Rhythm: Normal rate and regular rhythm.     Heart sounds: No murmur heard. Pulmonary:     Effort: Pulmonary effort is normal. No respiratory distress.     Breath sounds: Normal breath sounds.   Abdominal:     Palpations: Abdomen is soft.     Tenderness: There is no abdominal tenderness.  Musculoskeletal:        General: No swelling.     Cervical back: Neck supple.     Right lower leg: Edema present.     Left lower leg: Edema present.  Skin:    General: Skin is warm and dry.     Capillary Refill: Capillary refill takes less than 2 seconds.  Neurological:     Mental Status: She is alert.  Psychiatric:        Mood and Affect: Mood normal.     ED Results / Procedures / Treatments   Labs (all labs ordered are listed, but only abnormal results are displayed) Labs Reviewed  CBC WITH DIFFERENTIAL/PLATELET - Abnormal; Notable for the following components:      Result Value   RBC 3.66 (*)    Hemoglobin 11.1 (*)    HCT 35.2 (*)    All other components within normal limits  COMPREHENSIVE METABOLIC PANEL - Abnormal; Notable for the following components:   Glucose, Bld 118 (*)    All other components within normal limits  BRAIN NATRIURETIC PEPTIDE - Abnormal; Notable for the following components:   B Natriuretic Peptide 207.9 (*)    All other components within normal limits  TSH - Abnormal; Notable for the following components:   TSH 4.962 (*)    All other components within normal limits  PROTIME-INR - Abnormal; Notable for the following components:   Prothrombin Time 34.1 (*)    INR 3.3 (*)    All other components within normal limits  T4, FREE  URINALYSIS, ROUTINE W REFLEX MICROSCOPIC  TROPONIN I (HIGH SENSITIVITY)  TROPONIN I (HIGH SENSITIVITY)    EKG EKG Interpretation Date/Time:  Sunday January 02 2023 16:22:25 EST Ventricular Rate:  67 PR Interval:    QRS Duration:  141 QT Interval:  474 QTC Calculation: 474 R Axis:   34  Text Interpretation: Atrial fibrillation Probable left ventricular hypertrophy Borderline T abnormalities, lateral leads Interpretation limited secondary to artifact similar to prior Confirmed by Tanda Rockers ([id number]) on 01/02/2023 4:26:05  PM  Radiology DG Chest Port 1 View  Result Date: 01/02/2023 CLINICAL DATA:  Short of breath. EXAM: PORTABLE CHEST 1 VIEW COMPARISON:  [id number]. FINDINGS: Stable changes from prior cardiac surgery and valve replacement. Cardiac silhouette is mildly enlarged. No mediastinal or hilar masses. Lungs are clear.  No convincing pleural effusion.  No pneumothorax. Skeletal structures are grossly intact. IMPRESSION: No acute cardiopulmonary disease. Electronically Signed   By: Amie Portland M.D.   On: 01/02/2023 17:32    Procedures Procedures  Medications Ordered in ED Medications  albuterol (VENTOLIN HFA) 108 (90 Base) MCG/ACT inhaler 2 puff (has no administration in time range)  furosemide (LASIX) injection 40 mg (40 mg Intravenous Given 01/02/23 1722)    ED Course/ Medical Decision Making/ A&P                                 Medical Decision Making Patient is a 67 year old female, here for shortness of breath, and swelling all over.  She states that this has been going on for the last month, but got worse after her cardiologist changed her medications to Lasix.  We will obtain chest x-ray, BNP, troponins, TSH, given that she has had some palpitations, but has been compliant on her Coumadin.  Will also obtain EKG for further evaluation and analysis to check for any kind of nephrotic syndrome that could be the etiology of the edema, this is perceived by the patient, there is no marked edema on my exam, but more perceived.  Amount and/or Complexity of Data Reviewed Labs: ordered.    Details: BP fairly within normal limits in the 200s, Trope negative, TSH only mildly elevated, PT/INR is fairly within normal limits Radiology: ordered.    Details: Chest x-ray is clear Discussion of management or test interpretation with external provider(s): Discussed with the Lorrin PA, she is assuming care, patient to be discharged after urinalysis, to evaluate for proteinuria, as well as second troponin.  She will  follow-up on these results.  Plan is for patient to follow-up with cardiologist for further medication management, and return if she has worsening symptoms.  Overall her exam today was reassuring, and it seems as the patient has been dealing with the swelling issue for some time.  She will likely need to have part further evaluation by her PCP, and cardiologist if all rest of the testing is negative  Risk Prescription drug management.    Final Clinical Impression(s) / ED Diagnoses Final diagnoses:  Shortness of breath  Edema, unspecified type    Rx / DC Orders ED Discharge Orders          Ordered    Ambulatory referral to Cardiology       Comments: If you have not heard from the Cardiology office within the next 72 hours please call [phone].   01/02/23 1835              Saavi Mceachron, Harley [place], PA 01/02/23 1846    Sloan Leiter, DO 01/02/23 2358

## 2023-01-03 ENCOUNTER — Ambulatory Visit: Payer: 59 | Attending: Cardiovascular Disease | Admitting: Cardiovascular Disease

## 2023-01-03 ENCOUNTER — Encounter: Payer: Self-pay | Admitting: Cardiovascular Disease

## 2023-01-03 ENCOUNTER — Encounter: Payer: Self-pay | Admitting: *Deleted

## 2023-01-03 ENCOUNTER — Other Ambulatory Visit: Payer: Self-pay | Admitting: *Deleted

## 2023-01-03 VITALS — BP 154/86 | HR 69 | Ht 59.0 in | Wt 218.2 lb

## 2023-01-03 DIAGNOSIS — Z79899 Other long term (current) drug therapy: Secondary | ICD-10-CM

## 2023-01-03 DIAGNOSIS — I10 Essential (primary) hypertension: Secondary | ICD-10-CM

## 2023-01-03 DIAGNOSIS — R079 Chest pain, unspecified: Secondary | ICD-10-CM

## 2023-01-03 MED ORDER — TORSEMIDE 20 MG PO TABS: 20.0000 mg | ORAL_TABLET | Freq: Every day | ORAL | 3 refills | Status: DC

## 2023-01-03 NOTE — Patient Instructions (Signed)
Medication Instructions:  Please discontinue Furosemide. Start Torsemide 20 mg once a day. Continue all other medications as listed.  *If you need a refill on your cardiac medications before your next appointment, please call your pharmacy*   Lab Work: Please have blood work at [facility] on 01/25/2023 (BMP)  If you have labs (blood work) drawn today and your tests are completely normal, you will receive your results only by: MyChart Message (if you have MyChart) OR A paper copy in the mail If you have any lab test that is abnormal or we need to change your treatment, we will call you to review the results.   Testing/Procedures: Your physician has requested that you have a lexiscan myoview. For further information please visit [email or web address] Please follow instruction sheet, as given.   Follow-Up: At [facility], you and your health needs are our priority.  As part of our continuing mission to provide you with exceptional heart care, we have created designated Provider Care Teams.  These Care Teams include your primary Cardiologist (physician) and Advanced Practice Providers (APPs -  Physician Assistants and Nurse Practitioners) who all work together to provide you with the care you need, when you need it.  We recommend signing up for the patient portal called "MyChart".  Sign up information is provided on this After Visit Summary.  MyChart is used to connect with patients for Virtual Visits (Telemedicine).  Patients are able to view lab/test results, encounter notes, upcoming appointments, etc.  Non-urgent messages can be sent to your provider as well.   To learn more about what you can do with MyChart, go to [email or web address].    Your next appointment:   3 month(s)  Provider:   Tereso Newcomer, PA-C

## 2023-01-03 NOTE — Progress Notes (Signed)
Natasha Davis Date of Birth  Jun 06, 1955   [address]    [address]    [place], [place]  [address]        1.  Mitral stenosis 2.  Status post mitral valve  placement 3 . Chronic anticoagulation 4.  Hypertension 5.  History of CVA ( prior to being diagnoses with mitral stenosis) 6.  History of claudication 7.  Cigarette smoking  Natasha Davis has done well since I last saw her in December, 2011.  She's not having episodes of chest pain or shortness breath. She's been on Coumadin and her protime levels have been therapeutic.  She quit smoking but started back again.  May 04, 2012  Natasha Davis is doing well from a cardiac standpoint.   She is still smoking.   Jun 20, 2015:  Natasha Davis is seen for a follow up visit  - missed last years office visit.  Has been keeping her INR appts.  No cardiac issues.  BP and HR are good,  Still smoking   March 26, 2016: Natasha Davis is seen today for a follow up visit Has started back smoking   Aug. 29, 2019  Doing ok INR was 1.9 today - have been eating more salad  No CP or dyspnea Has mechanical MVR in 1998.   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  Still smokes   October 27 2018:   Natasha Davis is seen today for a follow-up visit.  She has a history of mitral valve replacement for mitral stenosis.  ECG now shows previous Ant. MI   ( not there on last years ECG  She denies any cp  Still smoking   Feb. 1, 2022:  Natasha Davis is seen today for follow up of her MS and MV replacement  Still smoking ,  Advised cessation .  INR is theraputic  INR levels are therapeutic.  Her last INR was 3.7.  May 11, 2021: Natasha Davis is seen today for follow-up of her mitral stenosis and mitral valve replacement. Still smoking  Having issues with claudication - esp in her left leg Advised her to stop smoking  Will get lower extremity arterial duplex.    August 16, 2022 Natasha Davis is seen today for follow of her mitral stenosis, s/p MVR , atrial fib  Still  eating some salt on a regular basis  .  Still smoking   Nov. 22, 2024 Has been having more shortness of breath , lots of edema leg , face  Stopped smoking in September   Has not seen her primary MD for this   Still eating salty foods.  Natasha Davis - twice a week Ham - several times a week Eats take out several times a month      Echocardiogram from October, 2020 LVEF is mildly depressed at 40 to 45%. Prosthetic mitral valve with no evidence of mitral stenosis Trivial tricuspid regurgitation    January 03, 2023  Seen with [name] ( daughter )   Natasha Davis has a history of mitral valve replacement for severe mitral stenosis and a stroke many years ago. At her last visit I commented that she was still eating very salty diet.  She was seen in the emergency room yesterday with severe shortness of breath and volume overload.  Her B natruretic peptide was 207.9.  Troponin levels were negative. Gave her lasix IV  Was released from the ER  The lasix has not been working as well recently  Will change  the Lasix to torsemide which might work better if better for her.  She is scheduled for a follow-up echocardiogram on January 25, 2023.  Echocardiogram from October, 2020 reveals mildly depressed left ventricular systolic function with EF of 40 to 45%.  Her prosthetic mitral valve seems to be functioning normally. She had trivial tricuspid regurgitation.  Wt was 224 lbs  at her primary MD  Wt today is 218 .    Current Outpatient Medications on File Prior to Visit  Medication Sig Dispense Refill   acetaminophen (TYLENOL) 650 MG CR tablet Take 650 mg by mouth every 8 (eight) hours as needed. Pain     atorvastatin (LIPITOR) 20 MG tablet TAKE 1 TABLET BY MOUTH EVERY DAY 90 tablet 2   chlorhexidine (PERIDEX) 0.12 % solution Use as directed 15 mLs in the mouth or throat 2 (two) times daily. 120 mL 0   furosemide (LASIX) 40 MG tablet Take 1 tablet (40 mg total) by mouth daily. 90 tablet 3    Homeopathic Products (EARACHE DROPS OT) Place 1 drop into both ears 2 (two) times daily as needed (pain).     levETIRAcetam (KEPPRA) 500 MG tablet Take 500 mg by mouth in the morning.     metoprolol tartrate (LOPRESSOR) 50 MG tablet TAKE 1 TABLET BY MOUTH IN THE  MORNING 60 tablet 5   Multiple Vitamins-Minerals (CENTRUM SILVER ADULT 50+) TABS Take 1 tablet by mouth in the morning.     omeprazole (PRILOSEC) 20 MG capsule TAKE 2 CAPSULES BY MOUTH EVERY DAY 180 capsule 2   potassium chloride (KLOR-CON) 10 MEQ tablet Take 1 tablet (10 mEq total) by mouth 2 (two) times daily. 180 tablet 3   sodium chloride (OCEAN) 0.65 % SOLN nasal spray Place 1 spray into both nostrils 2 (two) times daily as needed for congestion.     warfarin (COUMADIN) 5 MG tablet TAKE 1 AND 1/2 TO 2 TABLETS ONCE DAILY AS DIRECTED BY COUMADIN CLINIC 145 tablet 1   No current facility-administered medications on file prior to visit.    Allergies  Allergen Reactions   Penicillins    Penicillins Other (See Comments)    "Couldn't walk"    Past Medical History:  Diagnosis Date   Chronic anticoagulation    Claudication (HCC)    History of CVA (cerebrovascular accident)    Hypertension    Mitral stenosis    Seizure (HCC)    HX    Past Surgical History:  Procedure Laterality Date   CARDIAC CATHETERIZATION  06/01/96   NORMAL LEFT VENTRICULAR SYSTOLIC FUNCTION. EF 60%   CESAREAN SECTION     X2   MITRAL VALVE REPLACEMENT      Social History   Tobacco Use  Smoking Status Every Day   Current packs/day: 0.00   Types: Cigarettes   Last attempt to quit: 01/25/2006   Years since quitting: 16.9  Smokeless Tobacco Never    Social History   Substance and Sexual Activity  Alcohol Use Not Currently    Family History  Problem Relation Age of Onset   Hypertension Mother    Heart disease Father    Hypertension Father    Heart attack Father    Stroke Brother     Reviw of Systems:  Reviewed in the HPI.  All other  systems are negative.     Physical Exam: Blood pressure (!) 154/86, pulse 69, height 4\' 11"  (1.499 m), weight 218 lb 3.2 oz (99 kg), SpO2 93%.  HYPERTENSION CONTROL Vitals:  01/03/23 1629  BP: (!) 154/86    The patient's blood pressure is elevated above target today.  In order to address the patient's elevated BP:        GEN:  Well nourished, well developed in no acute distress HEENT: Normal NECK: No JVD; No carotid bruits LYMPHATICS: No lymphadenopathy CARDIAC: RRR ,  crisp mechanical S1  RESPIRATORY:  Clear to auscultation without rales, wheezing or rhonchi  ABDOMEN: Soft, non-tender, non-distended MUSCULOSKELETAL:  No edema; No deformity  SKIN: Warm and dry NEUROLOGIC:  Alert and oriented x 3      ECG:       Assessment / Plan:   1.  Mitral stenosis - s/p MVR ,   Her prosthetic mechanical mitral valve sounds crisp.       2.  Acute on chronic combined systolic and diastolic congestive heart failure: Lafonda Mosses has a history of mild CHF.   She presented to the ER yesterday with volume overload, shortness of breath and weight gain.  She admits that she is continue to eat salty foods since I last saw her.  She was treated with Lasix and her edema has improved.  She is not convinced that the Lasix continues to get rid of any fluid.  Will start her on torsemide 20 mg a day.  I have given her the okay to double her torsemide if she feels like she is gaining weight.  Will have her return to see Tereso Newcomer, PA in 3 months.    3 . Atrial fib: Has chronic atrial fibrillation.  Continue warfarin.    4.  Hypertension -       5.  History of CVA -     6.  History of claudication -   7.  Cigarette smoking -   she quit smoking several months ago .   She may have COPD .   She may benefit from seeing a pulmonologist at some point          Kristeen Miss, MD  01/03/2023 4:52 PM    [facility] [address] [place],  [address] [place], [place]   [address] Pager [phone] Phone: [phone]; Fax: [phone]

## 2023-01-13 ENCOUNTER — Ambulatory Visit: Payer: 59 | Attending: Internal Medicine

## 2023-01-13 DIAGNOSIS — Z952 Presence of prosthetic heart valve: Secondary | ICD-10-CM

## 2023-01-13 DIAGNOSIS — Z5181 Encounter for therapeutic drug level monitoring: Secondary | ICD-10-CM

## 2023-01-13 LAB — POCT INR: INR: 5.2 — AB (ref 2.0–3.0)

## 2023-01-13 NOTE — Patient Instructions (Signed)
Description   HOLD today's dose and tomorrow's dose and then START taking Warfarin 1.5 tablets daily except 1 tablet on Sundays, Mondays, Wednesdays, and Fridays.  Recheck in 1 week (normally 6 weeks).  Stay consistent with greens each week (3 times per week)  Call us # [phone]  Coumadin Clinic with any medication changes or concerns.

## 2023-01-18 ENCOUNTER — Encounter (HOSPITAL_COMMUNITY): Payer: Self-pay

## 2023-01-20 ENCOUNTER — Ambulatory Visit: Payer: 59 | Attending: Cardiology | Admitting: Pharmacist

## 2023-01-20 DIAGNOSIS — Z5181 Encounter for therapeutic drug level monitoring: Secondary | ICD-10-CM | POA: Diagnosis not present

## 2023-01-20 DIAGNOSIS — Z952 Presence of prosthetic heart valve: Secondary | ICD-10-CM | POA: Diagnosis not present

## 2023-01-20 LAB — POCT INR: INR: 1.7 — AB (ref 2.0–3.0)

## 2023-01-20 NOTE — Patient Instructions (Addendum)
Description   Take 2 tablets (10mg ) today and 1.5 tablets tomorrow (7.5mg ) then continue taking Warfarin 1.5 tablets daily except 1 tablet on Sundays, Mondays, Wednesdays, and Fridays.  Recheck in 2 week (normally 6 weeks).  Stay consistent with greens each week (3 times per week)  Call us # [phone]  Coumadin Clinic with any medication changes or concerns.

## 2023-01-24 ENCOUNTER — Ambulatory Visit: Payer: 59 | Admitting: Cardiology

## 2023-01-25 ENCOUNTER — Ambulatory Visit (HOSPITAL_COMMUNITY): Payer: 59 | Attending: Cardiology

## 2023-01-25 ENCOUNTER — Ambulatory Visit (HOSPITAL_BASED_OUTPATIENT_CLINIC_OR_DEPARTMENT_OTHER): Payer: 59

## 2023-01-25 DIAGNOSIS — R079 Chest pain, unspecified: Secondary | ICD-10-CM

## 2023-01-25 DIAGNOSIS — Z952 Presence of prosthetic heart valve: Secondary | ICD-10-CM | POA: Diagnosis not present

## 2023-01-25 DIAGNOSIS — I10 Essential (primary) hypertension: Secondary | ICD-10-CM | POA: Insufficient documentation

## 2023-01-25 DIAGNOSIS — Z79899 Other long term (current) drug therapy: Secondary | ICD-10-CM | POA: Diagnosis not present

## 2023-01-25 LAB — ECHOCARDIOGRAM COMPLETE
Area-P 1/2: 3.96 cm2
Height: 59 in
MV VTI: 0.92 cm2
S' Lateral: 3.1 cm
Weight: 3488 [oz_av]

## 2023-01-25 LAB — MYOCARDIAL PERFUSION IMAGING
LV dias vol: 111 mL (ref 46–106)
LV sys vol: 53 mL
Nuc Stress EF: 52 %
Peak HR: 92 {beats}/min
Rest HR: 80 {beats}/min
Rest Nuclear Isotope Dose: 10.4 mCi
SDS: 0
SRS: 1
SSS: 1
ST Depression (mm): 0 mm
Stress Nuclear Isotope Dose: 32.7 mCi
TID: 1.03

## 2023-01-25 MED ORDER — TECHNETIUM TC 99M TETROFOSMIN IV KIT
10.4000 | PACK | Freq: Once | INTRAVENOUS | Status: AC | PRN
  Administered 2023-01-25: 10.4 via INTRAVENOUS

## 2023-01-25 MED ORDER — TECHNETIUM TC 99M TETROFOSMIN IV KIT
32.7000 | PACK | Freq: Once | INTRAVENOUS | Status: AC | PRN
Start: 2023-01-25 — End: 2023-01-25
  Administered 2023-01-25: 32.7 via INTRAVENOUS

## 2023-01-25 MED ORDER — REGADENOSON 0.4 MG/5ML IV SOLN
0.4000 mg | Freq: Once | INTRAVENOUS | Status: AC
  Administered 2023-01-25: 0.4 mg via INTRAVENOUS

## 2023-01-26 LAB — BASIC METABOLIC PANEL
BUN/Creatinine Ratio: 17 (ref 12–28)
BUN: 14 mg/dL (ref 8–27)
CO2: 30 mmol/L — ABNORMAL HIGH (ref 20–29)
Calcium: 8.9 mg/dL (ref 8.7–10.3)
Chloride: 98 mmol/L (ref 96–106)
Creatinine, Ser: 0.83 mg/dL (ref 0.57–1.00)
Glucose: 134 mg/dL — ABNORMAL HIGH (ref 70–99)
Potassium: 3.4 mmol/L — ABNORMAL LOW (ref 3.5–5.2)
Sodium: 142 mmol/L (ref 134–144)
eGFR: 76 mL/min/{1.73_m2} (ref >59)

## 2023-02-01 ENCOUNTER — Telehealth: Payer: Self-pay

## 2023-02-01 DIAGNOSIS — I480 Paroxysmal atrial fibrillation: Secondary | ICD-10-CM

## 2023-02-01 MED ORDER — POTASSIUM CHLORIDE ER 10 MEQ PO TBCR: 20.0000 meq | EXTENDED_RELEASE_TABLET | Freq: Two times a day (BID) | ORAL | 3 refills | Status: DC

## 2023-02-01 NOTE — Telephone Encounter (Signed)
-----   Message from Aleene Passe sent at 01/29/2023  7:03 AM EST ----- Glucose remains mildly elevated  Potassium is slightly low Increase potassium chloride  to 20 meq twice a day ( she should take 2 of the 10 meq tablets twice a day )  She was recently found to have atrial fib .  Please refer her to the Afib clinic

## 2023-02-01 NOTE — Telephone Encounter (Signed)
 Called and spoke with patient who verbalized understanding of dose increase on potassium. New rx sent to pharmacy. Referral to A-fib clinic placed at this time and she knows she will be called to schedule.

## 2023-02-02 ENCOUNTER — Ambulatory Visit: Payer: 59

## 2023-02-09 ENCOUNTER — Ambulatory Visit: Payer: 59 | Attending: Cardiovascular Disease

## 2023-02-09 DIAGNOSIS — Z5181 Encounter for therapeutic drug level monitoring: Secondary | ICD-10-CM

## 2023-02-09 DIAGNOSIS — Z952 Presence of prosthetic heart valve: Secondary | ICD-10-CM

## 2023-02-09 LAB — POCT INR: INR: 3.1 — AB (ref 2.0–3.0)

## 2023-02-09 NOTE — Patient Instructions (Signed)
 Description   Continue taking Warfarin 1.5 tablets daily except 1 tablet on Sundays, Mondays, Wednesdays, and Fridays.  Recheck in 3 week (normally 6 weeks).  Stay consistent with greens each week (3 times per week)  Call us  # [phone]  Coumadin  Clinic with any medication changes or concerns.

## 2023-02-16 ENCOUNTER — Ambulatory Visit (HOSPITAL_COMMUNITY): Payer: 59 | Admitting: Internal Medicine

## 2023-02-16 ENCOUNTER — Encounter (HOSPITAL_COMMUNITY): Payer: Self-pay

## 2023-02-23 ENCOUNTER — Inpatient Hospital Stay (HOSPITAL_COMMUNITY): Admission: RE | Admit: 2023-02-23 | Payer: 59 | Source: Ambulatory Visit | Admitting: Internal Medicine

## 2023-02-23 ENCOUNTER — Encounter (HOSPITAL_COMMUNITY): Payer: Self-pay

## 2023-02-23 NOTE — Progress Notes (Incomplete)
Primary Care Physician: Assunta Found, MD Primary Cardiologist: Kristeen Miss, MD Electrophysiologist: None  {Click to update primary MD,subspecialty MD or APP then REFRESH:1}   Referring Physician: Dr. Elease Hashimoto  {Removed [facility], PMH, PSH, ALLERGY, CMED, and SOC :1}   Natasha Davis is a 68 y.o. female with a history of mitral stenosis s/p MVR, systolic dysfunction, HTN, history of CVA, tobacco use, and persistent atrial fibrillation who presents for consultation in the [facility]ibr[facility]. Seen by cardiologist in July 2024 and ECG shows Afib presumably for the first time. Repeat ECG in ED visit 12/2022 also showed rate controlled Afib. Patient is on coumadin for a CHADS2VASC score of 6.  On evaluation today, she is currently in ***.   Today, she denies symptoms of ***palpitations, chest pain, shortness of breath, orthopnea, PND, lower extremity edema, dizziness, presyncope, syncope, snoring, daytime somnolence, bleeding, or neurologic sequela. The patient is tolerating medications without difficulties and is otherwise without complaint today.    Atrial Fibrillation Risk Factors:  she {Action; does/does not:19097} have symptoms or diagnosis of sleep apnea. she {ACTION; IS/IS ZOX:[id number]} compliant with CPAP therapy. she {Action; does/does not:19097} have a history of rheumatic fever. she {Action; does/does not:19097} have a history of alcohol use. The patient {Action; does/does not:19097} have a history of early familial atrial fibrillation or other arrhythmias.  she has a BMI of There is no height or weight on file to calculate BMI.. There were no vitals filed for this visit.  Current Outpatient Medications  Medication Sig Dispense Refill   acetaminophen (TYLENOL) 650 MG CR tablet Take 650 mg by mouth every 8 (eight) hours as needed. Pain     atorvastatin (LIPITOR) 20 MG tablet TAKE 1 TABLET BY MOUTH EVERY DAY 90 tablet 2   chlorhexidine (PERIDEX) 0.12 %  solution Use as directed 15 mLs in the mouth or throat 2 (two) times daily. 120 mL 0   Homeopathic Products (EARACHE DROPS OT) Place 1 drop into both ears 2 (two) times daily as needed (pain).     levETIRAcetam (KEPPRA) 500 MG tablet Take 500 mg by mouth in the morning.     metoprolol tartrate (LOPRESSOR) 50 MG tablet TAKE 1 TABLET BY MOUTH IN THE  MORNING 60 tablet 5   Multiple Vitamins-Minerals (CENTRUM SILVER ADULT 50+) TABS Take 1 tablet by mouth in the morning.     omeprazole (PRILOSEC) 20 MG capsule TAKE 2 CAPSULES BY MOUTH EVERY DAY 180 capsule 2   potassium chloride (KLOR-CON) 10 MEQ tablet Take 2 tablets (20 mEq total) by mouth 2 (two) times daily. 360 tablet 3   sodium chloride (OCEAN) 0.65 % SOLN nasal spray Place 1 spray into both nostrils 2 (two) times daily as needed for congestion.     torsemide (DEMADEX) 20 MG tablet Take 1 tablet (20 mg total) by mouth daily. 90 tablet 3   warfarin (COUMADIN) 5 MG tablet TAKE 1 AND 1/2 TO 2 TABLETS ONCE DAILY AS DIRECTED BY COUMADIN CLINIC 145 tablet 1   No current facility-administered medications for this visit.    Atrial Fibrillation Management history:  Previous antiarrhythmic drugs: none Previous cardioversions: none Previous ablations: none Anticoagulation history: coumadin   ROS- All systems are reviewed and negative except as per the HPI above.  Physical Exam: There were no vitals taken for this visit.  GEN: Well nourished, well developed in no acute distress NECK: No JVD; No carotid bruits CARDIAC: {EPRHYTHM:28826}, no murmurs, rubs, gallops RESPIRATORY:  Clear to auscultation  without rales, wheezing or rhonchi  ABDOMEN: Soft, non-tender, non-distended EXTREMITIES:  No edema; No deformity   EKG today demonstrates ***  Echo 01/25/23 demonstrated  1. Left ventricular ejection fraction, by estimation, is 40 to 45%. The  left ventricle has mildly decreased function. The left ventricle has no  regional wall motion  abnormalities. There is mild concentric left  ventricular hypertrophy. Left ventricular  diastolic function could not be evaluated.   2. Right ventricular systolic function is mildly reduced. The right  ventricular size is mildly enlarged. There is moderately elevated  pulmonary artery systolic pressure. The estimated right ventricular  systolic pressure is 52.0 mmHg.   3. Left atrial size was severely dilated.   4. Right atrial size was moderately dilated.   5. The mitral valve has been repaired/replaced with mechanical MVR. No  evidence of mitral valve regurgitation.   6. Tricuspid valve regurgitation is mild to moderate.   7. The aortic valve is grossly normal. There is mild calcification of the  aortic valve. Aortic valve regurgitation is not visualized.   8. Evidence of atrial level shunting detected by color flow Doppler.   ASSESSMENT & PLAN CHA2DS2-VASc Score =    The patient's score is based upon:   {Click here to calculate score.  REFRESH note before signing. :1}     ASSESSMENT AND PLAN: {Select the correct AFib Diagnosis                 :[id number]}  Persistent  Chads 6   Follow up ***   Lake Bells, PA-C  Afib Clinic [facility] [address] [place], [place] [address] [phone]

## 2023-03-02 ENCOUNTER — Ambulatory Visit: Payer: 59

## 2023-03-04 ENCOUNTER — Ambulatory Visit: Payer: 59 | Attending: Cardiovascular Disease

## 2023-03-04 DIAGNOSIS — Z5181 Encounter for therapeutic drug level monitoring: Secondary | ICD-10-CM | POA: Diagnosis not present

## 2023-03-04 DIAGNOSIS — Z952 Presence of prosthetic heart valve: Secondary | ICD-10-CM

## 2023-03-04 LAB — POCT INR: INR: 4.6 — AB (ref 2.0–3.0)

## 2023-03-04 NOTE — Patient Instructions (Signed)
 Hold tonight only then Continue taking Warfarin 1.5 tablets daily except 1 tablet on Sundays, Mondays, Wednesdays, and Fridays.  Recheck in 4 weeks (normally 6 weeks).  Stay consistent with greens each week (3 times per week)  Call us  # [phone]  Coumadin  Clinic with any medication changes or concerns.

## 2023-03-21 ENCOUNTER — Ambulatory Visit (HOSPITAL_COMMUNITY)
Admission: RE | Admit: 2023-03-21 | Discharge: 2023-03-21 | Disposition: A | Discharge status: Home / Self Care | Payer: 59 | Source: Ambulatory Visit | Attending: Internal Medicine | Admitting: Internal Medicine

## 2023-03-21 VITALS — BP 118/72 | HR 62 | Ht 59.0 in | Wt 211.0 lb

## 2023-03-21 DIAGNOSIS — I480 Paroxysmal atrial fibrillation: Secondary | ICD-10-CM

## 2023-03-21 DIAGNOSIS — I110 Hypertensive heart disease with heart failure: Secondary | ICD-10-CM | POA: Insufficient documentation

## 2023-03-21 DIAGNOSIS — Z720 Tobacco use: Secondary | ICD-10-CM | POA: Insufficient documentation

## 2023-03-21 DIAGNOSIS — Z7901 Long term (current) use of anticoagulants: Secondary | ICD-10-CM | POA: Diagnosis not present

## 2023-03-21 DIAGNOSIS — I5020 Unspecified systolic (congestive) heart failure: Secondary | ICD-10-CM | POA: Diagnosis not present

## 2023-03-21 DIAGNOSIS — Z952 Presence of prosthetic heart valve: Secondary | ICD-10-CM | POA: Insufficient documentation

## 2023-03-21 DIAGNOSIS — I4819 Other persistent atrial fibrillation: Secondary | ICD-10-CM | POA: Diagnosis not present

## 2023-03-21 DIAGNOSIS — Z8673 Personal history of transient ischemic attack (TIA), and cerebral infarction without residual deficits: Secondary | ICD-10-CM | POA: Insufficient documentation

## 2023-03-21 DIAGNOSIS — D6869 Other thrombophilia: Secondary | ICD-10-CM | POA: Diagnosis not present

## 2023-03-21 NOTE — Progress Notes (Signed)
 Primary Care Physician: Assunta Found, MD Primary Cardiologist: Kristeen Miss, MD Electrophysiologist: None     Referring Physician: Dr. Delora Fuel is a 68 y.o. female with a history of mitral stenosis s/p MVR, systolic dysfunction, HTN, history of CVA, tobacco use, and persistent atrial fibrillation who presents for consultation in the [facility] Atrial Fibrillation [facility]. Seen by cardiologist in July 2024 and ECG shows Afib presumably for the first time. Repeat ECG in ED visit 12/2022 also showed rate controlled Afib. Patient is on coumadin for a CHADS2VASC score of 6.  On evaluation today, she is currently in Afib. She does not have cardiac awareness of arrhythmia. She is on coumadin due to MVR. She denies snoring or stop breathing at night.  Today, she denies symptoms of palpitations, chest pain, shortness of breath, orthopnea, PND, lower extremity edema, dizziness, presyncope, syncope, snoring, daytime somnolence, bleeding, or neurologic sequela. The patient is tolerating medications without difficulties and is otherwise without complaint today.    Atrial Fibrillation Risk Factors:  she does not have symptoms or diagnosis of sleep apnea.  she has a BMI of Body mass index is 42.62 kg/m.Marland Kitchen Filed Weights   03/21/23 1533  Weight: 95.7 kg    Current Outpatient Medications  Medication Sig Dispense Refill   atorvastatin (LIPITOR) 20 MG tablet TAKE 1 TABLET BY MOUTH EVERY DAY 90 tablet 2   levETIRAcetam (KEPPRA) 500 MG tablet Take 500 mg by mouth in the morning.     metoprolol tartrate (LOPRESSOR) 50 MG tablet TAKE 1 TABLET BY MOUTH IN THE  MORNING 60 tablet 5   Multiple Vitamins-Minerals (CENTRUM SILVER ADULT 50+) TABS Take 1 tablet by mouth in the morning.     omeprazole (PRILOSEC) 20 MG capsule TAKE 2 CAPSULES BY MOUTH EVERY DAY 180 capsule 2   potassium chloride (KLOR-CON) 10 MEQ tablet Take 2 tablets (20 mEq total) by mouth 2 (two) times daily. 360 tablet 3    torsemide (DEMADEX) 20 MG tablet Take 1 tablet (20 mg total) by mouth daily. 90 tablet 3   warfarin (COUMADIN) 5 MG tablet TAKE 1 AND 1/2 TO 2 TABLETS ONCE DAILY AS DIRECTED BY COUMADIN CLINIC 145 tablet 1   acetaminophen (TYLENOL) 650 MG CR tablet Take 650 mg by mouth every 8 (eight) hours as needed. Pain     chlorhexidine (PERIDEX) 0.12 % solution Use as directed 15 mLs in the mouth or throat 2 (two) times daily. 120 mL 0   Homeopathic Products (EARACHE DROPS OT) Place 1 drop into both ears 2 (two) times daily as needed (pain).     sodium chloride (OCEAN) 0.65 % SOLN nasal spray Place 1 spray into both nostrils 2 (two) times daily as needed for congestion.     No current facility-administered medications for this encounter.    Atrial Fibrillation Management history:  Previous antiarrhythmic drugs: none Previous cardioversions: none Previous ablations: none Anticoagulation history: coumadin   ROS- All systems are reviewed and negative except as per the HPI above.  Physical Exam: BP 118/72   Pulse 62   Ht 4\' 11"  (1.499 m)   Wt 95.7 kg   BMI 42.62 kg/m   GEN: Well nourished, well developed in no acute distress NECK: No JVD; No carotid bruits CARDIAC: Irregularly irregular rate and rhythm, no murmurs, rubs, gallops RESPIRATORY:  Clear to auscultation without rales, wheezing or rhonchi  ABDOMEN: Soft, non-tender, non-distended EXTREMITIES:  No edema; No deformity   EKG today demonstrates  Vent. rate 62 BPM PR interval * ms QRS duration 80 ms QT/QTcB 442/448 ms P-R-T axes * 27 7 Atrial fibrillation Nonspecific T wave abnormality Abnormal ECG When compared with ECG of 25-Jan-2023 11:33, PREVIOUS ECG IS PRESENT  Echo 01/25/23 demonstrated  1. Left ventricular ejection fraction, by estimation, is 40 to 45%. The  left ventricle has mildly decreased function. The left ventricle has no  regional wall motion abnormalities. There is mild concentric left  ventricular  hypertrophy. Left ventricular  diastolic function could not be evaluated.   2. Right ventricular systolic function is mildly reduced. The right  ventricular size is mildly enlarged. There is moderately elevated  pulmonary artery systolic pressure. The estimated right ventricular  systolic pressure is 52.0 mmHg.   3. Left atrial size was severely dilated.   4. Right atrial size was moderately dilated.   5. The mitral valve has been repaired/replaced with mechanical MVR. No  evidence of mitral valve regurgitation.   6. Tricuspid valve regurgitation is mild to moderate.   7. The aortic valve is grossly normal. There is mild calcification of the  aortic valve. Aortic valve regurgitation is not visualized.   8. Evidence of atrial level shunting detected by color flow Doppler.   ASSESSMENT & PLAN CHA2DS2-VASc Score = 6  The patient's score is based upon: CHF History: 1 HTN History: 1 Diabetes History: 0 Stroke History: 2 Vascular Disease History: 0 Age Score: 1 Gender Score: 1       ASSESSMENT AND PLAN: Persistent Atrial Fibrillation (ICD10:  I48.19) The patient's CHA2DS2-VASc score is 6, indicating a 9.7% annual risk of stroke.    She is currently in Afib. She does not have cardiac awareness of arrhythmia. Given review of previous ECGs, it appears she may have been in Afib since July 2024. We discussed the option for treatment, if she chooses treatment, which at this time would recommend cardioversion as initial procedure to try to convert her to NSR. We discussed the risks vs benefits of this procedure. After discussion, she will consider option but unsure if would proceed given potential for adverse effect and she notes to be asymptomatic.  Secondary Hypercoagulable State (ICD10:  D68.69) The patient is at significant risk for stroke/thromboembolism based upon her CHA2DS2-VASc Score of 6.  Continue Warfarin (Coumadin).     Follow up as scheduled with Dr. Elease Hashimoto. Follow up Afib  clinic prn.   Lake Bells, PA-C  Afib Clinic [facility] [address] [place], [place] [address] [phone]

## 2023-03-27 ENCOUNTER — Encounter: Payer: Self-pay | Admitting: Cardiovascular Disease

## 2023-03-27 NOTE — Progress Notes (Unsigned)
 Natasha Davis Date of Birth  [birth year]   [address]    [address]    [place], [place]  [address]        1.  Mitral stenosis 2.  Status post mitral valve  placement 3 . Chronic anticoagulation 4.  Hypertension 5.  History of CVA ( prior to being diagnoses with mitral stenosis) 6.  History of claudication 7.  Cigarette smoking  Natasha Davis has done well since I last saw her in December, 2011.  She's not having episodes of chest pain or shortness breath. She's been on Coumadin and her protime levels have been therapeutic.  She quit smoking but started back again.  May 04, 2012  Natasha Davis is doing well from a cardiac standpoint.   She is still smoking.   Jun 20, 2015:  Natasha Davis is seen for a follow up visit  - missed last years office visit.  Has been keeping her INR appts.  No cardiac issues.  BP and HR are good,  Still smoking   March 26, 2016: Natasha Davis is seen today for a follow up visit Has started back smoking   Aug. 29, 2019  Doing ok INR was 1.9 today - have been eating more salad  No CP or dyspnea Has mechanical MVR in 1998.   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  Still smokes   October 27 2018:   Natasha Davis is seen today for a follow-up visit.  She has a history of mitral valve replacement for mitral stenosis.  ECG now shows previous Ant. MI   ( not there on last years ECG  She denies any cp  Still smoking   Feb. 1, 2022:  Natasha Davis is seen today for follow up of her MS and MV replacement  Still smoking ,  Advised cessation .  INR is theraputic  INR levels are therapeutic.  Her last INR was 3.7.  May 11, 2021: Natasha Davis is seen today for follow-up of her mitral stenosis and mitral valve replacement. Still smoking  Having issues with claudication - esp in her left leg Advised her to stop smoking  Will get lower extremity arterial duplex.    August 16, 2022 Natasha Davis is seen today for follow of her mitral stenosis, s/p MVR , atrial fib  Still  eating some salt on a regular basis  .  Still smoking   Nov. 22, 2024 Has been having more shortness of breath , lots of edema leg , face  Stopped smoking in September   Has not seen her primary MD for this   Still eating salty foods.  Natasha Davis - twice a week Ham - several times a week Eats take out several times a month      Echocardiogram from October, 2020 LVEF is mildly depressed at 40 to 45%. Prosthetic mitral valve with no evidence of mitral stenosis Trivial tricuspid regurgitation    January 03, 2023  Seen with [name] ( daughter )   Natasha Davis has a history of mitral valve replacement for severe mitral stenosis and a stroke many years ago. At her last visit I commented that she was still eating very salty diet.  She was seen in the emergency room yesterday with severe shortness of breath and volume overload.  Her B natruretic peptide was 207.9.  Troponin levels were negative. Gave her lasix IV  Was released from the ER  The lasix has not been working as well recently  Will change  the Lasix to torsemide which might work better if better for her.  She is scheduled for a follow-up echocardiogram on January 25, 2023.  Echocardiogram from October, 2020 reveals mildly depressed left ventricular systolic function with EF of 40 to 45%.  Her prosthetic mitral valve seems to be functioning normally. She had trivial tricuspid regurgitation.  Wt was 224 lbs  at her primary MD  Wt today is 218 .   March 28, 2023  [name] is seen for follow up of her CHF,  COPD  We started her on Torsemide 20 mg a day  She has notice significant diuresis   Wt os 212 lbs ( down 6 lbs from last visit )    BP is mildly elevated - she did not take her Bp meds yet today .  Has been seen in the Afib clinic  She is not inclined to have a cardioversion   We added torsemide during her last visit.  Her last potassium level was 3.4.  Will add spironolactone 25 mg a day.  Will check basic metabolic  profile in 2 weeks.      Current Outpatient Medications on File Prior to Visit  Medication Sig Dispense Refill   acetaminophen (TYLENOL) 650 MG CR tablet Take 650 mg by mouth every 8 (eight) hours as needed. Pain     atorvastatin (LIPITOR) 20 MG tablet TAKE 1 TABLET BY MOUTH EVERY DAY 90 tablet 2   chlorhexidine (PERIDEX) 0.12 % solution Use as directed 15 mLs in the mouth or throat 2 (two) times daily. 120 mL 0   Homeopathic Products (EARACHE DROPS OT) Place 1 drop into both ears 2 (two) times daily as needed (pain).     levETIRAcetam (KEPPRA) 500 MG tablet Take 500 mg by mouth in the morning.     metoprolol tartrate (LOPRESSOR) 50 MG tablet TAKE 1 TABLET BY MOUTH IN THE  MORNING 60 tablet 5   Multiple Vitamins-Minerals (CENTRUM SILVER ADULT 50+) TABS Take 1 tablet by mouth in the morning.     omeprazole (PRILOSEC) 20 MG capsule TAKE 2 CAPSULES BY MOUTH EVERY DAY 180 capsule 2   potassium chloride (KLOR-CON) 10 MEQ tablet Take 2 tablets (20 mEq total) by mouth 2 (two) times daily. 360 tablet 3   sodium chloride (OCEAN) 0.65 % SOLN nasal spray Place 1 spray into both nostrils 2 (two) times daily as needed for congestion.     torsemide (DEMADEX) 20 MG tablet Take 1 tablet (20 mg total) by mouth daily. 90 tablet 3   warfarin (COUMADIN) 5 MG tablet TAKE 1 AND 1/2 TO 2 TABLETS ONCE DAILY AS DIRECTED BY COUMADIN CLINIC 145 tablet 1   No current facility-administered medications on file prior to visit.    Allergies  Allergen Reactions   Penicillins    Penicillins Other (See Comments)    "Couldn't walk"    Past Medical History:  Diagnosis Date   Chronic anticoagulation    Claudication (HCC)    History of CVA (cerebrovascular accident)    Hypertension    Mitral stenosis    Seizure (HCC)    HX    Past Surgical History:  Procedure Laterality Date   CARDIAC CATHETERIZATION  06/01/96   NORMAL LEFT VENTRICULAR SYSTOLIC FUNCTION. EF 60%   CESAREAN SECTION     X2   MITRAL VALVE  REPLACEMENT      Social History   Tobacco Use  Smoking Status Former   Current packs/day: 0.00   Types: Cigarettes  Quit date: 01/25/2006   Years since quitting: 17.1  Smokeless Tobacco Never    Social History   Substance and Sexual Activity  Alcohol Use Not Currently    Family History  Problem Relation Age of Onset   Hypertension Mother    Heart disease Father    Hypertension Father    Heart attack Father    Stroke Brother     Reviw of Systems:  Reviewed in the HPI.  All other systems are negative.      Physical Exam: Blood pressure (!) 140/80, pulse 76, height 4\' 11"  (1.499 m), weight 212 lb (96.2 kg).  HYPERTENSION CONTROL Vitals:   03/28/23 1146 03/28/23 1205  BP: (!) 140/80 (!) 140/80    The patient's blood pressure is elevated above target today.  In order to address the patient's elevated BP:        GEN:  Well nourished, well developed in no acute distress HEENT: Normal NECK: No JVD; No carotid bruits LYMPHATICS: No lymphadenopathy CARDIAC: irreg. Irreg.  Mechanical S1  RESPIRATORY:  Clear to auscultation without rales, wheezing or rhonchi  ABDOMEN: Soft, non-tender, non-distended MUSCULOSKELETAL:  No edema; No deformity  SKIN: Warm and dry NEUROLOGIC:  Alert and oriented x 3      ECG:       Assessment / Plan:   1.  Mitral stenosis - s/p MVR ,   Her prosthetic mechanical mitral valve sounds crisp.       2.  Acute on chronic combined systolic and diastolic congestive heart failure: Natasha Davis has a history of mild CHF.  She has had a brisk diuresis with torsemide.  Her potassium levels are slightly reduced.  Will add spironolactone  25 mg a day  which also should be helpful for her congestive heart failure.  Will check basic metabolic profile in 2 weeks.       3 . Atrial fib: Persistent atrial fibrillation.  She has seen the A-fib clinic.  She is not inclined to have a cardioversion.  She is completely asymptomatic.  Will continue with  anticoagulation and rate control.    4.  Hypertension -   blood pressure is mildly elevated today but she has not taken her medicines.  Continue current medications.  Will continue to monitor.    5.  History of CVA -     6.  History of claudication -   7.  Cigarette smoking -     advised cessation          Kristeen Miss, MD  03/28/2023 12:06 PM    [facility] [address] [place],  [address] [place], [place]  [address] Pager [phone] Phone: [phone]; Fax: [phone]

## 2023-03-28 ENCOUNTER — Ambulatory Visit: Payer: 59 | Attending: Cardiovascular Disease | Admitting: Cardiovascular Disease

## 2023-03-28 ENCOUNTER — Encounter: Payer: Self-pay | Admitting: Cardiovascular Disease

## 2023-03-28 VITALS — BP 140/80 | HR 76 | Ht 59.0 in | Wt 212.0 lb

## 2023-03-28 DIAGNOSIS — I4819 Other persistent atrial fibrillation: Secondary | ICD-10-CM | POA: Diagnosis not present

## 2023-03-28 DIAGNOSIS — I10 Essential (primary) hypertension: Secondary | ICD-10-CM | POA: Diagnosis not present

## 2023-03-28 MED ORDER — SPIRONOLACTONE 25 MG PO TABS: 25.0000 mg | ORAL_TABLET | Freq: Every day | ORAL | 3 refills | Status: AC

## 2023-03-28 NOTE — Patient Instructions (Signed)
 Medication Instructions:  START Spironolactone 25mg  daily *If you need a refill on your cardiac medications before your next appointment, please call your pharmacy*   Lab Work: BMET in 2 weeks If you have labs (blood work) drawn today and your tests are completely normal, you will receive your results only by: MyChart Message (if you have MyChart) OR A paper copy in the mail If you have any lab test that is abnormal or we need to change your treatment, we will call you to review the results.  Follow-Up: At [facility], you and your health needs are our priority.  As part of our continuing mission to provide you with exceptional heart care, we have created designated Provider Care Teams.  These Care Teams include your primary Cardiologist (physician) and Advanced Practice Providers (APPs -  Physician Assistants and Nurse Practitioners) who all work together to provide you with the care you need, when you need it.  Your next appointment:   6 month(s)  Provider:   Kristeen Miss, MD      1st Floor: - Lobby - Registration  - Pharmacy  - Lab - Cafe  2nd Floor: - PV Lab - Diagnostic Testing (echo, CT, nuclear med)  3rd Floor: - Vacant  4th Floor: - TCTS (cardiothoracic surgery) - AFib Clinic - Structural Heart Clinic - Vascular Surgery  - Vascular Ultrasound  5th Floor: - HeartCare Cardiology (general and EP) - Clinical Pharmacy for coumadin, hypertension, lipid, weight-loss medications, and med management appointments    Valet parking services will be available as well.

## 2023-03-30 ENCOUNTER — Other Ambulatory Visit: Payer: Self-pay | Admitting: Cardiovascular Disease

## 2023-03-30 ENCOUNTER — Telehealth: Payer: Self-pay | Admitting: Cardiovascular Disease

## 2023-03-30 MED ORDER — TORSEMIDE 20 MG PO TABS: 20.0000 mg | ORAL_TABLET | Freq: Every day | ORAL | 3 refills | Status: DC

## 2023-03-30 NOTE — Telephone Encounter (Signed)
 Pt's medication was sent to pt's pharmacy as requested. Confirmation received.

## 2023-03-30 NOTE — Telephone Encounter (Signed)
*  STAT* If patient is at the pharmacy, call can be transferred to refill team.   1. Which medications need to be refilled? (please list name of each medication and dose if known)   torsemide (DEMADEX) 20 MG tablet     4. Which pharmacy/location (including street and city if local pharmacy) is medication to be sent to? [id number] [place], [place] - [address]     5. Do they need a 30 day or 90 day supply? 90

## 2023-04-01 ENCOUNTER — Ambulatory Visit: Payer: 59

## 2023-04-08 ENCOUNTER — Ambulatory Visit: Attending: Cardiovascular Disease | Admitting: *Deleted

## 2023-04-08 DIAGNOSIS — Z5181 Encounter for therapeutic drug level monitoring: Secondary | ICD-10-CM

## 2023-04-08 DIAGNOSIS — I10 Essential (primary) hypertension: Secondary | ICD-10-CM | POA: Diagnosis not present

## 2023-04-08 DIAGNOSIS — Z952 Presence of prosthetic heart valve: Secondary | ICD-10-CM | POA: Diagnosis not present

## 2023-04-08 DIAGNOSIS — I4819 Other persistent atrial fibrillation: Secondary | ICD-10-CM | POA: Diagnosis not present

## 2023-04-08 LAB — POCT INR: INR: 4.9 — AB (ref 2.0–3.0)

## 2023-04-08 NOTE — Patient Instructions (Signed)
 Description   Do not take any warfarin today and no warfarin tomorrow then START taking Warfarin 1 tablet daily except 1.5 tablets on Tuesdays and Saturdays. Recheck in 2 weeks (normally 6 weeks).  Stay consistent with greens each week (3 times per week)  Call us # [phone]  Coumadin Clinic with any medication changes or concerns.

## 2023-04-09 LAB — BASIC METABOLIC PANEL
BUN/Creatinine Ratio: 14 (ref 12–28)
BUN: 12 mg/dL (ref 8–27)
CO2: 29 mmol/L (ref 20–29)
Calcium: 9.2 mg/dL (ref 8.7–10.3)
Chloride: 96 mmol/L (ref 96–106)
Creatinine, Ser: 0.86 mg/dL (ref 0.57–1.00)
Glucose: 86 mg/dL (ref 70–99)
Potassium: 4.1 mmol/L (ref 3.5–5.2)
Sodium: 141 mmol/L (ref 134–144)
eGFR: 73 mL/min/{1.73_m2} (ref >59)

## 2023-04-10 ENCOUNTER — Encounter: Payer: Self-pay | Admitting: Cardiovascular Disease

## 2023-04-22 ENCOUNTER — Ambulatory Visit: Attending: Cardiovascular Disease

## 2023-04-22 DIAGNOSIS — Z5181 Encounter for therapeutic drug level monitoring: Secondary | ICD-10-CM | POA: Diagnosis not present

## 2023-04-22 DIAGNOSIS — Z952 Presence of prosthetic heart valve: Secondary | ICD-10-CM

## 2023-04-22 LAB — POCT INR: INR: 5.8 — AB (ref 2.0–3.0)

## 2023-04-22 NOTE — Patient Instructions (Signed)
 Description   Do not take any warfarin today and no warfarin tomorrow then take 1/2 tablet on Sunday, then start taking 1 tablet daily.  Recheck in 10 days -2 weeks (normally 6 weeks). Prefers Fridays since she doesn't drive. Stay consistent with greens each week (3 times per week)  Call us # [phone]  Coumadin Clinic with any medication changes or concerns.

## 2023-05-06 ENCOUNTER — Ambulatory Visit

## 2023-05-15 ENCOUNTER — Other Ambulatory Visit: Payer: Self-pay | Admitting: Cardiovascular Disease

## 2023-05-16 ENCOUNTER — Ambulatory Visit: Attending: Cardiovascular Disease

## 2023-05-16 DIAGNOSIS — Z5181 Encounter for therapeutic drug level monitoring: Secondary | ICD-10-CM

## 2023-05-16 DIAGNOSIS — Z952 Presence of prosthetic heart valve: Secondary | ICD-10-CM | POA: Diagnosis not present

## 2023-05-16 LAB — POCT INR: INR: 2.5 (ref 2.0–3.0)

## 2023-05-16 NOTE — Patient Instructions (Signed)
 Continue taking 1 tablet daily.  Recheck in 4 weeks (normally 6 weeks). Prefers Fridays since she doesn't drive. Stay consistent with greens each week (3 times per week)  Call us  # [phone]  Coumadin  Clinic with any medication changes or concerns.

## 2023-06-01 ENCOUNTER — Other Ambulatory Visit: Payer: Self-pay | Admitting: Cardiovascular Disease

## 2023-06-01 DIAGNOSIS — Z952 Presence of prosthetic heart valve: Secondary | ICD-10-CM

## 2023-06-02 NOTE — Telephone Encounter (Signed)
 Warfarin 5mg  refill Dx-S/P mitral valve replacement  Last INR 05/16/23 Last OV 03/28/23

## 2023-06-17 ENCOUNTER — Encounter

## 2023-06-17 ENCOUNTER — Ambulatory Visit: Attending: Cardiovascular Disease | Admitting: *Deleted

## 2023-06-17 DIAGNOSIS — Z952 Presence of prosthetic heart valve: Secondary | ICD-10-CM | POA: Diagnosis not present

## 2023-06-17 DIAGNOSIS — Z5181 Encounter for therapeutic drug level monitoring: Secondary | ICD-10-CM | POA: Diagnosis not present

## 2023-06-17 LAB — POCT INR: INR: 3.8 — AB (ref 2.0–3.0)

## 2023-06-17 NOTE — Patient Instructions (Signed)
 Description   Do not take any warfarin today then continue taking 1 tablet daily.  Recheck in 4 weeks (normally 6 weeks). Prefers Fridays since she doesn't drive. Stay consistent with greens each week (3 times per week)  Call us  # [phone]  Coumadin  Clinic with any medication changes or concerns.

## 2023-07-11 ENCOUNTER — Other Ambulatory Visit: Payer: Self-pay | Admitting: Cardiovascular Disease

## 2023-07-15 ENCOUNTER — Ambulatory Visit: Attending: Cardiovascular Disease | Admitting: *Deleted

## 2023-07-15 DIAGNOSIS — Z5181 Encounter for therapeutic drug level monitoring: Secondary | ICD-10-CM | POA: Diagnosis not present

## 2023-07-15 DIAGNOSIS — Z952 Presence of prosthetic heart valve: Secondary | ICD-10-CM | POA: Diagnosis not present

## 2023-07-15 LAB — POCT INR: INR: 1.8 — AB (ref 2.0–3.0)

## 2023-07-15 NOTE — Progress Notes (Signed)
Please see anticoagulation encounter.

## 2023-07-15 NOTE — Patient Instructions (Addendum)
  Description   Today take 1.5 tablets of warfarin and tomorrow take 1.5 tablets of warfarin then continue taking 1 tablet daily.  Recheck in 3 weeks (normally 6 weeks). Prefers Fridays since she doesn't drive. Stay consistent with greens each week (3 times per week)  Call us  # [phone]  Coumadin  Clinic with any medication changes or concerns.

## 2023-07-18 ENCOUNTER — Observation Stay (HOSPITAL_COMMUNITY)

## 2023-07-18 ENCOUNTER — Emergency Department (HOSPITAL_COMMUNITY)

## 2023-07-18 ENCOUNTER — Inpatient Hospital Stay (HOSPITAL_COMMUNITY)
Admission: EM | Admit: 2023-07-18 | Discharge: 2023-07-20 | DRG: 100 | Disposition: A | Discharge status: Home / Self Care | Attending: Family Medicine | Admitting: Family Medicine

## 2023-07-18 ENCOUNTER — Other Ambulatory Visit: Payer: Self-pay

## 2023-07-18 DIAGNOSIS — I4819 Other persistent atrial fibrillation: Secondary | ICD-10-CM | POA: Diagnosis not present

## 2023-07-18 DIAGNOSIS — I639 Cerebral infarction, unspecified: Secondary | ICD-10-CM | POA: Diagnosis present

## 2023-07-18 DIAGNOSIS — K219 Gastro-esophageal reflux disease without esophagitis: Secondary | ICD-10-CM | POA: Diagnosis present

## 2023-07-18 DIAGNOSIS — R569 Unspecified convulsions: Principal | ICD-10-CM

## 2023-07-18 DIAGNOSIS — R29708 NIHSS score 8: Secondary | ICD-10-CM | POA: Diagnosis present

## 2023-07-18 DIAGNOSIS — I110 Hypertensive heart disease with heart failure: Secondary | ICD-10-CM | POA: Diagnosis present

## 2023-07-18 DIAGNOSIS — R404 Transient alteration of awareness: Secondary | ICD-10-CM | POA: Diagnosis not present

## 2023-07-18 DIAGNOSIS — Z7901 Long term (current) use of anticoagulants: Secondary | ICD-10-CM

## 2023-07-18 DIAGNOSIS — S299XXA Unspecified injury of thorax, initial encounter: Secondary | ICD-10-CM | POA: Diagnosis not present

## 2023-07-18 DIAGNOSIS — Z8673 Personal history of transient ischemic attack (TIA), and cerebral infarction without residual deficits: Secondary | ICD-10-CM

## 2023-07-18 DIAGNOSIS — S199XXA Unspecified injury of neck, initial encounter: Secondary | ICD-10-CM | POA: Diagnosis not present

## 2023-07-18 DIAGNOSIS — Z880 Allergy status to penicillin: Secondary | ICD-10-CM

## 2023-07-18 DIAGNOSIS — I63422 Cerebral infarction due to embolism of left anterior cerebral artery: Secondary | ICD-10-CM | POA: Diagnosis not present

## 2023-07-18 DIAGNOSIS — G40909 Epilepsy, unspecified, not intractable, without status epilepticus: Principal | ICD-10-CM | POA: Diagnosis present

## 2023-07-18 DIAGNOSIS — M4312 Spondylolisthesis, cervical region: Secondary | ICD-10-CM | POA: Diagnosis not present

## 2023-07-18 DIAGNOSIS — I739 Peripheral vascular disease, unspecified: Secondary | ICD-10-CM | POA: Diagnosis not present

## 2023-07-18 DIAGNOSIS — M160 Bilateral primary osteoarthritis of hip: Secondary | ICD-10-CM | POA: Diagnosis not present

## 2023-07-18 DIAGNOSIS — Z87891 Personal history of nicotine dependence: Secondary | ICD-10-CM

## 2023-07-18 DIAGNOSIS — Z6841 Body Mass Index (BMI) 40.0 and over, adult: Secondary | ICD-10-CM

## 2023-07-18 DIAGNOSIS — I69354 Hemiplegia and hemiparesis following cerebral infarction affecting left non-dominant side: Secondary | ICD-10-CM

## 2023-07-18 DIAGNOSIS — Z79899 Other long term (current) drug therapy: Secondary | ICD-10-CM | POA: Diagnosis not present

## 2023-07-18 DIAGNOSIS — R4781 Slurred speech: Secondary | ICD-10-CM | POA: Diagnosis present

## 2023-07-18 DIAGNOSIS — I6782 Cerebral ischemia: Secondary | ICD-10-CM | POA: Diagnosis not present

## 2023-07-18 DIAGNOSIS — S065XAA Traumatic subdural hemorrhage with loss of consciousness status unknown, initial encounter: Secondary | ICD-10-CM | POA: Insufficient documentation

## 2023-07-18 DIAGNOSIS — I6523 Occlusion and stenosis of bilateral carotid arteries: Secondary | ICD-10-CM | POA: Diagnosis not present

## 2023-07-18 DIAGNOSIS — R9089 Other abnormal findings on diagnostic imaging of central nervous system: Secondary | ICD-10-CM | POA: Diagnosis not present

## 2023-07-18 DIAGNOSIS — R471 Dysarthria and anarthria: Secondary | ICD-10-CM | POA: Diagnosis not present

## 2023-07-18 DIAGNOSIS — W19XXXA Unspecified fall, initial encounter: Secondary | ICD-10-CM | POA: Diagnosis not present

## 2023-07-18 DIAGNOSIS — Z952 Presence of prosthetic heart valve: Secondary | ICD-10-CM

## 2023-07-18 DIAGNOSIS — W1830XA Fall on same level, unspecified, initial encounter: Secondary | ICD-10-CM | POA: Diagnosis present

## 2023-07-18 DIAGNOSIS — Z8249 Family history of ischemic heart disease and other diseases of the circulatory system: Secondary | ICD-10-CM | POA: Diagnosis not present

## 2023-07-18 DIAGNOSIS — I5022 Chronic systolic (congestive) heart failure: Secondary | ICD-10-CM | POA: Diagnosis not present

## 2023-07-18 DIAGNOSIS — R000 Tachycardia, unspecified: Secondary | ICD-10-CM | POA: Diagnosis not present

## 2023-07-18 DIAGNOSIS — I4821 Permanent atrial fibrillation: Secondary | ICD-10-CM | POA: Diagnosis not present

## 2023-07-18 DIAGNOSIS — E66813 Obesity, class 3: Secondary | ICD-10-CM | POA: Diagnosis present

## 2023-07-18 DIAGNOSIS — R29818 Other symptoms and signs involving the nervous system: Secondary | ICD-10-CM | POA: Diagnosis not present

## 2023-07-18 DIAGNOSIS — R29810 Facial weakness: Secondary | ICD-10-CM | POA: Diagnosis present

## 2023-07-18 DIAGNOSIS — M4802 Spinal stenosis, cervical region: Secondary | ICD-10-CM | POA: Diagnosis not present

## 2023-07-18 DIAGNOSIS — R0902 Hypoxemia: Secondary | ICD-10-CM | POA: Diagnosis not present

## 2023-07-18 DIAGNOSIS — I63511 Cerebral infarction due to unspecified occlusion or stenosis of right middle cerebral artery: Secondary | ICD-10-CM | POA: Diagnosis not present

## 2023-07-18 DIAGNOSIS — I10 Essential (primary) hypertension: Secondary | ICD-10-CM | POA: Diagnosis not present

## 2023-07-18 DIAGNOSIS — I4891 Unspecified atrial fibrillation: Secondary | ICD-10-CM | POA: Diagnosis not present

## 2023-07-18 DIAGNOSIS — I6389 Other cerebral infarction: Secondary | ICD-10-CM | POA: Diagnosis not present

## 2023-07-18 DIAGNOSIS — M47812 Spondylosis without myelopathy or radiculopathy, cervical region: Secondary | ICD-10-CM | POA: Diagnosis not present

## 2023-07-18 DIAGNOSIS — S065X0A Traumatic subdural hemorrhage without loss of consciousness, initial encounter: Secondary | ICD-10-CM | POA: Diagnosis not present

## 2023-07-18 DIAGNOSIS — I700 Atherosclerosis of aorta: Secondary | ICD-10-CM | POA: Diagnosis not present

## 2023-07-18 DIAGNOSIS — Z823 Family history of stroke: Secondary | ICD-10-CM

## 2023-07-18 DIAGNOSIS — I6200 Nontraumatic subdural hemorrhage, unspecified: Secondary | ICD-10-CM | POA: Diagnosis not present

## 2023-07-18 DIAGNOSIS — G8384 Todd's paralysis (postepileptic): Secondary | ICD-10-CM | POA: Diagnosis present

## 2023-07-18 DIAGNOSIS — S0003XA Contusion of scalp, initial encounter: Secondary | ICD-10-CM | POA: Diagnosis not present

## 2023-07-18 LAB — CBC WITH DIFFERENTIAL/PLATELET
Abs Immature Granulocytes: 0.02 10*3/uL (ref 0.00–0.07)
Basophils Absolute: 0 10*3/uL (ref 0.0–0.1)
Basophils Relative: 0 %
Eosinophils Absolute: 0.1 10*3/uL (ref 0.0–0.5)
Eosinophils Relative: 1 %
HCT: 38.4 % (ref 36.0–46.0)
Hemoglobin: 12.1 g/dL (ref 12.0–15.0)
Immature Granulocytes: 0 %
Lymphocytes Relative: 15 %
Lymphs Abs: 1.2 10*3/uL (ref 0.7–4.0)
MCH: 30.7 pg (ref 26.0–34.0)
MCHC: 31.5 g/dL (ref 30.0–36.0)
MCV: 97.5 fL (ref 80.0–100.0)
Monocytes Absolute: 0.6 10*3/uL (ref 0.1–1.0)
Monocytes Relative: 8 %
Neutro Abs: 6 10*3/uL (ref 1.7–7.7)
Neutrophils Relative %: 76 %
Platelets: 228 10*3/uL (ref 150–400)
RBC: 3.94 MIL/uL (ref 3.87–5.11)
RDW: 13.1 % (ref 11.5–15.5)
WBC: 8 10*3/uL (ref 4.0–10.5)
nRBC: 0 % (ref 0.0–0.2)

## 2023-07-18 LAB — RAPID URINE DRUG SCREEN, HOSP PERFORMED
Amphetamines: NOT DETECTED
Barbiturates: NOT DETECTED
Benzodiazepines: NOT DETECTED
Cocaine: NOT DETECTED
Opiates: NOT DETECTED
Tetrahydrocannabinol: NOT DETECTED

## 2023-07-18 LAB — PROTIME-INR
INR: 1.9 — ABNORMAL HIGH (ref 0.8–1.2)
Prothrombin Time: 22 s — ABNORMAL HIGH (ref 11.4–15.2)

## 2023-07-18 LAB — COMPREHENSIVE METABOLIC PANEL WITH GFR
ALT: 25 U/L (ref 0–44)
AST: 41 U/L (ref 15–41)
Albumin: 4.2 g/dL (ref 3.5–5.0)
Alkaline Phosphatase: 102 U/L (ref 38–126)
Anion gap: 17 — ABNORMAL HIGH (ref 5–15)
BUN: 17 mg/dL (ref 8–23)
CO2: 21 mmol/L — ABNORMAL LOW (ref 22–32)
Calcium: 9.3 mg/dL (ref 8.9–10.3)
Chloride: 99 mmol/L (ref 98–111)
Creatinine, Ser: 1.14 mg/dL — ABNORMAL HIGH (ref 0.44–1.00)
GFR, Estimated: 52 mL/min — ABNORMAL LOW (ref >60)
Glucose, Bld: 171 mg/dL — ABNORMAL HIGH (ref 70–99)
Potassium: 3.7 mmol/L (ref 3.5–5.1)
Sodium: 137 mmol/L (ref 135–145)
Total Bilirubin: 1 mg/dL (ref 0.0–1.2)
Total Protein: 7.7 g/dL (ref 6.5–8.1)

## 2023-07-18 LAB — I-STAT CHEM 8, ED
BUN: 19 mg/dL (ref 8–23)
Calcium, Ion: 1.09 mmol/L — ABNORMAL LOW (ref 1.15–1.40)
Chloride: 101 mmol/L (ref 98–111)
Creatinine, Ser: 1.1 mg/dL — ABNORMAL HIGH (ref 0.44–1.00)
Glucose, Bld: 164 mg/dL — ABNORMAL HIGH (ref 70–99)
HCT: 39 % (ref 36.0–46.0)
Hemoglobin: 13.3 g/dL (ref 12.0–15.0)
Potassium: 3.6 mmol/L (ref 3.5–5.1)
Sodium: 138 mmol/L (ref 135–145)
TCO2: 26 mmol/L (ref 22–32)

## 2023-07-18 LAB — TYPE AND SCREEN
ABO/RH(D): AB POS
Antibody Screen: NEGATIVE

## 2023-07-18 LAB — ETHANOL: Alcohol, Ethyl (B): <15 mg/dL (ref <15)

## 2023-07-18 LAB — APTT: aPTT: 29 s (ref 24–36)

## 2023-07-18 MED ORDER — LORAZEPAM 2 MG/ML IJ SOLN
1.0000 mg | Freq: Once | INTRAMUSCULAR | Status: AC
  Administered 2023-07-18: 1 mg via INTRAVENOUS

## 2023-07-18 MED ORDER — HEPARIN (PORCINE) 25000 UT/250ML-% IV SOLN
1000.0000 [IU]/h | INTRAVENOUS | Status: AC
  Administered 2023-07-18: 800 [IU]/h via INTRAVENOUS
  Filled 2023-07-18: qty 250

## 2023-07-18 MED ORDER — LEVETIRACETAM 500 MG PO TABS
500.0000 mg | ORAL_TABLET | Freq: Two times a day (BID) | ORAL | Status: DC
  Administered 2023-07-18 – 2023-07-20 (×4): 500 mg via ORAL
  Filled 2023-07-18 (×4): qty 1

## 2023-07-18 MED ORDER — ACETAMINOPHEN 160 MG/5ML PO SOLN: 650.0000 mg | ORAL | Status: DC | PRN

## 2023-07-18 MED ORDER — ATORVASTATIN CALCIUM 10 MG PO TABS
20.0000 mg | ORAL_TABLET | Freq: Every day | ORAL | Status: DC
  Administered 2023-07-19 – 2023-07-20 (×2): 20 mg via ORAL
  Filled 2023-07-18 (×2): qty 2

## 2023-07-18 MED ORDER — LORAZEPAM 2 MG/ML IJ SOLN
INTRAMUSCULAR | Status: AC
Start: 2023-07-18 — End: 2023-07-18
  Filled 2023-07-18: qty 1

## 2023-07-18 MED ORDER — SPIRONOLACTONE 25 MG PO TABS
25.0000 mg | ORAL_TABLET | Freq: Every day | ORAL | Status: DC
  Administered 2023-07-19 – 2023-07-20 (×2): 25 mg via ORAL
  Filled 2023-07-18 (×2): qty 1

## 2023-07-18 MED ORDER — SENNOSIDES-DOCUSATE SODIUM 8.6-50 MG PO TABS: 1.0000 | ORAL_TABLET | Freq: Every evening | ORAL | Status: DC | PRN

## 2023-07-18 MED ORDER — IOHEXOL 350 MG/ML SOLN
75.0000 mL | Freq: Once | INTRAVENOUS | Status: AC | PRN
  Administered 2023-07-18: 75 mL via INTRAVENOUS

## 2023-07-18 MED ORDER — METOPROLOL TARTRATE 50 MG PO TABS
50.0000 mg | ORAL_TABLET | Freq: Every morning | ORAL | Status: DC
  Administered 2023-07-19 – 2023-07-20 (×2): 50 mg via ORAL
  Filled 2023-07-18 (×2): qty 1

## 2023-07-18 MED ORDER — ACETAMINOPHEN 325 MG PO TABS
650.0000 mg | ORAL_TABLET | ORAL | Status: DC | PRN
  Administered 2023-07-19: 650 mg via ORAL
  Filled 2023-07-18: qty 2

## 2023-07-18 MED ORDER — ACETAMINOPHEN 650 MG RE SUPP: 650.0000 mg | RECTAL | Status: DC | PRN

## 2023-07-18 MED ORDER — HEPARIN (PORCINE) 25000 UT/250ML-% IV SOLN: 1100.0000 [IU]/h | INTRAVENOUS | Status: DC

## 2023-07-18 MED ORDER — LEVETIRACETAM (KEPPRA) 500 MG/5 ML ADULT IV PUSH
3000.0000 mg | INTRAVENOUS | Status: AC
  Administered 2023-07-18: 3000 mg via INTRAVENOUS
  Filled 2023-07-18: qty 30

## 2023-07-18 MED ORDER — PANTOPRAZOLE SODIUM 40 MG PO TBEC
40.0000 mg | DELAYED_RELEASE_TABLET | Freq: Every day | ORAL | Status: DC
  Administered 2023-07-19 – 2023-07-20 (×2): 40 mg via ORAL
  Filled 2023-07-18 (×2): qty 1

## 2023-07-18 MED ORDER — SODIUM CHLORIDE 0.9 % IV BOLUS
10.0000 mL/kg | Freq: Once | INTRAVENOUS | Status: AC
  Administered 2023-07-18: 962 mL via INTRAVENOUS

## 2023-07-18 MED ORDER — STROKE: EARLY STAGES OF RECOVERY BOOK
Freq: Once | Status: AC
  Filled 2023-07-18: qty 1

## 2023-07-18 NOTE — Progress Notes (Signed)
STAT EEG complete - results pending. ? ?

## 2023-07-18 NOTE — ED Notes (Signed)
 Per daughter on phone, patient fell when getting up to go to bathroom. Pt is normally alert and oriented with perfect speech. On arrival to hospital, pt speech slurred and confused. At 1044 pt became nonverbal and unable to communicate. This RN noted right facial droop. Per PA at bedside, patient will be activated as Level 2 Trauma

## 2023-07-18 NOTE — H&P (Signed)
 History and Physical   Natasha Davis:[id number] DOB: 01-02-1956 DOA: 07/18/2023  PCP: Marvine Rush, MD   Patient coming from: Home  Chief Complaint: Seizure-like activity  HPI: Natasha Davis is a 68 y.o. female with medical history significant of hypertension, atrial fibrillation, GERD, mitral stenosis status post mechanical mitral valve, seizure disorder, CVA presenting after seizure-like episode at home.  Patient was in the kitchen cooking and suddenly fell to the floor and had 1 minute of seizure-like activity.  Did hit her head during the fall.  Was weak and confused after this episode consistent with postictal state.  In the ED did have initial difficulty speaking and weakness.  Patient has a fairly remote history of seizure but is on Keppra , last seizure was greater than 10 years ago.  Patient did have subtherapeutic INR of 1.8 at last check on 6/20.  No reported fevers, chills, chest pain, shortness of breath, abdominal pain, constipation, diarrhea, nausea, vomiting.  Just prior to my evaluation of patient she was having trouble managing her secretion and staff were having difficulty suctioning them. Family feels as though tongue may be larger than normal.  EDP had reevaluated the patient and sent for stat CT head to make sure no change from previous.  Chest x-ray also ordered to rule out any aspiration.  ED Course: Vital signs in the ED notable for blood pressure in the 140s systolic, respiratory rate in the 20s.  Lab workup included CMP with bicarb 21, gap 17, creatinine 1.14 near baseline of 0.8-0.9, glucose 171.  CBC with normal limits.  PT and INR 22 and 1.9 respectively.  PTT normal.  Type and screen performed.  Alcohol level negative.  UDS pending.  Imaging included chest x-ray which showed mild dilated pulmonary vasculature cannot exclude pulmonary venous hypertension.  Pelvis x-ray showed no acute abnormality.  CT head showed no acute abnormality but did show known  chronic changes.  CT C-spine showed no acute abnormality but did showed no chronic changes.  CTA of the head and neck showed no evidence of intracranial LVO or high-grade stenosis.  Did show bilateral vascular protrusions of the carotid arteries which could represent aneurysm versus other.  MRI brain was positive for acute versus subacute MRI and ACE suspicion for a small/thin subdural hematoma.  Neurology was consulted recommended seizure precautions and Keppra  for potential breakthrough seizure, stroke workup with some limitations for repeat CVA, holding warfarin in favor of heparin for mechanical valve while getting repeat CT head to monitor subdural hematoma.  Patient received Ativan, Keppra , 1 L IV fluids in the ED.  Review of Systems: As per HPI otherwise all other systems reviewed and are negative.  Past Medical History:  Diagnosis Date   Chronic anticoagulation    Claudication [facility])    History of CVA (cerebrovascular accident)    Hypertension    Mitral stenosis    Seizure (HCC)    HX    Past Surgical History:  Procedure Laterality Date   CARDIAC CATHETERIZATION  06/01/96   NORMAL LEFT VENTRICULAR SYSTOLIC FUNCTION. EF 60%   CESAREAN SECTION     X2   MITRAL VALVE REPLACEMENT      Social History  reports that she quit smoking about 17 years ago. Her smoking use included cigarettes. She has never used smokeless tobacco. She reports that she does not currently use alcohol. She reports that she does not currently use drugs.  Allergies  Allergen Reactions   Penicillins    Penicillins Other (See  Comments)    Couldn't walk    Family History  Problem Relation Age of Onset   Hypertension Mother    Heart disease Father    Hypertension Father    Heart attack Father    Stroke Brother   Reviewed on admission  Prior to Admission medications   Medication Sig Start Date End Date Taking? Authorizing Provider  acetaminophen  (TYLENOL ) 650 MG CR tablet Take 650 mg by mouth every 8  (eight) hours as needed. Pain    [provider]  atorvastatin  (LIPITOR) 20 MG tablet TAKE 1 TABLET BY MOUTH EVERY DAY 11/03/22   Nahser, Aleene PARAS, MD  chlorhexidine  (PERIDEX ) 0.12 % solution Use as directed 15 mLs in the mouth or throat 2 (two) times daily. 01/18/22   Jerral Meth, MD  Homeopathic Products (EARACHE DROPS OT) Place 1 drop into both ears 2 (two) times daily as needed (pain).    [provider]  levETIRAcetam  (KEPPRA ) 500 MG tablet Take 500 mg by mouth in the morning.    [provider]  metoprolol  tartrate (LOPRESSOR ) 50 MG tablet TAKE 1 TABLET BY MOUTH IN THE  MORNING 05/17/23   Nahser, Aleene PARAS, MD  Multiple Vitamins-Minerals (CENTRUM SILVER ADULT 50+) TABS Take 1 tablet by mouth in the morning.    [provider]  omeprazole  (PRILOSEC) 20 MG capsule TAKE 2 CAPSULES BY MOUTH EVERY DAY 11/11/22   Nahser, Aleene PARAS, MD  potassium chloride  (KLOR-CON ) 10 MEQ tablet Take 2 tablets (20 mEq total) by mouth 2 (two) times daily. 02/01/23   Nahser, Aleene PARAS, MD  sodium chloride (OCEAN) 0.65 % SOLN nasal spray Place 1 spray into both nostrils 2 (two) times daily as needed for congestion.    [provider]  spironolactone  (ALDACTONE ) 25 MG tablet Take 1 tablet (25 mg total) by mouth daily. 03/28/23   Nahser, Aleene PARAS, MD  torsemide  (DEMADEX ) 20 MG tablet Take 1 tablet (20 mg total) by mouth daily. 03/30/23   Nahser, Aleene PARAS, MD  warfarin (COUMADIN ) 5 MG tablet TAKE 1 TABLET BY MOUTH DAILY OR AS DIRECTED BY COUMADIN  CLINIC 06/02/23   Nahser, Aleene PARAS, MD    Physical Exam: Vitals:   07/18/23 1029 07/18/23 1031 07/18/23 1045  BP:  (!) 148/67 (!) 148/67  Pulse:  96 96  Resp:  (!) 22 (!) 22  TempSrc:  Oral   SpO2: 98% 99% 99%  Weight:  96.2 kg   Height:  4' 11 (1.499 m)     Physical Exam Constitutional:      General: She is not in acute distress.    Appearance: Normal appearance. She is obese.  HENT:     Head: Normocephalic and atraumatic.      Mouth/Throat:     Mouth: Mucous membranes are moist.     Pharynx: Oropharynx is clear.   Eyes:     Extraocular Movements: Extraocular movements intact.     Pupils: Pupils are equal, round, and reactive to light.    Cardiovascular:     Rate and Rhythm: Normal rate and regular rhythm.     Pulses: Normal pulses.     Heart sounds: Normal heart sounds.  Pulmonary:     Effort: Pulmonary effort is normal. No respiratory distress.     Breath sounds: Normal breath sounds.  Abdominal:     General: Bowel sounds are normal. There is no distension.     Palpations: Abdomen is soft.     Tenderness: There is no abdominal tenderness.  Musculoskeletal:        General: No swelling or deformity.   Skin:    General: Skin is warm and dry.   Neurological:     Comments: Mental Status: Patient is awake, alert, oriented to person, place, year Abnormal sounding speech. No neglect. Cranial Nerves: II: Pupils equal, round, and reactive to light.   III,IV, VI: EOMI without ptosis or diploplia.  V: Facial sensation is symmetric to light touch (chronically reduce on left face). VII: Facial movement is symmetric.  VIII: hearing is intact to voice X: Uvula elevates symmetrically XI: Shoulder shrug is symmetric. XII: tongue is midline without atrophy or fasciculations.  Motor: Good effort thorughout, at Least 5/5 bilateral UE, 5/5 bilateral lower extremitiy  Sensory: Sensation is grossly intact bilateral UEs & LEs Cerebellar: Finger-Nose intact bilat    Labs on Admission: I have personally reviewed following labs and imaging studies  CBC: Recent Labs  Lab 07/18/23 1047 07/18/23 1215  WBC 8.0  --   NEUTROABS 6.0  --   HGB 12.1 13.3  HCT 38.4 39.0  MCV 97.5  --   PLT 228  --     Basic Metabolic Panel: Recent Labs  Lab 07/18/23 1047 07/18/23 1215  NA 137 138  K 3.7 3.6  CL 99 101  CO2 21*  --   GLUCOSE 171* 164*  BUN 17 19  CREATININE 1.14* 1.10*  CALCIUM  9.3  --      GFR: Estimated Creatinine Clearance: 49.1 mL/min (A) (by C-G formula based on SCr of 1.1 mg/dL (H)).  Liver Function Tests: Recent Labs  Lab 07/18/23 1047  AST 41  ALT 25  ALKPHOS 102  BILITOT 1.0  PROT 7.7  ALBUMIN 4.2    Urine analysis:    Component Value Date/Time   COLORURINE YELLOW 01/02/2023 1812   APPEARANCEUR CLEAR 01/02/2023 1812   LABSPEC 1.009 01/02/2023 1812   PHURINE 6.5 01/02/2023 1812   GLUCOSEU NEGATIVE 01/02/2023 1812   HGBUR NEGATIVE 01/02/2023 1812   BILIRUBINUR NEGATIVE 01/02/2023 1812   KETONESUR NEGATIVE 01/02/2023 1812   PROTEINUR 30 (A) 01/02/2023 1812   UROBILINOGEN 0.2 09/04/2010 1617   NITRITE NEGATIVE 01/02/2023 1812   LEUKOCYTESUR MODERATE (A) 01/02/2023 1812    Radiological Exams on Admission: DG Chest Port 1 View Result Date: 07/18/2023 CLINICAL DATA:  Seizure, trauma EXAM: PORTABLE CHEST 1 VIEW COMPARISON:  01/02/2023 FINDINGS: Mitral valve prosthesis and prior median sternotomy. Atherosclerotic calcification of the aortic arch. Moderate enlargement of the cardiopericardial silhouette, similar to prior. Mildly indistinct pulmonary vasculature, cannot exclude pulmonary venous hypertension. No overt edema or blunting of the costophrenic angles. No airspace opacity identified. No significant bony findings. IMPRESSION: 1. Moderate enlargement of the cardiopericardial silhouette, similar to prior. 2. Mildly indistinct pulmonary vasculature, cannot exclude pulmonary venous hypertension. 3. Mitral valve prosthesis and prior median sternotomy. Electronically Signed   By: Ryan Salvage M.D.   On: 07/18/2023 13:05   DG Pelvis Portable Result Date: 07/18/2023 CLINICAL DATA:  Seizure.  Fall. EXAM: PORTABLE PELVIS 1-2 VIEWS COMPARISON:  None Available. FINDINGS: Contrast medium noted in the urinary bladder. Moderate degenerative chondral thinning in both hips with mild associated spurring. Overall moderate degenerative hip arthropathy. No SI joint  widening. No fracture or acute bony findings identified. IMPRESSION: 1. Moderate degenerative hip arthropathy. No acute bony findings. Electronically Signed   By: Ryan Salvage M.D.   On: 07/18/2023 13:04   MR BRAIN WO CONTRAST Addendum Date: 07/18/2023 ADDENDUM REPORT: 07/18/2023 12:54 ADDENDUM: Impressions #2 and #  3 called by telephone at the time of interpretation on 07/18/2023 at 12:54 pm to provider [facility] , who verbally acknowledged these results. Electronically Signed   By: Rockey Childs D.O.   On: 07/18/2023 12:54   Result Date: 07/18/2023 CLINICAL DATA:  Neuro deficit, acute, stroke suspected. Seizure. Fall. EXAM: MRI HEAD WITHOUT CONTRAST TECHNIQUE: Multiplanar, multiecho pulse sequences of the brain and surrounding structures were obtained without intravenous contrast. COMPARISON:  Non-contrast head CT and CT angiogram head/neck performed earlier today 07/18/2023. FINDINGS: Intermittently motion degraded examination (with up to moderate motion degradation of the acquired sequences). Brain: 5 mm focus of cortical restricted diffusion within the left parietal lobe compatible with an acute/subacute infarct (series 5, image 79) (series 6, image 31). Mild smooth dural thickening versus thin subdural hematoma along the right frontoparietal convexity, measuring up to 2 mm in thickness (for instance as seen on series 9, image 34). No mass effect upon the underlying brain parenchyma. No midline shift. Large chronic cortical/subcortical right MCA territory infarct again demonstrated (affecting the frontal lobe/insula, temporal lobe and parietal lobe). Mild-to-moderate multifocal T2 FLAIR hyperintense signal abnormality elsewhere within the cerebral white matter, nonspecific but compatible chronic small vessel ischemic disease. Few nonspecific punctate chronic microhemorrhages scattered within the supratentorial brain. Chronic left thalamic lacunar infarct (series 10, image 13). No evidence of an  intracranial mass. Vascular: Maintained flow voids within the proximal large arterial vessels. Skull and upper cervical spine: No focal worrisome marrow lesion. Sinuses/Orbits: No mass or acute finding within the imaged orbits. No significant paranasal sinus disease. Other: Right parietal scalp hematoma. Attempts are being made to reach the ordering provider at this time. IMPRESSION: 1. Intermittently motion degraded exam. 2. 5 mm acute/subacute cortical infarct within the left parietal lobe. 3. Possible thin subdural hematoma along the right frontoparietal convexity (versus dural thickening). No associated mass effect. 4. Known large chronic right MCA territory infarct. 5. Background mild-to-moderate cerebral white matter chronic small vessel ischemic disease. 6. Chronic left thalamic lacunar infarct. 7. Right parietal scalp hematoma. Electronically Signed: By: Rockey Childs D.O. On: 07/18/2023 12:46   CT Cervical Spine Wo Contrast Result Date: 07/18/2023 CLINICAL DATA:  Neck trauma, dangerous injury mechanism (Age 74-64y) EXAM: CT CERVICAL SPINE WITHOUT CONTRAST TECHNIQUE: Multidetector CT imaging of the cervical spine was performed without intravenous contrast. Multiplanar CT image reconstructions were also generated. RADIATION DOSE REDUCTION: This exam was performed according to the departmental dose-optimization program which includes automated exposure control, adjustment of the mA and/or kV according to patient size and/or use of iterative reconstruction technique. COMPARISON:  None Available. FINDINGS: Alignment: Slight degenerative anterolisthesis at C4-5. Slight reversal of the normal cervical lordosis. Skull base and vertebrae: Intact. No osseous lesions are present. There is a prominent vascular foramen present on the left at C4. Soft tissues and spinal canal: No hematoma or significant soft tissue swelling present. Disc levels: Endplate ridging with mild central spinal canal stenosis and mild  bilateral neural foraminal stenosis at C5-6. There is also mild central spinal canal stenosis and bilateral neural foraminal stenosis at C6-7. There are moderate facet hypertrophic changes on the right at C2-3. Upper chest: Unremarkable. Other: None. IMPRESSION: 1. Mild-to-moderate cervical spondylosis without evidence of acute traumatic injury. Electronically Signed   By: Evalene Coho M.D.   On: 07/18/2023 11:37   CT ANGIO HEAD NECK W WO CM (CODE STROKE) Result Date: 07/18/2023 CLINICAL DATA:  Provided history: Neuro deficit, acute, stroke suspected. EXAM: CT ANGIOGRAPHY HEAD AND NECK WITH AND WITHOUT  CONTRAST TECHNIQUE: Multidetector CT imaging of the head and neck was performed using the standard protocol during bolus administration of intravenous contrast. Multiplanar CT image reconstructions and MIPs were obtained to evaluate the vascular anatomy. Carotid stenosis measurements (when applicable) are obtained utilizing NASCET criteria, using the distal internal carotid diameter as the denominator. RADIATION DOSE REDUCTION: This exam was performed according to the departmental dose-optimization program which includes automated exposure control, adjustment of the mA and/or kV according to patient size and/or use of iterative reconstruction technique. CONTRAST:  75mL OMNIPAQUE  IOHEXOL  350 MG/ML SOLN COMPARISON:  Head CT 07/18/2023 noncontrast head CT performed earlier today 07/18/2023. FINDINGS: CTA NECK FINDINGS Aortic arch: Standard aortic branching. Atherosclerotic plaque within the visualized aortic arch and proximal major branch vessels of the neck. No hemodynamically significant innominate or proximal subclavian artery stenosis. Right carotid system: CCA and ICA patent within the neck without stenosis or significant atherosclerotic disease. Left carotid system: CCA and ICA patent within the neck without stenosis. Mild nonstenotic atherosclerotic plaque at the CCA origin. Vertebral arteries: Codominant  and patent within the neck without stenosis or significant atherosclerotic disease. Skeleton: Cervical spine findings separately reported on same day cervical spine CT. The patient is edentulous. Other neck: No neck mass or cervical lymphadenopathy. Upper chest: No consolidation within the imaged lung apices. Prior median sternotomy. Review of the MIP images confirms the above findings CTA HEAD FINDINGS Anterior circulation: The intracranial internal carotid arteries are patent. Atherosclerotic plaque within both vessels with no more than mild stenosis. The M1 middle cerebral arteries are patent. No M2 proximal branch occlusion or high-grade proximal stenosis. 1-2 mm posterolaterally projecting vascular protrusion arising from the cavernous right internal carotid artery, which may reflect an aneurysm or the origin of an otherwise poorly delineated branch vessel (series 9, image 215). 1-2 mm inferiorly projecting vascular protrusion arising from the paraclinoid left internal carotid artery, which may reflect an aneurysm or infundibulum (series 9, images 226 and 227). Posterior circulation: The intracranial vertebral arteries are patent. The basilar artery is patent. The posterior cerebral arteries are patent. Posterior communicating arteries are diminutive or absent, bilaterally. Venous sinuses: Within the limitations of contrast timing, no convincing thrombus. Anatomic variants: As described. Review of the MIP images confirms the above findings No emergent large vessel occlusion identified. These results were communicated to Dr. Voncile at 11:32 amon 6/23/2025by text page via the [facility] messaging system. IMPRESSION: CTA neck: 1. The common carotid and internal carotid arteries are patent within the neck. Non-stenotic atherosclerotic plaque at the left common carotid artery origin. 2. Vertebral arteries patent within the neck without stenosis or significant atherosclerotic disease. 3. Aortic Atherosclerosis  (ICD10-I70.0). 4. Cervical spine findings separately reported on same day cervical spine CT. CTA head: 1. No proximal intracranial large vessel occlusion or high-grade proximal arterial stenosis identified. 2. Atherosclerotic plaque within the intracranial internal carotid arteries with no more than mild stenosis. 3. 1-2 mm vascular protrusion arising from the cavernous right internal carotid artery, which may reflect an aneurysm or the origin of an otherwise poorly delineated branch vessel. 4. 1-2 mm vascular protrusion arising from the paraclinoid left internal carotid artery, which may reflect an aneurysm or infundibulum. Electronically Signed   By: Rockey Childs D.O.   On: 07/18/2023 11:34   CT HEAD CODE STROKE WO CONTRAST Result Date: 07/18/2023 CLINICAL DATA:  Code stroke. Neuro deficit, acute, stroke suspected. EXAM: CT HEAD WITHOUT CONTRAST TECHNIQUE: Contiguous axial images were obtained from the base of the skull through the vertex  without intravenous contrast. RADIATION DOSE REDUCTION: This exam was performed according to the departmental dose-optimization program which includes automated exposure control, adjustment of the mA and/or kV according to patient size and/or use of iterative reconstruction technique. COMPARISON:  Head CT 09/04/2010. FINDINGS: Brain: Large chronic right MCA territory cortical/subcortical infarct affecting the frontal, temporal and parietal lobes, not appreciably changed from the prior head CT of 09/04/2010. Background mild patchy and ill-defined hypoattenuation within the cerebral white matter, nonspecific but compatible chronic small vessel ischemic disease. Chronic lacunar infarct within the left thalamus, unchanged. There is no acute intracranial hemorrhage. No acute demarcated cortical infarct. No extra-axial fluid collection. No evidence of an intracranial mass. No midline shift. Vascular: No hyperdense vessel.  Atherosclerotic calcifications. Skull: No calvarial fracture  or aggressive osseous lesion. Sinuses/Orbits: No mass or acute finding within the imaged orbits. No significant paranasal sinus disease at the imaged levels. ASPECTS [facility] Stroke Program Early CT Score) - Ganglionic level infarction (caudate, lentiform nuclei, internal capsule, insula, M1-M3 cortex): 7 - Supraganglionic infarction (M4-M6 cortex): 3 Total score (0-10 with 10 being normal): 10 (when discounting a chronic right MCA territory infarct). No evidence of an acute intracranial abnormality. These results were communicated to Dr. Voncile at 11:15 amon 6/23/2025by text page via the [facility] messaging system. IMPRESSION: 1. No evidence of an acute intracranial abnormality. 2. Known large chronic right MCA territory infarct. 3. Mild background cerebral white matter chronic small vessel ischemic disease, progressed from the prior head CT of 09/04/2010. 4. Chronic left thalamic lacunar infarct, unchanged. Electronically Signed   By: Rockey Childs D.O.   On: 07/18/2023 11:15   EKG: Independently reviewed.  Atrial fibrillation at 90 beats minute.  Nonspecific T wave changes.  PVC noted.  QTc borderline at 486.  Assessment/Plan Principal Problem:   Acute CVA (cerebrovascular accident) (HCC) Active Problems:   History of mitral valve replacement with mechanical valve   HTN (hypertension)   GERD (gastroesophageal reflux disease)   Persistent atrial fibrillation (HCC)   Subdural hematoma (HCC)   Seizure (HCC)   Seizure disorder (HCC)   History of CVA (cerebrovascular accident)   Acute CVA (Vs Subacute) > Patient found to have acute versus subacute CVA on MRI.  CT head showed no acute abnormality.  CTA head and neck showed no LVO nor high-grade stenosis but did show bilateral carotid vascular protrusions possibly representing aneurysm versus other. > Neurology consulting and are following, initial recommendations as per ED course above.  Thought is that this is a likely embolic stroke.  Current INR is  1.9. - Appreciate neurology recommendations and assistance - Allow for mild permissive HTN in the setting of acute versus subacute CVA with suspected new subdural hematoma (systolic goal: 130-150) - Continuing on heparin and likely transition back to warfarin pending repeat CT head as below - Continue Home statin - Echocardiogram to reevaluate valve - A1C  - Lipid panel  - Tele monitoring  - SLP eval - PT/OT  Breakthrough seizure Seizure disorder > Distant history of seizure disorder.  Last seizure was greater than 10 years ago.  Had episode today in the kitchen with seizure-like activity and postictal state. > Received Keppra  IV in the ED and recommendation is for seizure precautions and twice daily Keppra . - Appreciate neurology recommendations and assistance - Continue with Keppra  twice daily - Seizure precautions ADDENDUM > Patient with some issues handling secretions, will hold off on diet until this is addressed.  Repeat CT head showed no obvious enlarging hematoma but  will await formal read.  Chest x-ray pending.  Possibility that this is due to atypical seizures. - Follow-up CT head reviewed - EEG  Subdural hematoma > Incidentally noted to have suspected thin/small subdural hematoma on MRI brain. > Did have a fall with associated seizure activity as above where she did hit her head. > Hematoma was not noted on initial CT so we will monitor closely with repeat CT 6 hours after MRI. > Neurology recommending to hold warfarin in favor of heparin while monitoring this hematoma but to keep patient anticoagulated in the setting of mechanical mitral valve. - Repeat CT head in 6 hours - Heparin per pharmacy - Holding warfarin ADDENDUM > Patient with some issues handling secretions, will hold off on diet until this is addressed.  Repeat CT head showed no obvious enlarging hematoma but will await formal read.  Chest x-ray pending.  Possibility that this is due to atypical seizures. -  Follow-up CT head reviewed - EEG  Hypertension - Goal of 130-150 as above - 140s-150s in the ED - Continue home metoprolol , spironolactone  - Hold torsemide  for now  Mitral stenosis Status post mechanical mitral valve -Warfarin being held for now in the setting of above.  Currently mildly subtherapeutic at 1.9. - Heparin per pharmacy for now as above  Atrial fibrillation - Continue home metoprolol  - Heparin as above for now  GERD - Continue PPI  DVT prophylaxis: Heparin Code Status:   Full Family Communication:  Updated at bedside  Disposition Plan:   Patient is from:  Home  Anticipated DC to:  Home  Anticipated DC date:  1 to 3 days  Anticipated DC barriers: None  Consults called:  Neurology Admission status:  Observation, telemetry  Severity of Illness: The appropriate patient status for this patient is OBSERVATION. Observation status is judged to be reasonable and necessary in order to provide the required intensity of service to ensure the patient's safety. The patient's presenting symptoms, physical exam findings, and initial radiographic and laboratory data in the context of their medical condition is felt to place them at decreased risk for further clinical deterioration. Furthermore, it is anticipated that the patient will be medically stable for discharge from the hospital within 2 midnights of admission.    Marsa KATHEE Scurry MD [facility]  How to contact the [facility] Attending or Consulting provider 7A - 7P or covering provider during after hours 7P -7A, for this patient?   Check the care team in [facility] and look for a) attending/consulting [facility] provider listed and b) the [facility] team listed Log into [email or web address] and use [place]'s universal password to access. If you do not have the password, please contact the hospital operator. Locate the [facility] provider you are looking for under [facility] and page to a number that you can be directly reached. If you still  have difficulty reaching the provider, please page the [facility] (Director on Call) for the Hospitalists listed on amion for assistance.  07/18/2023, 1:37 PM

## 2023-07-18 NOTE — ED Notes (Signed)
 PT taken to CT 2

## 2023-07-18 NOTE — ED Triage Notes (Signed)
 Pt BIB EMS for seizure this morning, last seizure 12 years ago, on Keppra . Has not taken Keppra  today but usually compliant with meds. Witnessed by granddaughter, fell to the ground and seizure activity for less than a minute. Post ictal with EMS. On arrival started becoming more lucid. Stroke 30 years ago.

## 2023-07-18 NOTE — ED Notes (Signed)
 Pt found to be without speech again and drooling. Pt suctioned and tongue swelling noticed by this paramedic. EDPA and admitting MD notified. EDPA and MD Belfi came to bedside. Pt taken by this paramedic to CT.

## 2023-07-18 NOTE — ED Notes (Signed)
 X-Ray, trauma RN and stoke RN at bedside

## 2023-07-18 NOTE — Progress Notes (Signed)
   07/18/23 1045  Spiritual Encounters  Type of Visit Initial  Care provided to: Family  Referral source Trauma page  Reason for visit Trauma  OnCall Visit No  Interventions  Spiritual Care Interventions Made Established relationship of care and support;Compassionate presence;Prayer  Intervention Outcomes  Outcomes Reduced anxiety;Reduced fear;Connection to spiritual care;Patient family open to resources    Chaplain responded to Level 2 page. Pt not in room. Family present (daughter and sister). Chaplain provided prayer at family's request, and remains available.

## 2023-07-18 NOTE — Progress Notes (Signed)
 ANTICOAGULATION CONSULT NOTE  Pharmacy Consult for Heparin Indication: Mechanical valve holding warfarin 2/2 SDH  Allergies  Allergen Reactions   Penicillins    Penicillins Other (See Comments)    Couldn't walk    Patient Measurements: Height: 4' 11 (149.9 cm) Weight: 96.2 kg (212 lb 1.3 oz) IBW/kg (Calculated) : 43.2 Heparin Dosing Weight: 66.7  Vital Signs: Temp: 98.1 F (36.7 C) (06/23 1045) Temp Source: Oral (06/23 1045) BP: 148/67 (06/23 1045) Pulse Rate: 96 (06/23 1045)  Labs: Recent Labs    07/18/23 1047 07/18/23 1215  HGB 12.1 13.3  HCT 38.4 39.0  PLT 228  --   APTT 29  --   LABPROT 22.0*  --   INR 1.9*  --   CREATININE 1.14* 1.10*    Estimated Creatinine Clearance: 49.1 mL/min (A) (by C-G formula based on SCr of 1.1 mg/dL (H)).   Medical History: Past Medical History:  Diagnosis Date   Chronic anticoagulation    Claudication (HCC)    History of CVA (cerebrovascular accident)    Hypertension    Mitral stenosis    Seizure (HCC)    HX    Medications:  (Not in a hospital admission)  Scheduled:   [START ON 07/19/2023]  stroke: early stages of recovery book   Does not apply Once   [START ON 07/19/2023] atorvastatin   20 mg Oral Daily   levETIRAcetam   500 mg Oral BID   [START ON 07/19/2023] metoprolol  tartrate  50 mg Oral q morning   [START ON 07/19/2023] pantoprazole  40 mg Oral Daily   [START ON 07/19/2023] spironolactone   25 mg Oral Daily   Infusions:  PRN: acetaminophen  **OR** acetaminophen  (TYLENOL ) oral liquid 160 mg/5 mL **OR** acetaminophen , senna-docusate  Assessment: 22 yof with a history of HTN, AF, GERD, mitral stenosis s/p mechanical valve. Seizure, CVA. Patient is presenting with seizure like activity. Heparin per pharmacy consult placed for mechanical valve, holding warfarin. Neurology requests low-dose protocol.  SDH noted on CT Head. Neurology does not recommend reversal of anticoagulation or holding at this time given small  SDH. Patient is on warfarin pta for mechanical valve. Home dose is 5mg  daily per last [facility] visit. INR was sub-therapeutic then (6/20: Instructed to take 7.5 mg 6/20 and 6/21 then resume 5 mg daily). INR goal is normally 2.5-3.5.  Repeat CTH ordered as speech noted to be worsening. Neurology requests [facility] repeat prior to heparin start.  Hgb 13.3; plt 228 PT/INR 22/1.9  Goal of Therapy:  Heparin level 0.3-0.5 units/ml Monitor platelets by anticoagulation protocol: Yes   Plan:  INR < 2.5  --Repeat CTH ordered as speech noted to be worsening. Neurology requests [facility] repeat prior to heparin start. >> Repeat CTH without acute findings in relation to bleeding -- ok to initiate heparin at this time per Dr. Arora.  Will withhold initial heparin bolus Start heparin infusion at 800 units/hr Check anti-Xa level in 8 hours and daily while on heparin Continue to monitor H&H and platelets  Dorn Buttner, PharmD, BCPS 07/18/2023 2:19 PM ED Clinical Pharmacist -  [phone]

## 2023-07-18 NOTE — ED Notes (Signed)
 Pt changed into gown and bottoms removed as patient had urinated on herself.

## 2023-07-18 NOTE — Consult Note (Addendum)
 NEUROLOGY CONSULT NOTE   Date of service: July 18, 2023 Patient Name: Natasha Davis MRN:  [id number] DOB:  02-17-1955 Chief Complaint: Syncope versus seizure, left-sided weakness after the event Requesting Provider: Lenor Hollering, MD  History of Present Illness  Natasha Davis is a 68 y.o. 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  She was somewhat lethargic on arrival but was starting to get more lucid.  On initial EDP evaluation, she had some mild left facial weakness, left arm weakness and predominant slurred speech for which a code stroke was activated. She reports that the weakness and sensory difference on the left side has been present since the last stroke but appeared somewhat worse but the slurred speech is something new and much worse than her baseline. Taken for stat CT and CTA-both of which personally reviewed and negative.   LKW: 10 AM Modified rankin score: 2-Slight disability-UNABLE to perform all activities but does not need assistance IV Thrombolysis: No-on chronic anticoagulation, later on reported INR 1.9 EVT: No-no ELVO  NIHSS components Score: Comment  1a Level of Conscious 0[x]  1[]  2[]  3[]      1b LOC Questions 0[x]  1[]  2[]       1c LOC Commands 0[x]  1[]  2[]       2 Best Gaze 0[x]  1[]  2[]       3 Visual 0[]  1[x]  2[]  3[]      4 Facial Palsy 0[]  1[x]  2[]  3[]      5a Motor Arm - left 0[x]  1[]  2[]  3[]  4[]  UN[]    5b Motor Arm - Right 0[x]  1[]  2[]  3[]  4[]  UN[]    6a Motor Leg - Left 0[x]  1[]  2[]  3[]  4[]  UN[]    6b Motor Leg - Right 0[x]  1[]  2[]  3[]  4[]  UN[]    7 Limb Ataxia 0[x]  1[]  2[]  UN[]      8 Sensory 0[]  1[x]  2[]  UN[]      9 Best Language 0[]  1[x]  2[]  3[]      10 Dysarthria 0[]  1[]  2[x]  UN[]       11 Extinct. and Inattention 0[]  1[]  2[x]       TOTAL: 8      ROS  Comprehensive ROS performed and pertinent positives documented in HPI   Past History   Past Medical History:  Diagnosis Date   Chronic anticoagulation    Claudication (HCC)    History of CVA (cerebrovascular accident)    Hypertension    Mitral stenosis    Seizure (HCC)    HX    Past Surgical History:  Procedure Laterality Date   CARDIAC CATHETERIZATION  06/01/96   NORMAL LEFT VENTRICULAR SYSTOLIC FUNCTION. EF 60%   CESAREAN SECTION     X2   MITRAL VALVE REPLACEMENT      Family History: Family History  Problem Relation Age of Onset   Hypertension Mother    Heart disease Father    Hypertension Father    Heart attack Father    Stroke Brother     Social History  reports that she quit smoking about 17 years ago. Her smoking use included cigarettes. She has never used smokeless tobacco. She reports that she does not currently use alcohol. She reports that she does not currently use drugs.  Allergies  Allergen Reactions   Penicillins    Penicillins  Other (See Comments)    Couldn't walk    Medications   Current Facility-Administered Medications:    sodium chloride 0.9 % bolus 962 mL, 10 mL/kg, Intravenous, Once, Sofia, Leslie K, PA-C  Current Outpatient Medications:    acetaminophen  (TYLENOL ) 650 MG CR tablet, Take 650 mg by mouth every 8 (eight) hours as needed. Pain, Disp: , Rfl:    atorvastatin  (LIPITOR) 20 MG tablet, TAKE 1 TABLET BY MOUTH EVERY DAY, Disp: 90 tablet, Rfl: 2   chlorhexidine  (PERIDEX ) 0.12 % solution, Use as directed 15 mLs in the mouth or throat 2 (two) times daily., Disp: 120 mL, Rfl: 0   Homeopathic Products (EARACHE DROPS OT), Place 1 drop into both ears 2 (two) times daily as needed (pain)., Disp: , Rfl:    levETIRAcetam  (KEPPRA ) 500 MG tablet, Take 500 mg by mouth in the morning., Disp: , Rfl:    metoprolol  tartrate (LOPRESSOR ) 50 MG tablet, TAKE 1 TABLET BY MOUTH IN THE   MORNING, Disp: 90 tablet, Rfl: 3   Multiple Vitamins-Minerals (CENTRUM SILVER ADULT 50+) TABS, Take 1 tablet by mouth in the morning., Disp: , Rfl:    omeprazole  (PRILOSEC) 20 MG capsule, TAKE 2 CAPSULES BY MOUTH EVERY DAY, Disp: 180 capsule, Rfl: 2   potassium chloride  (KLOR-CON ) 10 MEQ tablet, Take 2 tablets (20 mEq total) by mouth 2 (two) times daily., Disp: 360 tablet, Rfl: 3   sodium chloride (OCEAN) 0.65 % SOLN nasal spray, Place 1 spray into both nostrils 2 (two) times daily as needed for congestion., Disp: , Rfl:    spironolactone  (ALDACTONE ) 25 MG tablet, Take 1 tablet (25 mg total) by mouth daily., Disp: 90 tablet, Rfl: 3   torsemide  (DEMADEX ) 20 MG tablet, Take 1 tablet (20 mg total) by mouth daily., Disp: 90 tablet, Rfl: 3   warfarin (COUMADIN ) 5 MG tablet, TAKE 1 TABLET BY MOUTH DAILY OR AS DIRECTED BY COUMADIN  CLINIC, Disp: 100 tablet, Rfl: 1  Vitals   Vitals:   07/27/23 1029 07-27-23 1031 07/27/23 1045  BP:  (!) 148/67 (!) 148/67  Pulse:  96 96  Resp:  (!) 22 (!) 22  TempSrc:  Oral   SpO2: 98% 99% 99%  Weight:  96.2 kg   Height:  4' 11 (1.499 m)     Body mass index is 42.84 kg/m.   Physical Exam   General: Awake alert in no distress HEENT: Normocephalic atraumatic Chest clear Cardiovascular: Regular rhythm Neurological exam She is awake alert oriented x 3 Seems like she has a little bit of word finding difficulty but is able to name, comprehend and repeat all instructions. She is moderate to severely dysarthric Cranial nerves II to XII: Pupils equal round react light, extraocular movements intact, visual fields appear full but on double simultaneous stimulation, she tends to extinguish the left side, facial sensation also diminished on the left face without extinction, subtle left nasolabial fold flattening at rest which she is able to overcome when she smiles, tongue and palate midline. Motor examination with no drift in any of the 4 extremities Sensation  diminished on the left and extinguishes the left on double simultaneous stimulation Coordination intact   Labs/Imaging/Neurodiagnostic studies   CBC:  Recent Labs  Lab 2023/07/27 1047  WBC 8.0  NEUTROABS 6.0  HGB 12.1  HCT 38.4  MCV 97.5  PLT 228   Basic Metabolic Panel:  Lab Results  Component Value Date   NA 137 07-27-2023   K 3.7 July 27, 2023   CO2 21 (L)  07/18/2023   GLUCOSE 171 (H) 07/18/2023   BUN 17 07/18/2023   CREATININE 1.14 (H) 07/18/2023   CALCIUM  9.3 07/18/2023   GFRNONAA 52 (L) 07/18/2023   GFRAA 106 03/26/2016   Lipid Panel:  Lab Results  Component Value Date   LDLCALC 77 03/26/2016   HgbA1c: No results found for: HGBA1C Urine Drug Screen:     Component Value Date/Time   LABOPIA NONE DETECTED 09/04/2010 1617   COCAINSCRNUR NONE DETECTED 09/04/2010 1617   LABBENZ NONE DETECTED 09/04/2010 1617   AMPHETMU NONE DETECTED 09/04/2010 1617   THCU NONE DETECTED 09/04/2010 1617   LABBARB NONE DETECTED 09/04/2010 1617    Alcohol Level     Component Value Date/Time   ETH <15 07/18/2023 1050   INR  Lab Results  Component Value Date   INR 1.9 (H) 07/18/2023   APTT  Lab Results  Component Value Date   APTT 29 07/18/2023   CT Head without contrast(Personally reviewed): No acute intracranial abnormality  CT angio Head and Neck with contrast(Personally reviewed): No acute intracranial abnormality or LVO.  CTA head reveals 1 to 2 mm vascular protrusion from the cavernous right ICA and the paraclinoid left ICA which may reflect an aneurysm or infundibulum.  MR brain-ordered  ASSESSMENT   Natasha Davis is a 68 y.o. female with past medical history as above presenting after a witnessed fall and seizure, with last seizure over a decade ago. 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 CT head and CT angio head and neck with no acute abnormality or emergent large vessel occlusion. Stat MRI would be helpful in making sure that there is no other areas of stroke especially in the left hemisphere given the word finding difficulty.  Impression: Breakthrough seizure Evaluate for new stroke Evaluate for intracranial bleed due to trauma/fall  RECOMMENDATIONS  At this time, I would recommend a stat MRI of the brain. Load with Keppra -3000 mg x 1 Not sure if compliant with Keppra  at home. Start Keppra  500 mg BID after the load. Will try to add Keppra  level to the previously drawn labs to see what comes out. Will try to get some collateral from the family once available  Addendum Stat MRI completed and reviewed with radiologist. Multiple findings: - Small punctate subacute appearing infarct in the left parietal region - Sliver of a subdural hematoma versus dural thickening in the right frontoparietal region underlying the scalp hematoma.  This was not visualized on the CT head.  Updated impression: Likely breakthrough seizure leading to fall Punctate subacute ischemic infarct in the left parietal region-likely incidental Sliver of subdural hematoma over the right frontoparietal convexity-likely due to trauma from fall and coagulopathy from Coumadin    Updated recommendations:  For the possible breakthrough seizures: Loaded with Keppra  3 g x 1.  Continue Keppra  500 twice daily.  Maintain seizure precautions  For the stroke that is being seen on the MRI, she is already on anticoagulation for her valve.  The mechanism and etiology of the stroke is likely cardioembolic.  I would recommend completing the workup by doing a 2D echocardiogram ensuring that the valve looks okay.  Check A1c and lipid panel and keep A1c below 7 and LDL below 70 with  a  statin.  For the subdural hematoma seen on MRI: Etiology likely traumatic.  I would not hold her anticoagulation because of her valve for such a small finding of the subdural hematoma.  I would recommend transitioning her to heparin drip while inpatient and repeating a head CT in about 6 hours for hematoma stability.  She can continue heparin overnight and a CT head can be repeated in the morning.  If all the scans remains stable and the bleed is not seen on the CT, she can be put back on her Coumadin  starting tomorrow midday. SBP goal 130-150  Plan was discussed with Natasha Showers, PA-C in the ED & Dr Marsa Scurry by secure chat.   ______________________________________________________________________    Signed, Eligio Lav, MD [facility]  CRITICAL CARE ATTESTATION Performed by: Eligio Lav, MD Total critical care time: 55 minutes Critical care time was exclusive of separately billable procedures and treating other patients and/or supervising APPs/Residents/Students Critical care was necessary to treat or prevent imminent or life-threatening deterioration. This patient is critically ill and at significant risk for neurological worsening and/or death and care requires constant monitoring. Critical care was time spent personally by me on the following activities: development of treatment plan with patient and/or surrogate as well as nursing, discussions with consultants, evaluation of patient's response to treatment, examination of patient, obtaining history from patient or surrogate, ordering and performing treatments and interventions, ordering and review of laboratory studies, ordering and review of radiographic studies, pulse oximetry, re-evaluation of patient's condition, participation in multidisciplinary rounds and medical decision making of high complexity in the care of this patient.

## 2023-07-18 NOTE — ED Notes (Signed)
 EEG technician bedside

## 2023-07-18 NOTE — ED Notes (Signed)
 Pt transported by stoke RN to MRI

## 2023-07-18 NOTE — Plan of Care (Signed)
 Spot EEG with slowing only. Will leave hooked up to LTM EEG Will follow

## 2023-07-18 NOTE — ED Provider Notes (Signed)
 [place] EMERGENCY DEPARTMENT AT [facility] Provider Note   CSN: [id number] Arrival date & time: 07/18/23  1025  An emergency department physician performed an initial assessment on this suspected stroke patient at 16.  Patient presents with: Seizures   Natasha Davis is a 68 y.o. female.    patient brought in by EMS after having what appeared to be a seizure family reported to EMS that patient was in the kitchen cooking when she fell to the floor and had approximately 1 minute of jerking.  Patient did strike her head.  Patient has been weak confused and having difficulty speaking since this episode.  Patient currently awake, having difficulty speaking.  She is able to squeeze my left hand seems to have some left-sided weakness.  Patient is currently on Coumadin .  The history is provided by the patient. No language interpreter was used.  Seizures Seizure activity on arrival: no   Return to baseline: no   Severity:  Severe Trauma Mechanism of injury: Fall Incident location: kitchen Time since incident: 1 hour Arrived directly from scene: yes   Fall:      Fall occurred: standing      Point of impact: head and neck      Suspicion of alcohol use: no      Suspicion of drug use: no  EMS/PTA data:      Ambulatory at scene: no      Oriented to: person      Loss of consciousness: yes      Loss of consciousness duration: 1 minute      Amnesic to event: yes      Fluids administered: none      Cardiac interventions: none      Mental status condition since incident: improving  Current symptoms:      Associated symptoms:            Reports loss of consciousness and seizures.   Relevant PMH:      Pharmacological risk factors:            Anticoagulation therapy.       Tetanus status: unknown Cerebrovascular Accident This is a new problem. The current episode started less than 1 hour ago. The problem occurs constantly. The problem has not changed since onset.The  treatment provided no relief.       Prior to Admission medications   Medication Sig Start Date End Date Taking? Authorizing Provider  acetaminophen  (TYLENOL ) 650 MG CR tablet Take 650 mg by mouth every 8 (eight) hours as needed. Pain    [provider]  atorvastatin  (LIPITOR) 20 MG tablet TAKE 1 TABLET BY MOUTH EVERY DAY 11/03/22   Nahser, Aleene PARAS, MD  chlorhexidine  (PERIDEX ) 0.12 % solution Use as directed 15 mLs in the mouth or throat 2 (two) times daily. 01/18/22   Jerral Meth, MD  Homeopathic Products (EARACHE DROPS OT) Place 1 drop into both ears 2 (two) times daily as needed (pain).    [provider]  levETIRAcetam  (KEPPRA ) 500 MG tablet Take 500 mg by mouth in the morning.    [provider]  metoprolol  tartrate (LOPRESSOR ) 50 MG tablet TAKE 1 TABLET BY MOUTH IN THE  MORNING 05/17/23   Nahser, Aleene PARAS, MD  Multiple Vitamins-Minerals (CENTRUM SILVER ADULT 50+) TABS Take 1 tablet by mouth in the morning.    [provider]  omeprazole  (PRILOSEC) 20 MG capsule TAKE 2 CAPSULES BY MOUTH EVERY DAY 11/11/22   Nahser, Aleene PARAS,  MD  potassium chloride  (KLOR-CON ) 10 MEQ tablet Take 2 tablets (20 mEq total) by mouth 2 (two) times daily. 02/01/23   Nahser, Aleene PARAS, MD  sodium chloride (OCEAN) 0.65 % SOLN nasal spray Place 1 spray into both nostrils 2 (two) times daily as needed for congestion.    [provider]  spironolactone  (ALDACTONE ) 25 MG tablet Take 1 tablet (25 mg total) by mouth daily. 03/28/23   Nahser, Aleene PARAS, MD  torsemide  (DEMADEX ) 20 MG tablet Take 1 tablet (20 mg total) by mouth daily. 03/30/23   Nahser, Aleene PARAS, MD  warfarin (COUMADIN ) 5 MG tablet TAKE 1 TABLET BY MOUTH DAILY OR AS DIRECTED BY COUMADIN  CLINIC 06/02/23   Nahser, Aleene PARAS, MD    Allergies: Penicillins and Penicillins    Review of Systems  Neurological:  Positive for seizures and loss of consciousness.  All other systems reviewed and are negative.   Updated Vital  Signs BP (!) 148/67   Pulse 96   Resp (!) 22   Ht 4' 11 (1.499 m)   Wt 96.2 kg   SpO2 99%   BMI 42.84 kg/m   Physical Exam Vitals and nursing note reviewed.  Constitutional:      Appearance: Normal appearance. She is well-developed.  HENT:     Head: Normocephalic.     Right Ear: Tympanic membrane normal.     Left Ear: Tympanic membrane normal.     Nose: Nose normal.     Mouth/Throat:     Mouth: Mucous membranes are moist.   Eyes:     Extraocular Movements: Extraocular movements intact.     Pupils: Pupils are equal, round, and reactive to light.    Cardiovascular:     Rate and Rhythm: Normal rate and regular rhythm.  Pulmonary:     Effort: Pulmonary effort is normal.  Abdominal:     General: There is no distension.     Tenderness: There is no abdominal tenderness.   Musculoskeletal:        General: Normal range of motion.     Cervical back: Normal range of motion.   Skin:    General: Skin is warm.   Neurological:     Mental Status: She is alert.     Cranial Nerves: Cranial nerve deficit present.     Motor: Weakness present.     Comments: Slurred speech, difficulty speaking, decreased grip left hand,     (all labs ordered are listed, but only abnormal results are displayed) Labs Reviewed  COMPREHENSIVE METABOLIC PANEL WITH GFR - Abnormal; Notable for the following components:      Result Value   CO2 21 (*)    Glucose, Bld 171 (*)    Creatinine, Ser 1.14 (*)    GFR, Estimated 52 (*)    Anion gap 17 (*)    All other components within normal limits  PROTIME-INR - Abnormal; Notable for the following components:   Prothrombin Time 22.0 (*)    INR 1.9 (*)    All other components within normal limits  I-STAT CHEM 8, ED - Abnormal; Notable for the following components:   Creatinine, Ser 1.10 (*)    Glucose, Bld 164 (*)    Calcium , Ion 1.09 (*)    All other components within normal limits  CBC WITH DIFFERENTIAL/PLATELET  ETHANOL  APTT  RAPID URINE DRUG  SCREEN, HOSP PERFORMED  CBG MONITORING, ED  TYPE AND SCREEN    EKG: None  Radiology: CT Cervical Spine Wo Contrast  Result Date: 07/18/2023 CLINICAL DATA:  Neck trauma, dangerous injury mechanism (Age 65-64y) EXAM: CT CERVICAL SPINE WITHOUT CONTRAST TECHNIQUE: Multidetector CT imaging of the cervical spine was performed without intravenous contrast. Multiplanar CT image reconstructions were also generated. RADIATION DOSE REDUCTION: This exam was performed according to the departmental dose-optimization program which includes automated exposure control, adjustment of the mA and/or kV according to patient size and/or use of iterative reconstruction technique. COMPARISON:  None Available. FINDINGS: Alignment: Slight degenerative anterolisthesis at C4-5. Slight reversal of the normal cervical lordosis. Skull base and vertebrae: Intact. No osseous lesions are present. There is a prominent vascular foramen present on the left at C4. Soft tissues and spinal canal: No hematoma or significant soft tissue swelling present. Disc levels: Endplate ridging with mild central spinal canal stenosis and mild bilateral neural foraminal stenosis at C5-6. There is also mild central spinal canal stenosis and bilateral neural foraminal stenosis at C6-7. There are moderate facet hypertrophic changes on the right at C2-3. Upper chest: Unremarkable. Other: None. IMPRESSION: 1. Mild-to-moderate cervical spondylosis without evidence of acute traumatic injury. Electronically Signed   By: Evalene Coho M.D.   On: 07/18/2023 11:37   CT ANGIO HEAD NECK W WO CM (CODE STROKE) Result Date: 07/18/2023 CLINICAL DATA:  Provided history: Neuro deficit, acute, stroke suspected. EXAM: CT ANGIOGRAPHY HEAD AND NECK WITH AND WITHOUT CONTRAST TECHNIQUE: Multidetector CT imaging of the head and neck was performed using the standard protocol during bolus administration of intravenous contrast. Multiplanar CT image reconstructions and MIPs were  obtained to evaluate the vascular anatomy. Carotid stenosis measurements (when applicable) are obtained utilizing NASCET criteria, using the distal internal carotid diameter as the denominator. RADIATION DOSE REDUCTION: This exam was performed according to the departmental dose-optimization program which includes automated exposure control, adjustment of the mA and/or kV according to patient size and/or use of iterative reconstruction technique. CONTRAST:  75mL OMNIPAQUE  IOHEXOL  350 MG/ML SOLN COMPARISON:  Head CT 07/18/2023 noncontrast head CT performed earlier today 07/18/2023. FINDINGS: CTA NECK FINDINGS Aortic arch: Standard aortic branching. Atherosclerotic plaque within the visualized aortic arch and proximal major branch vessels of the neck. No hemodynamically significant innominate or proximal subclavian artery stenosis. Right carotid system: CCA and ICA patent within the neck without stenosis or significant atherosclerotic disease. Left carotid system: CCA and ICA patent within the neck without stenosis. Mild nonstenotic atherosclerotic plaque at the CCA origin. Vertebral arteries: Codominant and patent within the neck without stenosis or significant atherosclerotic disease. Skeleton: Cervical spine findings separately reported on same day cervical spine CT. The patient is edentulous. Other neck: No neck mass or cervical lymphadenopathy. Upper chest: No consolidation within the imaged lung apices. Prior median sternotomy. Review of the MIP images confirms the above findings CTA HEAD FINDINGS Anterior circulation: The intracranial internal carotid arteries are patent. Atherosclerotic plaque within both vessels with no more than mild stenosis. The M1 middle cerebral arteries are patent. No M2 proximal branch occlusion or high-grade proximal stenosis. 1-2 mm posterolaterally projecting vascular protrusion arising from the cavernous right internal carotid artery, which may reflect an aneurysm or the origin of  an otherwise poorly delineated branch vessel (series 9, image 215). 1-2 mm inferiorly projecting vascular protrusion arising from the paraclinoid left internal carotid artery, which may reflect an aneurysm or infundibulum (series 9, images 226 and 227). Posterior circulation: The intracranial vertebral arteries are patent. The basilar artery is patent. The posterior cerebral arteries are patent. Posterior communicating arteries are diminutive or absent, bilaterally. Venous sinuses: Within  the limitations of contrast timing, no convincing thrombus. Anatomic variants: As described. Review of the MIP images confirms the above findings No emergent large vessel occlusion identified. These results were communicated to Dr. Voncile at 11:32 amon 6/23/2025by text page via the [facility] messaging system. IMPRESSION: CTA neck: 1. The common carotid and internal carotid arteries are patent within the neck. Non-stenotic atherosclerotic plaque at the left common carotid artery origin. 2. Vertebral arteries patent within the neck without stenosis or significant atherosclerotic disease. 3. Aortic Atherosclerosis (ICD10-I70.0). 4. Cervical spine findings separately reported on same day cervical spine CT. CTA head: 1. No proximal intracranial large vessel occlusion or high-grade proximal arterial stenosis identified. 2. Atherosclerotic plaque within the intracranial internal carotid arteries with no more than mild stenosis. 3. 1-2 mm vascular protrusion arising from the cavernous right internal carotid artery, which may reflect an aneurysm or the origin of an otherwise poorly delineated branch vessel. 4. 1-2 mm vascular protrusion arising from the paraclinoid left internal carotid artery, which may reflect an aneurysm or infundibulum. Electronically Signed   By: Rockey Childs D.O.   On: 07/18/2023 11:34   CT HEAD CODE STROKE WO CONTRAST Result Date: 07/18/2023 CLINICAL DATA:  Code stroke. Neuro deficit, acute, stroke suspected. EXAM: CT  HEAD WITHOUT CONTRAST TECHNIQUE: Contiguous axial images were obtained from the base of the skull through the vertex without intravenous contrast. RADIATION DOSE REDUCTION: This exam was performed according to the departmental dose-optimization program which includes automated exposure control, adjustment of the mA and/or kV according to patient size and/or use of iterative reconstruction technique. COMPARISON:  Head CT 09/04/2010. FINDINGS: Brain: Large chronic right MCA territory cortical/subcortical infarct affecting the frontal, temporal and parietal lobes, not appreciably changed from the prior head CT of 09/04/2010. Background mild patchy and ill-defined hypoattenuation within the cerebral white matter, nonspecific but compatible chronic small vessel ischemic disease. Chronic lacunar infarct within the left thalamus, unchanged. There is no acute intracranial hemorrhage. No acute demarcated cortical infarct. No extra-axial fluid collection. No evidence of an intracranial mass. No midline shift. Vascular: No hyperdense vessel.  Atherosclerotic calcifications. Skull: No calvarial fracture or aggressive osseous lesion. Sinuses/Orbits: No mass or acute finding within the imaged orbits. No significant paranasal sinus disease at the imaged levels. ASPECTS [facility] Stroke Program Early CT Score) - Ganglionic level infarction (caudate, lentiform nuclei, internal capsule, insula, M1-M3 cortex): 7 - Supraganglionic infarction (M4-M6 cortex): 3 Total score (0-10 with 10 being normal): 10 (when discounting a chronic right MCA territory infarct). No evidence of an acute intracranial abnormality. These results were communicated to Dr. Voncile at 11:15 amon 6/23/2025by text page via the [facility] messaging system. IMPRESSION: 1. No evidence of an acute intracranial abnormality. 2. Known large chronic right MCA territory infarct. 3. Mild background cerebral white matter chronic small vessel ischemic disease, progressed from the  prior head CT of 09/04/2010. 4. Chronic left thalamic lacunar infarct, unchanged. Electronically Signed   By: Rockey Childs D.O.   On: 07/18/2023 11:15     .Critical Care  Performed by: Flint Sonny POUR, PA-C Authorized by: Flint Sonny POUR, PA-C   Critical care provider statement:    Critical care time (minutes):  74   Critical care start time:  07/18/2023 10:50 AM   Critical care end time:  07/18/2023 2:09 PM   Critical care time was exclusive of:  Separately billable procedures and treating other patients   Critical care was necessary to treat or prevent imminent or life-threatening deterioration of the following conditions:  CNS failure or compromise and trauma   Critical care was time spent personally by me on the following activities:  Vascular access procedures, review of old charts, re-evaluation of patient's condition, pulse oximetry, ordering and review of laboratory studies, ordering and review of radiographic studies, examination of patient, evaluation of patient's response to treatment and discussions with consultants   Care discussed with: admitting provider      Medications Ordered in the ED  sodium chloride 0.9 % bolus 962 mL (962 mLs Intravenous New Bag/Given 07/18/23 1221)  iohexol  (OMNIPAQUE ) 350 MG/ML injection 75 mL (75 mLs Intravenous Contrast Given 07/18/23 1112)  LORazepam (ATIVAN) injection 1 mg (1 mg Intravenous Given 07/18/23 1123)  levETIRAcetam  (KEPPRA ) undiluted injection 3,000 mg (3,000 mg Intravenous Given 07/18/23 1218)                                    Medical Decision Making Patient meets criteria for level 2 trauma and code stroke.  Level 2 trauma activated.  I spoke to Dr. Deedra and activated code stroke.  Amount and/or Complexity of Data Reviewed Independent Historian: caregiver and EMS    Details: EMS reported seizure and fall.  I spoke with patient's family after patient had CT scan they report that she still is not speaking normally. External Data  Reviewed: notes.    Details: Cardiology notes reviewed patient has a history of a mitral valve replacement.  She is on Coumadin  Labs: ordered. Decision-making details documented in ED Course.    Details: Labs ordered reviewed and interpreted.  GFR is 52 Radiology: ordered and independent interpretation performed. Decision-making details documented in ED Course.    Details: CT head and CT cervical spine show no evidence of acute injury. CT angio no acute findings. Patient has a chronic right MCA territory infarct Discussion of management or test interpretation with external provider(s): Patient seen and evaluated by stroke team and Dr. Deedra.  Patient is to have a MRI for further evaluation.  Patient is given Keppra  IV. Trauma RN responded to evaluate patient.   Risk Decision regarding hospitalization.        Final diagnoses:  Seizure [facility])  Cerebrovascular accident (CVA), unspecified mechanism (HCC)  Subdural hematoma [facility])    ED Discharge Orders     None          Flint Sonny MARLA DEVONNA 07/18/23 1410    Lenor Hollering, MD 07/19/23 [phone]

## 2023-07-18 NOTE — Code Documentation (Signed)
 Stroke Response Nurse Documentation Code Documentation  Natasha Davis is a 68 y.o. female arriving to [facility]  via EMS on 6/823/2025 with past medical hx of mitral stenosis status post MVR, systolic dysfunction, hypertension, history of stroke, seizure and atrial fibrillation. On warfarin daily. Code stroke was activated by ED.   Patient from home where she was LKW at 0945. Granddaughter witnessed her go to the bathroom and heard her fall when she was out of the bathroom. Called family who told her to call 911.   Stroke team at the bedside on patient arrival. Labs drawn and patient cleared for CT by EDP. Patient to CT with team. NIHSS 11, see documentation for details and code stroke times. Patient with disoriented, right facial droop, left decreased sensation, Expressive aphasia , dysarthria , and Sensory  neglect on exam. The following imaging was completed:  CT Head, CTA, and MRI. Ativan given for MRI due to report of claustrophobia. Pulse ox monitored in MRI. Suctioned prior to MRI due to decreased ability to control secretions. Patient is not a candidate for IV Thrombolytic due to INR 1.9. Patient is not a candidate for IR due to no LVO. NIHSS improving post MRI.   Care Plan: keppra  given post MRI, Q2 hour NIHSS and VS.   Bedside handoff with ED RN Bruno.    Madelin Manila Stroke Response RN

## 2023-07-18 NOTE — ED Notes (Signed)
 Admitting MD at bedside.

## 2023-07-18 NOTE — ED Notes (Signed)
 Trauma Response Nurse Documentation   Natasha Davis is a 68 y.o. female arriving to [facility] ED via EMS  On warfarin daily. Trauma was activated as a Level 2 by ED PA based on the following trauma criteria Elderly patients > 65 with head trauma on anti-coagulation (excluding ASA).  Patient cleared for CT by Dr. Lenor. Pt transported to CT with primary nurse, stroke response nurse and trauma response nurse present to monitor. RNs remained with the patient throughout their absence from the department for clinical observation.   GCS 12.  History   Past Medical History:  Diagnosis Date   Chronic anticoagulation    Claudication [facility])    History of CVA (cerebrovascular accident)    Hypertension    Mitral stenosis    Seizure (HCC)    HX     Past Surgical History:  Procedure Laterality Date   CARDIAC CATHETERIZATION  06/01/96   NORMAL LEFT VENTRICULAR SYSTOLIC FUNCTION. EF 60%   CESAREAN SECTION     X2   MITRAL VALVE REPLACEMENT       Initial Focused Assessment (If applicable, or please see trauma documentation): Airway: Intact, tongue enlarged but airway patent. No teeth present. Breathing: Breath sounds clear, equal bilaterally. No CP and SOB Circulation: No external hemorrhage noted. R parietal scalp hematoma palpated. Pulses intact throughout.  A-fib on the monitor with a HR in the 90's. SBP: Normotensive with intermittent mild hypertension.  PIV established.  Disability: PERRLA 2's brisk. Pt nodding appropriately and FC consistently however unable to verbalize; only able to grunt. L arm and L leg weakness noted with decreased sensation.  Stroke team present at the bedside.   CT's Completed:   CT Head, CT C-Spine, and CT Angio Head CTA neck.   Interventions:  Labwork drawn CXR Pelvic XR C-spine assessed and no tenderness noted. CT head, C-spine, CTA of head and neck. MRI of head completed.  Neuro status continuously assessed.   Plan for disposition:  Other Unsure at  this time.   Consults completed:  none at 1130.  Event Summary: Pt was at home with her 45 year old grand-daughter when she came out of the bathroom and had a sudden fall and presumed seizure-like activity lasting approx 1 min. Pt's grand-daughter called 911 and EMS brought pt to ED.  Pt's mental status was initially fairly intact upon arrival to ED but then declined and pt started having aphasia, slurred speech, L facial droop and L side weakness.  Due to patient having a fall and being on coumadin , pt was activated as a L2 trauma but also activated as a code stroke.  TRN met pt in CT alongside the stroke team. Pt's NIHSS 8 at this time. Pt's head and c-spine CTs were negative for a traumatic bleed.  Pt was then taken to MRI. Trauma will be available if anything changes from a traumatic standpoint.    Bedside handoff with SRN Natasha Davis, Rapid Response RN, Natasha Davis.   Natasha Davis  Trauma Response RN  Please call TRN at [phone] for further assistance.

## 2023-07-18 NOTE — Progress Notes (Signed)
 vLTM setup  All impedances below 10kohm.  Patient in ED at present.  Not being monitored intermittently

## 2023-07-18 NOTE — Progress Notes (Signed)
 Orthopedic Tech Progress Note Patient Details:  Natasha Davis 1955/02/14 [id number]  LEVEL 2 TRAUMA   Patient ID: Natasha Davis, female   DOB: November 21, 1955, 68 y.o.   MRN: [id number]  Natasha Davis 07/18/2023, 11:02 AM

## 2023-07-18 NOTE — Procedures (Signed)
 Patient Name: Natasha Davis  MRN: [id number]  Epilepsy Attending: Arlin MALVA Krebs  Referring Physician/Provider: Voncile Isles, MD  Date: 07/18/2023 Duration: 21.43 mins  Patient history:  68 y.o. 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 EEG to evaluate for seizure.  Level of alertness: Awake, asleep  AEDs during EEG study: LEV, Ativan  Technical aspects: This EEG study was done with scalp electrodes positioned according to the 10-20 International system of electrode placement. Electrical activity was reviewed with band pass filter of 1-70Hz , sensitivity of 7 uV/mm, display speed of 64mm/sec with a 60Hz  notched filter applied as appropriate. EEG data were recorded continuously and digitally stored. Video monitoring was available and reviewed as appropriate.  Description: The posterior dominant rhythm consists of 8-9 Hz activity of moderate voltage (25-35 uV) seen predominantly in posterior head regions, symmetric and reactive to eye opening and eye closing. Sleep was characterized by vertex waves, sleep spindles (12 to 14 Hz), maximal frontocentral region. EEG showed intermittent generalized 3 to 6 Hz theta-delta slowing. Hyperventilation and photic stimulation were not performed.     ABNORMALITY - Intermittent slow, generalized  IMPRESSION: This study is suggestive of mild diffuse encephalopathy. No seizures or epileptiform discharges were seen throughout the recording.  Joeli Fenner O Annalee Meyerhoff

## 2023-07-19 ENCOUNTER — Observation Stay (HOSPITAL_COMMUNITY)

## 2023-07-19 DIAGNOSIS — K219 Gastro-esophageal reflux disease without esophagitis: Secondary | ICD-10-CM | POA: Diagnosis present

## 2023-07-19 DIAGNOSIS — I5022 Chronic systolic (congestive) heart failure: Secondary | ICD-10-CM | POA: Diagnosis present

## 2023-07-19 DIAGNOSIS — I6389 Other cerebral infarction: Secondary | ICD-10-CM

## 2023-07-19 DIAGNOSIS — Z823 Family history of stroke: Secondary | ICD-10-CM | POA: Diagnosis not present

## 2023-07-19 DIAGNOSIS — R9089 Other abnormal findings on diagnostic imaging of central nervous system: Secondary | ICD-10-CM

## 2023-07-19 DIAGNOSIS — Z79899 Other long term (current) drug therapy: Secondary | ICD-10-CM | POA: Diagnosis not present

## 2023-07-19 DIAGNOSIS — G40909 Epilepsy, unspecified, not intractable, without status epilepticus: Secondary | ICD-10-CM | POA: Diagnosis present

## 2023-07-19 DIAGNOSIS — G8384 Todd's paralysis (postepileptic): Secondary | ICD-10-CM | POA: Diagnosis present

## 2023-07-19 DIAGNOSIS — R4781 Slurred speech: Secondary | ICD-10-CM | POA: Diagnosis present

## 2023-07-19 DIAGNOSIS — W1830XA Fall on same level, unspecified, initial encounter: Secondary | ICD-10-CM | POA: Diagnosis present

## 2023-07-19 DIAGNOSIS — Z952 Presence of prosthetic heart valve: Secondary | ICD-10-CM | POA: Diagnosis not present

## 2023-07-19 DIAGNOSIS — I639 Cerebral infarction, unspecified: Secondary | ICD-10-CM | POA: Diagnosis present

## 2023-07-19 DIAGNOSIS — R29708 NIHSS score 8: Secondary | ICD-10-CM | POA: Diagnosis present

## 2023-07-19 DIAGNOSIS — E66813 Obesity, class 3: Secondary | ICD-10-CM | POA: Diagnosis present

## 2023-07-19 DIAGNOSIS — I69354 Hemiplegia and hemiparesis following cerebral infarction affecting left non-dominant side: Secondary | ICD-10-CM | POA: Diagnosis not present

## 2023-07-19 DIAGNOSIS — Z880 Allergy status to penicillin: Secondary | ICD-10-CM | POA: Diagnosis not present

## 2023-07-19 DIAGNOSIS — Z8249 Family history of ischemic heart disease and other diseases of the circulatory system: Secondary | ICD-10-CM | POA: Diagnosis not present

## 2023-07-19 DIAGNOSIS — Z7901 Long term (current) use of anticoagulants: Secondary | ICD-10-CM | POA: Diagnosis not present

## 2023-07-19 DIAGNOSIS — R29810 Facial weakness: Secondary | ICD-10-CM | POA: Diagnosis present

## 2023-07-19 DIAGNOSIS — Z87891 Personal history of nicotine dependence: Secondary | ICD-10-CM | POA: Diagnosis not present

## 2023-07-19 DIAGNOSIS — Z6841 Body Mass Index (BMI) 40.0 and over, adult: Secondary | ICD-10-CM | POA: Diagnosis not present

## 2023-07-19 DIAGNOSIS — I110 Hypertensive heart disease with heart failure: Secondary | ICD-10-CM | POA: Diagnosis present

## 2023-07-19 DIAGNOSIS — R569 Unspecified convulsions: Secondary | ICD-10-CM | POA: Diagnosis not present

## 2023-07-19 DIAGNOSIS — R471 Dysarthria and anarthria: Secondary | ICD-10-CM | POA: Diagnosis present

## 2023-07-19 DIAGNOSIS — I739 Peripheral vascular disease, unspecified: Secondary | ICD-10-CM | POA: Diagnosis present

## 2023-07-19 DIAGNOSIS — I4821 Permanent atrial fibrillation: Secondary | ICD-10-CM | POA: Diagnosis present

## 2023-07-19 DIAGNOSIS — I63422 Cerebral infarction due to embolism of left anterior cerebral artery: Secondary | ICD-10-CM | POA: Diagnosis present

## 2023-07-19 DIAGNOSIS — R531 Weakness: Secondary | ICD-10-CM | POA: Diagnosis not present

## 2023-07-19 LAB — CBC
HCT: 32.9 % — ABNORMAL LOW (ref 36.0–46.0)
Hemoglobin: 10.5 g/dL — ABNORMAL LOW (ref 12.0–15.0)
MCH: 30.8 pg (ref 26.0–34.0)
MCHC: 31.9 g/dL (ref 30.0–36.0)
MCV: 96.5 fL (ref 80.0–100.0)
Platelets: 205 10*3/uL (ref 150–400)
RBC: 3.41 MIL/uL — ABNORMAL LOW (ref 3.87–5.11)
RDW: 13.2 % (ref 11.5–15.5)
WBC: 7.3 10*3/uL (ref 4.0–10.5)
nRBC: 0 % (ref 0.0–0.2)

## 2023-07-19 LAB — LIPID PANEL
Cholesterol: 102 mg/dL (ref 0–200)
HDL: 39 mg/dL — ABNORMAL LOW (ref >40)
LDL Cholesterol: 52 mg/dL (ref 0–99)
Total CHOL/HDL Ratio: 2.6 ratio
Triglycerides: 57 mg/dL (ref <150)
VLDL: 11 mg/dL (ref 0–40)

## 2023-07-19 LAB — HEPARIN LEVEL (UNFRACTIONATED)
Heparin Unfractionated: 0.14 [IU]/mL — ABNORMAL LOW (ref 0.30–0.70)
Heparin Unfractionated: 0.3 [IU]/mL (ref 0.30–0.70)

## 2023-07-19 LAB — PROTIME-INR
INR: 1.8 — ABNORMAL HIGH (ref 0.8–1.2)
Prothrombin Time: 21.4 s — ABNORMAL HIGH (ref 11.4–15.2)

## 2023-07-19 LAB — ECHOCARDIOGRAM COMPLETE
Height: 59 in
MV VTI: 1.06 cm2
S' Lateral: 4.2 cm
Weight: 3393.32 [oz_av]

## 2023-07-19 LAB — COMPREHENSIVE METABOLIC PANEL WITH GFR
ALT: 21 U/L (ref 0–44)
AST: 35 U/L (ref 15–41)
Albumin: 3.5 g/dL (ref 3.5–5.0)
Alkaline Phosphatase: 85 U/L (ref 38–126)
Anion gap: 9 (ref 5–15)
BUN: 12 mg/dL (ref 8–23)
CO2: 22 mmol/L (ref 22–32)
Calcium: 8.4 mg/dL — ABNORMAL LOW (ref 8.9–10.3)
Chloride: 104 mmol/L (ref 98–111)
Creatinine, Ser: 0.97 mg/dL (ref 0.44–1.00)
GFR, Estimated: >60 mL/min (ref >60)
Glucose, Bld: 138 mg/dL — ABNORMAL HIGH (ref 70–99)
Potassium: 3.8 mmol/L (ref 3.5–5.1)
Sodium: 135 mmol/L (ref 135–145)
Total Bilirubin: 1.1 mg/dL (ref 0.0–1.2)
Total Protein: 6.1 g/dL — ABNORMAL LOW (ref 6.5–8.1)

## 2023-07-19 LAB — HEMOGLOBIN A1C
Hgb A1c MFr Bld: 5.7 % — ABNORMAL HIGH (ref 4.8–5.6)
Mean Plasma Glucose: 116.89 mg/dL

## 2023-07-19 LAB — HIV ANTIBODY (ROUTINE TESTING W REFLEX): HIV Screen 4th Generation wRfx: NONREACTIVE

## 2023-07-19 MED ORDER — WARFARIN - PHARMACIST DOSING INPATIENT: Freq: Every day | Status: DC

## 2023-07-19 MED ORDER — MELATONIN 3 MG PO TABS
6.0000 mg | ORAL_TABLET | Freq: Every evening | ORAL | Status: DC | PRN
  Filled 2023-07-19: qty 2

## 2023-07-19 MED ORDER — WARFARIN SODIUM 7.5 MG PO TABS
7.5000 mg | ORAL_TABLET | Freq: Once | ORAL | Status: AC
  Administered 2023-07-19: 7.5 mg via ORAL
  Filled 2023-07-19: qty 1

## 2023-07-19 MED ORDER — ENOXAPARIN SODIUM 100 MG/ML IJ SOSY
100.0000 mg | PREFILLED_SYRINGE | Freq: Two times a day (BID) | INTRAMUSCULAR | Status: DC
  Administered 2023-07-19 – 2023-07-20 (×2): 100 mg via SUBCUTANEOUS
  Filled 2023-07-19 (×2): qty 1

## 2023-07-19 MED ORDER — NYSTATIN 100000 UNIT/GM EX CREA
TOPICAL_CREAM | Freq: Two times a day (BID) | CUTANEOUS | Status: DC
  Filled 2023-07-19: qty 30

## 2023-07-19 MED ORDER — PERFLUTREN LIPID MICROSPHERE
1.0000 mL | INTRAVENOUS | Status: AC | PRN
  Administered 2023-07-19: 2 mL via INTRAVENOUS

## 2023-07-19 NOTE — Progress Notes (Signed)
 PHARMACY - ANTICOAGULATION CONSULT NOTE  Pharmacy Consult for heparin Indication: MVR in setting of SDH  Labs: Recent Labs    07/18/23 1047 07/18/23 1215 07/19/23 0110  HGB 12.1 13.3  --   HCT 38.4 39.0  --   PLT 228  --   --   APTT 29  --   --   LABPROT 22.0*  --   --   INR 1.9*  --   --   HEPARINUNFRC  --   --  0.14*  CREATININE 1.14* 1.10*  --    Assessment: 67yo female subtherapeutic on heparin with initial cautious dosing while warfarin on hold for small SDH; no infusion issues or signs of bleeding per RN.  Goal of Therapy:  Heparin level 0.3-0.5 units/ml   Plan:  Increase heparin infusion by 3 units/kgABW/hr to 1000 units/hr. Check level in 8 hours.   Natasha Davis, PharmD, BCPS 07/19/2023 2:06 AM

## 2023-07-19 NOTE — Progress Notes (Signed)
 PROGRESS NOTE  Natasha Davis FMW:[id number] DOB: 1955-08-12 DOA: 07/18/2023 PCP: Marvine Rush, MD   LOS: 0 days   Brief Narrative / Interim history: 68 year old female with HTN, A-fib, mitral stenosis status post mechanical mitral valve, seizure disorder, prior CVA comes into the hospital with a seizure episode at home.  She was in the kitchen cooking, suddenly fell on the floor, had 1 minute of seizure-like activity and hit her head.  She appeared to be postictal.  She was brought to the hospital, neurology was consulted.  Of note, on admission her INR was subtherapeutic at 1.9  Subjective / 24h Interval events: She is doing well today, feeling much better.  Almost back to normal.  Assesement and Plan: Principal Problem:   Acute CVA (cerebrovascular accident) (HCC) Active Problems:   History of mitral valve replacement with mechanical valve   HTN (hypertension)   GERD (gastroesophageal reflux disease)   Persistent atrial fibrillation (HCC)   Subdural hematoma (HCC)   Seizure (HCC)   Seizure disorder (HCC)   History of CVA (cerebrovascular accident)   Stroke (cerebrum) (HCC)  Principal problem Breakthrough seizures -discussed with neurology, this was apparently breakthrough seizure.  At home she is on Keppra  500 mg BID and she reports compliance with that.  Last seizure that patient experienced was more than a decade ago.  Given stability for so long, only 1 breakthrough seizure, neurology does not recommend changing therapy and she is to continue the current Keppra  doses. 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  Per neurology, this is an incidental.  She is to continue Coumadin  therapy.  She underwent a 2D echocardiogram which shows  LVEF 40-45%, global hypokinesis, LVH.  RV systolic function was mildly reduced.  Mitral valve looks good.  Overall echo is unchanged  Concern for subdural hematoma-this was on the MRI of the brain, however not noted of any of the other CT scans.  Per neurology, this is more likely to be dural thickening and they are okay with continuation of anticoagulation  Mechanical mitral valve -patient initially was placed on heparin.  She will be transition this afternoon to Coumadin  and Lovenox bridging.  She did not have to do Lovenox bridging in the past (she does remember having a nurse come to her home to some shots a long time ago). She will get education this afternoon with anticipation of going home tomorrow  Essential hypertension-continue metoprolol  in the setting of A-fib  Permanent A-fib-continue home metoprolol , anticoagulated with Coumadin   Obesity, morbid-BMI 42.  She would benefit from weight loss  Tobacco use, in remission -she quit about 9 months ago.  Does report occasional shortness of breath, and this afternoon had an episode of shortness of breath and her sats were in the upper 80s.  Will do ambulatory sats with physical therapy this afternoon   Scheduled Meds:  atorvastatin   20 mg Oral Daily   levETIRAcetam   500 mg Oral BID   metoprolol  tartrate  50 mg Oral q morning   pantoprazole  40 mg Oral Daily   spironolactone   25 mg Oral Daily   Continuous Infusions:  heparin 1,000 Units/hr (07/19/23 0700)   PRN Meds:.acetaminophen  **OR** acetaminophen  (TYLENOL ) oral liquid 160 mg/5 mL **OR** acetaminophen , melatonin, senna-docusate  Current Outpatient Medications  Medication Instructions   acetaminophen  (TYLENOL ) 1,300 mg, Oral, Daily PRN, Pain    atorvastatin  (LIPITOR) 20 MG tablet TAKE 1 TABLET BY MOUTH EVERY DAY   furosemide  (LASIX ) 40 mg, Daily   levETIRAcetam  (KEPPRA ) 500 mg, Every morning   metoprolol  tartrate (LOPRESSOR ) 50 mg, Oral, Every morning   Multiple  Vitamins-Minerals (CENTRUM SILVER ADULT 50+) TABS 1 tablet, Oral, Every morning   omeprazole  (PRILOSEC) 40 mg, Oral, Daily   potassium chloride  (KLOR-CON ) 10 MEQ tablet 20 mEq, Oral, 2 times daily   spironolactone  (ALDACTONE ) 25 mg, Oral, Daily   torsemide  (DEMADEX ) 20 mg, Oral, Daily   triamterene -hydrochlorothiazide (MAXZIDE-25) 37.5-25 MG tablet 1 tablet, Oral, Daily   warfarin (COUMADIN ) 5 MG tablet TAKE 1 TABLET BY MOUTH DAILY OR AS DIRECTED BY COUMADIN  CLINIC    Diet Orders (From admission, onward)     Start     Ordered   07/19/23 0735  Diet regular Fluid consistency: Thin  Diet effective now       Question:  Fluid consistency:  Answer:  Thin   07/19/23 0734            DVT prophylaxis:    Lab Results  Component Value Date   PLT 205 07/19/2023      Code Status: Full Code  Family Communication: Daughter present at bedside  Status is: Inpatient Remains inpatient appropriate because: Severity of illness, Home tomorrow   Level of care: Telemetry Medical  Consultants:  Neurology  Objective: Vitals:   07/19/23 0048 07/19/23 0406 07/19/23 0729 07/19/23 1223  BP: (!) 132/48 (!) 107/50 (!) 170/62 (!) 151/79  Pulse: 74 67 73 61  Resp: 18 18 18 16   Temp: 98.3 F (36.8 C) 98.3 F (36.8 C) 98.9 F (37.2 C) 98 F (36.7 C)  TempSrc: Oral Oral Oral Oral  SpO2: 94% 98% 99% 98%  Weight:      Height:        Intake/Output Summary (Last 24 hours) at 07/19/2023 1400 Last data filed at 07/19/2023 1300 Gross per 24 hour  Intake 523.35 ml  Output --  Net 523.35 ml   Wt Readings from Last 3 Encounters:  07/18/23 96.2 kg  03/28/23 96.2 kg  03/21/23 95.7 kg    Examination:  Constitutional: NAD Eyes: no scleral icterus ENMT: Mucous membranes are moist.  Neck: normal, supple Respiratory: clear to auscultation bilaterally, no wheezing, no crackles.  Cardiovascular: Irregular, no edema Abdomen: non distended, no tenderness. Bowel sounds positive.  Musculoskeletal:  no clubbing / cyanosis.   Data Reviewed: I have independently reviewed following labs and imaging studies   CBC Recent Labs  Lab 07/18/23 1047 07/18/23 1215 07/19/23 0443  WBC 8.0  --  7.3  HGB 12.1 13.3 10.5*  HCT 38.4 39.0 32.9*  PLT 228  --  205  MCV 97.5  --  96.5  MCH 30.7  --  30.8  MCHC 31.5  --  31.9  RDW 13.1  --  13.2  LYMPHSABS 1.2  --   --   MONOABS 0.6  --   --   EOSABS 0.1  --   --   BASOSABS 0.0  --   --     Recent Labs  Lab 07/15/23 1120 07/18/23 1047 07/18/23 1215 07/19/23 0443  NA  --  137 138 135  K  --  3.7 3.6 3.8  CL  --  99 101 104  CO2  --  21*  --  22  GLUCOSE  --  171* 164*  138*  BUN  --  17 19 12   CREATININE  --  1.14* 1.10* 0.97  CALCIUM   --  9.3  --  8.4*  AST  --  41  --  35  ALT  --  25  --  21  ALKPHOS  --  102  --  85  BILITOT  --  1.0  --  1.1  ALBUMIN  --  4.2  --  3.5  INR 1.8* 1.9*  --  1.8*  HGBA1C  --   --   --  5.7*    ------------------------------------------------------------------------------------------------------------------ Recent Labs    07/19/23 0443  CHOL 102  HDL 39*  LDLCALC 52  TRIG 57  CHOLHDL 2.6    Lab Results  Component Value Date   HGBA1C 5.7 (H) 07/19/2023   ------------------------------------------------------------------------------------------------------------------ No results for input(s): TSH, T4TOTAL, T3FREE, THYROIDAB in the last 72 hours.  Invalid input(s): FREET3  Cardiac Enzymes No results for input(s): CKMB, TROPONINI, MYOGLOBIN in the last 168 hours.  Invalid input(s): CK ------------------------------------------------------------------------------------------------------------------    Component Value Date/Time   BNP 207.9 (H) 01/02/2023 1655    CBG: No results for input(s): GLUCAP in the last 168 hours.  No results found for this or any previous visit (from the past 240 hours).   Radiology Studies: ECHOCARDIOGRAM COMPLETE Result Date:  07/19/2023    ECHOCARDIOGRAM REPORT   Patient Name:   JALEXIA LALLI Date of Exam: 07/19/2023 Medical Rec #:  [id number]        Height:       59.0 in Accession #:    [id number]       Weight:       212.1 lb Date of Birth:  [birth year]        BSA:          1.891 m Patient Age:    67 years         BP:           170/62 mmHg Patient Gender: F                HR:           72 bpm. Exam Location:  Inpatient Procedure: 2D Echo, Cardiac Doppler, Color Doppler and Intracardiac            Opacification Agent (Both Spectral and Color Flow Doppler were            utilized during procedure). Indications:    Stroke I63.9  History:        Patient has prior history of Echocardiogram examinations, most                 recent 01/25/2023. 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 Procedure date:                 04/22/2010, Arrythmias:Atrial Fibrillation; Risk                 Factors:Hypertension.  Sonographer:    Koleen Popper RDCS Referring Phys: [id number] MARSA NOVAK MELVIN IMPRESSIONS  1. Left ventricular ejection fraction, by estimation, is 40 to 45%. The left ventricle has mildly decreased function. The left ventricle demonstrates global hypokinesis. There is moderate concentric left ventricular hypertrophy. Left ventricular diastolic parameters are indeterminate.  2. Right ventricular systolic function is mildly reduced. The right ventricular size is normal. There is  normal pulmonary artery systolic pressure.  3. Left atrial size was severely dilated.  4. The mitral valve has been repaired/replaced. No evidence of mitral valve regurgitation. The mean mitral valve gradient is 4.3 mmHg.  5. The aortic valve is tricuspid. There is mild calcification of the aortic valve. Aortic valve regurgitation is not visualized. Aortic valve sclerosis is present, with no evidence of aortic valve stenosis.  6. The inferior vena cava is normal in size with <50% respiratory variability,  suggesting right atrial pressure of 8 mmHg. Comparison(s): RVSP has improved from prior study; there is no clear evidence of interatrial shunting. FINDINGS  Left Ventricle: Left ventricular ejection fraction, by estimation, is 40 to 45%. The left ventricle has mildly decreased function. The left ventricle demonstrates global hypokinesis. Definity contrast agent was given IV to delineate the left ventricular  endocardial borders. The left ventricular internal cavity size was normal in size. There is moderate concentric left ventricular hypertrophy. Left ventricular diastolic parameters are indeterminate. Right Ventricle: The right ventricular size is normal. No increase in right ventricular wall thickness. Right ventricular systolic function is mildly reduced. There is normal pulmonary artery systolic pressure. The tricuspid regurgitant velocity is 2.41 m/s, and with an assumed right atrial pressure of 8 mmHg, the estimated right ventricular systolic pressure is 31.2 mmHg. Left Atrium: Left atrial size was severely dilated. Right Atrium: Right atrial size was not well visualized. Pericardium: There is no evidence of pericardial effusion. Mitral Valve: The mitral valve has been repaired/replaced. No evidence of mitral valve regurgitation. MV peak gradient, 15.3 mmHg. The mean mitral valve gradient is 4.3 mmHg. Tricuspid Valve: The tricuspid valve is normal in structure. Tricuspid valve regurgitation is mild . No evidence of tricuspid stenosis. Aortic Valve: The aortic valve is tricuspid. There is mild calcification of the aortic valve. There is mild aortic valve annular calcification. Aortic valve regurgitation is not visualized. Aortic valve sclerosis is present, with no evidence of aortic valve stenosis. Pulmonic Valve: The pulmonic valve was not well visualized. Pulmonic valve regurgitation is not visualized. No evidence of pulmonic stenosis. Aorta: The aortic root and ascending aorta are structurally normal, with  no evidence of dilitation. Venous: The inferior vena cava is normal in size with less than 50% respiratory variability, suggesting right atrial pressure of 8 mmHg. IAS/Shunts: The atrial septum is grossly normal.  LEFT VENTRICLE PLAX 2D LVIDd:         4.90 cm LVIDs:         4.20 cm LV PW:         1.20 cm LV IVS:        1.30 cm LVOT diam:     1.90 cm LV SV:         40 LV SV Index:   21 LVOT Area:     2.84 cm  RIGHT VENTRICLE            IVC RV S prime:     7.24 cm/s  IVC diam: 2.10 cm TAPSE (M-mode): 1.1 cm LEFT ATRIUM              Index LA diam:        5.80 cm  3.07 cm/m LA Vol (A2C):   121.0 ml 63.99 ml/m LA Vol (A4C):   69.6 ml  36.81 ml/m LA Biplane Vol: 92.1 ml  48.71 ml/m  AORTIC VALVE LVOT Vmax:   63.70 cm/s LVOT Vmean:  46.300 cm/s LVOT VTI:    0.142 m  AORTA Ao Root diam:  2.90 cm Ao Asc diam:  3.30 cm MITRAL VALVE             TRICUSPID VALVE MV Area VTI:  1.06 cm   TR Peak grad:   23.2 mmHg MV Peak grad: 15.3 mmHg  TR Vmax:        241.00 cm/s MV Mean grad: 4.3 mmHg MV Vmax:      1.96 m/s   SHUNTS MV Vmean:     84.0 cm/s  Systemic VTI:  0.14 m                          Systemic Diam: 1.90 cm Stanly Leavens MD Electronically signed by Stanly Leavens MD Signature Date/Time: 07/19/2023/11:55:42 AM    Final    Overnight EEG with video Result Date: 07/19/2023 Shelton Arlin KIDD, MD     07/19/2023  9:03 AM Patient Name: Denessa W Sawyer MRN: [id number] Epilepsy Attending: Arlin KIDD Shelton Referring Physician/Provider: Voncile Isles, MD Duration: 07/18/2023 1653 to 07/19/2023 0900  Patient history:  68 y.o. 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 EEG to evaluate for seizure.  Level of alertness: Awake, asleep  AEDs during EEG study:  LEV  Technical aspects: This EEG study was done with scalp electrodes positioned according to the 10-20 International system of electrode placement. Electrical activity was reviewed with band pass filter of 1-70Hz , sensitivity of 7 uV/mm, display speed of 38mm/sec with a 60Hz  notched filter applied as appropriate. EEG data were recorded continuously and digitally stored. Video monitoring was available and reviewed as appropriate.  Description: The posterior dominant rhythm consists of 8-9 Hz activity of moderate voltage (25-35 uV) seen predominantly in posterior head regions, symmetric and reactive to eye opening and eye closing. Sleep was characterized by vertex waves, sleep spindles (12 to 14 Hz), maximal frontocentral region. Hyperventilation and photic stimulation were not performed.    IMPRESSION: This study is within normal limits. No seizures or epileptiform discharges were seen throughout the recording.  Priyanka O Yadav   CT Head Wo Contrast Result Date: 07/18/2023 CLINICAL DATA:  Initial evaluation for acute mental status change, unknown cause. EXAM: CT HEAD WITHOUT CONTRAST TECHNIQUE: Contiguous axial images were obtained from the base of the skull through the vertex without intravenous contrast. RADIATION DOSE REDUCTION: This exam was performed according to the departmental dose-optimization program which includes automated exposure control, adjustment of the mA and/or kV according to patient size and/or use of iterative reconstruction technique. COMPARISON:  Prior CT and MRI from earlier the same day. FINDINGS: Brain: Cerebral volume within normal limits. Chronic right MCA distribution infarct again noted. Underlying mild chronic microvascular ischemic disease. Previously identified small left parietal infarct not visible by CT. No other acute large vessel territory infarct. No acute intracranial hemorrhage. No mass lesion or midline shift. No hydrocephalus. No subdural collection by CT. Vascular: No  abnormal hyperdense vessel. Skull: Scalp soft tissues and calvarium demonstrate no new finding. Sinuses/Orbits: Globes orbital soft tissues demonstrate no acute finding. Paranasal sinuses and mastoid air cells remain largely clear. Other: None. IMPRESSION: 1. No acute intracranial abnormality. Previously identified small left parietal infarct not visible by CT. 2. Chronic right MCA distribution infarct with underlying mild chronic microvascular ischemic disease. Electronically  Signed   By: Morene Hoard M.D.   On: 07/18/2023 20:25   EEG adult Result Date: 07/18/2023 Shelton Arlin KIDD, MD     07/18/2023 10:42 PM Patient Name: Chessie W Carmickle MRN: [id number] Epilepsy Attending: Arlin KIDD Shelton Referring Physician/Provider: Voncile Isles, MD Date: 07/18/2023 Duration: 21.43 mins Patient history:  68 y.o. 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 EEG to evaluate for seizure. Level of alertness: Awake, asleep AEDs during EEG study: LEV, Ativan Technical aspects: This EEG study was done with scalp electrodes positioned according to the 10-20 International system of electrode placement. Electrical activity was reviewed with band pass filter of 1-70Hz , sensitivity of 7 uV/mm, display speed of 54mm/sec with a 60Hz  notched filter applied as appropriate. EEG data were recorded continuously and digitally stored. Video monitoring was available and reviewed as appropriate. Description: The posterior dominant rhythm consists of 8-9 Hz activity of moderate voltage (25-35 uV) seen predominantly in posterior head regions, symmetric and reactive to eye opening and eye closing. Sleep was characterized by vertex waves, sleep spindles (12 to 14 Hz), maximal  frontocentral region. EEG showed intermittent generalized 3 to 6 Hz theta-delta slowing. Hyperventilation and photic stimulation were not performed.   ABNORMALITY - Intermittent slow, generalized IMPRESSION: This study is suggestive of mild diffuse encephalopathy. No seizures or epileptiform discharges were seen throughout the recording. Arlin KIDD Shelton   DG Chest Portable 1 View Result Date: 07/18/2023 CLINICAL DATA:  Cerebrovascular accident. EXAM: PORTABLE CHEST 1 VIEW COMPARISON:  07/18/2023 FINDINGS: Prior median sternotomy.  Mitral valve prosthesis. Atherosclerotic calcification of the aortic arch. Moderate enlargement of the cardiopericardial silhouette appears unchanged. Indistinct pulmonary vasculature raising the possibility of pulmonary venous hypertension. No overt edema. No blunting of the costophrenic angles. IMPRESSION: 1. Moderate enlargement of the cardiopericardial silhouette, with indistinct pulmonary vasculature raising the possibility of pulmonary venous hypertension. No overt edema. 2. Prior median sternotomy and mitral valve prosthesis. Electronically Signed   By: Ryan Salvage M.D.   On: 07/18/2023 15:23   CT HEAD WO CONTRAST ( ) Result Date: 07/18/2023 CLINICAL DATA:  Subdural hematoma.  Seizures. EXAM: CT HEAD WITHOUT CONTRAST TECHNIQUE: Contiguous axial images were obtained from the base of the skull through the vertex without intravenous contrast. RADIATION DOSE REDUCTION: This exam was performed according to the departmental dose-optimization program which includes automated exposure control, adjustment of the mA and/or kV according to patient size and/or use of iterative reconstruction technique. COMPARISON:  Head CT and MRI 07/18/2023 FINDINGS: Brain: A subtle subcentimeter hypodensity involving left parietal cortex corresponds to the acute/subacute infarct on MRI. A large chronic right MCA infarct is again noted. Mildly prominent extra-axial low-density fluid over the  right cerebral convexity is unchanged and favored to reflect subarachnoid space enlargement from volume loss. No definite CT correlate is identified for the thin right-sided subdural hematoma which was questioned on today's earlier MRI, and as previously noted this may instead reflect dural thickening (or a thin hematoma which is CT occult). There is a background of mild to moderate chronic small vessel ischemic disease in the cerebral white matter. No interval intracranial hemorrhage, mass, or midline shift is identified. Vascular: Calcified atherosclerosis at the skull base. Skull: No fracture or suspicious lesion. Sinuses/Orbits: Visualized paranasal sinuses and mastoid air  cells are clear. Unremarkable orbits. Other: Small posterior right scalp hematoma. IMPRESSION: 1. No definite subdural hematoma by CT. 2. Known small acute/subacute left parietal cortical infarct. 3. Small posterior right scalp hematoma. 4. Large chronic right MCA infarct. Electronically Signed   By: Dasie Hamburg M.D.   On: 07/18/2023 15:13     Nilda Fendt, MD, PhD [facility]  Between 7 am - 7 pm I am available, please contact me via Amion (for emergencies) or Securechat (non urgent messages)  Between 7 pm - 7 am I am not available, please contact night coverage MD/APP via Amion

## 2023-07-19 NOTE — Progress Notes (Addendum)
 NEUROLOGY CONSULT FOLLOW UP NOTE   Date of service: July 19, 2023 Patient Name: Natasha Davis MRN:  [id number] DOB:  1955-05-14  Interval Hx/subjective  No acute changes.  Speech much better today  Vitals   Vitals:   07/18/23 2044 07/19/23 0048 07/19/23 0406 07/19/23 0729  BP: 115/89 (!) 132/48 (!) 107/50 (!) 170/62  Pulse: 83 74 67 73  Resp: 18 18 18 18   Temp: (!) 97.5 F (36.4 C) 98.3 F (36.8 C) 98.3 F (36.8 C) 98.9 F (37.2 C)  TempSrc: Oral Oral Oral Oral  SpO2: 93% 94% 98% 99%  Weight:      Height:         Body mass index is 42.84 kg/m.  Physical Exam  General: Awake alert in no distress HEENT:  AT Lungs: Clear Cardiovascular: Regular rhythm Neurological exam Awake alert oriented x 3 Mildly dysarthric speech-much improved from yesterday No aphasia Cranial nerves: Pupils equal round react light, extraocular movements intact, visual fields full, face appears symmetric, facial sensation diminished on the left, tongue and palate midline. Motor examination with no drift in any of the 4 extremities although the left is mildly weaker than the right on individual muscle testing Sensation diminished on the left in comparison to the right and today, she does not exhibit any extinction to double simultaneous simulation. Coordination: Grossly intact   Medications  Current Facility-Administered Medications:    acetaminophen  (TYLENOL ) tablet 650 mg, 650 mg, Oral, Q4H PRN **OR** acetaminophen  (TYLENOL ) 160 MG/5ML solution 650 mg, 650 mg, Per Tube, Q4H PRN **OR** acetaminophen  (TYLENOL ) suppository 650 mg, 650 mg, Rectal, Q4H PRN, Melvin, Alexander B, MD   atorvastatin  (LIPITOR) tablet 20 mg, 20 mg, Oral, Daily, Melvin, Alexander B, MD, 20 mg at 07/19/23 0935   heparin  ADULT infusion 100 units/mL (25000 units/250mL), 1,000 Units/hr, Intravenous, Continuous, Mattie Marvetta SQUIBB, RPH, Last Rate: 10 mL/hr at 07/19/23 0700, 1,000 Units/hr at 07/19/23 0700   levETIRAcetam   (KEPPRA ) tablet 500 mg, 500 mg, Oral, BID, Melvin, Alexander B, MD, 500 mg at 07/19/23 0935   melatonin tablet 6 mg, 6 mg, Oral, QHS PRN, Segars, Jonathan, MD   metoprolol  tartrate (LOPRESSOR ) tablet 50 mg, 50 mg, Oral, q morning, Melvin, Alexander B, MD, 50 mg at 07/19/23 0935   pantoprazole  (PROTONIX ) EC tablet 40 mg, 40 mg, Oral, Daily, Melvin, Alexander B, MD, 40 mg at 07/19/23 0935   senna-docusate (Senokot-S) tablet 1 tablet, 1 tablet, Oral, QHS PRN, Melvin, Alexander B, MD   spironolactone  (ALDACTONE ) tablet 25 mg, 25 mg, Oral, Daily, Seena Marsa NOVAK, MD, 25 mg at 07/19/23 0935  Labs and Diagnostic Imaging   CBC:  Recent Labs  Lab 07/18/23 1047 07/18/23 1215 07/19/23 0443  WBC 8.0  --  7.3  NEUTROABS 6.0  --   --   HGB 12.1 13.3 10.5*  HCT 38.4 39.0 32.9*  MCV 97.5  --  96.5  PLT 228  --  205    Basic Metabolic Panel:  Lab Results  Component Value Date   NA 135 07/19/2023   K 3.8 07/19/2023   CO2 22 07/19/2023   GLUCOSE 138 (H) 07/19/2023   BUN 12 07/19/2023   CREATININE 0.97 07/19/2023   CALCIUM  8.4 (L) 07/19/2023   GFRNONAA >60 07/19/2023   GFRAA 106 03/26/2016   Lipid Panel:  Lab Results  Component Value Date   LDLCALC 52 07/19/2023   HgbA1c:  Lab Results  Component Value Date   HGBA1C 5.7 (H) 07/19/2023   Urine Drug  Screen:     Component Value Date/Time   LABOPIA NONE DETECTED 07/18/2023 1238   COCAINSCRNUR NONE DETECTED 07/18/2023 1238   LABBENZ NONE DETECTED 07/18/2023 1238   AMPHETMU NONE DETECTED 07/18/2023 1238   THCU NONE DETECTED 07/18/2023 1238   LABBARB NONE DETECTED 07/18/2023 1238    Alcohol Level     Component Value Date/Time   ETH <15 07/18/2023 1050   INR  Lab Results  Component Value Date   INR 1.8 (H) 07/19/2023   APTT  Lab Results  Component Value Date   APTT 29 07/18/2023   Imaging personally reviewed as below: CT head without contrast with no acute intracranial abnormality.  CT angiography head and neck with no  LVO.  1 to 2 mm vascular protrusion from the cavernous right in the paraclinoid left ICA which may reflect aneurysm or infundibulum. MRI of the brain completed that shows small punctate subacute appearing infarct in the left parietal region as well as a sliver of a subdural hematoma versus dural thickening in the right frontoparietal region underlying the scalp hematoma.  This was not visualized on initial head CT or the follow-up head CT's that were done x 2 after the MRI. Routine EEG unremarkable    Assessment   Natasha Davis is a 68 y.o. 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  She was starting to come around and her speech was still very slurred for which a code stroke was activated.  Her speech eventually started to become better and became baseline. CT head was unremarkable-showed prior stroke encephalomalacia in the right MCA territory. MRI of the brain showed a subacute punctate left frontal cortical infarct-likely incidental MRI of the brain also revealed what was read as a small sliver of a subdural hematoma but none of the follow-up CTs or the CT prior to the MRI were able to pick that-I think that is more reflective of dural thickening than a subdural hematoma at this time.  Impression: Breakthrough seizure Punctate stroke-- incidental  Recommendations   From the perspective of the breakthrough seizure: - Continue Keppra  500 twice daily.  Although no fill history can be found, patient says she is compliant.  Since she has not had any seizures for many years before this episode, I would continue the 500 twice daily dosing for now - Maintain seizure precautions - Follow-up with neurology in 8 to 12  weeks  From the perspective of the incidental stroke seen on MRI -A1c 5.7.  Goal is less than 7.  LDL is 52, goal is less than 70. - Continue home statin atorvastatin  20 mg - Continue Coumadin  per cardiology goal - Therapy assessments -2D echo-pending - Follow-up with neurology in 8 to 12 weeks  From the perspective of previously described possible subdural hematoma-likely an artifact/versus dural thickening.  No further intervention required.  Will follow echocardiogram with you. Preliminary plan relayed to Dr. Vianne ______________________________________________________________________   Signed, Eligio Lav, MD [facility]    ADDENDUM Echo unchanged from prior - no further inpatient w/u D/W Dr Vianne

## 2023-07-19 NOTE — Evaluation (Signed)
 Speech Language Pathology Evaluation Patient Details Name: Natasha Davis MRN: [id number] DOB: Jan 31, 1955 Today's Date: 07/19/2023 Time: 8943-8893 SLP Time Calculation (min) (ACUTE ONLY): 10 min  Problem List:  Patient Active Problem List   Diagnosis Date Noted   Subdural hematoma (HCC) 07/18/2023   Seizure (HCC) 07/18/2023   Seizure disorder (HCC) 07/18/2023   History of CVA (cerebrovascular accident) 07/18/2023   Acute CVA (cerebrovascular accident) (HCC) 07/18/2023   Persistent atrial fibrillation (HCC) 03/21/2023   Hypercoagulable state due to persistent atrial fibrillation (HCC) 03/21/2023   GERD (gastroesophageal reflux disease) 08/16/2022   HTN (hypertension) 06/20/2015   Encounter for therapeutic drug monitoring 02/23/2013   S/P mitral valve replacement 07/17/2010   History of mitral valve replacement with mechanical valve 04/22/2010   Past Medical History:  Past Medical History:  Diagnosis Date   Chronic anticoagulation    Claudication (HCC)    History of CVA (cerebrovascular accident)    Hypertension    Mitral stenosis    Seizure (HCC)    HX   Past Surgical History:  Past Surgical History:  Procedure Laterality Date   CARDIAC CATHETERIZATION  06/01/96   NORMAL LEFT VENTRICULAR SYSTOLIC FUNCTION. EF 60%   CESAREAN SECTION     X2   MITRAL VALVE REPLACEMENT     HPI:  Natasha Davis is a 68 y.o. 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 Per chart her speech eventually started to become better and became baseline. MRI of the brain showed a subacute punctate left frontal cortical infarct-likely incidental. MRI of the brain also revealed what was read as a small sliver of a subdural hematoma but none of the  follow-up CTs or the CT prior to the MRI were able to pick that I think that is more reflective of dural thickening than a subdural hematoma at this time.   Assessment / Plan / Recommendation Clinical Impression  Pt demonstrated mild CN VII impairments with decreased ROM that she states is from prior stroke. Pt and son report her speech is back to baseline and no concerns with language or cognition. Informal assessment revealed appropriate language, both receptive and expressive (followed commands 100%, functional divergent naming, appropriate language in conversation). She was accurately oriented to place and time. For situation she stated one doctor said I had a seizure and one said I had a stroke. Verbal problem solving was within normal limits and she accurately performed mental calculation. For 4 word memory recall pt scored in the mildly impaired range (scoring 1 point below average range). Pt states she writes important information and places it on her frig and refers to it. She feels that she won't have difficulty managing finances or medicine. ST not recommended at this time. Educated pt if she notices difficulty once home she can obtain order for outpatient ST. Pt voiced understanding and is in agreement.    SLP Assessment  SLP Recommendation/Assessment: Patient does not need any further Speech Language Pathology Services SLP Visit Diagnosis: Cognitive communication deficit (R41.841)     Assistance Recommended at Discharge     Functional Status Assessment Patient has not had a recent decline in their functional status  Frequency and Duration           SLP Evaluation Cognition  Overall Cognitive Status:  Within Functional Limits for tasks assessed (was one point lower for word recall but overall functional- see impression statement) Arousal/Alertness: Awake/alert Orientation Level: Oriented X4 (stated on MD said I had a seizure and one MD said I had a stroke) Month: June Day of  Week: Correct Attention: Sustained Sustained Attention: Appears intact Memory: Impaired (scored a 9 on memory subtest and 10 is within functional limits) Memory Impairment: Retrieval deficit Awareness: Appears intact Problem Solving: Appears intact Safety/Judgment: Appears intact       Comprehension  Auditory Comprehension Overall Auditory Comprehension: Appears within functional limits for tasks assessed Commands: Within Functional Limits Visual Recognition/Discrimination Discrimination: Not tested Reading Comprehension Reading Status: Not tested    Expression Expression Primary Mode of Expression: Verbal Verbal Expression Overall Verbal Expression: Appears within functional limits for tasks assessed Initiation: No impairment Level of Generative/Spontaneous Verbalization: Conversation Repetition:  (NT) Naming: No impairment Pragmatics: No impairment Written Expression Written Expression: Not tested   Oral / Motor  Oral Motor/Sensory Function Overall Oral Motor/Sensory Function: Mild impairment (pt states this is baseline from prior stroke) Facial ROM: Reduced left;Suspected CN VII (facial) dysfunction Facial Symmetry: Abnormal symmetry left;Suspected CN VII (facial) dysfunction Facial Strength: Reduced left;Suspected CN VII (facial) dysfunction Lingual ROM: Within Functional Limits Lingual Symmetry: Within Functional Limits Motor Speech Overall Motor Speech: Appears within functional limits for tasks assessed Respiration: Within functional limits Phonation: Normal Resonance: Within functional limits Articulation: Within functional limitis Intelligibility: Intelligible Motor Planning: Within functional limits Motor Speech Errors: Not applicable            Natasha Davis 07/19/2023, 12:05 PM

## 2023-07-19 NOTE — Progress Notes (Signed)
 LTM VIDEO EEG discontinued - no skin breakdown at [facility]

## 2023-07-19 NOTE — Plan of Care (Signed)
  Problem: Nutrition: Goal: Risk of aspiration will decrease Outcome: Progressing Goal: Dietary intake will improve Outcome: Progressing   Problem: Self-Care: Goal: Ability to participate in self-care as condition permits will improve Outcome: Progressing Goal: Verbalization of feelings and concerns over difficulty with self-care will improve Outcome: Progressing Goal: Ability to communicate needs accurately will improve Outcome: Progressing   Problem: Clinical Measurements: Goal: Ability to maintain clinical measurements within normal limits will improve Outcome: Progressing Goal: Will remain free from infection Outcome: Progressing Goal: Diagnostic test results will improve Outcome: Progressing Goal: Respiratory complications will improve Outcome: Progressing Goal: Cardiovascular complication will be avoided Outcome: Progressing   Problem: Activity: Goal: Risk for activity intolerance will decrease Outcome: Progressing   Problem: Pain Managment: Goal: General experience of comfort will improve and/or be controlled Outcome: Progressing   Problem: Elimination: Goal: Will not experience complications related to bowel motility Outcome: Progressing Goal: Will not experience complications related to urinary retention Outcome: Progressing   Problem: Safety: Goal: Ability to remain free from injury will improve Outcome: Progressing   Problem: Pain Managment: Goal: General experience of comfort will improve and/or be controlled Outcome: Progressing   Problem: Skin Integrity: Goal: Risk for impaired skin integrity will decrease Outcome: Progressing

## 2023-07-19 NOTE — Evaluation (Addendum)
 Physical Therapy Brief Evaluation and Discharge Note Patient Details Name: Natasha Davis MRN: [id number] DOB: November 03, 1955 Today's Date: 07/19/2023   History of Present Illness  Pt is a 68 y/o female presenting on 6/23 after fall and seizure with L sided weakness. CT/CTA negative, MRI  with acute vs subacute CVA in L parietal lobe, incidentally noted small subdural hematoma along R frontoparietal convexity. PMH includes: CVA, HTN, seizure.  Clinical Impression  Patient evaluated by Physical Therapy with no further acute PT needs identified. Pt appears to be close to her functional baseline. Pt ambulating 500 ft with no assistive device; able to complete high level balance activities without issue. Pt with slightly decreased cardiopulmonary endurance; she states she is a limited community ambulator. Encouraged 30 minutes of moderate activity 5 days/wk. Pt and pt daughter verbalized understanding. All education has been completed and the patient has no further questions. No follow-up Physical Therapy or equipment needs. PT is signing off. Thank you for this referral.      PT Assessment Patient does not need any further PT services  Assistance Needed at Discharge  None    Equipment Recommendations None recommended by PT  Recommendations for Other Services       Precautions/Restrictions Precautions Precautions: Fall Recall of Precautions/Restrictions: Intact Restrictions Weight Bearing Restrictions Per Provider Order: No        Mobility  Bed Mobility     Sit to supine/sidelying: Independent    Transfers Overall transfer level: Independent Equipment used: None                    Ambulation/Gait Ambulation/Gait assistance: Modified independent (Device/Increase time) Gait Distance (Feet): 500 Feet Assistive device: None Gait Pattern/deviations: Step-through pattern, Decreased stride length Gait Speed: Below normal General Gait Details: Increased lateral sway, but  overall no gross unsteadiness. Fair pace and good posture throughout. Able to perform head turns, steppin over/around obstacles without loss of balance  Home Activity Instructions    Stairs            Modified Rankin (Stroke Patients Only) Modified Rankin (Stroke Patients Only) Pre-Morbid Rankin Score: No significant disability Modified Rankin: No significant disability      Balance Overall balance assessment: Mild deficits observed, not formally tested                        Pertinent Vitals/Pain PT - Brief Vital Signs All Vital Signs Stable: Yes Pain Assessment Pain Assessment: Faces Faces Pain Scale: No hurt     Home Living Family/patient expects to be discharged to:: Private residence Living Arrangements: Other (Comment);Other relatives (grandson)   Home Environment: Level entry   Home Equipment: Cane - single point        Prior Function Level of Independence: Independent      UE/LE Assessment   UE ROM/Strength/Tone/Coordination: Impaired UE ROM/Strength/Tone/Coordination Deficits: Decreased LUE coordination at baseline from prior stroke  LE ROM/Strength/Tone/Coordination: Impaired LE ROM/Strength/Tone/Coordination Deficits: Decreased LLE coordination at baseline from prior stroke    Communication   Communication Communication: No apparent difficulties     Cognition Overall Cognitive Status: Appears within functional limits for tasks assessed/performed       General Comments      Exercises     Assessment/Plan    PT Problem List         PT Visit Diagnosis Unsteadiness on feet (R26.81)    No Skilled PT All education completed;Patient at baseline level of functioning;Patient is independent with  all acitivity/mobility   Co-evaluation                AMPAC 6 Clicks Help needed turning from your back to your side while in a flat bed without using bedrails?: None Help needed moving from lying on your back to sitting on the  side of a flat bed without using bedrails?: None Help needed moving to and from a bed to a chair (including a wheelchair)?: None Help needed standing up from a chair using your arms (e.g., wheelchair or bedside chair)?: None Help needed to walk in hospital room?: None Help needed climbing 3-5 steps with a railing? : None 6 Click Score: 24      End of Session   Activity Tolerance: Patient tolerated treatment well Patient left: in bed;with call bell/phone within reach;with family/visitor present Nurse Communication: Mobility status PT Visit Diagnosis: Unsteadiness on feet (R26.81)     Time: 8658-8591 PT Time Calculation (min) (ACUTE ONLY): 27 min  Charges:   PT Evaluation $PT Eval Low Complexity: 1 Low PT Treatments $Therapeutic Activity: 8-22 mins    Aleck Daring, PT, DPT Acute Rehabilitation Services Office [phone]   Alayne ONEIDA Daring  07/19/2023, 3:15 PM

## 2023-07-19 NOTE — TOC CAGE-AID Note (Signed)
 Transition of Care [facility]) - CAGE-AID Screening   Patient Details  Name: Natasha Davis MRN: [id number] Date of Birth: 1956/01/17  Transition of [facility]) CM/SW Contact:    Daiveon Markman E Tierney Behl, LCSW Phone Number: 07/19/2023, 10:04 AM   Clinical Narrative:    CAGE-AID Screening:    Have You Ever Felt You Ought to Cut Down on Your Drinking or Drug Use?: No Have People Annoyed You By [employer] Your Drinking Or Drug Use?: No Have You Felt Bad Or Guilty About Your Drinking Or Drug Use?: No Have You Ever Had a Drink or Used Drugs First Thing In The Morning to Steady Your Nerves or to Get Rid of a Hangover?: No CAGE-AID Score: 0  Substance Abuse Education Offered: No

## 2023-07-19 NOTE — Plan of Care (Signed)
  Problem: Ischemic Stroke/TIA Tissue Perfusion: Goal: Complications of ischemic stroke/TIA will be minimized 07/19/2023 0600 by Gaetana Randall Mathew GORMAN, RN Outcome: Progressing 07/19/2023 0503 by Gaetana Randall Mathew GORMAN, RN Outcome: Progressing   Problem: Education: Goal: Knowledge of disease or condition will improve 07/19/2023 0600 by Gaetana Randall Mathew GORMAN, RN Outcome: Progressing 07/19/2023 0503 by Gaetana Randall Mathew GORMAN, RN Outcome: Progressing Goal: Knowledge of secondary prevention will improve (MUST DOCUMENT ALL) 07/19/2023 0600 by Gaetana Randall Mathew GORMAN, RN Outcome: Progressing 07/19/2023 0503 by Gaetana Randall Mathew GORMAN, RN Outcome: Progressing Goal: Knowledge of patient specific risk factors will improve (DELETE if not current risk factor) 07/19/2023 0600 by Gaetana Randall Mathew GORMAN, RN Outcome: Progressing 07/19/2023 0503 by Gaetana Randall Mathew GORMAN, RN Outcome: Progressing   Problem: Coping: Goal: Will verbalize positive feelings about self 07/19/2023 0600 by Gaetana Randall Mathew GORMAN, RN Outcome: Progressing 07/19/2023 0503 by Gaetana Randall Mathew GORMAN, RN Outcome: Progressing Goal: Will identify appropriate support needs 07/19/2023 0600 by Gaetana Randall Mathew GORMAN, RN Outcome: Progressing 07/19/2023 0503 by Gaetana Randall Mathew GORMAN, RN Outcome: Progressing

## 2023-07-19 NOTE — Evaluation (Signed)
 Occupational Therapy Evaluation Patient Details Name: Natasha Davis MRN: [id number] DOB: [birth year] Today's Date: 07/19/2023   History of Present Illness   Pt is a 68 y/o female presenting on 6/23 after fall and seizure with L sided weakness. CT/CTA negative, MRI  with acute vs subacute CVA in L parietal lobe, incidentally noted small subdural hematoma along R frontoparietal convexity. PMH includes: CVA, HTN, seizure.     Clinical Impressions PTA patient reports independent with ADLs, mobility and IADLs (not driving). Admitted for above and presents with problem list below.  She is able to manage transfers and limited mobility in room with contact guard assist, Adls with up to min assist.  She scored 10/28 on short blessed test, demonstrating difficulties with short term memory, sequencing and attention- will benefit from pill box test to further assess functional cognition.  Noted L sided coordination deficits, peripheral vision deficits but pt reports this is baseline since prior CVA.  Overall, anticipate pt to progress well.  Based on performance today, pt will best benefit from continued OT services acutely and after dc at outpatient OT in order to optimize safety, independence and return to PLOF.     If plan is discharge home, recommend the following:   A little help with walking and/or transfers;A little help with bathing/dressing/bathroom;Assistance with cooking/housework;Direct supervision/assist for medications management;Direct supervision/assist for financial management;Assist for transportation;Help with stairs or ramp for entrance     Functional Status Assessment   Patient has had a recent decline in their functional status and demonstrates the ability to make significant improvements in function in a reasonable and predictable amount of time.     Equipment Recommendations   BSC/3in1     Recommendations for Other Services         Precautions/Restrictions    Precautions Precautions: Fall;Other (comment) Recall of Precautions/Restrictions: Intact Precaution/Restrictions Comments: EEG Restrictions Weight Bearing Restrictions Per Provider Order: No     Mobility Bed Mobility Overal bed mobility: Modified Independent                  Transfers Overall transfer level: Needs assistance   Transfers: Sit to/from Stand Sit to Stand: Contact guard assist           General transfer comment: for safety, line mgmt      Balance Overall balance assessment: Mild deficits observed, not formally tested                                         ADL either performed or assessed with clinical judgement   ADL Overall ADL's : Needs assistance/impaired     Grooming: Contact guard assist;Wash/dry hands;Standing           Upper Body Dressing : Sitting;Minimal assistance   Lower Body Dressing: Minimal assistance;Sit to/from stand   Toilet Transfer: Contact guard assist;Ambulation           Functional mobility during ADLs: Contact guard assist       Vision Baseline Vision/History: 1 Wears glasses Ability to See in Adequate Light: 0 Adequate Patient Visual Report: No change from baseline Vision Assessment?: Yes Eye Alignment: Within Functional Limits Ocular Range of Motion: Within Functional Limits Alignment/Gaze Preference: Head tilt Tracking/Visual Pursuits: Decreased smoothness of horizontal tracking Saccades: Within functional limits Visual Fields: Other (comment) (peripheral vision difficulties bilaterally) Additional Comments: pt reports some vision changes since prior CVA but unable to report deficits  at this time, she also reports cataracts     Perception         Praxis         Pertinent Vitals/Pain Pain Assessment Pain Assessment: No/denies pain     Extremity/Trunk Assessment Upper Extremity Assessment Upper Extremity Assessment: Right hand dominant;LUE deficits/detail;Generalized  weakness LUE Deficits / Details: L UE hx of decreased coordinatoin from prior CVA LUE Coordination: decreased fine motor;decreased gross motor   Lower Extremity Assessment Lower Extremity Assessment: Defer to PT evaluation   Cervical / Trunk Assessment Cervical / Trunk Assessment: Other exceptions Cervical / Trunk Exceptions: incresaed body habitus   Communication Communication Communication: No apparent difficulties   Cognition Arousal: Alert Behavior During Therapy: WFL for tasks assessed/performed Cognition: Cognition impaired     Awareness: Online awareness impaired Memory impairment (select all impairments): Short-term memory, Working memory Attention impairment (select first level of impairment): Sustained attention Executive functioning impairment (select all impairments): Sequencing, Organization, Problem solving OT - Cognition Comments: oriented but reports difficulty recalling events.  She scored 10/28 on short blessed test, difficulty with recall, sequencing and attention.  Patient reports difficulty with recalling name/address bc she didn't care about remember it (even when therapist asked her to try to remember it).  Son reports cognition is at baseline.  Pt will benefit from further functional cognitive testing using pill box test.                 Following commands: Intact       Cueing  General Comments   Cueing Techniques: Verbal cues;Visual cues  son present and supportive   Exercises     Shoulder Instructions      Home Living Family/patient expects to be discharged to:: Private residence Living Arrangements: Other (Comment);Other relatives (grandson) Available Help at Discharge: Family;Available PRN/intermittently Type of Home: House Home Access: Level entry     Home Layout: One level     Bathroom Shower/Tub: Chief Strategy Officer: Standard     Home Equipment: Cane - single point          Prior  Functioning/Environment Prior Level of Function : Independent/Modified Independent               ADLs Comments: manages ADLs, IADLs (meds but does not drive)    OT Problem List: Decreased strength;Decreased activity tolerance;Impaired balance (sitting and/or standing);Decreased cognition;Decreased safety awareness;Obesity;Decreased knowledge of precautions;Decreased knowledge of use of DME or AE;Decreased coordination;Impaired vision/perception   OT Treatment/Interventions: Self-care/ADL training;Therapeutic exercise;DME and/or AE instruction;Therapeutic activities;Patient/family education;Balance training;Cognitive remediation/compensation;Energy conservation;Visual/perceptual remediation/compensation      OT Goals(Current goals can be found in the care plan section)   Acute Rehab OT Goals Patient Stated Goal: get better OT Goal Formulation: With patient Time For Goal Achievement: 08/02/23 Potential to Achieve Goals: Good   OT Frequency:  Min 2X/week    Co-evaluation              AM-PAC OT 6 Clicks Daily Activity     Outcome Measure Help from another person eating meals?: A Little Help from another person taking care of personal grooming?: A Little Help from another person toileting, which includes using toliet, bedpan, or urinal?: A Little Help from another person bathing (including washing, rinsing, drying)?: A Little Help from another person to put on and taking off regular upper body clothing?: A Little Help from another person to put on and taking off regular lower body clothing?: A Little 6 Click Score: 18   End of Session  Nurse Communication: Mobility status  Activity Tolerance: Patient tolerated treatment well Patient left: in bed;with call bell/phone within reach;with bed alarm set;Other (comment) (ECHO present)  OT Visit Diagnosis: Other abnormalities of gait and mobility (R26.89);Other symptoms and signs involving cognitive function                 Time: 1030-1049 OT Time Calculation (min): 19 min Charges:  OT General Charges $OT Visit: 1 Visit OT Evaluation $OT Eval Moderate Complexity: 1 Mod  Etta NOVAK, OT Acute Rehabilitation Services Office [phone] Secure Chat Preferred    Etta GORMAN Hope 07/19/2023, 11:54 AM

## 2023-07-19 NOTE — Progress Notes (Signed)
 Admission Notes:  1930H - Received patient from the ED via stretcher. Accompanied by RN. Alert and oriented. On Room air. With on-going Heparin drip at 67ml/hr on right forearm.No complains of pain. Safety precautions initiated. Bed wheels locked, side rails up and call bell within reach. Hooked to telebox #[id number].

## 2023-07-19 NOTE — Progress Notes (Signed)
  Echocardiogram 2D Echocardiogram has been performed.  Koleen KANDICE Popper, RDCS 07/19/2023, 11:35 AM

## 2023-07-19 NOTE — Progress Notes (Signed)
 vLTM maintenance  All impedances below 10kohms.  No skin breakdown noted at FP1  FP2  A1  A2

## 2023-07-19 NOTE — Procedures (Signed)
 Patient Name: Natasha Davis  MRN: [id number]  Epilepsy Attending: Arlin MALVA Krebs  Referring Physician/Provider: Voncile Isles, MD  Duration: 07/18/2023 1653 to 07/19/2023 1200   Patient history:  68 y.o. 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 EEG to evaluate for seizure.   Level of alertness: Awake, asleep   AEDs during EEG study: LEV   Technical aspects: This EEG study was done with scalp electrodes positioned according to the 10-20 International system of electrode placement. Electrical activity was reviewed with band pass filter of 1-70Hz , sensitivity of 7 uV/mm, display speed of 69mm/sec with a 60Hz  notched filter applied as appropriate. EEG data were recorded continuously and digitally stored. Video monitoring was available and reviewed as appropriate.   Description: The posterior dominant rhythm consists of 8-9 Hz activity of moderate voltage (25-35 uV) seen predominantly in posterior head regions, symmetric and reactive to eye opening and eye closing. Sleep was characterized by vertex waves, sleep spindles (12 to 14 Hz), maximal frontocentral region. Hyperventilation and photic stimulation were not performed.      IMPRESSION: This study is within normal limits. No seizures or epileptiform discharges were seen throughout the recording.   Shahrukh Pasch O Ritta Hammes

## 2023-07-19 NOTE — Progress Notes (Signed)
 Pharmacy Consult for Heparin  Indication: MVR  Allergies  Allergen Reactions   Penicillins Other (See Comments)    Couldn't walk    Patient Measurements: Height: 4' 11 (149.9 cm) Weight: 96.2 kg (212 lb 1.3 oz) IBW/kg (Calculated) : 43.2 HEPARIN DW (KG): 66.7  Vital Signs: Temp: 98.9 F (37.2 C) (06/24 0729) Temp Source: Oral (06/24 0729) BP: 170/62 (06/24 0729) Pulse Rate: 73 (06/24 0729)  Labs: Recent Labs    07/18/23 1047 07/18/23 1215 07/19/23 0110 07/19/23 0443 07/19/23 0939  HGB 12.1 13.3  --  10.5*  --   HCT 38.4 39.0  --  32.9*  --   PLT 228  --   --  205  --   APTT 29  --   --   --   --   LABPROT 22.0*  --   --  21.4*  --   INR 1.9*  --   --  1.8*  --   HEPARINUNFRC  --   --  0.14*  --  0.30  CREATININE 1.14* 1.10*  --  0.97  --     Estimated Creatinine Clearance: 55.6 mL/min (by C-G formula based on SCr of 0.97 mg/dL).  Assessment: Natasha Davis a 69 y.o. female presented with SDH. 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 Pharmacy has been consulted for heparin dosing.   Anticoagulation PTA: Warfarin 5 mg daily   6/24: Heparin level on very low end of therapeutic range at 0.30 wit heparin running at 1,000 units/hour. Will maintain at current rate until time of lovenox transition to avoid pt being awoken in the middle of the evening. HGb 13.3>>10.5 in setting of SDH. Per neuro, OK to continue warfarin per cardiology goal- 2.5-3.5 per outpt documentation. Will provide dosing higher than PTA for now given subtherapeutic INR at admission, 1.8 today.   Goal of Therapy:  INR 2.5-3.5  Monitor platelets by anticoagulation protocol: Yes   Plan:  STOP heparin  START lovenox 100 mg Q12H- administer the first dose at the same time the heparin infusion is stopped ( 6/24 1800)  Warfarin 7.5 mg x1  CBC, INR daily   Massie Fila, PharmD Clinical Pharmacist  07/19/2023 10:51 AM

## 2023-07-20 ENCOUNTER — Other Ambulatory Visit (HOSPITAL_COMMUNITY): Payer: Self-pay

## 2023-07-20 DIAGNOSIS — I639 Cerebral infarction, unspecified: Secondary | ICD-10-CM | POA: Diagnosis not present

## 2023-07-20 LAB — PROTIME-INR
INR: 2.1 — ABNORMAL HIGH (ref 0.8–1.2)
Prothrombin Time: 23.5 s — ABNORMAL HIGH (ref 11.4–15.2)

## 2023-07-20 LAB — CBC
HCT: 32.7 % — ABNORMAL LOW (ref 36.0–46.0)
Hemoglobin: 10.3 g/dL — ABNORMAL LOW (ref 12.0–15.0)
MCH: 30.6 pg (ref 26.0–34.0)
MCHC: 31.5 g/dL (ref 30.0–36.0)
MCV: 97 fL (ref 80.0–100.0)
Platelets: 201 10*3/uL (ref 150–400)
RBC: 3.37 MIL/uL — ABNORMAL LOW (ref 3.87–5.11)
RDW: 13.2 % (ref 11.5–15.5)
WBC: 5.5 10*3/uL (ref 4.0–10.5)
nRBC: 0 % (ref 0.0–0.2)

## 2023-07-20 MED ORDER — LEVETIRACETAM 500 MG PO TABS
500.0000 mg | ORAL_TABLET | Freq: Two times a day (BID) | ORAL | 0 refills | Status: DC
  Filled 2023-07-20: qty 180, 90d supply, fill #0

## 2023-07-20 MED ORDER — ENOXAPARIN SODIUM 100 MG/ML IJ SOSY
100.0000 mg | PREFILLED_SYRINGE | Freq: Two times a day (BID) | INTRAMUSCULAR | 0 refills | Status: DC
  Filled 2023-07-20: qty 14, 7d supply, fill #0

## 2023-07-20 MED ORDER — WARFARIN SODIUM 7.5 MG PO TABS: 7.5000 mg | ORAL_TABLET | Freq: Once | ORAL | Status: DC

## 2023-07-20 NOTE — Progress Notes (Signed)
    Durable Medical Equipment  (From admission, onward)           Start     Ordered   07/20/23 1013  For home use only DME Bedside commode  Once       Question:  Patient needs a bedside commode to treat with the following condition  Answer:  Weakness   07/20/23 1012             BSC:  The patient is confined to one level of the home environment and there is no toilet on the that level of the home.

## 2023-07-20 NOTE — Plan of Care (Signed)
  Problem: Health Behavior/Discharge Planning: Goal: Ability to manage health-related needs will improve Outcome: Progressing Goal: Goals will be collaboratively established with patient/family Outcome: Progressing   Problem: Self-Care: Goal: Ability to participate in self-care as condition permits will improve Outcome: Progressing Goal: Verbalization of feelings and concerns over difficulty with self-care will improve Outcome: Progressing Goal: Ability to communicate needs accurately will improve Outcome: Progressing   Problem: Nutrition: Goal: Risk of aspiration will decrease Outcome: Progressing Goal: Dietary intake will improve Outcome: Progressing   Problem: Clinical Measurements: Goal: Ability to maintain clinical measurements within normal limits will improve Outcome: Progressing Goal: Will remain free from infection Outcome: Progressing Goal: Diagnostic test results will improve Outcome: Progressing Goal: Respiratory complications will improve Outcome: Progressing Goal: Cardiovascular complication will be avoided Outcome: Progressing   Problem: Activity: Goal: Risk for activity intolerance will decrease Outcome: Progressing   Problem: Elimination: Goal: Will not experience complications related to bowel motility Outcome: Progressing Goal: Will not experience complications related to urinary retention Outcome: Progressing   Problem: Safety: Goal: Ability to remain free from injury will improve Outcome: Progressing   Problem: Skin Integrity: Goal: Risk for impaired skin integrity will decrease Outcome: Progressing   Problem: Pain Managment: Goal: General experience of comfort will improve and/or be controlled Outcome: Progressing

## 2023-07-20 NOTE — Progress Notes (Signed)
 Occupational Therapy Treatment Patient Details Name: Natasha Davis MRN: [id number] DOB: [birth year] Today's Date: 07/20/2023   History of present illness Pt is a 68 y/o female presenting on 6/23 after fall and seizure with L sided weakness. CT/CTA negative, MRI  with acute vs subacute CVA in L parietal lobe, incidentally noted small subdural hematoma along R frontoparietal convexity. PMH includes: CVA, HTN, seizure.   OT comments  Patient in recliner and agreeable to OT session.  Functional cognition further assessed with The Pillbox Test: A Measure of Executive Functioning and Estimate of Medication Management. A straight pass/fail designation is determined by 3 or more errors of omission or misplacement on the task. Pt had a total of 31 errors.and failed the assessment, demonstrating deficits with planning, mental flexibility, suboptimal search strategies, concrete thinking and difficulty with multitasking. Patient able to correct errors when therapist provided minimal cueing with mistakes, but is unable to locate errors on her own.  She reports the task was challenging.  Discussed how this applies to IADL engagement, and she agrees with recommendations for having a friend assist with pill box, as well as having assist for cooking (if not simple microwave or cold meals).  Will follow acutely, continue to recommend outpatient OT.          If plan is discharge home, recommend the following:  A little help with walking and/or transfers;A little help with bathing/dressing/bathroom;Assistance with cooking/housework;Direct supervision/assist for medications management;Direct supervision/assist for financial management;Assist for transportation;Help with stairs or ramp for entrance   Equipment Recommendations  BSC/3in1    Recommendations for Other Services      Precautions / Restrictions Precautions Precautions: Fall Recall of Precautions/Restrictions: Intact Restrictions Weight Bearing  Restrictions Per Provider Order: No       Mobility Bed Mobility               General bed mobility comments: OOB in reclier    Transfers                         Balance                                           ADL either performed or assessed with clinical judgement   ADL                                         General ADL Comments: focused on pill box test    Extremity/Trunk Assessment              Vision       Perception     Praxis     Communication Communication Communication: No apparent difficulties   Cognition Arousal: Alert Behavior During Therapy: WFL for tasks assessed/performed Cognition: Cognition impaired     Awareness: Online awareness impaired Memory impairment (select all impairments): Short-term memory, Working memory Attention impairment (select first level of impairment): Sustained attention Executive functioning impairment (select all impairments): Sequencing, Organization, Problem solving OT - Cognition Comments: patient engaged in pill box test    Functional cognition further assessed with The Pillbox Test: A Measure of Executive Functioning and Estimate of Medication Management. A straight pass/fail designation is determined by 3 or more errors of omission or misplacement on the task. Pt had  a total of 31 errors.and failed the assessment, demonstrating deficits with planning, mental flexibility, suboptimal search strategies, concrete thinking and difficulty with multitasking.  Errors: One tablet 3x/day - 14 errors (omission/misplacement) One tablet 2x/day with breakfast and dinner - 7 errors (omission/misplacement) One tablet in the morning - 0 errors (omission/misplacement) One tablet daily at bedtime  - 7 errors(omission/misplacement) One tablet every other day - 3 errors (omission/misplacement)  Pt required cueing to fully read labels initially, and even though reading labels prior  to opening pill bottles she placed all pills in AM container without following the directions.  When cued she was able to problem solve where they medications would go.      Total time to complete task  - 4 min             Following commands: Intact        Cueing   Cueing Techniques: Verbal cues, Visual cues  Exercises      Shoulder Instructions       General Comments discussed safety and recommendations with assist for med mgmt and IADls at this time.  If preparing meals, choosing quick meals or microwave meals, avoiding stove/oven- pt agreeable.  She has a friend who can assist with med mgmt using pill box.       Pertinent Vitals/ Pain       Pain Assessment Pain Assessment: Faces Faces Pain Scale: No hurt  Home Living                                          Prior Functioning/Environment              Frequency  Min 2X/week        Progress Toward Goals  OT Goals(current goals can now be found in the care plan section)  Progress towards OT goals: Progressing toward goals  Acute Rehab OT Goals Patient Stated Goal: get better OT Goal Formulation: With patient Time For Goal Achievement: 08/02/23 Potential to Achieve Goals: Good  Plan      Co-evaluation                 AM-PAC OT 6 Clicks Daily Activity     Outcome Measure   Help from another person eating meals?: None Help from another person taking care of personal grooming?: None Help from another person toileting, which includes using toliet, bedpan, or urinal?: A Little Help from another person bathing (including washing, rinsing, drying)?: A Little Help from another person to put on and taking off regular upper body clothing?: A Little Help from another person to put on and taking off regular lower body clothing?: A Little 6 Click Score: 20    End of Session    OT Visit Diagnosis: Other abnormalities of gait and mobility (R26.89);Other symptoms and signs  involving cognitive function   Activity Tolerance Patient tolerated treatment well   Patient Left in chair;with call bell/phone within reach   Nurse Communication Mobility status        Time: 9068-9052 OT Time Calculation (min): 16 min  Charges: OT General Charges $OT Visit: 1 Visit OT Treatments $Self Care/Home Management : 8-22 mins  Etta NOVAK, OT Acute Rehabilitation Services Office [phone] Secure Chat Preferred    Etta GORMAN Hope 07/20/2023, 12:10 PM

## 2023-07-20 NOTE — TOC Transition Note (Signed)
 Transition of Care [facility]) - Discharge Note   Patient Details  Name: Natasha Davis MRN: [id number] Date of Birth: 1955-09-18  [facility] CM/SW Contact:  Andrez JULIANNA George, RN Phone Number: 07/20/2023, 10:35 AM   Clinical Narrative:     Pt is discharging home with outpatient therapy through [facility]. Information on the AVS. Pt will call to schedule the first appointment.  BSC delivered to the room through [employer].  Pt has a friend that's a Charity fundraiser that will give her the lovenox shots and fill her pill box.  She lives with grandson that works at night. Says her daughter and sister live close by and can check on her.  Sister provides needed transportation.  Final next level of care: OP Rehab Barriers to Discharge: No Barriers Identified   Patient Goals and CMS Choice   CMS Medicare.gov Compare Post Acute Care list provided to:: Patient Choice offered to / list presented to : Patient      Discharge Placement                       Discharge Plan and Services Additional resources added to the After Visit Summary for                  DME Arranged: Bedside commode DME Agency: [employer] Date DME Agency Contacted: 07/20/23   Representative spoke with at DME Agency: dc lounge            Social Drivers of Health (SDOH) Interventions SDOH Screenings   Food Insecurity: No Food Insecurity (07/20/2023)  Housing: Low Risk  (07/20/2023)  Transportation Needs: No Transportation Needs (07/20/2023)  Utilities: Not At Risk (07/20/2023)  Social Connections: Moderately Integrated (07/20/2023)  Tobacco Use: Medium Risk (03/28/2023)     Readmission Risk Interventions     No data to display

## 2023-07-20 NOTE — Progress Notes (Signed)
 Explained discharge instructions to patient. Reviewed follow up appointment and next medication administration times. Also reviewed education. Patient verbalized having an understanding for instructions given. All belongings are in the patient's possession to include the patient's bedside commode. Will pick up patient's TOC meds on the way to the discharge lounge to await her ride. IV and telemetry were removed. CCMD was notified. No other needs verbalized. Will transport downstairs to discharge lounge.

## 2023-07-20 NOTE — Discharge Summary (Signed)
 Physician Discharge Summary   Patient: TAMMARA MASSING MRN: [id number] DOB: July 09, 1955  Admit date:     07/18/2023  Discharge date: 07/20/23  Discharge Physician: Lonni SHAUNNA Dalton   PCP: Marvine Rush, MD     Recommendations at discharge:  Follow up with PCP Dr. Marvine in 1 week for seizure Referral to [facility] Neurology sent Dr. Marvine: Please reconcile if the patient should be on torsemide  or furosemide  or not Please confirm she has follow up with [facility] Please refill Keppra  500 BID  Follow up with Cardiology for INR check on Friday or Monday Continue Lovenox bridge until instructed to stop     Discharge Diagnoses: Principal Problem:   Seizure Active Problems:   Acute CVA   History of mitral valve replacement with mechanical valve   HTN (hypertension)   GERD (gastroesophageal reflux disease)   Persistent atrial fibrillation (HCC)   Subdural hematoma ruled out   Morbid obesity, Class III   Chronic systolic congestive heart failure   Hospital Course: 68 year old F with morbid obesity, hypertension, sCHF EF 40-45%, persistent atrial fibrillation on warfarin, history of mechanical mitral valve, epilepsy, and prior CVA who presented with seizure.  In the ER she was somewhat confused, had dysarthria and left-sided weakness.    Breakthrough seizure History of seizure disorder Patient was in her kitchen cooking, suddenly fell on the floor and had 1 minute of seizure-like activity.  In the hospital, neurology were consulted, changed Keppra  to 500 twice daily.  There was a question of compliance because no pharmacy fill records could be found, but patient reported good compliance.  She states that her last seizure was more than a decade ago.  Given long-term stability, only single breakthrough seizure, neurology recommended maintenance of Keppra  500 twice daily.    Acute subcentimeter cortical infarct left parietal lobe MRI on admission showed a 5 mm  acute or subacute cortical infarct in the left parietal lobe.  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  Echocardiogram showed EF 40 to 45%, no change.  LDL 52, A1c 5.7%.  Carotid imaging unremarkable.    Neurology recommended continuation of warfarin rather than aspirin for her known atrial fibrillation.  tPA not given due to rapidly resolving symptoms.  Dysphagia screen ordered in the ER, PT recommended no follow-up. Smoking cessation recommended, teaching provided.       Subdural hematoma unlikely There was some initial concern for subdural hematoma on MRI, but repeat CT ruled this out, given stability, absence of visualization on CT, strongly suspect that this is just dural thickening.  Permanent atrial fibrillation Mechanical mitral valve INR subtherapeutic Discharged on warfarin home dose, Lovenox bridge.  Recommend INR in 3-5 days.               The [place]  Controlled Substances Registry was reviewed for this patient prior to discharge.  Consultants: Neurology   Disposition: Home Diet recommendation:  Discharge Diet Orders (From admission, onward)     Start     Ordered   07/20/23 0000  Diet - low sodium heart healthy        07/20/23 0926             DISCHARGE MEDICATION: Allergies as of 07/20/2023       Reactions   Penicillins Other (See Comments)   Couldn't walk        Medication List     PAUSE  taking these medications    furosemide  40 MG tablet Wait to take this until your doctor or other care provider tells you to start again. Commonly known as: LASIX  Take 40 mg by mouth daily.   torsemide  20 MG tablet Wait to take this until your doctor or other care provider tells you to start again. Commonly known as: DEMADEX  Take 1 tablet (20 mg total) by mouth daily.       TAKE these medications    acetaminophen  650 MG CR  tablet Commonly known as: TYLENOL  Take 1,300 mg by mouth daily as needed for pain. Pain   atorvastatin  20 MG tablet Commonly known as: LIPITOR TAKE 1 TABLET BY MOUTH EVERY DAY What changed: when to take this   Centrum Silver Adult 50+ Tabs Take 1 tablet by mouth in the morning.   enoxaparin 100 MG/ML injection Commonly known as: LOVENOX Inject 1 mL (100 mg total) into the skin every 12 (twelve) hours.   levETIRAcetam  500 MG tablet Commonly known as: KEPPRA  Take 1 tablet (500 mg total) by mouth 2 (two) times daily. What changed: when to take this   metoprolol  tartrate 50 MG tablet Commonly known as: LOPRESSOR  TAKE 1 TABLET BY MOUTH IN THE  MORNING   omeprazole  20 MG capsule Commonly known as: PRILOSEC TAKE 2 CAPSULES BY MOUTH EVERY DAY What changed:  when to take this reasons to take this   potassium chloride  10 MEQ tablet Commonly known as: KLOR-CON  Take 2 tablets (20 mEq total) by mouth 2 (two) times daily.   spironolactone  25 MG tablet Commonly known as: ALDACTONE  Take 1 tablet (25 mg total) by mouth daily.   triamterene -hydrochlorothiazide 37.5-25 MG tablet Commonly known as: MAXZIDE-25 Take 1 tablet by mouth daily.   warfarin 5 MG tablet Commonly known as: COUMADIN  Take as directed. If you are unsure how to take this medication, talk to your nurse or doctor. Original instructions: TAKE 1 TABLET BY MOUTH DAILY OR AS DIRECTED BY COUMADIN  CLINIC What changed:  how much to take how to take this when to take this additional instructions        Follow-up Information     [facility]. Schedule an appointment as soon as possible for a visit.   Specialty: Rehabilitation Contact information: [address] [place]  [address] [phone]        Marvine Rush, MD. Schedule an appointment as soon as possible for a visit in 1 week(s).   Specialty: Family Medicine Contact information: [address] [place] [address] [phone]         [facility] Follow up.   Why: They should call you with an appointment.  If you don't hear from them in 1 week, call the number below Contact information: [address] [place], [address] [place]  [address] [phone]                Discharge Instructions     Ambulatory referral to Neurology   Complete by: As directed    An appointment is requested in approximately: 8 weeks For recurrent seizure   Ambulatory referral to Occupational Therapy   Complete by: As directed    Diet - low sodium heart healthy   Complete by: As directed    Discharge instructions   Complete by: As directed    **IMPORTANT DISCHARGE INSTRUCTIONS**   From Dr. Jonel: You were admitted for a seizure Here, we found that you also had had a very small  stroke  Thankfully, because of your warfarin, this was a tiny stroke that caused no obvious major disability.  You should resume your home medicines  Because of the seizure, CONTINUE the Keppra  to 500 mg twice daily This should be taken 2 times daily Go see the [facility] Neurology doctors in 8-12 weeks and discuss what to do about this medicine long term Discuss with a neurologist before changing the dose of this on your own   We noticed a difference in what our records indicate and what you reported taking on your medicine list: For now, hold furosemide  and torsemide  Go see Dr. Marvine in 1 week  While you were here, we also noticed your warfarin level (your INR) was low Take your warfarin daily For the next week, take Lovenox 100 mg twice daily to keep the blood thin Go get an INR check in 3 days (on Friday or Monday) and continue the Lovenox until the INR is back in your normal range and Dr. Marvine tells you to stop   Increase activity slowly   Complete by: As directed        Discharge Exam: Filed Weights   07/18/23 1031  Weight: 96.2 kg    General: Pt is  alert, awake, not in acute distress Cardiovascular: RRR, nl S1-S2, no murmurs appreciated.   No LE edema.   Respiratory: Normal respiratory rate and rhythm.  CTAB without rales or wheezes. Abdominal: Abdomen soft and non-tender.  No distension or HSM.   Neuro/Psych: Strength symmetric in upper and lower extremities.  Judgment and insight appear normal.   Condition at discharge: good  The results of significant diagnostics from this hospitalization (including imaging, microbiology, ancillary and laboratory) are listed below for reference.   Imaging Studies: ECHOCARDIOGRAM COMPLETE Result Date: 07/19/2023    ECHOCARDIOGRAM REPORT   Patient Name:   TAYLIN MANS Date of Exam: 07/19/2023 Medical Rec #:  [id number]        Height:       59.0 in Accession #:    [id number]       Weight:       212.1 lb Date of Birth:  1955-09-10        BSA:          1.891 m Patient Age:    67 years         BP:           170/62 mmHg Patient Gender: F                HR:           72 bpm. Exam Location:  Inpatient Procedure: 2D Echo, Cardiac Doppler, Color Doppler and Intracardiac            Opacification Agent (Both Spectral and Color Flow Doppler were            utilized during procedure). Indications:    Stroke I63.9  History:        Patient has prior history of Echocardiogram examinations, most                 recent 01/25/2023. 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 Procedure date:                 04/22/2010, Arrythmias:Atrial Fibrillation; Risk  Factors:Hypertension.  Sonographer:    Koleen Popper RDCS Referring Phys: [id number] MARSA NOVAK MELVIN IMPRESSIONS  1. Left ventricular ejection fraction, by estimation, is 40 to 45%. The left ventricle has mildly decreased function. The left ventricle demonstrates global hypokinesis. There is moderate concentric left ventricular hypertrophy. Left ventricular diastolic parameters are indeterminate.  2.  Right ventricular systolic function is mildly reduced. The right ventricular size is normal. There is normal pulmonary artery systolic pressure.  3. Left atrial size was severely dilated.  4. The mitral valve has been repaired/replaced. No evidence of mitral valve regurgitation. The mean mitral valve gradient is 4.3 mmHg.  5. The aortic valve is tricuspid. There is mild calcification of the aortic valve. Aortic valve regurgitation is not visualized. Aortic valve sclerosis is present, with no evidence of aortic valve stenosis.  6. The inferior vena cava is normal in size with <50% respiratory variability, suggesting right atrial pressure of 8 mmHg. Comparison(s): RVSP has improved from prior study; there is no clear evidence of interatrial shunting. FINDINGS  Left Ventricle: Left ventricular ejection fraction, by estimation, is 40 to 45%. The left ventricle has mildly decreased function. The left ventricle demonstrates global hypokinesis. Definity contrast agent was given IV to delineate the left ventricular  endocardial borders. The left ventricular internal cavity size was normal in size. There is moderate concentric left ventricular hypertrophy. Left ventricular diastolic parameters are indeterminate. Right Ventricle: The right ventricular size is normal. No increase in right ventricular wall thickness. Right ventricular systolic function is mildly reduced. There is normal pulmonary artery systolic pressure. The tricuspid regurgitant velocity is 2.41 m/s, and with an assumed right atrial pressure of 8 mmHg, the estimated right ventricular systolic pressure is 31.2 mmHg. Left Atrium: Left atrial size was severely dilated. Right Atrium: Right atrial size was not well visualized. Pericardium: There is no evidence of pericardial effusion. Mitral Valve: The mitral valve has been repaired/replaced. No evidence of mitral valve regurgitation. MV peak gradient, 15.3 mmHg. The mean mitral valve gradient is 4.3 mmHg.  Tricuspid Valve: The tricuspid valve is normal in structure. Tricuspid valve regurgitation is mild . No evidence of tricuspid stenosis. Aortic Valve: The aortic valve is tricuspid. There is mild calcification of the aortic valve. There is mild aortic valve annular calcification. Aortic valve regurgitation is not visualized. Aortic valve sclerosis is present, with no evidence of aortic valve stenosis. Pulmonic Valve: The pulmonic valve was not well visualized. Pulmonic valve regurgitation is not visualized. No evidence of pulmonic stenosis. Aorta: The aortic root and ascending aorta are structurally normal, with no evidence of dilitation. Venous: The inferior vena cava is normal in size with less than 50% respiratory variability, suggesting right atrial pressure of 8 mmHg. IAS/Shunts: The atrial septum is grossly normal.  LEFT VENTRICLE PLAX 2D LVIDd:         4.90 cm LVIDs:         4.20 cm LV PW:         1.20 cm LV IVS:        1.30 cm LVOT diam:     1.90 cm LV SV:         40 LV SV Index:   21 LVOT Area:     2.84 cm  RIGHT VENTRICLE            IVC RV S prime:     7.24 cm/s  IVC diam: 2.10 cm TAPSE (M-mode): 1.1 cm LEFT ATRIUM  Index LA diam:        5.80 cm  3.07 cm/m LA Vol (A2C):   121.0 ml 63.99 ml/m LA Vol (A4C):   69.6 ml  36.81 ml/m LA Biplane Vol: 92.1 ml  48.71 ml/m  AORTIC VALVE LVOT Vmax:   63.70 cm/s LVOT Vmean:  46.300 cm/s LVOT VTI:    0.142 m  AORTA Ao Root diam: 2.90 cm Ao Asc diam:  3.30 cm MITRAL VALVE             TRICUSPID VALVE MV Area VTI:  1.06 cm   TR Peak grad:   23.2 mmHg MV Peak grad: 15.3 mmHg  TR Vmax:        241.00 cm/s MV Mean grad: 4.3 mmHg MV Vmax:      1.96 m/s   SHUNTS MV Vmean:     84.0 cm/s  Systemic VTI:  0.14 m                          Systemic Diam: 1.90 cm Stanly Leavens MD Electronically signed by Stanly Leavens MD Signature Date/Time: 07/19/2023/11:55:42 AM    Final    Overnight EEG with video Result Date: 07/19/2023 Shelton Arlin KIDD, MD      07/19/2023  9:03 AM Patient Name: [place] W Sivils MRN: [id number] Epilepsy Attending: Arlin KIDD Shelton Referring Physician/Provider: Voncile Isles, MD Duration: 07/18/2023 1653 to 07/19/2023 0900  Patient history:  68 y.o. 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 EEG to evaluate for seizure.  Level of alertness: Awake, asleep  AEDs during EEG study: LEV  Technical aspects: This EEG study was done with scalp electrodes positioned according to the 10-20 International system of electrode placement. Electrical activity was reviewed with band pass filter of 1-70Hz , sensitivity of 7 uV/mm, display speed of 90mm/sec with a 60Hz  notched filter applied as appropriate. EEG data were recorded continuously and digitally stored. Video monitoring was available and reviewed as appropriate.  Description: The posterior dominant rhythm consists of 8-9 Hz activity of moderate voltage (25-35 uV) seen predominantly in posterior head regions, symmetric and reactive to eye opening and eye closing. Sleep was characterized by vertex waves, sleep spindles (12 to 14 Hz), maximal frontocentral region. Hyperventilation and photic stimulation were not performed.    IMPRESSION: This study is within normal limits. No seizures or epileptiform discharges were seen throughout the recording.  Priyanka O Yadav   CT Head Wo Contrast Result Date: 07/18/2023 CLINICAL DATA:  Initial evaluation for acute mental status change, unknown cause. EXAM: CT HEAD WITHOUT CONTRAST TECHNIQUE: Contiguous axial images were obtained from the base of the skull through the vertex without intravenous contrast. RADIATION DOSE REDUCTION: This exam was performed according to the departmental  dose-optimization program which includes automated exposure control, adjustment of the mA and/or kV according to patient size and/or use of iterative reconstruction technique. COMPARISON:  Prior CT and MRI from earlier the same day. FINDINGS: Brain: Cerebral volume within normal limits. Chronic right MCA distribution infarct again noted. Underlying mild chronic microvascular ischemic disease. Previously identified small left parietal infarct not visible by CT. No other acute large vessel territory infarct. No acute intracranial hemorrhage. No mass lesion or midline shift. No hydrocephalus. No subdural  collection by CT. Vascular: No abnormal hyperdense vessel. Skull: Scalp soft tissues and calvarium demonstrate no new finding. Sinuses/Orbits: Globes orbital soft tissues demonstrate no acute finding. Paranasal sinuses and mastoid air cells remain largely clear. Other: None. IMPRESSION: 1. No acute intracranial abnormality. Previously identified small left parietal infarct not visible by CT. 2. Chronic right MCA distribution infarct with underlying mild chronic microvascular ischemic disease. Electronically Signed   By: Morene Hoard M.D.   On: 07/18/2023 20:25   EEG adult Result Date: 07/18/2023 Shelton Arlin KIDD, MD     07/18/2023 10:42 PM Patient Name: Vanshika W Dome MRN: [id number] Epilepsy Attending: Arlin KIDD Shelton Referring Physician/Provider: Voncile Isles, MD Date: 07/18/2023 Duration: 21.43 mins Patient history:  68 y.o. 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 EEG to evaluate for seizure. Level of alertness: Awake, asleep AEDs during EEG study: LEV, Ativan Technical aspects: This EEG study was done with scalp electrodes  positioned according to the 10-20 International system of electrode placement. Electrical activity was reviewed with band pass filter of 1-70Hz , sensitivity of 7 uV/mm, display speed of 50mm/sec with a 60Hz  notched filter applied as appropriate. EEG data were recorded continuously and digitally stored. Video monitoring was available and reviewed as appropriate. Description: The posterior dominant rhythm consists of 8-9 Hz activity of moderate voltage (25-35 uV) seen predominantly in posterior head regions, symmetric and reactive to eye opening and eye closing. Sleep was characterized by vertex waves, sleep spindles (12 to 14 Hz), maximal frontocentral region. EEG showed intermittent generalized 3 to 6 Hz theta-delta slowing. Hyperventilation and photic stimulation were not performed.   ABNORMALITY - Intermittent slow, generalized IMPRESSION: This study is suggestive of mild diffuse encephalopathy. No seizures or epileptiform discharges were seen throughout the recording. Arlin KIDD Shelton   DG Chest Portable 1 View Result Date: 07/18/2023 CLINICAL DATA:  Cerebrovascular accident. EXAM: PORTABLE CHEST 1 VIEW COMPARISON:  07/18/2023 FINDINGS: Prior median sternotomy.  Mitral valve prosthesis. Atherosclerotic calcification of the aortic arch. Moderate enlargement of the cardiopericardial silhouette appears unchanged. Indistinct pulmonary vasculature raising the possibility of pulmonary venous hypertension. No overt edema. No blunting of the costophrenic angles. IMPRESSION: 1. Moderate enlargement of the cardiopericardial silhouette, with indistinct pulmonary vasculature raising the possibility of pulmonary venous hypertension. No overt edema. 2. Prior median sternotomy and mitral valve prosthesis. Electronically Signed   By: Ryan Salvage M.D.   On: 07/18/2023 15:23   CT HEAD WO CONTRAST ( ) Result Date: 07/18/2023 CLINICAL DATA:  Subdural hematoma.  Seizures. EXAM: CT HEAD WITHOUT CONTRAST TECHNIQUE:  Contiguous axial images were obtained from the base of the skull through the vertex without intravenous contrast. RADIATION DOSE REDUCTION: This exam was performed according to the departmental dose-optimization program which includes automated exposure control, adjustment of the mA and/or kV according to patient size and/or use of iterative reconstruction technique. COMPARISON:  Head CT and MRI 07/18/2023 FINDINGS: Brain: A subtle subcentimeter hypodensity involving left parietal cortex corresponds to the acute/subacute infarct on MRI. A large chronic right MCA infarct is again noted. Mildly prominent extra-axial low-density fluid over the right cerebral convexity is unchanged and favored to reflect subarachnoid space enlargement from volume loss. No definite CT correlate is identified for the thin right-sided subdural hematoma which was questioned on today's  earlier MRI, and as previously noted this may instead reflect dural thickening (or a thin hematoma which is CT occult). There is a background of mild to moderate chronic small vessel ischemic disease in the cerebral white matter. No interval intracranial hemorrhage, mass, or midline shift is identified. Vascular: Calcified atherosclerosis at the skull base. Skull: No fracture or suspicious lesion. Sinuses/Orbits: Visualized paranasal sinuses and mastoid air cells are clear. Unremarkable orbits. Other: Small posterior right scalp hematoma. IMPRESSION: 1. No definite subdural hematoma by CT. 2. Known small acute/subacute left parietal cortical infarct. 3. Small posterior right scalp hematoma. 4. Large chronic right MCA infarct. Electronically Signed   By: Dasie Hamburg M.D.   On: 07/18/2023 15:13   DG Chest Port 1 View Result Date: 07/18/2023 CLINICAL DATA:  Seizure, trauma EXAM: PORTABLE CHEST 1 VIEW COMPARISON:  01/02/2023 FINDINGS: Mitral valve prosthesis and prior median sternotomy. Atherosclerotic calcification of the aortic arch. Moderate enlargement of  the cardiopericardial silhouette, similar to prior. Mildly indistinct pulmonary vasculature, cannot exclude pulmonary venous hypertension. No overt edema or blunting of the costophrenic angles. No airspace opacity identified. No significant bony findings. IMPRESSION: 1. Moderate enlargement of the cardiopericardial silhouette, similar to prior. 2. Mildly indistinct pulmonary vasculature, cannot exclude pulmonary venous hypertension. 3. Mitral valve prosthesis and prior median sternotomy. Electronically Signed   By: Ryan Salvage M.D.   On: 07/18/2023 13:05   DG Pelvis Portable Result Date: 07/18/2023 CLINICAL DATA:  Seizure.  Fall. EXAM: PORTABLE PELVIS 1-2 VIEWS COMPARISON:  None Available. FINDINGS: Contrast medium noted in the urinary bladder. Moderate degenerative chondral thinning in both hips with mild associated spurring. Overall moderate degenerative hip arthropathy. No SI joint widening. No fracture or acute bony findings identified. IMPRESSION: 1. Moderate degenerative hip arthropathy. No acute bony findings. Electronically Signed   By: Ryan Salvage M.D.   On: 07/18/2023 13:04   MR BRAIN WO CONTRAST Addendum Date: 07/18/2023 ADDENDUM REPORT: 07/18/2023 12:54 ADDENDUM: Impressions #2 and #3 called by telephone at the time of interpretation on 07/18/2023 at 12:54 pm to provider [facility] , who verbally acknowledged these results. Electronically Signed   By: Rockey Childs D.O.   On: 07/18/2023 12:54   Result Date: 07/18/2023 CLINICAL DATA:  Neuro deficit, acute, stroke suspected. Seizure. Fall. EXAM: MRI HEAD WITHOUT CONTRAST TECHNIQUE: Multiplanar, multiecho pulse sequences of the brain and surrounding structures were obtained without intravenous contrast. COMPARISON:  Non-contrast head CT and CT angiogram head/neck performed earlier today 07/18/2023. FINDINGS: Intermittently motion degraded examination (with up to moderate motion degradation of the acquired sequences). Brain: 5 mm focus  of cortical restricted diffusion within the left parietal lobe compatible with an acute/subacute infarct (series 5, image 79) (series 6, image 31). Mild smooth dural thickening versus thin subdural hematoma along the right frontoparietal convexity, measuring up to 2 mm in thickness (for instance as seen on series 9, image 34). No mass effect upon the underlying brain parenchyma. No midline shift. Large chronic cortical/subcortical right MCA territory infarct again demonstrated (affecting the frontal lobe/insula, temporal lobe and parietal lobe). Mild-to-moderate multifocal T2 FLAIR hyperintense signal abnormality elsewhere within the cerebral white matter, nonspecific but compatible chronic small vessel ischemic disease. Few nonspecific punctate chronic microhemorrhages scattered within the supratentorial brain. Chronic left thalamic lacunar infarct (series 10, image 13). No evidence of an intracranial mass. Vascular: Maintained flow voids within the proximal large arterial vessels. Skull and upper cervical spine: No focal worrisome marrow lesion. Sinuses/Orbits: No mass or acute finding within the imaged orbits. No  significant paranasal sinus disease. Other: Right parietal scalp hematoma. Attempts are being made to reach the ordering provider at this time. IMPRESSION: 1. Intermittently motion degraded exam. 2. 5 mm acute/subacute cortical infarct within the left parietal lobe. 3. Possible thin subdural hematoma along the right frontoparietal convexity (versus dural thickening). No associated mass effect. 4. Known large chronic right MCA territory infarct. 5. Background mild-to-moderate cerebral white matter chronic small vessel ischemic disease. 6. Chronic left thalamic lacunar infarct. 7. Right parietal scalp hematoma. Electronically Signed: By: Rockey Childs D.O. On: 07/18/2023 12:46   CT Cervical Spine Wo Contrast Result Date: 07/18/2023 CLINICAL DATA:  Neck trauma, dangerous injury mechanism (Age 78-64y)  EXAM: CT CERVICAL SPINE WITHOUT CONTRAST TECHNIQUE: Multidetector CT imaging of the cervical spine was performed without intravenous contrast. Multiplanar CT image reconstructions were also generated. RADIATION DOSE REDUCTION: This exam was performed according to the departmental dose-optimization program which includes automated exposure control, adjustment of the mA and/or kV according to patient size and/or use of iterative reconstruction technique. COMPARISON:  None Available. FINDINGS: Alignment: Slight degenerative anterolisthesis at C4-5. Slight reversal of the normal cervical lordosis. Skull base and vertebrae: Intact. No osseous lesions are present. There is a prominent vascular foramen present on the left at C4. Soft tissues and spinal canal: No hematoma or significant soft tissue swelling present. Disc levels: Endplate ridging with mild central spinal canal stenosis and mild bilateral neural foraminal stenosis at C5-6. There is also mild central spinal canal stenosis and bilateral neural foraminal stenosis at C6-7. There are moderate facet hypertrophic changes on the right at C2-3. Upper chest: Unremarkable. Other: None. IMPRESSION: 1. Mild-to-moderate cervical spondylosis without evidence of acute traumatic injury. Electronically Signed   By: Evalene Coho M.D.   On: 07/18/2023 11:37   CT ANGIO HEAD NECK W WO CM (CODE STROKE) Result Date: 07/18/2023 CLINICAL DATA:  Provided history: Neuro deficit, acute, stroke suspected. EXAM: CT ANGIOGRAPHY HEAD AND NECK WITH AND WITHOUT CONTRAST TECHNIQUE: Multidetector CT imaging of the head and neck was performed using the standard protocol during bolus administration of intravenous contrast. Multiplanar CT image reconstructions and MIPs were obtained to evaluate the vascular anatomy. Carotid stenosis measurements (when applicable) are obtained utilizing NASCET criteria, using the distal internal carotid diameter as the denominator. RADIATION DOSE REDUCTION:  This exam was performed according to the departmental dose-optimization program which includes automated exposure control, adjustment of the mA and/or kV according to patient size and/or use of iterative reconstruction technique. CONTRAST:  75mL OMNIPAQUE  IOHEXOL  350 MG/ML SOLN COMPARISON:  Head CT 07/18/2023 noncontrast head CT performed earlier today 07/18/2023. FINDINGS: CTA NECK FINDINGS Aortic arch: Standard aortic branching. Atherosclerotic plaque within the visualized aortic arch and proximal major branch vessels of the neck. No hemodynamically significant innominate or proximal subclavian artery stenosis. Right carotid system: CCA and ICA patent within the neck without stenosis or significant atherosclerotic disease. Left carotid system: CCA and ICA patent within the neck without stenosis. Mild nonstenotic atherosclerotic plaque at the CCA origin. Vertebral arteries: Codominant and patent within the neck without stenosis or significant atherosclerotic disease. Skeleton: Cervical spine findings separately reported on same day cervical spine CT. The patient is edentulous. Other neck: No neck mass or cervical lymphadenopathy. Upper chest: No consolidation within the imaged lung apices. Prior median sternotomy. Review of the MIP images confirms the above findings CTA HEAD FINDINGS Anterior circulation: The intracranial internal carotid arteries are patent. Atherosclerotic plaque within both vessels with no more than mild stenosis. The M1 middle cerebral  arteries are patent. No M2 proximal branch occlusion or high-grade proximal stenosis. 1-2 mm posterolaterally projecting vascular protrusion arising from the cavernous right internal carotid artery, which may reflect an aneurysm or the origin of an otherwise poorly delineated branch vessel (series 9, image 215). 1-2 mm inferiorly projecting vascular protrusion arising from the paraclinoid left internal carotid artery, which may reflect an aneurysm or infundibulum  (series 9, images 226 and 227). Posterior circulation: The intracranial vertebral arteries are patent. The basilar artery is patent. The posterior cerebral arteries are patent. Posterior communicating arteries are diminutive or absent, bilaterally. Venous sinuses: Within the limitations of contrast timing, no convincing thrombus. Anatomic variants: As described. Review of the MIP images confirms the above findings No emergent large vessel occlusion identified. These results were communicated to Dr. Voncile at 11:32 amon 6/23/2025by text page via the [facility] messaging system. IMPRESSION: CTA neck: 1. The common carotid and internal carotid arteries are patent within the neck. Non-stenotic atherosclerotic plaque at the left common carotid artery origin. 2. Vertebral arteries patent within the neck without stenosis or significant atherosclerotic disease. 3. Aortic Atherosclerosis (ICD10-I70.0). 4. Cervical spine findings separately reported on same day cervical spine CT. CTA head: 1. No proximal intracranial large vessel occlusion or high-grade proximal arterial stenosis identified. 2. Atherosclerotic plaque within the intracranial internal carotid arteries with no more than mild stenosis. 3. 1-2 mm vascular protrusion arising from the cavernous right internal carotid artery, which may reflect an aneurysm or the origin of an otherwise poorly delineated branch vessel. 4. 1-2 mm vascular protrusion arising from the paraclinoid left internal carotid artery, which may reflect an aneurysm or infundibulum. Electronically Signed   By: Rockey Childs D.O.   On: 07/18/2023 11:34   CT HEAD CODE STROKE WO CONTRAST Result Date: 07/18/2023 CLINICAL DATA:  Code stroke. Neuro deficit, acute, stroke suspected. EXAM: CT HEAD WITHOUT CONTRAST TECHNIQUE: Contiguous axial images were obtained from the base of the skull through the vertex without intravenous contrast. RADIATION DOSE REDUCTION: This exam was performed according to the  departmental dose-optimization program which includes automated exposure control, adjustment of the mA and/or kV according to patient size and/or use of iterative reconstruction technique. COMPARISON:  Head CT 09/04/2010. FINDINGS: Brain: Large chronic right MCA territory cortical/subcortical infarct affecting the frontal, temporal and parietal lobes, not appreciably changed from the prior head CT of 09/04/2010. Background mild patchy and ill-defined hypoattenuation within the cerebral white matter, nonspecific but compatible chronic small vessel ischemic disease. Chronic lacunar infarct within the left thalamus, unchanged. There is no acute intracranial hemorrhage. No acute demarcated cortical infarct. No extra-axial fluid collection. No evidence of an intracranial mass. No midline shift. Vascular: No hyperdense vessel.  Atherosclerotic calcifications. Skull: No calvarial fracture or aggressive osseous lesion. Sinuses/Orbits: No mass or acute finding within the imaged orbits. No significant paranasal sinus disease at the imaged levels. ASPECTS [facility] Stroke Program Early CT Score) - Ganglionic level infarction (caudate, lentiform nuclei, internal capsule, insula, M1-M3 cortex): 7 - Supraganglionic infarction (M4-M6 cortex): 3 Total score (0-10 with 10 being normal): 10 (when discounting a chronic right MCA territory infarct). No evidence of an acute intracranial abnormality. These results were communicated to Dr. Voncile at 11:15 amon 6/23/2025by text page via the [facility] messaging system. IMPRESSION: 1. No evidence of an acute intracranial abnormality. 2. Known large chronic right MCA territory infarct. 3. Mild background cerebral white matter chronic small vessel ischemic disease, progressed from the prior head CT of 09/04/2010. 4. Chronic left thalamic lacunar infarct, unchanged.  Electronically Signed   By: Rockey Childs D.O.   On: 07/18/2023 11:15    Microbiology: Results for orders placed or performed during  the hospital encounter of 01/18/22  Resp panel by RT-PCR (RSV, Flu A&B, Covid) Anterior Nasal Swab     Status: None   Collection Time: 01/18/22 10:37 AM   Specimen: Anterior Nasal Swab  Result Value Ref Range Status   SARS Coronavirus 2 by RT PCR NEGATIVE NEGATIVE Final    Comment: (NOTE) SARS-CoV-2 target nucleic acids are NOT DETECTED.  The SARS-CoV-2 RNA is generally detectable in upper respiratory specimens during the acute phase of infection. The lowest concentration of SARS-CoV-2 viral copies this assay can detect is 138 copies/mL. A negative result does not preclude SARS-Cov-2 infection and should not be used as the sole basis for treatment or other patient management decisions. A negative result may occur with  improper specimen collection/handling, submission of specimen other than nasopharyngeal swab, presence of viral mutation(s) within the areas targeted by this assay, and inadequate number of viral copies(<138 copies/mL). A negative result must be combined with clinical observations, patient history, and epidemiological information. The expected result is Negative.  Fact Sheet for Patients:  BloggerCourse.com  Fact Sheet for Healthcare Providers:  SeriousBroker.it  This test is no t yet approved or cleared by the [place]  FDA and  has been authorized for detection and/or diagnosis of SARS-CoV-2 by FDA under an Emergency Use Authorization (EUA). This EUA will remain  in effect (meaning this test can be used) for the duration of the COVID-19 declaration under Section 564(b)(1) of the Act, 21 U.S.C.section 360bbb-3(b)(1), unless the authorization is terminated  or revoked sooner.       Influenza A by PCR NEGATIVE NEGATIVE Final   Influenza B by PCR NEGATIVE NEGATIVE Final    Comment: (NOTE) The Xpert Xpress SARS-CoV-2/FLU/RSV plus assay is intended as an aid in the diagnosis of influenza from Nasopharyngeal swab  specimens and should not be used as a sole basis for treatment. Nasal washings and aspirates are unacceptable for Xpert Xpress SARS-CoV-2/FLU/RSV testing.  Fact Sheet for Patients: BloggerCourse.com  Fact Sheet for Healthcare Providers: SeriousBroker.it  This test is not yet approved or cleared by the [place]  FDA and has been authorized for detection and/or diagnosis of SARS-CoV-2 by FDA under an Emergency Use Authorization (EUA). This EUA will remain in effect (meaning this test can be used) for the duration of the COVID-19 declaration under Section 564(b)(1) of the Act, 21 U.S.C. section 360bbb-3(b)(1), unless the authorization is terminated or revoked.     Resp Syncytial Virus by PCR NEGATIVE NEGATIVE Final    Comment: (NOTE) Fact Sheet for Patients: BloggerCourse.com  Fact Sheet for Healthcare Providers: SeriousBroker.it  This test is not yet approved or cleared by the [place]  FDA and has been authorized for detection and/or diagnosis of SARS-CoV-2 by FDA under an Emergency Use Authorization (EUA). This EUA will remain in effect (meaning this test can be used) for the duration of the COVID-19 declaration under Section 564(b)(1) of the Act, 21 U.S.C. section 360bbb-3(b)(1), unless the authorization is terminated or revoked.  Performed at [facility], [address], [place], [place] [address]   Aerobic/Anaerobic Culture w Gram Stain (surgical/deep wound)     Status: None   Collection Time: 01/18/22  1:48 PM   Specimen: Throat  Result Value Ref Range Status   Specimen Description THROAT  Final   Special Requests NONE  Final   Gram Stain  Final    MODERATE WBC SEEN ABUNDANT GRAM POSITIVE COCCI FEW GRAM NEGATIVE RODS    Culture   Final    MODERATE NORMAL OROPHARYNGEAL FLORA NO GROUP A STREP (S.PYOGENES) ISOLATED NO STAPHYLOCOCCUS AUREUS  ISOLATED MODERATE PREVOTELLA MELANINOGENICA BETA LACTAMASE POSITIVE Performed at [facility], [address], [place], [place] [address]    Report Status 01/21/2022 FINAL  Final    Labs: CBC: Recent Labs  Lab 07/18/23 1047 07/18/23 1215 07/19/23 0443 07/20/23 0433  WBC 8.0  --  7.3 5.5  NEUTROABS 6.0  --   --   --   HGB 12.1 13.3 10.5* 10.3*  HCT 38.4 39.0 32.9* 32.7*  MCV 97.5  --  96.5 97.0  PLT 228  --  205 201   Basic Metabolic Panel: Recent Labs  Lab 07/18/23 1047 07/18/23 1215 07/19/23 0443  NA 137 138 135  K 3.7 3.6 3.8  CL 99 101 104  CO2 21*  --  22  GLUCOSE 171* 164* 138*  BUN 17 19 12   CREATININE 1.14* 1.10* 0.97  CALCIUM  9.3  --  8.4*   Liver Function Tests: Recent Labs  Lab 07/18/23 1047 07/19/23 0443  AST 41 35  ALT 25 21  ALKPHOS 102 85  BILITOT 1.0 1.1  PROT 7.7 6.1*  ALBUMIN 4.2 3.5   CBG: No results for input(s): GLUCAP in the last 168 hours.  Discharge time spent: approximately 45 minutes spent on discharge counseling, evaluation of patient on day of discharge, and coordination of discharge planning with nursing, social work, pharmacy and case management  Signed: Lonni SHAUNNA Dalton, MD [facility] 07/20/2023

## 2023-07-20 NOTE — Plan of Care (Signed)
  Problem: Education: Goal: Knowledge of disease or condition will improve Outcome: Adequate for Discharge Goal: Knowledge of secondary prevention will improve (MUST DOCUMENT ALL) Outcome: Adequate for Discharge Goal: Knowledge of patient specific risk factors will improve (DELETE if not current risk factor) Outcome: Adequate for Discharge   Problem: Ischemic Stroke/TIA Tissue Perfusion: Goal: Complications of ischemic stroke/TIA will be minimized Outcome: Adequate for Discharge   Problem: Coping: Goal: Will verbalize positive feelings about self Outcome: Adequate for Discharge Goal: Will identify appropriate support needs Outcome: Adequate for Discharge   Problem: Health Behavior/Discharge Planning: Goal: Ability to manage health-related needs will improve Outcome: Adequate for Discharge Goal: Goals will be collaboratively established with patient/family Outcome: Adequate for Discharge   Problem: Self-Care: Goal: Ability to participate in self-care as condition permits will improve Outcome: Adequate for Discharge Goal: Verbalization of feelings and concerns over difficulty with self-care will improve Outcome: Adequate for Discharge Goal: Ability to communicate needs accurately will improve Outcome: Adequate for Discharge   Problem: Nutrition: Goal: Risk of aspiration will decrease Outcome: Adequate for Discharge Goal: Dietary intake will improve Outcome: Adequate for Discharge   Problem: Education: Goal: Knowledge of General Education information will improve Description: Including pain rating scale, medication(s)/side effects and non-pharmacologic comfort measures Outcome: Adequate for Discharge   Problem: Health Behavior/Discharge Planning: Goal: Ability to manage health-related needs will improve Outcome: Adequate for Discharge   Problem: Clinical Measurements: Goal: Ability to maintain clinical measurements within normal limits will improve Outcome: Adequate for  Discharge Goal: Will remain free from infection Outcome: Adequate for Discharge Goal: Diagnostic test results will improve Outcome: Adequate for Discharge Goal: Respiratory complications will improve Outcome: Adequate for Discharge Goal: Cardiovascular complication will be avoided Outcome: Adequate for Discharge   Problem: Activity: Goal: Risk for activity intolerance will decrease Outcome: Adequate for Discharge   Problem: Nutrition: Goal: Adequate nutrition will be maintained Outcome: Adequate for Discharge   Problem: Coping: Goal: Level of anxiety will decrease Outcome: Adequate for Discharge   Problem: Elimination: Goal: Will not experience complications related to bowel motility Outcome: Adequate for Discharge Goal: Will not experience complications related to urinary retention Outcome: Adequate for Discharge   Problem: Pain Managment: Goal: General experience of comfort will improve and/or be controlled Outcome: Adequate for Discharge   Problem: Safety: Goal: Ability to remain free from injury will improve Outcome: Adequate for Discharge   Problem: Skin Integrity: Goal: Risk for impaired skin integrity will decrease Outcome: Adequate for Discharge   Problem: Acute Rehab OT Goals (only OT should resolve) Goal: Pt. Will Perform Lower Body Dressing Outcome: Adequate for Discharge Goal: Pt. Will Transfer To Toilet Outcome: Adequate for Discharge Goal: Pt. Will Perform Tub/Shower Transfer Outcome: Adequate for Discharge Goal: OT Additional ADL Goal #1 Outcome: Adequate for Discharge Goal: OT Additional ADL Goal #2 Outcome: Adequate for Discharge

## 2023-07-20 NOTE — Progress Notes (Signed)
 Pharmacy Consult for Heparin  Indication: MVR  Allergies  Allergen Reactions   Penicillins Other (See Comments)    Couldn't walk    Patient Measurements: Height: 4' 11 (149.9 cm) Weight: 96.2 kg (212 lb 1.3 oz) IBW/kg (Calculated) : 43.2 HEPARIN DW (KG): 66.7  Vital Signs: Temp: 97.8 F (36.6 C) (06/25 0410) Temp Source: Oral (06/25 0410) BP: 106/51 (06/25 0410) Pulse Rate: 71 (06/25 0410)  Labs: Recent Labs    07/18/23 1047 07/18/23 1215 07/19/23 0110 07/19/23 0443 07/19/23 0939 07/20/23 0433  HGB 12.1 13.3  --  10.5*  --  10.3*  HCT 38.4 39.0  --  32.9*  --  32.7*  PLT 228  --   --  205  --  201  APTT 29  --   --   --   --   --   LABPROT 22.0*  --   --  21.4*  --  23.5*  INR 1.9*  --   --  1.8*  --  2.1*  HEPARINUNFRC  --   --  0.14*  --  0.30  --   CREATININE 1.14* 1.10*  --  0.97  --   --     Estimated Creatinine Clearance: 55.6 mL/min (by C-G formula based on SCr of 0.97 mg/dL).  Assessment: Natasha Davis a 68 y.o. female presented with SDH. 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 Pharmacy has been consulted for heparin dosing.   Anticoagulation PTA: Warfarin 5 mg daily   6/25: INR below goal at 2.1 following 7.5 mg dose yesterday. Hgb remains stable at 10.3, PLT 200s. Will give another slightly higher dose than PTA regimen today and anticipate switching back to PTA regimen in the next day or 2.   Goal of Therapy:  INR 2.5-3.5  Monitor platelets by anticoagulation protocol: Yes   Plan:  Continue lovenox 100 mg Q12H Warfarin 7.5 mg x1  CBC, INR daily   Massie Fila, PharmD Clinical Pharmacist  07/20/2023 7:15 AM

## 2023-07-21 ENCOUNTER — Telehealth: Payer: Self-pay

## 2023-07-21 NOTE — Transitions of Care (Post Inpatient/ED Visit) (Signed)
 07/21/2023  Name: Natasha Davis MRN: [id number] DOB: May 04, 1955  Today's TOC FU Call Status: Today's TOC FU Call Status:: Successful TOC FU Call Completed TOC FU Call Complete Date: 07/21/23 Patient's Name and Date of Birth confirmed.  Transition Care Management Follow-up Telephone Call Date of Discharge: 07/20/23 Discharge Facility: [facility]) Type of Discharge: Inpatient Admission Primary Inpatient Discharge Diagnosis:: Seizures How have you been since you were released from the hospital?: Better Any questions or concerns?: No  Items Reviewed: Did you receive and understand the discharge instructions provided?: Yes (Reviewed Lovenox injections and Warfarin use) Medications obtained,verified, and reconciled?: Yes (Medications Reviewed) Any new allergies since your discharge?: No Dietary orders reviewed?: Yes Type of Diet Ordered:: Low sodium Heart Helathy Do you have support at home?: Yes People in Home [RPT]: child(ren), adult, grandchild(ren) Name of Support/Comfort Primary Source: Natasha Davis, daughter  Medications Reviewed Today: Medications Reviewed Today     Reviewed by [name], RN (Case Manager) on 07/21/23 at 1013  Med List Status: <None>   Medication Order Taking? Sig Documenting Provider Last Dose Status Informant  acetaminophen  (TYLENOL ) 650 MG CR tablet [id number]  Take 1,300 mg by mouth daily as needed for pain. Pain [provider]  Active Self, Pharmacy Records  atorvastatin  (LIPITOR) 20 MG tablet [id number]  TAKE 1 TABLET BY MOUTH EVERY DAY  Patient taking differently: Take 20 mg by mouth at bedtime.   Nahser, Aleene PARAS, MD  Active Self, Pharmacy Records  enoxaparin (LOVENOX) 100 MG/ML injection [id number]  Inject 1 mL (100 mg total) into the skin every 12 (twelve) hours. Jonel Lonni SQUIBB, MD  Active   furosemide  (LASIX ) 40 MG tablet [id number]  Take 40 mg by mouth daily.  Patient not taking: Reported on 07/19/2023   [provider]  Active Self, Pharmacy Records  levETIRAcetam  (KEPPRA ) 500 MG tablet [id number]  Take 1 tablet (500 mg total) by mouth 2 (two) times daily. Jonel Lonni SQUIBB, MD  Active   metoprolol  tartrate (LOPRESSOR ) 50 MG tablet [id number]  TAKE 1 TABLET BY MOUTH IN THE  MORNING Nahser, Aleene PARAS, MD  Active Self, Pharmacy Records  Multiple Vitamins-Minerals (CENTRUM SILVER ADULT 50+) TABS [id number]  Take 1 tablet by mouth in the morning. [provider]  Active Self, Pharmacy Records  omeprazole  (PRILOSEC) 20 MG capsule [id number]  TAKE 2 CAPSULES BY MOUTH EVERY DAY  Patient taking differently: Take 40 mg by mouth daily as needed (reflux).   Nahser, Aleene PARAS, MD  Active Self, Pharmacy Records  potassium chloride  (KLOR-CON ) 10 MEQ tablet [id number]  Take 2 tablets (20 mEq total) by mouth 2 (two) times daily. Nahser, Aleene PARAS, MD  Active Self, Pharmacy Records  spironolactone  (ALDACTONE ) 25 MG tablet [id number]  Take 1 tablet (25 mg total) by mouth daily. Nahser, Aleene PARAS, MD  Active Self, Pharmacy Records  torsemide  (DEMADEX ) 20 MG tablet [id number]  Take 1 tablet (20 mg total) by mouth daily.  Patient not taking: Reported on 07/19/2023   Nahser, Aleene PARAS, MD  Active Self, Pharmacy Records  triamterene -hydrochlorothiazide (MAXZIDE-25) 37.5-25 MG tablet [id number]  Take 1 tablet by mouth daily. [provider]  Active Self, Pharmacy Records  warfarin (COUMADIN ) 5 MG tablet [id number]  TAKE 1 TABLET BY MOUTH DAILY OR AS DIRECTED BY COUMADIN  CLINIC  Patient taking differently: Take 5 mg by mouth daily.   Nahser, Aleene PARAS, MD  Active Self, Pharmacy Records            Home Care  and Equipment/Supplies: Were Home Health Services Ordered?: NA Any new equipment or medical supplies ordered?: NA  Functional Questionnaire: Do you need assistance with bathing/showering or dressing?: No Do you need assistance with meal preparation?: No Do you need assistance with eating?: No Do you  have difficulty maintaining continence: No Do you need assistance with getting out of bed/getting out of a chair/moving?: No Do you have difficulty managing or taking your medications?: Yes (The patient has friend who is administering her Lovenox injections for five days)  Follow up appointments reviewed: PCP Follow-up appointment confirmed?: No (The patient will call today to schedule an appointment with [facility]) MD Provider Line [phone] Given: No Specialist Hospital Follow-up appointment confirmed?: Yes Date of Specialist follow-up appointment?: 08/05/23 Follow-Up Specialty Provider:: Coumadin  clinic Do you need transportation to your follow-up appointment?: No Do you understand care options if your condition(s) worsen?: Yes-patient verbalized understanding  SDOH Interventions Today    Flowsheet Row Most Recent Value  SDOH Interventions   Food Insecurity Interventions Intervention Not Indicated  Housing Interventions Intervention Not Indicated  Transportation Interventions Intervention Not Indicated  Utilities Interventions Intervention Not Indicated   Medford Balboa, BSN, RN [place]  [facility] - [facility] RN Care Manager [phone]

## 2023-07-22 ENCOUNTER — Inpatient Hospital Stay (HOSPITAL_COMMUNITY)
Admission: EM | Admit: 2023-07-22 | Discharge: 2023-08-08 | DRG: 020 | Disposition: A | Discharge status: Home Health | Attending: Internal Medicine | Admitting: Internal Medicine

## 2023-07-22 ENCOUNTER — Ambulatory Visit: Attending: Cardiology

## 2023-07-22 ENCOUNTER — Emergency Department (HOSPITAL_COMMUNITY)

## 2023-07-22 ENCOUNTER — Other Ambulatory Visit: Payer: Self-pay

## 2023-07-22 ENCOUNTER — Encounter (HOSPITAL_COMMUNITY): Payer: Self-pay | Admitting: *Deleted

## 2023-07-22 DIAGNOSIS — I472 Ventricular tachycardia, unspecified: Secondary | ICD-10-CM | POA: Diagnosis not present

## 2023-07-22 DIAGNOSIS — D72828 Other elevated white blood cell count: Secondary | ICD-10-CM | POA: Diagnosis not present

## 2023-07-22 DIAGNOSIS — I6201 Nontraumatic acute subdural hemorrhage: Principal | ICD-10-CM | POA: Diagnosis present

## 2023-07-22 DIAGNOSIS — I10 Essential (primary) hypertension: Secondary | ICD-10-CM | POA: Diagnosis not present

## 2023-07-22 DIAGNOSIS — Z5181 Encounter for therapeutic drug level monitoring: Secondary | ICD-10-CM

## 2023-07-22 DIAGNOSIS — I4819 Other persistent atrial fibrillation: Secondary | ICD-10-CM | POA: Diagnosis not present

## 2023-07-22 DIAGNOSIS — S065XAA Traumatic subdural hemorrhage with loss of consciousness status unknown, initial encounter: Secondary | ICD-10-CM | POA: Diagnosis not present

## 2023-07-22 DIAGNOSIS — I5040 Unspecified combined systolic (congestive) and diastolic (congestive) heart failure: Secondary | ICD-10-CM | POA: Diagnosis not present

## 2023-07-22 DIAGNOSIS — K5900 Constipation, unspecified: Secondary | ICD-10-CM | POA: Diagnosis not present

## 2023-07-22 DIAGNOSIS — Z823 Family history of stroke: Secondary | ICD-10-CM | POA: Diagnosis not present

## 2023-07-22 DIAGNOSIS — Z6841 Body Mass Index (BMI) 40.0 and over, adult: Secondary | ICD-10-CM | POA: Diagnosis not present

## 2023-07-22 DIAGNOSIS — G40909 Epilepsy, unspecified, not intractable, without status epilepticus: Secondary | ICD-10-CM | POA: Diagnosis present

## 2023-07-22 DIAGNOSIS — R519 Headache, unspecified: Secondary | ICD-10-CM | POA: Diagnosis not present

## 2023-07-22 DIAGNOSIS — I69354 Hemiplegia and hemiparesis following cerebral infarction affecting left non-dominant side: Secondary | ICD-10-CM

## 2023-07-22 DIAGNOSIS — I110 Hypertensive heart disease with heart failure: Secondary | ICD-10-CM | POA: Diagnosis not present

## 2023-07-22 DIAGNOSIS — R791 Abnormal coagulation profile: Secondary | ICD-10-CM | POA: Diagnosis not present

## 2023-07-22 DIAGNOSIS — E785 Hyperlipidemia, unspecified: Secondary | ICD-10-CM | POA: Diagnosis not present

## 2023-07-22 DIAGNOSIS — G935 Compression of brain: Secondary | ICD-10-CM | POA: Diagnosis present

## 2023-07-22 DIAGNOSIS — I5022 Chronic systolic (congestive) heart failure: Secondary | ICD-10-CM

## 2023-07-22 DIAGNOSIS — G44209 Tension-type headache, unspecified, not intractable: Secondary | ICD-10-CM | POA: Diagnosis present

## 2023-07-22 DIAGNOSIS — Z952 Presence of prosthetic heart valve: Secondary | ICD-10-CM

## 2023-07-22 DIAGNOSIS — I63511 Cerebral infarction due to unspecified occlusion or stenosis of right middle cerebral artery: Secondary | ICD-10-CM | POA: Diagnosis not present

## 2023-07-22 DIAGNOSIS — I480 Paroxysmal atrial fibrillation: Secondary | ICD-10-CM | POA: Diagnosis not present

## 2023-07-22 DIAGNOSIS — Z8249 Family history of ischemic heart disease and other diseases of the circulatory system: Secondary | ICD-10-CM | POA: Diagnosis not present

## 2023-07-22 DIAGNOSIS — I509 Heart failure, unspecified: Secondary | ICD-10-CM | POA: Diagnosis not present

## 2023-07-22 DIAGNOSIS — I5042 Chronic combined systolic (congestive) and diastolic (congestive) heart failure: Secondary | ICD-10-CM | POA: Diagnosis not present

## 2023-07-22 DIAGNOSIS — Z7901 Long term (current) use of anticoagulants: Secondary | ICD-10-CM | POA: Diagnosis not present

## 2023-07-22 DIAGNOSIS — Z880 Allergy status to penicillin: Secondary | ICD-10-CM | POA: Diagnosis not present

## 2023-07-22 DIAGNOSIS — Z8673 Personal history of transient ischemic attack (TIA), and cerebral infarction without residual deficits: Secondary | ICD-10-CM | POA: Diagnosis not present

## 2023-07-22 DIAGNOSIS — I6389 Other cerebral infarction: Secondary | ICD-10-CM | POA: Diagnosis not present

## 2023-07-22 DIAGNOSIS — I4821 Permanent atrial fibrillation: Secondary | ICD-10-CM | POA: Diagnosis not present

## 2023-07-22 DIAGNOSIS — Z87891 Personal history of nicotine dependence: Secondary | ICD-10-CM | POA: Diagnosis not present

## 2023-07-22 DIAGNOSIS — R531 Weakness: Secondary | ICD-10-CM | POA: Diagnosis not present

## 2023-07-22 DIAGNOSIS — I482 Chronic atrial fibrillation, unspecified: Secondary | ICD-10-CM | POA: Diagnosis not present

## 2023-07-22 DIAGNOSIS — Z79899 Other long term (current) drug therapy: Secondary | ICD-10-CM | POA: Diagnosis not present

## 2023-07-22 DIAGNOSIS — K219 Gastro-esophageal reflux disease without esophagitis: Secondary | ICD-10-CM | POA: Diagnosis not present

## 2023-07-22 DIAGNOSIS — G9389 Other specified disorders of brain: Secondary | ICD-10-CM | POA: Diagnosis not present

## 2023-07-22 DIAGNOSIS — E871 Hypo-osmolality and hyponatremia: Secondary | ICD-10-CM | POA: Diagnosis present

## 2023-07-22 DIAGNOSIS — R569 Unspecified convulsions: Secondary | ICD-10-CM | POA: Diagnosis not present

## 2023-07-22 DIAGNOSIS — S065X0A Traumatic subdural hemorrhage without loss of consciousness, initial encounter: Secondary | ICD-10-CM | POA: Diagnosis not present

## 2023-07-22 DIAGNOSIS — I6200 Nontraumatic subdural hemorrhage, unspecified: Secondary | ICD-10-CM | POA: Diagnosis not present

## 2023-07-22 DIAGNOSIS — S065X1A Traumatic subdural hemorrhage with loss of consciousness of 30 minutes or less, initial encounter: Secondary | ICD-10-CM | POA: Diagnosis not present

## 2023-07-22 LAB — CBC WITH DIFFERENTIAL/PLATELET
Abs Immature Granulocytes: 0.04 10*3/uL (ref 0.00–0.07)
Basophils Absolute: 0 10*3/uL (ref 0.0–0.1)
Basophils Relative: 0 %
Eosinophils Absolute: 0 10*3/uL (ref 0.0–0.5)
Eosinophils Relative: 0 %
HCT: 40.4 % (ref 36.0–46.0)
Hemoglobin: 12.5 g/dL (ref 12.0–15.0)
Immature Granulocytes: 0 %
Lymphocytes Relative: 6 %
Lymphs Abs: 0.7 10*3/uL (ref 0.7–4.0)
MCH: 30.8 pg (ref 26.0–34.0)
MCHC: 30.9 g/dL (ref 30.0–36.0)
MCV: 99.5 fL (ref 80.0–100.0)
Monocytes Absolute: 0.7 10*3/uL (ref 0.1–1.0)
Monocytes Relative: 6 %
Neutro Abs: 9.2 10*3/uL — ABNORMAL HIGH (ref 1.7–7.7)
Neutrophils Relative %: 88 %
Platelets: 225 10*3/uL (ref 150–400)
RBC: 4.06 MIL/uL (ref 3.87–5.11)
RDW: 13.1 % (ref 11.5–15.5)
WBC: 10.6 10*3/uL — ABNORMAL HIGH (ref 4.0–10.5)
nRBC: 0 % (ref 0.0–0.2)

## 2023-07-22 LAB — BASIC METABOLIC PANEL WITH GFR
Anion gap: 11 (ref 5–15)
BUN: 11 mg/dL (ref 8–23)
CO2: 25 mmol/L (ref 22–32)
Calcium: 10 mg/dL (ref 8.9–10.3)
Chloride: 99 mmol/L (ref 98–111)
Creatinine, Ser: 0.93 mg/dL (ref 0.44–1.00)
GFR, Estimated: >60 mL/min (ref >60)
Glucose, Bld: 135 mg/dL — ABNORMAL HIGH (ref 70–99)
Potassium: 5 mmol/L (ref 3.5–5.1)
Sodium: 135 mmol/L (ref 135–145)

## 2023-07-22 LAB — PROTIME-INR
INR: 2.1 — ABNORMAL HIGH (ref 0.8–1.2)
Prothrombin Time: 24.7 s — ABNORMAL HIGH (ref 11.4–15.2)

## 2023-07-22 LAB — POCT INR: INR: 2.8 (ref 2.0–3.0)

## 2023-07-22 MED ORDER — TRIAMTERENE-HCTZ 37.5-25 MG PO TABS
1.0000 | ORAL_TABLET | Freq: Every day | ORAL | Status: DC
  Administered 2023-07-23: 1 via ORAL
  Filled 2023-07-22: qty 1

## 2023-07-22 MED ORDER — PANTOPRAZOLE SODIUM 40 MG PO TBEC
40.0000 mg | DELAYED_RELEASE_TABLET | Freq: Every day | ORAL | Status: DC
  Administered 2023-07-23 – 2023-08-08 (×17): 40 mg via ORAL
  Filled 2023-07-22 (×17): qty 1

## 2023-07-22 MED ORDER — MORPHINE SULFATE (PF) 2 MG/ML IV SOLN
2.0000 mg | INTRAVENOUS | Status: DC | PRN
  Filled 2023-07-22: qty 1

## 2023-07-22 MED ORDER — ONDANSETRON HCL 4 MG PO TABS
4.0000 mg | ORAL_TABLET | Freq: Four times a day (QID) | ORAL | Status: DC | PRN
Start: 2023-07-22 — End: 2023-07-23

## 2023-07-22 MED ORDER — SODIUM CHLORIDE 0.9 % IV SOLN: 250.0000 mL | INTRAVENOUS | Status: AC | PRN

## 2023-07-22 MED ORDER — METOPROLOL TARTRATE 25 MG PO TABS
25.0000 mg | ORAL_TABLET | Freq: Two times a day (BID) | ORAL | Status: DC
  Administered 2023-07-23: 25 mg via ORAL
  Filled 2023-07-22: qty 1

## 2023-07-22 MED ORDER — LEVETIRACETAM 500 MG PO TABS
500.0000 mg | ORAL_TABLET | Freq: Two times a day (BID) | ORAL | Status: DC
  Administered 2023-07-23: 500 mg via ORAL
  Filled 2023-07-22: qty 1

## 2023-07-22 MED ORDER — MORPHINE SULFATE (PF) 2 MG/ML IV SOLN: 2.0000 mg | INTRAVENOUS | Status: DC | PRN

## 2023-07-22 MED ORDER — OXYCODONE-ACETAMINOPHEN 5-325 MG PO TABS
1.0000 | ORAL_TABLET | Freq: Once | ORAL | Status: AC
  Administered 2023-07-22: 1 via ORAL
  Filled 2023-07-22: qty 1

## 2023-07-22 MED ORDER — OXYCODONE HCL 5 MG PO TABS: 5.0000 mg | ORAL_TABLET | Freq: Four times a day (QID) | ORAL | Status: DC | PRN

## 2023-07-22 MED ORDER — ATORVASTATIN CALCIUM 10 MG PO TABS
20.0000 mg | ORAL_TABLET | Freq: Every day | ORAL | Status: DC
  Administered 2023-07-23 – 2023-08-07 (×17): 20 mg via ORAL
  Filled 2023-07-22 (×17): qty 2

## 2023-07-22 MED ORDER — METOPROLOL TARTRATE 25 MG PO TABS: 50.0000 mg | ORAL_TABLET | Freq: Every morning | ORAL | Status: DC

## 2023-07-22 MED ORDER — SODIUM CHLORIDE 0.9% FLUSH
3.0000 mL | Freq: Two times a day (BID) | INTRAVENOUS | Status: DC
  Administered 2023-07-23 – 2023-08-08 (×23): 3 mL via INTRAVENOUS

## 2023-07-22 MED ORDER — SPIRONOLACTONE 12.5 MG HALF TABLET
25.0000 mg | ORAL_TABLET | Freq: Every day | ORAL | Status: DC
  Filled 2023-07-22: qty 2

## 2023-07-22 MED ORDER — ACETAMINOPHEN 500 MG PO TABS
1000.0000 mg | ORAL_TABLET | Freq: Once | ORAL | Status: AC
  Administered 2023-07-22: 1000 mg via ORAL
  Filled 2023-07-22: qty 2

## 2023-07-22 MED ORDER — SODIUM CHLORIDE 0.9% FLUSH: 3.0000 mL | INTRAVENOUS | Status: DC | PRN

## 2023-07-22 MED ORDER — ACETAMINOPHEN 650 MG RE SUPP: 650.0000 mg | Freq: Four times a day (QID) | RECTAL | Status: DC | PRN

## 2023-07-22 MED ORDER — SODIUM CHLORIDE 0.9% FLUSH
3.0000 mL | Freq: Two times a day (BID) | INTRAVENOUS | Status: DC
  Administered 2023-07-23 – 2023-08-08 (×32): 3 mL via INTRAVENOUS

## 2023-07-22 MED ORDER — ONDANSETRON HCL 4 MG/2ML IJ SOLN: 4.0000 mg | Freq: Four times a day (QID) | INTRAMUSCULAR | Status: DC | PRN

## 2023-07-22 MED ORDER — FUROSEMIDE 40 MG PO TABS
40.0000 mg | ORAL_TABLET | Freq: Every day | ORAL | Status: DC
  Administered 2023-07-23 – 2023-08-08 (×16): 40 mg via ORAL
  Filled 2023-07-22 (×8): qty 1
  Filled 2023-07-22: qty 2
  Filled 2023-07-22 (×7): qty 1

## 2023-07-22 MED ORDER — ACETAMINOPHEN 325 MG PO TABS
650.0000 mg | ORAL_TABLET | Freq: Four times a day (QID) | ORAL | Status: DC | PRN
  Administered 2023-07-23 – 2023-08-08 (×26): 650 mg via ORAL
  Filled 2023-07-22 (×26): qty 2

## 2023-07-22 MED ORDER — HYDRALAZINE HCL 20 MG/ML IJ SOLN: 10.0000 mg | Freq: Four times a day (QID) | INTRAMUSCULAR | Status: DC | PRN

## 2023-07-22 NOTE — ED Provider Triage Note (Signed)
 Emergency Medicine Provider Triage Evaluation Note  Natasha Davis , a 68 y.o. female  was evaluated in triage.  Pt complains of headache. Progressive worsening headache x 5 days.  Recently had a stroke and currently on lovenox .  Also on coumadin .  Headache is getting worse now with dizziness.  No new numbness or weakness.  No vision complaint  Review of Systems  Positive: As above Negative: As above  Physical Exam  BP (!) 171/78 (BP Location: Left Arm)   Pulse 63   Temp 99.5 F (37.5 C)   Resp 20   Ht 4' 11 (1.499 m)   Wt 96.2 kg   SpO2 98%   BMI 42.84 kg/m  Gen:   Awake, no distress   Resp:  Normal effort  MSK:   Moves extremities without difficulty  Other:    Medical Decision Making  Medically screening exam initiated at 7:52 PM.  Appropriate orders placed.  Pleasant W Batra was informed that the remainder of the evaluation will be completed by another provider, this initial triage assessment does not replace that evaluation, and the importance of remaining in the ED until their evaluation is complete.     Nivia Colon, PA-C 07/22/23 1954

## 2023-07-22 NOTE — Consult Note (Signed)
 Cardiology Consultation   Patient ID: Natasha Davis MRN: [id number]; DOB: 06-Feb-1955  Admit date: 07/22/2023 Date of Consult: 07/22/2023  PCP:  Marvine Rush, MD    [facility] Providers Cardiologist:  Aleene Passe, MD  PV Cardiologist:  Deatrice Cage, MD  { Click here to update MD or APP on Care Team, Refresh:1}     Patient Profile: Natasha Davis is a 68 y.o. female with a hx of persistent/permanent A-fib on Coumadin , mechanical mitral valve on Coumadin , history of CVA, history of HFrEF, EF 40 to 45%, nuclear stress test 2024 apical fixed perfusion defect no ischemia who is being seen 07/22/2023 for the evaluation of anticoagulation recommendation given subdural hematoma and patient has mechanical mitral valve on Coumadin  at the request of Dr Lee.  History of Present Illness: Natasha Davis is a 68 y.o. female with a hx of persistent/permanent A-fib on Coumadin , mechanical mitral valve on Coumadin , history of CVA, history of HFrEF, EF 40 to 45%, nuclear stress test 2024 apical fixed perfusion defect no ischemia who is being seen 07/22/2023 for the evaluation of anticoagulation recommendation given subdural hematoma and patient has mechanical mitral valve on Coumadin   Patient was just discharged on 6/25 after presentation of dysarthria, left-sided weakness, reportedly fell on the floor in the kitchen with 1 manage seizure-like activity, started on Keppra , MRI concerning for subdural hematoma but repeat CT head x 2 negative, sent home with Lovenox  bridging and warfarin.  INR was 2.1 on discharge.  Comes back now with complaints of headache which is persistent since discharge and dizziness.  He ER CT scan of head noted acute bilateral subdural hematomas measuring 8 mm on the right and 10 mm on the left, hemorrhagic tracts along falx 25 mm right word midline shift noted. Patient was reversed given her INR 2.1 and was on Coumadin , neurosurgery consulted, anticoagulation  held. Cardiology is consulted for anticoagulation recommendations and mechanical mitral valve and A-fib recommendations  EKG 07/22/23 shows atrial fibrillation, baseline artifact Recent echo 07/15/2023 LVEF 40 to 45%, RVE with mildly reduced with normal function, mechanical mitral valve in place, mean gradient 4 mmHg  Stress test 12/2022 fixed perfusion defect in 5, small size, mild intensity, no reversible ischemia, LVEF 52%   Past Medical History:  Diagnosis Date   Chronic anticoagulation    Claudication (HCC)    History of CVA (cerebrovascular accident)    Hypertension    Mitral stenosis    Seizure (HCC)    HX    Past Surgical History:  Procedure Laterality Date   CARDIAC CATHETERIZATION  06/01/96   NORMAL LEFT VENTRICULAR SYSTOLIC FUNCTION. EF 60%   CESAREAN SECTION     X2   MITRAL VALVE REPLACEMENT       Home Medications:  Prior to Admission medications   Medication Sig Start Date End Date Taking? Authorizing Provider  acetaminophen  (TYLENOL ) 650 MG CR tablet Take 1,300 mg by mouth daily as needed for pain. Pain    [provider]  atorvastatin  (LIPITOR) 20 MG tablet TAKE 1 TABLET BY MOUTH EVERY DAY Patient taking differently: Take 20 mg by mouth at bedtime. 11/03/22   Nahser, Aleene PARAS, MD  enoxaparin  (LOVENOX ) 100 MG/ML injection Inject 1 mL (100 mg total) into the skin every 12 (twelve) hours. 07/20/23   Danford, Lonni SQUIBB, MD  furosemide  (LASIX ) 40 MG tablet Take 40 mg by mouth daily. Patient not taking: Reported on 07/21/2023 06/06/23   [provider]  levETIRAcetam  (KEPPRA ) 500 MG tablet Take  1 tablet (500 mg total) by mouth 2 (two) times daily. 07/20/23   Danford, Lonni SQUIBB, MD  metoprolol  tartrate (LOPRESSOR ) 50 MG tablet TAKE 1 TABLET BY MOUTH IN THE  MORNING 05/17/23   Nahser, Aleene PARAS, MD  Multiple Vitamins-Minerals (CENTRUM SILVER ADULT 50+) TABS Take 1 tablet by mouth in the morning.    [provider]  omeprazole  (PRILOSEC) 20 MG  capsule TAKE 2 CAPSULES BY MOUTH EVERY DAY Patient taking differently: Take 40 mg by mouth daily as needed (reflux). 11/11/22   Nahser, Aleene PARAS, MD  potassium chloride  (KLOR-CON ) 10 MEQ tablet Take 2 tablets (20 mEq total) by mouth 2 (two) times daily. 02/01/23   Nahser, Aleene PARAS, MD  spironolactone  (ALDACTONE ) 25 MG tablet Take 1 tablet (25 mg total) by mouth daily. 03/28/23   Nahser, Aleene PARAS, MD  torsemide  (DEMADEX ) 20 MG tablet Take 1 tablet (20 mg total) by mouth daily. Patient not taking: Reported on 07/21/2023 03/30/23   Nahser, Aleene PARAS, MD  triamterene -hydrochlorothiazide (MAXZIDE-25) 37.5-25 MG tablet Take 1 tablet by mouth daily. 06/14/23   [provider]  warfarin (COUMADIN ) 5 MG tablet TAKE 1 TABLET BY MOUTH DAILY OR AS DIRECTED BY COUMADIN  CLINIC Patient taking differently: Take 5 mg by mouth daily. 06/02/23   Nahser, Aleene PARAS, MD    Scheduled Meds:  atorvastatin   20 mg Oral QHS   [START ON 07/23/2023] furosemide   40 mg Oral Daily   levETIRAcetam   500 mg Oral BID   [START ON 07/23/2023] metoprolol  tartrate  50 mg Oral q morning   [START ON 07/23/2023] pantoprazole   40 mg Oral Daily   sodium chloride  flush  3 mL Intravenous Q12H   sodium chloride  flush  3 mL Intravenous Q12H   spironolactone   25 mg Oral Daily   triamterene -hydrochlorothiazide  1 tablet Oral Daily   Continuous Infusions:  sodium chloride      PRN Meds: sodium chloride , acetaminophen  **OR** acetaminophen , hydrALAZINE, morphine injection, ondansetron **OR** ondansetron (ZOFRAN) IV, sodium chloride  flush  Allergies:    Allergies  Allergen Reactions   Penicillins Other (See Comments)    Couldn't walk    Social History:   Social History   Socioeconomic History   Marital status: Single    Spouse name: Not on file   Number of children: Not on file   Years of education: Not on file   Highest education level: Not on file  Occupational History   Not on file  Tobacco Use   Smoking status: Former     Current packs/day: 0.00    Types: Cigarettes    Quit date: 01/25/2006    Years since quitting: 17.4   Smokeless tobacco: Never  Substance and Sexual Activity   Alcohol use: Not Currently   Drug use: Not Currently   Sexual activity: Never  Other Topics Concern   Not on file  Social History Narrative   ** Merged History Encounter **       Social Drivers of Health   Financial Resource Strain: Not on file  Food Insecurity: No Food Insecurity (07/21/2023)   Hunger Vital Sign    Worried About Running Out of Food in the Last Year: Never true    Ran Out of Food in the Last Year: Never true  Transportation Needs: No Transportation Needs (07/21/2023)   PRAPARE - Administrator, Civil Service (Medical): No    Lack of Transportation (Non-Medical): No  Physical Activity: Not on file  Stress: Not on file  Social Connections: Moderately Integrated (07/20/2023)   Social Connection and Isolation Panel    Frequency of Communication with Friends and Family: More than three times a week    Frequency of Social Gatherings with Friends and Family: More than three times a week    Attends Religious Services: More than 4 times per year    Active Member of [employer] or Organizations: Yes    Attends Engineer, structural: More than 4 times per year    Marital Status: Divorced  Intimate Partner Violence: Not At Risk (07/21/2023)   Humiliation, Afraid, Rape, and Kick questionnaire    Fear of Current or Ex-Partner: No    Emotionally Abused: No    Physically Abused: No    Sexually Abused: No    Family History:    Family History  Problem Relation Age of Onset   Hypertension Mother    Heart disease Father    Hypertension Father    Heart attack Father    Stroke Brother      ROS:  Please see the history of present illness.   All other ROS reviewed and negative.     Physical Exam/Data: Vitals:   07/22/23 1926 07/22/23 1935 07/22/23 2230  BP: (!) 171/78  (!) 172/80  Pulse: 63  81   Resp: 20  (!) 30  Temp: 99.5 F (37.5 C)    SpO2: 98%  98%  Weight:  96.2 kg   Height:  4' 11 (1.499 m)    No intake or output data in the 24 hours ending 07/22/23 2302    07/22/2023    7:35 PM 07/21/2023   10:41 AM 07/18/2023   10:31 AM  Last 3 Weights  Weight (lbs) 212 lb 1.3 oz 212 lb 212 lb 1.3 oz  Weight (kg) 96.2 kg 96.163 kg 96.2 kg     Body mass index is 42.84 kg/m.  General:  Well nourished, well developed, in no acute distress HEENT: normal Neck: no JVD Vascular: No carotid bruits; Distal pulses 2+ bilaterally Cardiac:  normal S1, S2; mechanical mitral valve click noted Lungs:  clear to auscultation bilaterally, no wheezing, rhonchi or rales  Abd: soft, nontender, no hepatomegaly  Ext: no edema Musculoskeletal:  No deformities, BUE and BLE strength normal and equal Skin: warm and dry  Neuro:  CNs 2-12 intact, no focal abnormalities noted Psych:  Normal affect     Laboratory Data: High Sensitivity Troponin:  No results for input(s): TROPONINIHS in the last 720 hours.   Chemistry Recent Labs  Lab 07/18/23 1047 07/18/23 1215 07/19/23 0443 07/22/23 2014  NA 137 138 135 135  K 3.7 3.6 3.8 5.0  CL 99 101 104 99  CO2 21*  --  22 25  GLUCOSE 171* 164* 138* 135*  BUN 17 19 12 11   CREATININE 1.14* 1.10* 0.97 0.93  CALCIUM  9.3  --  8.4* 10.0  GFRNONAA 52*  --  >60 >60  ANIONGAP 17*  --  9 11    Recent Labs  Lab 07/18/23 1047 07/19/23 0443  PROT 7.7 6.1*  ALBUMIN 4.2 3.5  AST 41 35  ALT 25 21  ALKPHOS 102 85  BILITOT 1.0 1.1   Lipids  Recent Labs  Lab 07/19/23 0443  CHOL 102  TRIG 57  HDL 39*  LDLCALC 52  CHOLHDL 2.6    Hematology Recent Labs  Lab 07/19/23 0443 07/20/23 0433 07/22/23 2014  WBC 7.3 5.5 10.6*  RBC 3.41* 3.37* 4.06  HGB 10.5* 10.3* 12.5  HCT 32.9* 32.7* 40.4  MCV 96.5 97.0 99.5  MCH 30.8 30.6 30.8  MCHC 31.9 31.5 30.9  RDW 13.2 13.2 13.1  PLT 205 201 225   Thyroid  No results for input(s): TSH, FREET4 in the  last 168 hours.  BNPNo results for input(s): BNP, PROBNP in the last 168 hours.  DDimer No results for input(s): DDIMER in the last 168 hours.  Radiology/Studies:  CT Head Wo Contrast Result Date: 07/22/2023 CLINICAL DATA:  Headache, tension-type recent stroke, on blood thinner EXAM: CT HEAD WITHOUT CONTRAST TECHNIQUE: Contiguous axial images were obtained from the base of the skull through the vertex without intravenous contrast. RADIATION DOSE REDUCTION: This exam was performed according to the departmental dose-optimization program which includes automated exposure control, adjustment of the mA and/or kV according to patient size and/or use of iterative reconstruction technique. COMPARISON:  CT head July 18, 2023. FINDINGS: Brain: Acute subdural hematomas along bilateral cerebral convexities measuring up to 8 mm in thickness on the right and 10 mm on the left. Acute hemorrhage tracks along the falx and bilateral tentorial leaflets with tentorial hemorrhage measuring 5 mm in thickness on the left and 3 mm on the right. Associated mass effect with 5 mm of rightward midline shift. Remote right MCA territory infarct, unchanged. Small remote left parietal infarct. No evidence of acute large vascular territory infarct or hydrocephalus. Vascular: No hyperdense vessel identified. Skull: No acute fracture. Sinuses/Orbits: Clear sinuses.  No acute orbital findings. IMPRESSION: Acute bilateral subdural hematomas measuring 8 mm on the right and till meters on the left. Hemorrhage tracks along the falx and tentorial leaflets. Associated 5 mm of rightward midline shift. Findings discussed with Dr. Patsey via telephone at 9:31 p.m. Electronically Signed   By: Gilmore GORMAN Molt M.D.   On: 07/22/2023 21:35   ECHOCARDIOGRAM COMPLETE Result Date: 07/19/2023    ECHOCARDIOGRAM REPORT   Patient Name:   ALONIE GAZZOLA Date of Exam: 07/19/2023 Medical Rec #:  [id number]        Height:       59.0 in Accession #:     [id number]       Weight:       212.1 lb Date of Birth:  03/26/1955        BSA:          1.891 m Patient Age:    67 years         BP:           170/62 mmHg Patient Gender: F                HR:           72 bpm. Exam Location:  Inpatient Procedure: 2D Echo, Cardiac Doppler, Color Doppler and Intracardiac            Opacification Agent (Both Spectral and Color Flow Doppler were            utilized during procedure). Indications:    Stroke I63.9  History:        Patient has prior history of Echocardiogram examinations, most                 recent 01/25/2023. 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 Procedure date:                 04/22/2010, Arrythmias:Atrial Fibrillation; Risk  Factors:Hypertension.  Sonographer:    Koleen Popper RDCS Referring Phys: [id number] MARSA NOVAK MELVIN IMPRESSIONS  1. Left ventricular ejection fraction, by estimation, is 40 to 45%. The left ventricle has mildly decreased function. The left ventricle demonstrates global hypokinesis. There is moderate concentric left ventricular hypertrophy. Left ventricular diastolic parameters are indeterminate.  2. Right ventricular systolic function is mildly reduced. The right ventricular size is normal. There is normal pulmonary artery systolic pressure.  3. Left atrial size was severely dilated.  4. The mitral valve has been repaired/replaced. No evidence of mitral valve regurgitation. The mean mitral valve gradient is 4.3 mmHg.  5. The aortic valve is tricuspid. There is mild calcification of the aortic valve. Aortic valve regurgitation is not visualized. Aortic valve sclerosis is present, with no evidence of aortic valve stenosis.  6. The inferior vena cava is normal in size with <50% respiratory variability, suggesting right atrial pressure of 8 mmHg. Comparison(s): RVSP has improved from prior study; there is no clear evidence of interatrial shunting. FINDINGS  Left  Ventricle: Left ventricular ejection fraction, by estimation, is 40 to 45%. The left ventricle has mildly decreased function. The left ventricle demonstrates global hypokinesis. Definity  contrast agent was given IV to delineate the left ventricular  endocardial borders. The left ventricular internal cavity size was normal in size. There is moderate concentric left ventricular hypertrophy. Left ventricular diastolic parameters are indeterminate. Right Ventricle: The right ventricular size is normal. No increase in right ventricular wall thickness. Right ventricular systolic function is mildly reduced. There is normal pulmonary artery systolic pressure. The tricuspid regurgitant velocity is 2.41 m/s, and with an assumed right atrial pressure of 8 mmHg, the estimated right ventricular systolic pressure is 31.2 mmHg. Left Atrium: Left atrial size was severely dilated. Right Atrium: Right atrial size was not well visualized. Pericardium: There is no evidence of pericardial effusion. Mitral Valve: The mitral valve has been repaired/replaced. No evidence of mitral valve regurgitation. MV peak gradient, 15.3 mmHg. The mean mitral valve gradient is 4.3 mmHg. Tricuspid Valve: The tricuspid valve is normal in structure. Tricuspid valve regurgitation is mild . No evidence of tricuspid stenosis. Aortic Valve: The aortic valve is tricuspid. There is mild calcification of the aortic valve. There is mild aortic valve annular calcification. Aortic valve regurgitation is not visualized. Aortic valve sclerosis is present, with no evidence of aortic valve stenosis. Pulmonic Valve: The pulmonic valve was not well visualized. Pulmonic valve regurgitation is not visualized. No evidence of pulmonic stenosis. Aorta: The aortic root and ascending aorta are structurally normal, with no evidence of dilitation. Venous: The inferior vena cava is normal in size with less than 50% respiratory variability, suggesting right atrial pressure of 8  mmHg. IAS/Shunts: The atrial septum is grossly normal.  LEFT VENTRICLE PLAX 2D LVIDd:         4.90 cm LVIDs:         4.20 cm LV PW:         1.20 cm LV IVS:        1.30 cm LVOT diam:     1.90 cm LV SV:         40 LV SV Index:   21 LVOT Area:     2.84 cm  RIGHT VENTRICLE            IVC RV S prime:     7.24 cm/s  IVC diam: 2.10 cm TAPSE (M-mode): 1.1 cm LEFT ATRIUM  Index LA diam:        5.80 cm  3.07 cm/m LA Vol (A2C):   121.0 ml 63.99 ml/m LA Vol (A4C):   69.6 ml  36.81 ml/m LA Biplane Vol: 92.1 ml  48.71 ml/m  AORTIC VALVE LVOT Vmax:   63.70 cm/s LVOT Vmean:  46.300 cm/s LVOT VTI:    0.142 m  AORTA Ao Root diam: 2.90 cm Ao Asc diam:  3.30 cm MITRAL VALVE             TRICUSPID VALVE MV Area VTI:  1.06 cm   TR Peak grad:   23.2 mmHg MV Peak grad: 15.3 mmHg  TR Vmax:        241.00 cm/s MV Mean grad: 4.3 mmHg MV Vmax:      1.96 m/s   SHUNTS MV Vmean:     84.0 cm/s  Systemic VTI:  0.14 m                          Systemic Diam: 1.90 cm Stanly Leavens MD Electronically signed by Stanly Leavens MD Signature Date/Time: 07/19/2023/11:55:42 AM    Final    Overnight EEG with video Result Date: 07/19/2023 Shelton Arlin KIDD, MD     07/21/2023 12:12 PM Patient Name: Idella W Frumkin MRN: [id number] Epilepsy Attending: Arlin KIDD Shelton Referring Physician/Provider: Voncile Isles, MD Duration: 07/18/2023 1653 to 07/19/2023 1200  Patient history:  67 y.o. 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 EEG to evaluate for seizure.  Level of alertness: Awake, asleep  AEDs during EEG study: LEV  Technical aspects: This EEG study was done with scalp electrodes positioned according to the 10-20 International system of electrode placement.  Electrical activity was reviewed with band pass filter of 1-70Hz , sensitivity of 7 uV/mm, display speed of 21mm/sec with a 60Hz  notched filter applied as appropriate. EEG data were recorded continuously and digitally stored. Video monitoring was available and reviewed as appropriate.  Description: The posterior dominant rhythm consists of 8-9 Hz activity of moderate voltage (25-35 uV) seen predominantly in posterior head regions, symmetric and reactive to eye opening and eye closing. Sleep was characterized by vertex waves, sleep spindles (12 to 14 Hz), maximal frontocentral region. Hyperventilation and photic stimulation were not performed.    IMPRESSION: This study is within normal limits. No seizures or epileptiform discharges were seen throughout the recording.  Priyanka O Yadav     Assessment and Plan: Acute bilateral subdural hematoma with 5 mm midline shift. Recent fall and seizures. Mechanical mitral valve on Coumadin  Permanent A-fib on Coumadin  History of prior CVA. Tobacco use disorder, history of claudication   Plan: -> Patient is s/p reversed, Coumadin  on hold obviously due to subdural hematoma-> management of subdural hematoma per neurosurgery team. Currently patient is at life-threatening bleeding-anticoagulation reversed and held.  Given she has mechanical mitral valve, A-fib, prior history of CVA, she is also at increased risk of mechanical valve thrombosis: However currently she needs to heal from her subdural hematoma, more importantly hemorrhage tracks with 5 mm midline shift. - We need neurosurgery to reassess and let us  know after serial CTs, when is it safe to restart her anticoagulation. -Continue seizure medication, other home medications  Will follow  along   Risk Assessment/Risk Scores: {Complete the following score calculators/questions to meet required metrics.  Press F2         :[id number]}        CHA2DS2-VASc Score = 6  {Confirm score is correct.  If not, click  here to update score.  REFRESH note.  :1} This indicates a 9.7% annual risk of stroke. The patient's score is based upon: CHF History: 1 HTN History: 1 Diabetes History: 0 Stroke History: 2 Vascular Disease History: 0 Age Score: 1 Gender Score: 1   {This patient has a significant risk of stroke if diagnosed with atrial fibrillation.  Please consider VKA or DOAC agent for anticoagulation if the bleeding risk is acceptable.   You can also use the SmartPhrase [email or web address] for documentation.   :[id number]}     For questions or updates, please contact [facility] Please consult [email or web address] for contact info under    Signed, Grayce Bold, MD  07/22/2023 11:02 PM

## 2023-07-22 NOTE — ED Provider Notes (Cosign Needed Addendum)
 [place] [facility] DEPARTMENT AT [facility] Provider Note   CSN: [id number] Arrival date & time: 07/22/23  8158     Patient presents with: No chief complaint on file.   Natasha Davis is a 68 y.o. female headache and dizziness.  Seen here on 6/23 for stroke.  She is on Coumadin  for mechanical valve and A-fib.  Subsequently also started on Lovenox .  Over the last 4 days or so she has had headache however today had significantly worsening headache as well as dizziness.  No vision changes.  She has some left-sided deficits from a prior CVA.  Speech at baseline per friend in room.  Eating and drinking without difficulty.  No recent head trauma or injury since she was discharged.   HPI     Prior to Admission medications   Medication Sig Start Date End Date Taking? Authorizing Provider  acetaminophen  (TYLENOL ) 650 MG CR tablet Take 1,300 mg by mouth daily as needed for pain. Pain    [provider]  atorvastatin  (LIPITOR) 20 MG tablet TAKE 1 TABLET BY MOUTH EVERY DAY Patient taking differently: Take 20 mg by mouth at bedtime. 11/03/22   Nahser, Aleene PARAS, MD  enoxaparin  (LOVENOX ) 100 MG/ML injection Inject 1 mL (100 mg total) into the skin every 12 (twelve) hours. 07/20/23   Danford, Lonni SQUIBB, MD  furosemide  (LASIX ) 40 MG tablet Take 40 mg by mouth daily. Patient not taking: Reported on 07/21/2023 06/06/23   [provider]  levETIRAcetam  (KEPPRA ) 500 MG tablet Take 1 tablet (500 mg total) by mouth 2 (two) times daily. 07/20/23   Danford, Lonni SQUIBB, MD  metoprolol  tartrate (LOPRESSOR ) 50 MG tablet TAKE 1 TABLET BY MOUTH IN THE  MORNING 05/17/23   Nahser, Aleene PARAS, MD  Multiple Vitamins-Minerals (CENTRUM SILVER ADULT 50+) TABS Take 1 tablet by mouth in the morning.    [provider]  omeprazole  (PRILOSEC) 20 MG capsule TAKE 2 CAPSULES BY MOUTH EVERY DAY Patient taking differently: Take 40 mg by mouth daily as needed (reflux). 11/11/22   Nahser,  Aleene PARAS, MD  potassium chloride  (KLOR-CON ) 10 MEQ tablet Take 2 tablets (20 mEq total) by mouth 2 (two) times daily. 02/01/23   Nahser, Aleene PARAS, MD  spironolactone  (ALDACTONE ) 25 MG tablet Take 1 tablet (25 mg total) by mouth daily. 03/28/23   Nahser, Aleene PARAS, MD  torsemide  (DEMADEX ) 20 MG tablet Take 1 tablet (20 mg total) by mouth daily. Patient not taking: Reported on 07/21/2023 03/30/23   Nahser, Aleene PARAS, MD  triamterene -hydrochlorothiazide (MAXZIDE-25) 37.5-25 MG tablet Take 1 tablet by mouth daily. 06/14/23   [provider]  warfarin (COUMADIN ) 5 MG tablet TAKE 1 TABLET BY MOUTH DAILY OR AS DIRECTED BY COUMADIN  CLINIC Patient taking differently: Take 5 mg by mouth daily. 06/02/23   Nahser, Aleene PARAS, MD    Allergies: Penicillins    Review of Systems  Constitutional: Negative.   HENT: Negative.    Respiratory: Negative.    Cardiovascular: Negative.   Gastrointestinal: Negative.   Genitourinary: Negative.   Musculoskeletal: Negative.   Skin: Negative.   Neurological:  Positive for weakness (left) and headaches.  All other systems reviewed and are negative.   Updated Vital Signs BP (!) 172/80   Pulse 81   Temp 99.5 F (37.5 C)   Resp (!) 30   Ht 4' 11 (1.499 m)   Wt 96.2 kg   SpO2 98%   BMI 42.84 kg/m   Physical Exam Vitals and  nursing note reviewed.  Constitutional:      General: She is not in acute distress.    Appearance: She is well-developed. She is not ill-appearing, toxic-appearing or diaphoretic.  HENT:     Head: Normocephalic and atraumatic.     Nose: Nose normal.     Mouth/Throat:     Mouth: Mucous membranes are moist.   Eyes:     Pupils: Pupils are equal, round, and reactive to light.    Cardiovascular:     Rate and Rhythm: Normal rate.     Pulses: Normal pulses.          Radial pulses are 2+ on the right side and 2+ on the left side.       Dorsalis pedis pulses are 2+ on the right side and 2+ on the left side.     Heart sounds: Normal heart  sounds.  Pulmonary:     Effort: Pulmonary effort is normal. No respiratory distress.     Breath sounds: Normal breath sounds.  Abdominal:     General: There is no distension.     Palpations: Abdomen is soft.     Tenderness: There is no abdominal tenderness. There is no right CVA tenderness, left CVA tenderness or guarding.   Musculoskeletal:        General: Normal range of motion.     Cervical back: Normal range of motion.   Skin:    General: Skin is warm and dry.     Coloration: Skin is not jaundiced.   Neurological:     Mental Status: She is alert. Mental status is at baseline.     Sensory: Sensory deficit present.     Motor: Weakness present.     Gait: Gait abnormal.     Comments: Decreased sensation left upper extremity, left lower extremity-chronic per family  Psychiatric:        Mood and Affect: Mood normal.     (all labs ordered are listed, but only abnormal results are displayed) Labs Reviewed  BASIC METABOLIC PANEL WITH GFR - Abnormal; Notable for the following components:      Result Value   Glucose, Bld 135 (*)    All other components within normal limits  CBC WITH DIFFERENTIAL/PLATELET - Abnormal; Notable for the following components:   WBC 10.6 (*)    Neutro Abs 9.2 (*)    All other components within normal limits  PROTIME-INR - Abnormal; Notable for the following components:   Prothrombin Time 24.7 (*)    INR 2.1 (*)    All other components within normal limits  CBC  COMPREHENSIVE METABOLIC PANEL WITH GFR  PROTIME-INR  APTT    EKG: None  Radiology: CT Head Wo Contrast Result Date: 07/22/2023 CLINICAL DATA:  Headache, tension-type recent stroke, on blood thinner EXAM: CT HEAD WITHOUT CONTRAST TECHNIQUE: Contiguous axial images were obtained from the base of the skull through the vertex without intravenous contrast. RADIATION DOSE REDUCTION: This exam was performed according to the departmental dose-optimization program which includes automated  exposure control, adjustment of the mA and/or kV according to patient size and/or use of iterative reconstruction technique. COMPARISON:  CT head July 18, 2023. FINDINGS: Brain: Acute subdural hematomas along bilateral cerebral convexities measuring up to 8 mm in thickness on the right and 10 mm on the left. Acute hemorrhage tracks along the falx and bilateral tentorial leaflets with tentorial hemorrhage measuring 5 mm in thickness on the left and 3 mm on the right. Associated mass effect with  5 mm of rightward midline shift. Remote right MCA territory infarct, unchanged. Small remote left parietal infarct. No evidence of acute large vascular territory infarct or hydrocephalus. Vascular: No hyperdense vessel identified. Skull: No acute fracture. Sinuses/Orbits: Clear sinuses.  No acute orbital findings. IMPRESSION: Acute bilateral subdural hematomas measuring 8 mm on the right and till meters on the left. Hemorrhage tracks along the falx and tentorial leaflets. Associated 5 mm of rightward midline shift. Findings discussed with Dr. Patsey via telephone at 9:31 p.m. Electronically Signed   By: Gilmore GORMAN Molt M.D.   On: 07/22/2023 21:35     .Critical Care  Performed by: Edie Rosebud LABOR, PA-C Authorized by: Edie Rosebud LABOR, PA-C   Critical care provider statement:    Critical care time (minutes):  35   Critical care was necessary to treat or prevent imminent or life-threatening deterioration of the following conditions:  Circulatory failure and CNS failure or compromise   Critical care was time spent personally by me on the following activities:  Development of treatment plan with patient or surrogate, discussions with consultants, evaluation of patient's response to treatment, examination of patient, ordering and review of laboratory studies, ordering and review of radiographic studies, ordering and performing treatments and interventions, pulse oximetry, re-evaluation of patient's condition and  review of old charts    Medications Ordered in the ED  levETIRAcetam  (KEPPRA ) tablet 500 mg (has no administration in time range)  spironolactone  (ALDACTONE ) tablet 25 mg (has no administration in time range)  triamterene -hydrochlorothiazide (MAXZIDE-25) 37.5-25 MG per tablet 1 tablet (has no administration in time range)  atorvastatin  (LIPITOR) tablet 20 mg (has no administration in time range)  furosemide  (LASIX ) tablet 40 mg (has no administration in time range)  pantoprazole  (PROTONIX ) EC tablet 40 mg (has no administration in time range)  sodium chloride  flush (NS) 0.9 % injection 3 mL (has no administration in time range)  hydrALAZINE (APRESOLINE) injection 10 mg (has no administration in time range)  sodium chloride  flush (NS) 0.9 % injection 3 mL (has no administration in time range)  sodium chloride  flush (NS) 0.9 % injection 3 mL (has no administration in time range)  0.9 %  sodium chloride  infusion (has no administration in time range)  acetaminophen  (TYLENOL ) tablet 650 mg (has no administration in time range)    Or  acetaminophen  (TYLENOL ) suppository 650 mg (has no administration in time range)  ondansetron (ZOFRAN) tablet 4 mg (has no administration in time range)    Or  ondansetron (ZOFRAN) injection 4 mg (has no administration in time range)  metoprolol  tartrate (LOPRESSOR ) tablet 25 mg (has no administration in time range)  morphine (PF) 2 MG/ML injection 2 mg (has no administration in time range)  oxyCODONE (Oxy IR/ROXICODONE) immediate release tablet 5 mg (has no administration in time range)  oxyCODONE-acetaminophen  (PERCOCET/ROXICET) 5-325 MG per tablet 1 tablet (1 tablet Oral Given 07/22/23 2232)  acetaminophen  (TYLENOL ) tablet 1,000 mg (1,000 mg Oral Given 07/22/23 2222)    Clinical Course as of 07/22/23 2315  Fri Jul 22, 2023  2226 Discussed with Dr. Lanis, recommend stopping anticoagulation, will need cards consult given her history of needing anticoagulation  due to her mechanical valve.  Repeat head CT 8 to 12 hours [BH]  2242 Dr. Sundil  [BH]    Clinical Course User Index [BH] Loralyn Rachel A, PA-C   68 year old here for evaluation of headache and dizziness.  Recent admission on 6/23 for CVA.  She is chronically anticoagulated on Coumadin  for mechanical valve  and A-fib.  Was found to be subtherapeutic on her INR was subsequently started on Lovenox  bridge in addition to her Coumadin  at discharge.  She has some left-sided deficits at baseline.  No recent falls or injuries.  Will plan on labs and imaging  Labs and imaging personally viewed interpreted CBC leukocytosis 10.6 Metabolic panel without significant abnormality INR 2.1 CT head with acute bilateral subdural with shift  Discussed with Dr. Lanis with neurosurgery.  Recommendation is to stop the anticoagulation however given her mechanical valve, A-fib will need discussion with cardiology tomorrow, risk versus benefit medicine admission, repeat head CT in 8 to 12 hours  Discussed with Dr. Sundil with medicine was agreeable to eval patient for admission  Discussed with patient, friend at bedside as well as daughter via telephone.  Agreeable for admission.  The patient appears reasonably stabilized for admission considering the current resources, flow, and capabilities available in the ED at this time, and I doubt any other [facility] requiring further screening and/or treatment in the ED prior to admission.                                 Medical Decision Making Amount and/or Complexity of Data Reviewed Independent Historian: friend External Data Reviewed: labs, radiology, ECG and notes. Labs: ordered. Decision-making details documented in ED Course. Radiology: ordered and independent interpretation performed. Decision-making details documented in ED Course. ECG/medicine tests: ordered and independent interpretation performed. Decision-making details documented in ED Course.  Risk OTC  drugs. Prescription drug management. Parenteral controlled substances. Decision regarding hospitalization. Diagnosis or treatment significantly limited by social determinants of health.        Final diagnoses:  Bilateral subdural hematomas (HCC)  Chronic anticoagulation  Status post heart valve replacement with mechanical valve    ED Discharge Orders     None        Shantale Holtmeyer A, PA-C 07/22/23 2316    Patsey Lot, MD 07/23/23 0020

## 2023-07-22 NOTE — ED Triage Notes (Signed)
 The pt has had a headache since Monday she was diagnosed with a stroke Monday  dizziness   incrreasedheadache  she is on coumadin 

## 2023-07-22 NOTE — Patient Instructions (Signed)
 Description   Stop Lovenox  injections. Today take 1.5 tablets of Warfarin and then continue taking 1 tablet daily.   Recheck in 1 week (normally 6 weeks).  Prefers Fridays since she doesn't drive. Stay consistent with greens each week (3 times per week)  Call us  # [phone]  Coumadin  Clinic with any medication changes or concerns.

## 2023-07-22 NOTE — Progress Notes (Signed)
Please see anticoagulation encounter.

## 2023-07-22 NOTE — H&P (Incomplete)
 History and Physical    Natasha Davis FMW:[id number] DOB: 07/30/1955 DOA: 07/22/2023  PCP: Marvine Rush, MD   Patient coming from: Home   Chief Complaint: Headache ED TRIAGE note:The pt has had a headache since Monday she was diagnosed with a stroke Monday dizziness incrreasedheadache she is on coumadin    HPI:  Natasha Davis is a 68 y.o. female with medical history significant of seizure disorder, acute CVA 07/20/2023, history of mitral valve replacement on Coumadin , GERD, hyperlipidemia, paroxysmal atrial fibrillation, HFrEF 40 to 45% and morbid obesity presented emergency department complaining of headache.  Patient reported that she has headache since she was diagnosed with a stroke on Monday and continues to have headache now.  Per chart review patient was admitted 6/23 to 6/25 for acute CVA at the same time patient found to have recurrent breakthrough seizure Keppra  has been increased to 500 mg twice daily per neurology.  Patient has history of poor compliance with Keppra  in the past which caused breakthrough seizure during that time.  Also patient found to have concern for subdural hematoma however repeat CTA head ruled out any kind of hematoma.  Further management of acute CVA and neurology recommended to do Coumadin  for the management and do not need any antiplatelet therapy.  Per chart review due to subtherapeutic INR patient being started bridging with IV Lovenox  with Coumadin .  Family at the bedside reported that patient had a mechanical fall few days ago and that got attention to brought to the ED for evaluation eventually found to have patient has acute CVA.  Patient has been doing well since the discharge however complaining about continues to having bandlike frontal headache.  In the ED patient has 1 episode of vomiting.  Patient denies any blurry vision, photosensitivity, and neck pain. Patient denies any other episodes of fall.  Patient denies any dizziness, tingling, tremor,  sensory change, speech difficulty, focal of weakness, generalized weakness, seizure and loss of conscious. Denies any chest pain, palpitation, shortness of breath, vomiting abdominal pain   ED Course:  At presentation to ED patient found to have elevated blood pressure 171/78 otherwise hemodynamically stable. Pro time INR 24.7 and 2.1 within range.  BMP unremarkable. CBC showing leukocytosis 10.6 otherwise unremarkable.  CT head now showing acute bilateral subdural hematoma measuring 8 mm on the right and on the left.  Hemorrhagic tracking along the  along the falx and tentorial leaflets. Associated 5 mm of rightward midline shift.  ED physician consulted neurosurgery Dr. Lanis who stated that the need to hold both Lovenox  and warfarin in the setting of intracranial bleeding and recommended repeat head CT scan in 8 to 10 hours.  Neurosurgery will see patient tomorrow.  Also recommended consult cardiology for anticoagulation management given patient has mechanical mitral valve.  Discussed case with on-call cardiology Dr. Donnel who recommended holding anticoagulation as of now however given patient has mechanical mitral valve at some point if the hematoma remains stable after discussion with neurosurgery need to resume the anticoagulation in the future.  In the ED patient has been given oxycodone and Tylenol  for pain control.  Hospitalist has been consulted for further pressure management of subdural hematoma.  Significant labs in the ED: Lab Orders         Basic metabolic panel         CBC with Differential         Protime-INR         CBC  Comprehensive metabolic panel         Protime-INR         APTT       Review of Systems:  Review of Systems  Constitutional:  Negative for chills, fever, malaise/fatigue and weight loss.  HENT:         Headache  Eyes:  Negative for blurred vision and double vision.  Respiratory:  Negative for cough and shortness of breath.    Cardiovascular:  Negative for chest pain and palpitations.  Gastrointestinal:  Positive for nausea. Negative for abdominal pain and heartburn.  Genitourinary:  Negative for dysuria, frequency, hematuria and urgency.  Musculoskeletal:  Negative for back pain, joint pain, myalgias and neck pain.  Neurological:  Positive for headaches. Negative for dizziness, focal weakness, seizures and weakness.  Psychiatric/Behavioral:  The patient is not nervous/anxious.     Past Medical History:  Diagnosis Date   Chronic anticoagulation    Claudication [facility])    History of CVA (cerebrovascular accident)    Hypertension    Mitral stenosis    Seizure (HCC)    HX    Past Surgical History:  Procedure Laterality Date   CARDIAC CATHETERIZATION  06/01/96   NORMAL LEFT VENTRICULAR SYSTOLIC FUNCTION. EF 60%   CESAREAN SECTION     X2   MITRAL VALVE REPLACEMENT       reports that she quit smoking about 17 years ago. Her smoking use included cigarettes. She has never used smokeless tobacco. She reports that she does not currently use alcohol. She reports that she does not currently use drugs.  Allergies  Allergen Reactions   Penicillins Other (See Comments)    Couldn't walk    Family History  Problem Relation Age of Onset   Hypertension Mother    Heart disease Father    Hypertension Father    Heart attack Father    Stroke Brother     Prior to Admission medications   Medication Sig Start Date End Date Taking? Authorizing Provider  acetaminophen  (TYLENOL ) 650 MG CR tablet Take 1,300 mg by mouth daily as needed for pain. Pain    [provider]  atorvastatin  (LIPITOR) 20 MG tablet TAKE 1 TABLET BY MOUTH EVERY DAY Patient taking differently: Take 20 mg by mouth at bedtime. 11/03/22   Nahser, Aleene PARAS, MD  enoxaparin  (LOVENOX ) 100 MG/ML injection Inject 1 mL (100 mg total) into the skin every 12 (twelve) hours. 07/20/23   Danford, Lonni SQUIBB, MD  furosemide  (LASIX ) 40 MG tablet Take 40  mg by mouth daily. Patient not taking: Reported on 07/21/2023 06/06/23   [provider]  levETIRAcetam  (KEPPRA ) 500 MG tablet Take 1 tablet (500 mg total) by mouth 2 (two) times daily. 07/20/23   Danford, Lonni SQUIBB, MD  metoprolol  tartrate (LOPRESSOR ) 50 MG tablet TAKE 1 TABLET BY MOUTH IN THE  MORNING 05/17/23   Nahser, Aleene PARAS, MD  Multiple Vitamins-Minerals (CENTRUM SILVER ADULT 50+) TABS Take 1 tablet by mouth in the morning.    [provider]  omeprazole  (PRILOSEC) 20 MG capsule TAKE 2 CAPSULES BY MOUTH EVERY DAY Patient taking differently: Take 40 mg by mouth daily as needed (reflux). 11/11/22   Nahser, Aleene PARAS, MD  potassium chloride  (KLOR-CON ) 10 MEQ tablet Take 2 tablets (20 mEq total) by mouth 2 (two) times daily. 02/01/23   Nahser, Aleene PARAS, MD  spironolactone  (ALDACTONE ) 25 MG tablet Take 1 tablet (25 mg total) by mouth daily. 03/28/23   Nahser, Aleene PARAS, MD  torsemide  (DEMADEX ) 20 MG tablet Take 1 tablet (20 mg total) by mouth daily. Patient not taking: Reported on 07/21/2023 03/30/23   Nahser, Aleene PARAS, MD  triamterene -hydrochlorothiazide (MAXZIDE-25) 37.5-25 MG tablet Take 1 tablet by mouth daily. 06/14/23   [provider]  warfarin (COUMADIN ) 5 MG tablet TAKE 1 TABLET BY MOUTH DAILY OR AS DIRECTED BY COUMADIN  CLINIC Patient taking differently: Take 5 mg by mouth daily. 06/02/23   Nahser, Aleene PARAS, MD     Physical Exam: Vitals:   07/23/23 0010 07/23/23 0055 07/23/23 0230 07/23/23 0341  BP: (!) 163/79 (!) 154/81 (!) 152/63 (!) 149/67  Pulse: 83 84 76 87  Resp:   (!) 23   Temp:    98.9 F (37.2 C)  TempSrc:    Oral  SpO2:   95% 98%  Weight:      Height:        Physical Exam Vitals reviewed.  Constitutional:      Appearance: Normal appearance. She is not ill-appearing.  HENT:     Head: Normocephalic and atraumatic.     Comments: Generalized headache    Mouth/Throat:     Mouth: Mucous membranes are moist.   Cardiovascular:     Rate and  Rhythm: Normal rate. Rhythm irregular.     Pulses: Normal pulses.     Heart sounds: Normal heart sounds.  Pulmonary:     Effort: Pulmonary effort is normal.     Breath sounds: Normal breath sounds.  Abdominal:     Palpations: Abdomen is soft.   Musculoskeletal:     Cervical back: Neck supple.     Right lower leg: No edema.     Left lower leg: No edema.   Skin:    General: Skin is warm.     Capillary Refill: Capillary refill takes less than 2 seconds.   Neurological:     Mental Status: She is oriented to person, place, and time.     Cranial Nerves: No cranial nerve deficit.     Sensory: No sensory deficit.     Motor: No weakness.     Coordination: Coordination normal.   Psychiatric:        Mood and Affect: Mood normal.        Thought Content: Thought content normal.      Labs on Admission: I have personally reviewed following labs and imaging studies  CBC: Recent Labs  Lab 07/18/23 1047 07/18/23 1215 07/19/23 0443 07/20/23 0433 07/22/23 2014  WBC 8.0  --  7.3 5.5 10.6*  NEUTROABS 6.0  --   --   --  9.2*  HGB 12.1 13.3 10.5* 10.3* 12.5  HCT 38.4 39.0 32.9* 32.7* 40.4  MCV 97.5  --  96.5 97.0 99.5  PLT 228  --  205 201 225   Basic Metabolic Panel: Recent Labs  Lab 07/18/23 1047 07/18/23 1215 07/19/23 0443 07/22/23 2014  NA 137 138 135 135  K 3.7 3.6 3.8 5.0  CL 99 101 104 99  CO2 21*  --  22 25  GLUCOSE 171* 164* 138* 135*  BUN 17 19 12 11   CREATININE 1.14* 1.10* 0.97 0.93  CALCIUM  9.3  --  8.4* 10.0   GFR: Estimated Creatinine Clearance: 58 mL/min (by C-G formula based on SCr of 0.93 mg/dL). Liver Function Tests: Recent Labs  Lab 07/18/23 1047 07/19/23 0443  AST 41 35  ALT 25 21  ALKPHOS 102 85  BILITOT 1.0 1.1  PROT 7.7 6.1*  ALBUMIN 4.2 3.5  No results for input(s): LIPASE, AMYLASE in the last 168 hours. No results for input(s): AMMONIA in the last 168 hours. Coagulation Profile: Recent Labs  Lab 07/18/23 1047 07/19/23 0443  07/20/23 0433 07/22/23 1201 07/22/23 2014  INR 1.9* 1.8* 2.1* 2.8 2.1*   Cardiac Enzymes: No results for input(s): CKTOTAL, CKMB, CKMBINDEX, TROPONINI, TROPONINIHS in the last 168 hours. BNP (last 3 results) Recent Labs    01/02/23 1655  BNP 207.9*   HbA1C: No results for input(s): HGBA1C in the last 72 hours. CBG: No results for input(s): GLUCAP in the last 168 hours. Lipid Profile: No results for input(s): CHOL, HDL, LDLCALC, TRIG, CHOLHDL, LDLDIRECT in the last 72 hours. Thyroid  Function Tests: No results for input(s): TSH, T4TOTAL, FREET4, T3FREE, THYROIDAB in the last 72 hours. Anemia Panel: No results for input(s): VITAMINB12, FOLATE, FERRITIN, TIBC, IRON, RETICCTPCT in the last 72 hours. Urine analysis:    Component Value Date/Time   COLORURINE YELLOW 01/02/2023 1812   APPEARANCEUR CLEAR 01/02/2023 1812   LABSPEC 1.009 01/02/2023 1812   PHURINE 6.5 01/02/2023 1812   GLUCOSEU NEGATIVE 01/02/2023 1812   HGBUR NEGATIVE 01/02/2023 1812   BILIRUBINUR NEGATIVE 01/02/2023 1812   KETONESUR NEGATIVE 01/02/2023 1812   PROTEINUR 30 (A) 01/02/2023 1812   UROBILINOGEN 0.2 09/04/2010 1617   NITRITE NEGATIVE 01/02/2023 1812   LEUKOCYTESUR MODERATE (A) 01/02/2023 1812    Radiological Exams on Admission: I have personally reviewed images CT Head Wo Contrast Result Date: 07/23/2023 CLINICAL DATA:  68 year old female with bilateral subdural hematoma. EXAM: CT HEAD WITHOUT CONTRAST TECHNIQUE: Contiguous axial images were obtained from the base of the skull through the vertex without intravenous contrast. RADIATION DOSE REDUCTION: This exam was performed according to the departmental dose-optimization program which includes automated exposure control, adjustment of the mA and/or kV according to patient size and/or use of iterative reconstruction technique. COMPARISON:  Head CT yesterday and earlier. FINDINGS: Brain: Mostly hyperdense  bilateral subdural hematoma. Left-side hemorrhage thickness which is generally 6 mm (although approaching 10 mm at the left operculum on coronal image 45), stable. Lobulated right side subdural hematoma superimposed on chronic right hemisphere encephalomalacia is generally 6-8 mm in thickness and stable. Blood layering along the bilateral tentorium and falx again noted. Stable intracranial mass effect with rightward midline shift of 4 mm and partially effaced left lateral ventricle (coronal image 44 and series 3 image 17. No ventriculomegaly. No new intracranial hemorrhage identified. Basilar cisterns remain patent. Stable gray-white matter differentiation throughout the brain. Chronic right MCA territory cystic encephalomalacia. Small chronic left thalamic lacunar infarct. Vascular: Stable.  Calcified atherosclerosis at the skull base. Skull: Stable and intact. Sinuses/Orbits: Visualized paranasal sinuses and mastoids are stable and well aerated. Other: Orbit and scalp soft tissues appears stable and negative. IMPRESSION: 1. Stable bilateral Subdural Hematomas, generally hyperdense and 6-8 mm thickness bilaterally. 2. Stable mild intracranial mass effect with 4 mm of rightward midline shift. 3. No new intracranial abnormality. Chronic right MCA territory infarct. Electronically Signed   By: VEAR Hurst M.D.   On: 07/23/2023 05:20   CT Head Wo Contrast Result Date: 07/22/2023 CLINICAL DATA:  Headache, tension-type recent stroke, on blood thinner EXAM: CT HEAD WITHOUT CONTRAST TECHNIQUE: Contiguous axial images were obtained from the base of the skull through the vertex without intravenous contrast. RADIATION DOSE REDUCTION: This exam was performed according to the departmental dose-optimization program which includes automated exposure control, adjustment of the mA and/or kV according to patient size and/or use of iterative reconstruction technique. COMPARISON:  CT head July 18, 2023. FINDINGS: Brain: Acute subdural  hematomas along bilateral cerebral convexities measuring up to 8 mm in thickness on the right and 10 mm on the left. Acute hemorrhage tracks along the falx and bilateral tentorial leaflets with tentorial hemorrhage measuring 5 mm in thickness on the left and 3 mm on the right. Associated mass effect with 5 mm of rightward midline shift. Remote right MCA territory infarct, unchanged. Small remote left parietal infarct. No evidence of acute large vascular territory infarct or hydrocephalus. Vascular: No hyperdense vessel identified. Skull: No acute fracture. Sinuses/Orbits: Clear sinuses.  No acute orbital findings. IMPRESSION: Acute bilateral subdural hematomas measuring 8 mm on the right and till meters on the left. Hemorrhage tracks along the falx and tentorial leaflets. Associated 5 mm of rightward midline shift. Findings discussed with Dr. Patsey via telephone at 9:31 p.m. Electronically Signed   By: Gilmore GORMAN Molt M.D.   On: 07/22/2023 21:35     EKG: EKG showed rate controlled heart rate 83 and prolonged QTc 54 milliseconds.    Assessment/Plan: Principal Problem:   Subdural hematoma (HCC) Active Problems:   H/O mitral valve replacement with mechanical valve   Intractable headache   Essential hypertension   Seizure disorder (HCC)   History of CVA (cerebrovascular accident)   Paroxysmal atrial fibrillation (HCC)   Combined systolic and diastolic congestive heart failure (HCC)    Assessment and Plan: Subdural hematoma History of subdural hematoma 07/18/2023 -Presented to emergency department intractable headache.  Patient has recent acute CVA and subdural hematoma 07/18/2023 found on head CT and repeat MRI 6/23 showed possible thin subdural hematoma. -CT head today showing acute bilateral subdural hematoma measuring up to 8 mm on the right and on the left.  Hemorrhagic tractalong the falx and tentorial leaflets. Associated 5 mm of rightward midline shift. -Given patient has  subtherapeutic INR few days ago being started on IV Lovenox  with warfarin as repeat MRI showed improvement of the subdural hematoma. - Today found to have pro time 24 and INR 2.1 within normal range.  CBC and CMP unremarkable - Neurosurgery Dr. Lanis has been consulted by ED physician who recommended repeat head CT scan in 8 to 10 hours to follow-up with the subdural hematoma, hold the anticoagulant and reach out to cardiology for further recommendation regarding anticoagulation management - Discussed case with cardiology Dr. Donnel who stated that definitely need to hold the anticoagulation as of now and need to follow-up with repeat head CT scan and neurosurgery again to discuss further anticoagulation management.  Given patient has mechanical mitral valve she will need some kind of anticoagulation in the long run. -Holding all kind of anticoagulation and antiplatelet therapy. -Continue neurocheck every 4 hours - Have to have a good blood pressure control below 120/80 in order to prevent further intracranial bleeding. Addendum - Repeat head CT scan 8 hours apart at 5 AM showing stable bilateral subdural hematoma generally hyperdense, 6 to 8 mm thickness bilaterally.  Stable mild intracranial mass effect 4 mm of rightward midline shift.  No new intracranial abnormality.  Chronic right MCA territory infarction. -Given head CT scan showing stable subdural hematoma holding to inform neurosurgery again at 6 AM.  Patient will be evaluated by neurosurgery after 7 AM. - Continue hold IV Lovenox  and warfarin.  Continue to hold any kind of antiplatelet and anticoagulation.   History of recent acute CVA 07/18/2023 -Recent episode of acute CVA 07/18/2023 in the setting of subtherapeutic INR in the context of mechanical  mitral valve.  Patient being started on IV Lovenox  bridging with warfarin however unfortunately now has acute subdural hematoma. INR 2.1 pro time 24.   Intractable headache secondary to  underlying subdural hematoma -Intractable headache in the setting of underlying hematoma.  Continue pain management with Tylenol  as needed.  Need to avoid narcotics  History of seizure disorder In the setting of nausea continue IV Keppra   500 mg twice daily.  Paroxysmal atrial fibrillation Prolonged QTc interval - EKG showed A-fib rate controlled 83 and prolonged QT interval 554 ms. - At home patient is on Lopressor  50 mg daily.  Changing to  Lopressor  25 mg twice daily. -Continue to monitor QTc interval and avoid further QT prolongation medications.   History of mitral stenosis s/p mechanical mitral valve placement Pro time INR 24 and 2.1.  Hold Lovenox  and warfarin.  Combined systolic and diastolic heart failure EF 40 to 45% -Elevated blood pressure 172/80. - Restarting home blood pressure regimen Lasix  40 mg daily, Lopressor  25 mg twice daily, Maxide once daily (triamterene -hydrochlorothiazide).  Holding spironolactone  given patient is already on potassium sparing diuretics triamterene .   Hyperlipidemia - Continue Lipitor  DVT prophylaxis:  SCDs Code Status:  Full Code Diet: Heart healthy diet Family Communication:   Family was present at bedside, at the time of interview. Opportunity was given to ask question and all questions were answered satisfactorily.  Disposition Plan: Need to follow-up with repeat head CT scan result. Consults: Neurosurgery and cardiology Admission status:   Inpatient, Step Down Unit  Severity of Illness: The appropriate patient status for this patient is INPATIENT. Inpatient status is judged to be reasonable and necessary in order to provide the required intensity of service to ensure the patient's safety. The patient's presenting symptoms, physical exam findings, and initial radiographic and laboratory data in the context of their chronic comorbidities is felt to place them at high risk for further clinical deterioration. Furthermore, it is not  anticipated that the patient will be medically stable for discharge from the hospital within 2 midnights of admission.   * I certify that at the point of admission it is my clinical judgment that the patient will require inpatient hospital care spanning beyond 2 midnights from the point of admission due to high intensity of service, high risk for further deterioration and high frequency of surveillance required.DEWAINE    Natalia Wittmeyer, MD [facility]  How to contact the [facility] Attending or Consulting provider 7A - 7P or covering provider during after hours 7P -7A, for this patient.  Check the care team in [facility] and look for a) attending/consulting [facility] provider listed and b) the [facility] team listed Log into [email or web address] and use [facility]'s universal password to access. If you do not have the password, please contact the hospital operator. Locate the [facility] provider you are looking for under [facility] and page to a number that you can be directly reached. If you still have difficulty reaching the provider, please page the [facility] (Director on Call) for the Hospitalists listed on amion for assistance.  07/23/2023, 6:00 AM

## 2023-07-23 ENCOUNTER — Inpatient Hospital Stay (HOSPITAL_COMMUNITY)

## 2023-07-23 DIAGNOSIS — I5042 Chronic combined systolic (congestive) and diastolic (congestive) heart failure: Secondary | ICD-10-CM | POA: Diagnosis not present

## 2023-07-23 DIAGNOSIS — I69354 Hemiplegia and hemiparesis following cerebral infarction affecting left non-dominant side: Secondary | ICD-10-CM | POA: Diagnosis not present

## 2023-07-23 DIAGNOSIS — Z952 Presence of prosthetic heart valve: Secondary | ICD-10-CM | POA: Diagnosis not present

## 2023-07-23 DIAGNOSIS — S065XAA Traumatic subdural hemorrhage with loss of consciousness status unknown, initial encounter: Secondary | ICD-10-CM | POA: Diagnosis not present

## 2023-07-23 DIAGNOSIS — I4819 Other persistent atrial fibrillation: Secondary | ICD-10-CM | POA: Diagnosis not present

## 2023-07-23 DIAGNOSIS — I6200 Nontraumatic subdural hemorrhage, unspecified: Secondary | ICD-10-CM | POA: Diagnosis not present

## 2023-07-23 DIAGNOSIS — R569 Unspecified convulsions: Secondary | ICD-10-CM | POA: Diagnosis not present

## 2023-07-23 DIAGNOSIS — I6201 Nontraumatic acute subdural hemorrhage: Secondary | ICD-10-CM

## 2023-07-23 LAB — COMPREHENSIVE METABOLIC PANEL WITH GFR
ALT: 52 U/L — ABNORMAL HIGH (ref 0–44)
AST: 83 U/L — ABNORMAL HIGH (ref 15–41)
Albumin: 4.1 g/dL (ref 3.5–5.0)
Alkaline Phosphatase: 97 U/L (ref 38–126)
Anion gap: 16 — ABNORMAL HIGH (ref 5–15)
BUN: 12 mg/dL (ref 8–23)
CO2: 23 mmol/L (ref 22–32)
Calcium: 9.7 mg/dL (ref 8.9–10.3)
Chloride: 99 mmol/L (ref 98–111)
Creatinine, Ser: 0.86 mg/dL (ref 0.44–1.00)
GFR, Estimated: >60 mL/min (ref >60)
Glucose, Bld: 128 mg/dL — ABNORMAL HIGH (ref 70–99)
Potassium: 4.7 mmol/L (ref 3.5–5.1)
Sodium: 138 mmol/L (ref 135–145)
Total Bilirubin: 1.2 mg/dL (ref 0.0–1.2)
Total Protein: 7.7 g/dL (ref 6.5–8.1)

## 2023-07-23 LAB — CBC
HCT: 36.7 % (ref 36.0–46.0)
Hemoglobin: 11.4 g/dL — ABNORMAL LOW (ref 12.0–15.0)
MCH: 30.6 pg (ref 26.0–34.0)
MCHC: 31.1 g/dL (ref 30.0–36.0)
MCV: 98.7 fL (ref 80.0–100.0)
Platelets: 236 10*3/uL (ref 150–400)
RBC: 3.72 MIL/uL — ABNORMAL LOW (ref 3.87–5.11)
RDW: 13.3 % (ref 11.5–15.5)
WBC: 13.8 10*3/uL — ABNORMAL HIGH (ref 4.0–10.5)
nRBC: 0 % (ref 0.0–0.2)

## 2023-07-23 LAB — PROTIME-INR
INR: 1.8 — ABNORMAL HIGH (ref 0.8–1.2)
Prothrombin Time: 21.7 s — ABNORMAL HIGH (ref 11.4–15.2)

## 2023-07-23 LAB — CBG MONITORING, ED: Glucose-Capillary: 128 mg/dL — ABNORMAL HIGH (ref 70–99)

## 2023-07-23 LAB — APTT: aPTT: 37 s — ABNORMAL HIGH (ref 24–36)

## 2023-07-23 MED ORDER — TRIAMTERENE-HCTZ 37.5-25 MG PO TABS
1.0000 | ORAL_TABLET | Freq: Every day | ORAL | Status: DC
  Administered 2023-07-23: 1 via ORAL
  Filled 2023-07-23 (×2): qty 1

## 2023-07-23 MED ORDER — SPIRONOLACTONE 12.5 MG HALF TABLET: 12.5000 mg | ORAL_TABLET | Freq: Every day | ORAL | Status: DC

## 2023-07-23 MED ORDER — METOCLOPRAMIDE HCL 5 MG/ML IJ SOLN
10.0000 mg | Freq: Four times a day (QID) | INTRAMUSCULAR | Status: DC | PRN
  Administered 2023-07-23: 10 mg via INTRAVENOUS
  Filled 2023-07-23: qty 2

## 2023-07-23 MED ORDER — TRIMETHOBENZAMIDE HCL 100 MG/ML IM SOLN
200.0000 mg | Freq: Three times a day (TID) | INTRAMUSCULAR | Status: DC | PRN
  Administered 2023-07-23: 200 mg via INTRAMUSCULAR
  Filled 2023-07-23: qty 2

## 2023-07-23 MED ORDER — LEVETIRACETAM (KEPPRA) 500 MG/5 ML ADULT IV PUSH
500.0000 mg | Freq: Two times a day (BID) | INTRAVENOUS | Status: DC
  Administered 2023-07-23 (×2): 500 mg via INTRAVENOUS
  Filled 2023-07-23 (×3): qty 5

## 2023-07-23 MED ORDER — SPIRONOLACTONE 12.5 MG HALF TABLET: 25.0000 mg | ORAL_TABLET | Freq: Every day | ORAL | Status: DC

## 2023-07-23 MED ORDER — FLUCONAZOLE 150 MG PO TABS
150.0000 mg | ORAL_TABLET | Freq: Once | ORAL | Status: DC
  Filled 2023-07-23: qty 1

## 2023-07-23 MED ORDER — ONDANSETRON HCL 4 MG/2ML IJ SOLN
4.0000 mg | Freq: Four times a day (QID) | INTRAMUSCULAR | Status: DC | PRN
  Filled 2023-07-23: qty 2

## 2023-07-23 MED ORDER — LEVETIRACETAM (KEPPRA) 500 MG/5 ML ADULT IV PUSH
3000.0000 mg | Freq: Once | INTRAVENOUS | Status: AC
  Administered 2023-07-23: 3000 mg via INTRAVENOUS
  Filled 2023-07-23: qty 30

## 2023-07-23 MED ORDER — LEVETIRACETAM (KEPPRA) 500 MG/5 ML ADULT IV PUSH
250.0000 mg | Freq: Once | INTRAVENOUS | Status: AC
  Administered 2023-07-23: 250 mg via INTRAVENOUS
  Filled 2023-07-23: qty 5

## 2023-07-23 MED ORDER — LEVETIRACETAM (KEPPRA) 500 MG/5 ML ADULT IV PUSH
750.0000 mg | Freq: Two times a day (BID) | INTRAVENOUS | Status: DC
  Administered 2023-07-23 – 2023-07-28 (×10): 750 mg via INTRAVENOUS
  Filled 2023-07-23 (×10): qty 10

## 2023-07-23 MED ORDER — METOPROLOL TARTRATE 25 MG PO TABS
25.0000 mg | ORAL_TABLET | Freq: Two times a day (BID) | ORAL | Status: DC
  Administered 2023-07-23 (×3): 25 mg via ORAL
  Filled 2023-07-23 (×4): qty 1

## 2023-07-23 MED ORDER — LORAZEPAM 2 MG/ML IJ SOLN
2.0000 mg | INTRAMUSCULAR | Status: DC | PRN
  Administered 2023-07-23: 2 mg via INTRAVENOUS
  Filled 2023-07-23 (×2): qty 1

## 2023-07-23 NOTE — Progress Notes (Signed)
 LTM EEG hooked up and running - no initial skin breakdown - push button tested - [facility] monitoring.

## 2023-07-23 NOTE — ED Notes (Addendum)
 Pt threw up a large amount after receiving nightly meds, this RN confirms spotting 3 pills in vomit. pt states that she feels significantly much better now, and chest discomfort is resolved as well as head pain has decreased. MD notified of situation Gown and sheet changed.

## 2023-07-23 NOTE — Progress Notes (Signed)
 PROGRESS NOTE    Natasha Davis  FMW:[id number] DOB: 1955/12/09 DOA: 07/22/2023 PCP: Marvine Rush, MD   Brief Narrative:  68 y.o. female with medical history significant of seizure disorder, acute CVA (required admission from 07/18/2023-07/20/2023 for breakthrough seizure and acute CVA requiring increase in dose of Keppra  by neurology), history of mitral valve replacement on Coumadin , GERD, hyperlipidemia, paroxysmal atrial fibrillation, HFrEF 40 to 45% and morbid obesity presented with worsening headache.  On presentation, CT head showed acute bilateral subdural hematoma with some shift.  Neurosurgery/Dr. Lanis was consulted: Recommended to hold Lovenox  and warfarin and recommended to repeat CT head in 8 to 10 hours.  Cardiology was also consulted for recommendations regarding anticoagulation given the fact that patient has a history of mechanical mitral valve.  Assessment & Plan:   Subdural hematoma Hyperlipidemia Intractable headache History of recent acute incidental CVA: In the setting of subtherapeutic INR and the patient with mechanical mitral valve - Imaging as above.  CT head repeated early this morning as per neurosurgery recommendations showed stable bilateral subdural hematomas with some mass effect and midline shift.  Awaiting neurosurgery evaluation.  Lovenox  and Coumadin  on hold - Monitor mental status.  Fall precautions. - Continue statin.  Outpatient follow-up with neurology  History of seizure disorder -continue IV Keppra  for now.  Outpatient follow-up with neurology.  Persistent A-fib Prolonged QT interval - Currently rate controlled.  Continue metoprolol .  Anticoagulation on hold as above.  Telemetry monitoring.  Avoid QT prolonging medications  History of mechanical mitral valve replacement for mitral stenosis -Anticoagulation to remain on hold till neurosurgery clearance to resume it.  Cardiology following.  Chronic systolic heart failure Hypertension -  Currently compensated.  Continue Lasix , metoprolol , triamterene -hydrochlorothiazide  Morbid obesity - Outpatient follow-up  DVT prophylaxis: SCDs Code Status: Full Family Communication: Son and friend at bedside Disposition Plan: Status is: Inpatient Remains inpatient appropriate because: Of severity of illness    Consultants: Neurosurgery/cardiology  Procedures: None  Antimicrobials: None   Subjective: Patient seen and examined at bedside.  Slow to respond, poor historian.  No seizures, fever reported.  Had an episode of vomiting overnight.  States that headache is improving.  Family at bedside reports intermittent agitation earlier.  Objective: Vitals:   07/23/23 0055 07/23/23 0230 07/23/23 0341 07/23/23 0445  BP: (!) 154/81 (!) 152/63 (!) 149/67 (!) 143/76  Pulse: 84 76 87 75  Resp:  (!) 23  (!) 24  Temp:   98.9 F (37.2 C)   TempSrc:   Oral   SpO2:  95% 98% 96%  Weight:      Height:       No intake or output data in the 24 hours ending 07/23/23 0817 Filed Weights   07/22/23 1935  Weight: 96.2 kg    Examination:  General exam: Appears calm and comfortable.  Looks chronically ill and deconditioned. Respiratory system: Bilateral decreased breath sounds at bases with scattered crackles Cardiovascular system: S1 & S2 heard, Rate controlled Gastrointestinal system: Abdomen is morbidly obese, nondistended, soft and nontender. Normal bowel sounds heard. Extremities: No cyanosis, clubbing, mild lower extremity edema  Central nervous system: Slow to respond, but answers questions appropriately.  Moving extremities Skin: No rashes, lesions or ulcers Psychiatry: Flat affect.  Not agitated.     Data Reviewed: I have personally reviewed following labs and imaging studies  CBC: Recent Labs  Lab 07/18/23 1047 07/18/23 1215 07/19/23 0443 07/20/23 0433 07/22/23 2014 07/23/23 0427  WBC 8.0  --  7.3 5.5 10.6*  13.8*  NEUTROABS 6.0  --   --   --  9.2*  --   HGB 12.1  13.3 10.5* 10.3* 12.5 11.4*  HCT 38.4 39.0 32.9* 32.7* 40.4 36.7  MCV 97.5  --  96.5 97.0 99.5 98.7  PLT 228  --  205 201 225 236   Basic Metabolic Panel: Recent Labs  Lab 07/18/23 1047 07/18/23 1215 07/19/23 0443 07/22/23 2014  NA 137 138 135 135  K 3.7 3.6 3.8 5.0  CL 99 101 104 99  CO2 21*  --  22 25  GLUCOSE 171* 164* 138* 135*  BUN 17 19 12 11   CREATININE 1.14* 1.10* 0.97 0.93  CALCIUM  9.3  --  8.4* 10.0   GFR: Estimated Creatinine Clearance: 58 mL/min (by C-G formula based on SCr of 0.93 mg/dL). Liver Function Tests: Recent Labs  Lab 07/18/23 1047 07/19/23 0443  AST 41 35  ALT 25 21  ALKPHOS 102 85  BILITOT 1.0 1.1  PROT 7.7 6.1*  ALBUMIN 4.2 3.5   No results for input(s): LIPASE, AMYLASE in the last 168 hours. No results for input(s): AMMONIA in the last 168 hours. Coagulation Profile: Recent Labs  Lab 07/19/23 0443 07/20/23 0433 07/22/23 1201 07/22/23 2014 07/23/23 0427  INR 1.8* 2.1* 2.8 2.1* 1.8*   Cardiac Enzymes: No results for input(s): CKTOTAL, CKMB, CKMBINDEX, TROPONINI in the last 168 hours. BNP (last 3 results) No results for input(s): PROBNP in the last 8760 hours. HbA1C: No results for input(s): HGBA1C in the last 72 hours. CBG: No results for input(s): GLUCAP in the last 168 hours. Lipid Profile: No results for input(s): CHOL, HDL, LDLCALC, TRIG, CHOLHDL, LDLDIRECT in the last 72 hours. Thyroid  Function Tests: No results for input(s): TSH, T4TOTAL, FREET4, T3FREE, THYROIDAB in the last 72 hours. Anemia Panel: No results for input(s): VITAMINB12, FOLATE, FERRITIN, TIBC, IRON, RETICCTPCT in the last 72 hours. Sepsis Labs: No results for input(s): PROCALCITON, LATICACIDVEN in the last 168 hours.  No results found for this or any previous visit (from the past 240 hours).       Radiology Studies: CT Head Wo Contrast Result Date: 07/23/2023 CLINICAL DATA:  68 year old female  with bilateral subdural hematoma. EXAM: CT HEAD WITHOUT CONTRAST TECHNIQUE: Contiguous axial images were obtained from the base of the skull through the vertex without intravenous contrast. RADIATION DOSE REDUCTION: This exam was performed according to the departmental dose-optimization program which includes automated exposure control, adjustment of the mA and/or kV according to patient size and/or use of iterative reconstruction technique. COMPARISON:  Head CT yesterday and earlier. FINDINGS: Brain: Mostly hyperdense bilateral subdural hematoma. Left-side hemorrhage thickness which is generally 6 mm (although approaching 10 mm at the left operculum on coronal image 45), stable. Lobulated right side subdural hematoma superimposed on chronic right hemisphere encephalomalacia is generally 6-8 mm in thickness and stable. Blood layering along the bilateral tentorium and falx again noted. Stable intracranial mass effect with rightward midline shift of 4 mm and partially effaced left lateral ventricle (coronal image 44 and series 3 image 17. No ventriculomegaly. No new intracranial hemorrhage identified. Basilar cisterns remain patent. Stable gray-white matter differentiation throughout the brain. Chronic right MCA territory cystic encephalomalacia. Small chronic left thalamic lacunar infarct. Vascular: Stable.  Calcified atherosclerosis at the skull base. Skull: Stable and intact. Sinuses/Orbits: Visualized paranasal sinuses and mastoids are stable and well aerated. Other: Orbit and scalp soft tissues appears stable and negative. IMPRESSION: 1. Stable bilateral Subdural Hematomas, generally hyperdense and 6-8 mm thickness  bilaterally. 2. Stable mild intracranial mass effect with 4 mm of rightward midline shift. 3. No new intracranial abnormality. Chronic right MCA territory infarct. Electronically Signed   By: VEAR Hurst M.D.   On: 07/23/2023 05:20   CT Head Wo Contrast Result Date: 07/22/2023 CLINICAL DATA:  Headache,  tension-type recent stroke, on blood thinner EXAM: CT HEAD WITHOUT CONTRAST TECHNIQUE: Contiguous axial images were obtained from the base of the skull through the vertex without intravenous contrast. RADIATION DOSE REDUCTION: This exam was performed according to the departmental dose-optimization program which includes automated exposure control, adjustment of the mA and/or kV according to patient size and/or use of iterative reconstruction technique. COMPARISON:  CT head July 18, 2023. FINDINGS: Brain: Acute subdural hematomas along bilateral cerebral convexities measuring up to 8 mm in thickness on the right and 10 mm on the left. Acute hemorrhage tracks along the falx and bilateral tentorial leaflets with tentorial hemorrhage measuring 5 mm in thickness on the left and 3 mm on the right. Associated mass effect with 5 mm of rightward midline shift. Remote right MCA territory infarct, unchanged. Small remote left parietal infarct. No evidence of acute large vascular territory infarct or hydrocephalus. Vascular: No hyperdense vessel identified. Skull: No acute fracture. Sinuses/Orbits: Clear sinuses.  No acute orbital findings. IMPRESSION: Acute bilateral subdural hematomas measuring 8 mm on the right and till meters on the left. Hemorrhage tracks along the falx and tentorial leaflets. Associated 5 mm of rightward midline shift. Findings discussed with Dr. Patsey via telephone at 9:31 p.m. Electronically Signed   By: Gilmore GORMAN Molt M.D.   On: 07/22/2023 21:35        Scheduled Meds:  atorvastatin   20 mg Oral QHS   furosemide   40 mg Oral Daily   levETIRAcetam   500 mg Intravenous BID   metoprolol  tartrate  25 mg Oral BID   pantoprazole   40 mg Oral Daily   sodium chloride  flush  3 mL Intravenous Q12H   sodium chloride  flush  3 mL Intravenous Q12H   triamterene -hydrochlorothiazide  1 tablet Oral Daily   Continuous Infusions:  sodium chloride             Sophie Mao, MD [facility] 07/23/2023, 8:17 AM

## 2023-07-23 NOTE — ED Notes (Signed)
 Assisted pt with bedside commode. Pt back on ED stretcher locked in lowest position with call bell in reach. Family at bedside.

## 2023-07-23 NOTE — Consult Note (Signed)
 Chief Complaint   Headache  History of Present Illness  Natasha Davis is a 68 y.o. female presenting to the emergency department with about 3 days of progressively worsening headache.  She was recently admitted to the hospital, discharged about 6 days ago with breakthrough seizure at which time imaging also revealed a largely asymptomatic left frontal cortical infarct.  Of note, the patient is chronically anticoagulated on both Coumadin  and Lovenox  for a previous mechanical mitral valve.  She also has atrial fibrillation.  She began experiencing headache in the hospital which was relatively mild, she says it got worse about 2 days after discharge with progressive worsening ultimately prompting return to the emergency department where CT scan revealed bilateral acute subdural hematomas.  Neurosurgical consultation was therefore requested.  In the ED her Coumadin  was reversed.  Of note, the patient does have medical history as above including hypertension, vascular disease, seizure disorder, mitral stenosis status post mechanical mitral valve replacement.  She does also report a more remote right sided stroke with minimal residual left-sided weakness.  No known lung, liver, kidney disease.  No cancer history.  She quit smoking 9 months ago.  Past Medical History   Past Medical History:  Diagnosis Date   Chronic anticoagulation    Claudication [facility])    History of CVA (cerebrovascular accident)    Hypertension    Mitral stenosis    Seizure (HCC)    HX    Past Surgical History   Past Surgical History:  Procedure Laterality Date   CARDIAC CATHETERIZATION  06/01/96   NORMAL LEFT VENTRICULAR SYSTOLIC FUNCTION. EF 60%   CESAREAN SECTION     X2   MITRAL VALVE REPLACEMENT      Social History   Social History   Tobacco Use   Smoking status: Former    Current packs/day: 0.00    Types: Cigarettes    Quit date: 01/25/2006    Years since quitting: 17.5   Smokeless tobacco: Never   Substance Use Topics   Alcohol use: Not Currently   Drug use: Not Currently    Medications   Prior to Admission medications   Medication Sig Start Date End Date Taking? Authorizing Provider  acetaminophen  (TYLENOL ) 650 MG CR tablet Take 1,300 mg by mouth daily as needed for pain. Pain   Yes [provider]  atorvastatin  (LIPITOR) 20 MG tablet TAKE 1 TABLET BY MOUTH EVERY DAY Patient taking differently: Take 20 mg by mouth at bedtime. 11/03/22  Yes Nahser, Aleene PARAS, MD  enoxaparin  (LOVENOX ) 100 MG/ML injection Inject 1 mL (100 mg total) into the skin every 12 (twelve) hours. 07/20/23  Yes Danford, Lonni SQUIBB, MD  levETIRAcetam  (KEPPRA ) 500 MG tablet Take 1 tablet (500 mg total) by mouth 2 (two) times daily. 07/20/23  Yes Danford, Lonni SQUIBB, MD  metoprolol  tartrate (LOPRESSOR ) 50 MG tablet TAKE 1 TABLET BY MOUTH IN THE  MORNING 05/17/23  Yes Nahser, Aleene PARAS, MD  Multiple Vitamins-Minerals (CENTRUM SILVER ADULT 50+) TABS Take 1 tablet by mouth in the morning.   Yes [provider]  omeprazole  (PRILOSEC) 20 MG capsule TAKE 2 CAPSULES BY MOUTH EVERY DAY Patient taking differently: Take 40 mg by mouth daily as needed (reflux). 11/11/22  Yes Nahser, Aleene PARAS, MD  potassium chloride  (KLOR-CON ) 10 MEQ tablet Take 2 tablets (20 mEq total) by mouth 2 (two) times daily. 02/01/23  Yes Nahser, Aleene PARAS, MD  spironolactone  (ALDACTONE ) 25 MG tablet Take 1 tablet (25 mg total) by mouth daily.  03/28/23  Yes Nahser, Aleene PARAS, MD  triamterene -hydrochlorothiazide (MAXZIDE-25) 37.5-25 MG tablet Take 1 tablet by mouth daily. 06/14/23  Yes [provider]  warfarin (COUMADIN ) 5 MG tablet TAKE 1 TABLET BY MOUTH DAILY OR AS DIRECTED BY COUMADIN  CLINIC Patient taking differently: Take 5 mg by mouth daily after supper. TAKE 1 TABLET BY MOUTH DAILY OR AS DIRECTED BY COUMADIN  CLINIC 06/02/23  Yes Nahser, Aleene PARAS, MD    Allergies   Allergies  Allergen Reactions   Penicillins Other (See  Comments)    Couldn't walk    Review of Systems  ROS  Neurologic Exam  Awake, alert, oriented Memory and concentration grossly intact Speech fluent, appropriate CN grossly intact Motor exam: Upper Extremities Deltoid Bicep Tricep Grip  Right 5/5 5/5 5/5 5/5  Left 5/5 5/5 5/5 5/5   Lower Extremities IP Quad PF DF EHL  Right 5/5 5/5 5/5 5/5 5/5  Left 5/5 5/5 5/5 5/5 5/5  (-) Pronator drift Sensation grossly intact to LT  Imaging  CT scan from last night and this morning both personally reviewed and demonstrate acute bilateral <1cm convexity subdural hematomas with minimal local mass effect.  Mild 3 to 4 mm rightward midline shift.  There is old right frontoparietal stroke.  Impression  - 68 y.o. female with mechanical mitral valve chronically anticoagulated with acute bilateral convexity subdural hematoma.  Unfortunately she is at high risk for embolic event off anticoagulation given her mechanical valve.  At this point I would recommend holding the Coumadin  and Lovenox , although she may be a candidate for middle meningeal artery embolization in an attempt to minimize time off anticoagulation.  Nonetheless, at a minimum she will likely be off for at least a week.  Plan  - Cont to hold coumadin  and lovenox  for now - Will consider bilateral MMA embolization in order to minimize time off Coumadin /lovenox . If we can get this done early next week, would repeat CT about 1 week after and if stable then consider restart of [facility] with close outpatient clinical and radiologic follow-up.  Gerldine Maizes, MD [facility]

## 2023-07-23 NOTE — Evaluation (Signed)
 Clinical/Bedside Swallow Evaluation Patient Details  Name: Natasha Davis MRN: [id number] Date of Birth: [birth year]  Today's Date: 07/23/2023 Time: SLP Start Time (ACUTE ONLY): 1336 SLP Stop Time (ACUTE ONLY): 1346 SLP Time Calculation (min) (ACUTE ONLY): 10 min  Past Medical History:  Past Medical History:  Diagnosis Date   Chronic anticoagulation    Claudication (HCC)    History of CVA (cerebrovascular accident)    Hypertension    Mitral stenosis    Seizure (HCC)    HX   Past Surgical History:  Past Surgical History:  Procedure Laterality Date   CARDIAC CATHETERIZATION  06/01/96   NORMAL LEFT VENTRICULAR SYSTOLIC FUNCTION. EF 60%   CESAREAN SECTION     X2   MITRAL VALVE REPLACEMENT     HPI:  68 y.o. female presented to ED two days after D/C from hospital with worsening headache, seizure 6/28 am. CT head showed acute bilateral subdural hematoma with 3-35mm midline shift. PMHx seizure disorder, acute CVA (required admission from 6/23-6/25/2025 for breakthrough seizure and acute CVA), history of mitral valve replacement on Coumadin , GERD, hyperlipidemia, paroxysmal atrial fibrillation, HFrEF 40 to 45%.    Assessment / Plan / Recommendation  Clinical Impression  Pt presents with normal swallow function with thorough mastication of solids (despite absence of dentures; endorses she does not use dentures when eating), the appearance of a brisk swallow response, and no s/s of aspiration over multiple trials and textures. No dysphagia identified.  Recommend resuming a regular diet, thin liquids. No SLP f/u is needed. Our service will sign off. D/W RN. SLP Visit Diagnosis: Dysphagia, unspecified (R13.10)    Aspiration Risk  No limitations    Diet Recommendation   Age appropriate regular;Thin  Medication Administration: Whole meds with liquid    Other  Recommendations Oral Care Recommendations: Oral care BID         Functional Status Assessment Patient has not had a recent decline  in their functional status    Swallow Study   General Date of Onset: 07/22/23 HPI: 68 y.o. female presented to ED two days after D/C from hospital with worsening headache, seizure 6/28 am. CT head showed acute bilateral subdural hematoma with 3-66mm midline shift. PMHx seizure disorder, acute CVA (required admission from 6/23-6/25/2025 for breakthrough seizure and acute CVA), history of mitral valve replacement on Coumadin , GERD, hyperlipidemia, paroxysmal atrial fibrillation, HFrEF 40 to 45%. Type of Study: Bedside Swallow Evaluation Previous Swallow Assessment: no Diet Prior to this Study: NPO Temperature Spikes Noted: No Respiratory Status: Room air History of Recent Intubation: No Behavior/Cognition: Alert;Cooperative Oral Cavity Assessment: Within Functional Limits Oral Care Completed by SLP: No Oral Cavity - Dentition: Edentulous (does not wear dentures when eating per pt report) Vision: Functional for self-feeding Self-Feeding Abilities: Able to feed self Patient Positioning: Upright in bed Baseline Vocal Quality: Normal Volitional Cough: Strong Volitional Swallow: Able to elicit    Oral/Motor/Sensory Function     Ice Chips Ice chips: Within functional limits   Thin Liquid Thin Liquid: Within functional limits    Nectar Thick Nectar Thick Liquid: Not tested   Honey Thick Honey Thick Liquid: Not tested   Puree Puree: Within functional limits   Solid     Solid: Within functional limits      Vona Palma Laurice 07/23/2023,1:53 PM  Palma L. Vona, MA CCC/SLP Clinical Specialist - Acute Care SLP Acute Rehabilitation Services Office number [phone]

## 2023-07-23 NOTE — Consult Note (Signed)
 NEUROLOGY CONSULT NOTE   Date of service: July 23, 2023 Patient Name: Natasha Davis MRN:  [id number] DOB:  [birth year] Chief Complaint: Seizure Requesting Provider: Cheryle Page, MD  History of Present Illness  Natasha Davis is a 68 y.o. 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 She was admitted 6/23-6/25 after a seizure like event that resulted in a fall and she did report hitting her head. She was put on keppra  500mg  BID and discharged on 6/25. On 6/27 she was seen in the ED for headache and dizziness. Head CT showed bilateral subdural hematomas with a 4mm rightward shift and neurosurgery has been consulted. They are considering bilateral MMA embolization in order to minimize time off Coumadin /lovenox . 6/28 she reportedly had a breakthrough seizure at 9am and then again at 12pm and her keppra  was increased on 750mg  BID (she was given an additional 250mg  after receiving her scheduled 500mg ). She then had a 3 minute episode. Family reports all episodes as abnormal facial and eye lid movement. Throughout the episodes she is able to maintain conversation and she does not have any lapses in memory. She did receive ativan  and on my exam she is drowsy with poor attention and concentration.    ROS  Unable to complete due to altered mental status after ativan   Past History   Past Medical History:  Diagnosis Date   Chronic anticoagulation    Claudication [facility])    History of CVA (cerebrovascular accident)    Hypertension    Mitral stenosis    Seizure (HCC)    HX    Past Surgical History:  Procedure Laterality Date   CARDIAC CATHETERIZATION  06/01/96   NORMAL LEFT VENTRICULAR SYSTOLIC FUNCTION. EF 60%   CESAREAN SECTION     X2   MITRAL VALVE REPLACEMENT       Family History: Family History  Problem Relation Age of Onset   Hypertension Mother    Heart disease Father    Hypertension Father    Heart attack Father    Stroke Brother     Social History  reports that she quit smoking about 17 years ago. Her smoking use included cigarettes. She has never used smokeless tobacco. She reports that she does not currently use alcohol. She reports that she does not currently use drugs.  Allergies  Allergen Reactions   Penicillins Other (See Comments)    Couldn't walk    Medications   Current Facility-Administered Medications:    0.9 %  sodium chloride  infusion, 250 mL, Intravenous, PRN, Sundil, Subrina, MD   acetaminophen  (TYLENOL ) tablet 650 mg, 650 mg, Oral, Q6H PRN, 650 mg at 07/23/23 1155 **OR** acetaminophen  (TYLENOL ) suppository 650 mg, 650 mg, Rectal, Q6H PRN, Sundil, Subrina, MD   atorvastatin  (LIPITOR) tablet 20 mg, 20 mg, Oral, QHS, Sundil, Subrina, MD, 20 mg at 07/23/23 0010   fluconazole (DIFLUCAN) tablet 150 mg, 150 mg, Oral, Once, Alekh, Kshitiz, MD   furosemide  (LASIX ) tablet 40 mg, 40 mg, Oral, Daily, Sundil, Subrina, MD, 40 mg at 07/23/23 9057   hydrALAZINE (APRESOLINE) injection 10 mg, 10 mg, Intravenous, Q6H PRN, Sundil, Subrina, MD   levETIRAcetam  (KEPPRA ) undiluted injection 750 mg, 750 mg, Intravenous, BID, Alekh, Kshitiz, MD   LORazepam  (ATIVAN ) injection 2 mg, 2 mg, Intravenous, Q2H PRN, Cheryle Page, MD  metoCLOPramide (REGLAN) injection 10 mg, 10 mg, Intravenous, Q6H PRN, Cheryle, Kshitiz, MD, 10 mg at 07/23/23 1155   metoprolol  tartrate (LOPRESSOR ) tablet 25 mg, 25 mg, Oral, BID, Sundil, Subrina, MD, 25 mg at 07/23/23 0942   pantoprazole  (PROTONIX ) EC tablet 40 mg, 40 mg, Oral, Daily, Sundil, Subrina, MD, 40 mg at 07/23/23 0942   sodium chloride  flush (NS) 0.9 % injection 3 mL, 3 mL, Intravenous, Q12H, Sundil, Subrina, MD, 3 mL at 07/23/23 0944   sodium chloride  flush (NS) 0.9 % injection 3 mL, 3 mL, Intravenous,  Q12H, Sundil, Subrina, MD, 3 mL at 07/23/23 0944   sodium chloride  flush (NS) 0.9 % injection 3 mL, 3 mL, Intravenous, PRN, Sundil, Subrina, MD   triamterene -hydrochlorothiazide (MAXZIDE-25) 37.5-25 MG per tablet 1 tablet, 1 tablet, Oral, Daily, Sundil, Subrina, MD, 1 tablet at 07/23/23 0054   trimethobenzamide (TIGAN) injection 200 mg, 200 mg, Intramuscular, Q8H PRN, Sundil, Subrina, MD, 200 mg at 07/23/23 0434  Vitals   Vitals:   07/23/23 0800 07/23/23 0849 07/23/23 1257 07/23/23 1257  BP: (!) 160/66  (!) 152/69 (!) 152/69  Pulse: 65  62 62  Resp: 15  18 18   Temp:  98.7 F (37.1 C) 98.7 F (37.1 C) 98.7 F (37.1 C)  TempSrc:  Oral Oral Oral  SpO2: 99%  96% 96%  Weight:      Height:        Body mass index is 42.84 kg/m.   Physical Exam   Constitutional: Appears well-developed and well-nourished.  Psych: Drowsy Eyes: No scleral injection.  HENT: No OP obstruction.  Head: Normocephalic.  Cardiovascular: Normal rate and regular rhythm.  Respiratory: Effort normal, non-labored breathing.  GI: Soft.  No distension. There is no tenderness.  Skin: WDI.   Neurologic Examination    Neuro: Mental Status: Patient is drowsy, does open eyes with repeated stimulation.  She does state her name is Natasha Davis but does not answer any other questions.  Does not follow commands even with repeated stimulation States stopped and asked to be left alone but does not answer questions consistently Cranial Nerves: II: Blinks to threat bilaterally with forced eye opening. pupils are equal, round, and reactive to light.   III,IV, VI: EOMI without ptosis or diploplia.  V: Family reports diminished sensation on left face at baseline, unable to assess VII: Left facial asymmetry at rest VIII: Hearing is intact to voice X: Palate elevates symmetrically XI: Shoulder shrug is symmetric. XII: Tongue protrudes midline without atrophy or fasciculations.  Motor: Tone is normal. Bulk is normal.  Withdraws  to pain in all extremities, she does move all extremities antigravity however she does not maintain or participate in strength testing Sensory: Withdraws to pain in all extremities Cerebellar: Unable to complete     Labs/Imaging/Neurodiagnostic studies   CBC:  Recent Labs  Lab 08/17/23 1047 August 17, 2023 1215 07/22/23 2014 07/23/23 0427  WBC 8.0   < > 10.6* 13.8*  NEUTROABS 6.0  --  9.2*  --   HGB 12.1   < > 12.5 11.4*  HCT 38.4   < > 40.4 36.7  MCV 97.5   < > 99.5 98.7  PLT 228   < > 225 236   < > = values in this interval not displayed.   Basic Metabolic Panel:  Lab Results  Component Value Date   NA 138 07/23/2023   K 4.7 07/23/2023   CO2 23 07/23/2023   GLUCOSE 128 (H) 07/23/2023   BUN 12 07/23/2023  CREATININE 0.86 07/23/2023   CALCIUM  9.7 07/23/2023   GFRNONAA >60 07/23/2023   GFRAA 106 03/26/2016   Lipid Panel:  Lab Results  Component Value Date   LDLCALC 52 07/19/2023   HgbA1c:  Lab Results  Component Value Date   HGBA1C 5.7 (H) 07/19/2023   Urine Drug Screen:     Component Value Date/Time   LABOPIA NONE DETECTED 07/18/2023 1238   COCAINSCRNUR NONE DETECTED 07/18/2023 1238   LABBENZ NONE DETECTED 07/18/2023 1238   AMPHETMU NONE DETECTED 07/18/2023 1238   THCU NONE DETECTED 07/18/2023 1238   LABBARB NONE DETECTED 07/18/2023 1238    Alcohol Level     Component Value Date/Time   ETH <15 07/18/2023 1050   INR  Lab Results  Component Value Date   INR 1.8 (H) 07/23/2023   APTT  Lab Results  Component Value Date   APTT 37 (H) 07/23/2023   AED levels: No results found for: PHENYTOIN, ZONISAMIDE, LAMOTRIGINE, LEVETIRACETA  6/27 2135 CT Head without contrast(Personally reviewed): Acute bilateral subdural hematomas measuring 8 mm on the right and till meters on the left. Hemorrhage tracks along the falx and tentorial leaflets. Associated 5 mm of rightward midline shift.  6/28 0450- CT Head (Personally reviewed): 1. Stable bilateral  Subdural Hematomas, generally hyperdense and 6-8 mm thickness bilaterally. 2. Stable mild intracranial mass effect with 4 mm of rightward midline shift. 3. No new intracranial abnormality. Chronic right MCA territory infarct.  6/28 0950 CT Head (Personally reviewed): Bilateral Subdural Hematoma with rightward midline shift of 3-4 mm.  Neurodiagnostics cEEG:  Pending  ASSESSMENT   Akaylah W Steinkamp is a 68 y.o. 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  Neurology consulted for seizure like activity x 3 on the floor. She does have acute bilateral subdural hematomas and chronic RMCA stroke - all of which could be causing her breakthrough seizures.  Neurosurgery is following and considering MMA embolization. She is currently on Keppra  750mg  BID and has received a 3g bolus. Additionally we will connect her to LTM.    Impression Breakthrough seizures Bilateral subdural hematomas Chronic right MCA stroke  RECOMMENDATIONS  - Load 3g Keppra  now - Continue 750mg  BID - Overnight EEG ordered - Seizure precautions  -Appreciate neurosurgical recommendations Will follow ______________________________________________________________________    Signed, Jorene Last, NP [facility]  Attending Neurohospitalist Addendum Patient seen and examined with APP/Resident. Agree with the history and physical as documented above. Agree with the plan as documented, which I helped formulate. I have independently reviewed the chart, obtained history, review of systems and examined the patient.I have personally reviewed pertinent head/neck/spine imaging (CT/MRI).  Plan relayed to  Dr. Cheryle Please feel free to call with any questions.  -- Eligio Lav, MD Neurologist [facility] Pager: [phone]

## 2023-07-23 NOTE — ED Notes (Signed)
 Pt unable to follow rns finger with her eyes. Pt will look side to side and up but pt will not look down wile following Rns finger. Family reports this is different from before. RN informed hospitalist

## 2023-07-23 NOTE — ED Notes (Signed)
 Assisted pt with bedside commode, pt repositioned in bed & warm blankets provided. Call light within reach

## 2023-07-23 NOTE — ED Notes (Signed)
 Pt seized lasting 60 sec

## 2023-07-23 NOTE — ED Notes (Signed)
 CCMD notified of room change

## 2023-07-23 NOTE — Progress Notes (Signed)
 Cardiology Progress Note  Patient ID: Natasha Davis MRN: [id number] DOB: 1955-11-21 Date of Encounter: 07/23/2023 Primary Cardiologist: Aleene Passe, MD  Subjective   Chief Complaint: Headache   HPI: Admitted with headache and found to have bilateral subdural hematomas.  Does have intracranial mass effect with 4 mm of rightward shift.  Chronic MCA infarct.  No neurological deficits.  Anticoagulation being held.  No chest pain or trouble breathing.  ROS:  All other ROS reviewed and negative. Pertinent positives noted in the HPI.     Telemetry  Overnight telemetry shows atrial fibrillation heart rate 60s, which I personally reviewed.   ECG  The most recent ECG shows Afib, which I personally reviewed.   Physical Exam   Vitals:   07/23/23 0055 07/23/23 0230 07/23/23 0341 07/23/23 0445  BP: (!) 154/81 (!) 152/63 (!) 149/67 (!) 143/76  Pulse: 84 76 87 75  Resp:  (!) 23  (!) 24  Temp:   98.9 F (37.2 C)   TempSrc:   Oral   SpO2:  95% 98% 96%  Weight:      Height:       No intake or output data in the 24 hours ending 07/23/23 0805     07/22/2023    7:35 PM 07/21/2023   10:41 AM 07/18/2023   10:31 AM  Last 3 Weights  Weight (lbs) 212 lb 1.3 oz 212 lb 212 lb 1.3 oz  Weight (kg) 96.2 kg 96.163 kg 96.2 kg    Body mass index is 42.84 kg/m.  General: Well nourished, well developed, in no acute distress Head: Atraumatic, normal size  Eyes: PEERLA, EOMI  Neck: Supple, no JVD Endocrine: No thryomegaly Cardiac: Mechanical S1, normal S2, irregular rate Lungs: Clear to auscultation bilaterally, no wheezing, rhonchi or rales  Abd: Soft, nontender, no hepatomegaly  Ext: No edema, pulses 2+ Musculoskeletal: No deformities, BUE and BLE strength normal and equal Skin: Warm and dry, no rashes   Neuro: Alert and oriented to person, place, time, and situation, CNII-XII grossly intact, no focal deficits  Psych: Normal mood and affect   Cardiac Studies  TTE 07/19/2023  1. Left  ventricular ejection fraction, by estimation, is 40 to 45%. The  left ventricle has mildly decreased function. The left ventricle  demonstrates global hypokinesis. There is moderate concentric left  ventricular hypertrophy. Left ventricular  diastolic parameters are indeterminate.   2. Right ventricular systolic function is mildly reduced. The right  ventricular size is normal. There is normal pulmonary artery systolic  pressure.   3. Left atrial size was severely dilated.   4. The mitral valve has been repaired/replaced. No evidence of mitral  valve regurgitation. The mean mitral valve gradient is 4.3 mmHg.   5. The aortic valve is tricuspid. There is mild calcification of the  aortic valve. Aortic valve regurgitation is not visualized. Aortic valve  sclerosis is present, with no evidence of aortic valve stenosis.   6. The inferior vena cava is normal in size with <50% respiratory  variability, suggesting right atrial pressure of 8 mmHg.   Patient Profile  Natasha Davis is a 68 y.o. female with persistent atrial fibrillation, mechanical mitral valve replacement, systolic heart failure (EF 40-45%), stroke, acid reflux admitted on 07/22/2023 with headache and subdural hematoma.  Cardiology consulted for anticoagulation management.  Assessment & Plan   # Bilateral subdural hematomas # Mechanical mitral valve, INR Goal 2.5-3.5 # Persistent Afib # Chronic systolic heart failure, EF 40-45% # Recent L Parietal Lobe  Stroke  - Recently admitted earlier this week with what appears to be an acute left parietal lobe stroke in the setting of subtherapeutic anticoagulation. - There was question of scalp hematoma and possible subdural hematoma.  This was ruled out.  She was discharged on Lovenox . - Now returns with bilateral subdural hematomas. - Anticoagulation should be held.  She seems to be stable.  She has not been reversed from her vitamin K antagonist.  If neurosurgery feels this is needed  this is okay with cardiology.  Clearly will hold anticoagulation until this can be reinitiated.  I will defer to neurosurgery on the timing of this.  In the current timeframe it is okay to continue with holding anticoagulation.  Again we will restart when instructed to by neurosurgery. - INR goal 2.5-3.5.  I do not think we need to increase this.  She did have a recent stroke but it seems to be related to subtherapeutic INR.   - From a heart failure standpoint she is stable.  Okay to continue current medications. - Cardiology to follow along.     For questions or updates, please contact [facility] Please consult [email or web address] for contact info under      Signed, Darryle T. Barbaraann, MD, [facility] [place]  [facility]  07/23/2023 8:05 AM

## 2023-07-23 NOTE — Plan of Care (Signed)
 Called by Dr. Cheryle regarding this patient-subdural hematomas bilaterally, being evaluated by neurosurgery for MMA embolization.  Breakthrough seizure. Currently on Keppra  500 twice daily. Recently seen for acute ischemic infarct, subdural hematoma and seizure breakthrough. I recommended increasing Keppra  to 750 twice daily since she is back to baseline. Please call neurology with questions as needed   -- Eligio Lav, MD Neurologist [facility]

## 2023-07-23 NOTE — ED Notes (Signed)
 Patient transported to CT

## 2023-07-23 NOTE — Plan of Care (Signed)

## 2023-07-23 NOTE — ED Notes (Signed)
 Pt requested to have help ambulating to restroom in treatment room. Assisted pt to standing position when pt stated she felt dizzy. Pt assisted back on ED stretcher.

## 2023-07-23 NOTE — ED Notes (Signed)
 Pt family reporting increased agitation.

## 2023-07-24 ENCOUNTER — Inpatient Hospital Stay (HOSPITAL_COMMUNITY)

## 2023-07-24 DIAGNOSIS — I6201 Nontraumatic acute subdural hemorrhage: Secondary | ICD-10-CM | POA: Diagnosis not present

## 2023-07-24 DIAGNOSIS — I69354 Hemiplegia and hemiparesis following cerebral infarction affecting left non-dominant side: Secondary | ICD-10-CM | POA: Diagnosis not present

## 2023-07-24 DIAGNOSIS — S065XAA Traumatic subdural hemorrhage with loss of consciousness status unknown, initial encounter: Secondary | ICD-10-CM | POA: Diagnosis not present

## 2023-07-24 DIAGNOSIS — I5042 Chronic combined systolic (congestive) and diastolic (congestive) heart failure: Secondary | ICD-10-CM | POA: Diagnosis not present

## 2023-07-24 DIAGNOSIS — R569 Unspecified convulsions: Secondary | ICD-10-CM | POA: Diagnosis not present

## 2023-07-24 DIAGNOSIS — I4819 Other persistent atrial fibrillation: Secondary | ICD-10-CM | POA: Diagnosis not present

## 2023-07-24 DIAGNOSIS — Z952 Presence of prosthetic heart valve: Secondary | ICD-10-CM | POA: Diagnosis not present

## 2023-07-24 LAB — MAGNESIUM: Magnesium: 1.7 mg/dL (ref 1.7–2.4)

## 2023-07-24 LAB — CBC WITH DIFFERENTIAL/PLATELET
Abs Immature Granulocytes: 0.04 10*3/uL (ref 0.00–0.07)
Basophils Absolute: 0 10*3/uL (ref 0.0–0.1)
Basophils Relative: 0 %
Eosinophils Absolute: 0 10*3/uL (ref 0.0–0.5)
Eosinophils Relative: 0 %
HCT: 34 % — ABNORMAL LOW (ref 36.0–46.0)
Hemoglobin: 10.9 g/dL — ABNORMAL LOW (ref 12.0–15.0)
Immature Granulocytes: 0 %
Lymphocytes Relative: 7 %
Lymphs Abs: 1 10*3/uL (ref 0.7–4.0)
MCH: 30.6 pg (ref 26.0–34.0)
MCHC: 32.1 g/dL (ref 30.0–36.0)
MCV: 95.5 fL (ref 80.0–100.0)
Monocytes Absolute: 1.5 10*3/uL — ABNORMAL HIGH (ref 0.1–1.0)
Monocytes Relative: 11 %
Neutro Abs: 11.3 10*3/uL — ABNORMAL HIGH (ref 1.7–7.7)
Neutrophils Relative %: 82 %
Platelets: 224 10*3/uL (ref 150–400)
RBC: 3.56 MIL/uL — ABNORMAL LOW (ref 3.87–5.11)
RDW: 13.1 % (ref 11.5–15.5)
WBC: 13.9 10*3/uL — ABNORMAL HIGH (ref 4.0–10.5)
nRBC: 0 % (ref 0.0–0.2)

## 2023-07-24 LAB — COMPREHENSIVE METABOLIC PANEL WITH GFR
ALT: 76 U/L — ABNORMAL HIGH (ref 0–44)
AST: 100 U/L — ABNORMAL HIGH (ref 15–41)
Albumin: 3.9 g/dL (ref 3.5–5.0)
Alkaline Phosphatase: 93 U/L (ref 38–126)
Anion gap: 13 (ref 5–15)
BUN: 15 mg/dL (ref 8–23)
CO2: 24 mmol/L (ref 22–32)
Calcium: 9.2 mg/dL (ref 8.9–10.3)
Chloride: 94 mmol/L — ABNORMAL LOW (ref 98–111)
Creatinine, Ser: 0.96 mg/dL (ref 0.44–1.00)
GFR, Estimated: >60 mL/min (ref >60)
Glucose, Bld: 137 mg/dL — ABNORMAL HIGH (ref 70–99)
Potassium: 4.3 mmol/L (ref 3.5–5.1)
Sodium: 131 mmol/L — ABNORMAL LOW (ref 135–145)
Total Bilirubin: 1.4 mg/dL — ABNORMAL HIGH (ref 0.0–1.2)
Total Protein: 7.6 g/dL (ref 6.5–8.1)

## 2023-07-24 LAB — PROTIME-INR
INR: 1.8 — ABNORMAL HIGH (ref 0.8–1.2)
Prothrombin Time: 21.8 s — ABNORMAL HIGH (ref 11.4–15.2)

## 2023-07-24 MED ORDER — SPIRONOLACTONE 25 MG PO TABS
25.0000 mg | ORAL_TABLET | Freq: Every day | ORAL | Status: DC
  Administered 2023-07-24 – 2023-08-08 (×15): 25 mg via ORAL
  Filled 2023-07-24 (×16): qty 1

## 2023-07-24 MED ORDER — LOSARTAN POTASSIUM 50 MG PO TABS
50.0000 mg | ORAL_TABLET | Freq: Every day | ORAL | Status: DC
  Administered 2023-07-24 – 2023-08-08 (×16): 50 mg via ORAL
  Filled 2023-07-24 (×16): qty 1

## 2023-07-24 MED ORDER — METOPROLOL SUCCINATE ER 25 MG PO TB24
25.0000 mg | ORAL_TABLET | Freq: Every day | ORAL | Status: DC
  Administered 2023-07-24 – 2023-08-08 (×16): 25 mg via ORAL
  Filled 2023-07-24 (×16): qty 1

## 2023-07-24 NOTE — Plan of Care (Signed)
  Problem: Education: Goal: Knowledge of General Education information will improve Description: Including pain rating scale, medication(s)/side effects and non-pharmacologic comfort measures Outcome: Progressing   Problem: Health Behavior/Discharge Planning: Goal: Ability to manage health-related needs will improve Outcome: Progressing   Problem: Clinical Measurements: Goal: Will remain free from infection Outcome: Progressing Goal: Diagnostic test results will improve Outcome: Progressing Goal: Cardiovascular complication will be avoided Outcome: Progressing

## 2023-07-24 NOTE — Progress Notes (Signed)
 PROGRESS NOTE    Natasha Davis  FMW:[id number] DOB: [birth year] DOA: 07/22/2023 PCP: Marvine Rush, MD   Brief Narrative:  68 y.o. female with medical history significant of seizure disorder, acute CVA (required admission from 07/18/2023-07/20/2023 for breakthrough seizure and acute CVA requiring increase in dose of Keppra  by neurology), history of mitral valve replacement on Coumadin , GERD, hyperlipidemia, paroxysmal atrial fibrillation, HFrEF 40 to 45% and morbid obesity presented with worsening headache.  On presentation, CT head showed acute bilateral subdural hematoma with some shift.  Neurosurgery/Dr. Lanis was consulted: Recommended to hold Lovenox  and warfarin and recommended to repeat CT head in 8 to 10 hours.  Cardiology was also consulted for recommendations regarding anticoagulation given the fact that patient has a history of mechanical mitral valve.  Subsequently, patient had breakthrough seizures: Keppra  dose was increased.  Neurology was consulted: LTM EEG started.  Assessment & Plan:   Subdural hematoma Hyperlipidemia Intractable headache History of recent acute incidental CVA: In the setting of subtherapeutic INR and the patient with mechanical mitral valve - Imaging as above.  CT head repeated as per neurosurgery recommendations showed stable bilateral subdural hematomas with some mass effect and midline shift.  Neurosurgery recommending to continue to hold Lovenox  and Coumadin  for now.  Patient might need bilateral MMA embolization early next week. - Monitor mental status.  Fall precautions. - Continue statin.   Breakthrough seizures in a patient with history of seizure disorder - Patient had few seizures in the hospital on 07/23/2023.  Neurology was consulted.  Keppra  dose has been increased to 750 mg IV twice daily.  LTM EEG has been started.  Seizure precaution.  Follow neurology recommendations  Persistent A-fib Prolonged QT interval - Currently rate controlled.   Continue metoprolol .  Anticoagulation on hold as above.  Telemetry monitoring.  Avoid QT prolonging medications.  Cardiology following.  History of mechanical mitral valve replacement for mitral stenosis -Anticoagulation to remain on hold till neurosurgery clearance to resume it.  Cardiology following.  Chronic systolic heart failure Hypertension - Currently compensated.  Continue Lasix , metoprolol , triamterene -hydrochlorothiazide  Leukocytosis - Possibly reactive.  Monitor  Morbid obesity - Outpatient follow-up  DVT prophylaxis: SCDs Code Status: Full Family Communication: Daughter-in-law at bedside  Disposition Plan: Status is: Inpatient Remains inpatient appropriate because: Of severity of illness    Consultants: Neurosurgery/cardiology/neurology  Procedures: As above Antimicrobials: None   Subjective: Patient seen and examined at bedside.  Remains slow to respond and a poor historian.  Apparently had seizures yesterday as per nursing staff.  No fever, chest pain or shortness of breath reported.  Complains of intermittent nausea  Objective: Vitals:   07/23/23 1639 07/23/23 1952 07/24/23 0001 07/24/23 0737  BP: (!) 144/63 (!) 149/72 (!) 145/68 (!) 149/73  Pulse: 65 71 78 70  Resp: 18 16 16 17   Temp: 98.5 F (36.9 C) 98.3 F (36.8 C) 98.3 F (36.8 C) 97.6 F (36.4 C)  TempSrc: Oral Oral Oral Oral  SpO2: 96% 97% 95% 96%  Weight:      Height:        Intake/Output Summary (Last 24 hours) at 07/24/2023 0759 Last data filed at 07/24/2023 0600 Gross per 24 hour  Intake 7.5 ml  Output --  Net 7.5 ml   Filed Weights   07/22/23 1935  Weight: 96.2 kg    Examination:  General: On room air.  No distress.  Chronically ill and deconditioned looking.  Undergoing LTM EEG ENT/neck: No thyromegaly.  JVD is not elevated  respiratory:  Decreased breath sounds at bases bilaterally with some crackles; no wheezing  CVS: S1-S2 heard, rate controlled currently Abdominal:  Soft, morbidly obese, nontender, slightly distended; no organomegaly, bowel sounds are heard Extremities: Trace lower extremity edema; no cyanosis  CNS: Slow to respond.  Poor historian.  No focal neurologic deficit.  Moves extremities Lymph: No obvious lymphadenopathy Skin: No obvious ecchymosis/lesions  psych: Mostly flat affect.  Currently not agitated musculoskeletal: No obvious joint swelling/deformity     Data Reviewed: I have personally reviewed following labs and imaging studies  CBC: Recent Labs  Lab 07/18/23 1047 07/18/23 1215 07/19/23 0443 07/20/23 0433 07/22/23 2014 07/23/23 0427 07/24/23 0720  WBC 8.0  --  7.3 5.5 10.6* 13.8* 13.9*  NEUTROABS 6.0  --   --   --  9.2*  --  11.3*  HGB 12.1   < > 10.5* 10.3* 12.5 11.4* 10.9*  HCT 38.4   < > 32.9* 32.7* 40.4 36.7 34.0*  MCV 97.5  --  96.5 97.0 99.5 98.7 95.5  PLT 228  --  205 201 225 236 224   < > = values in this interval not displayed.   Basic Metabolic Panel: Recent Labs  Lab 07/18/23 1047 07/18/23 1215 07/19/23 0443 07/22/23 2014 07/23/23 0427  NA 137 138 135 135 138  K 3.7 3.6 3.8 5.0 4.7  CL 99 101 104 99 99  CO2 21*  --  22 25 23   GLUCOSE 171* 164* 138* 135* 128*  BUN 17 19 12 11 12   CREATININE 1.14* 1.10* 0.97 0.93 0.86  CALCIUM  9.3  --  8.4* 10.0 9.7   GFR: Estimated Creatinine Clearance: 62.8 mL/min (by C-G formula based on SCr of 0.86 mg/dL). Liver Function Tests: Recent Labs  Lab 07/18/23 1047 07/19/23 0443 07/23/23 0427  AST 41 35 83*  ALT 25 21 52*  ALKPHOS 102 85 97  BILITOT 1.0 1.1 1.2  PROT 7.7 6.1* 7.7  ALBUMIN 4.2 3.5 4.1   No results for input(s): LIPASE, AMYLASE in the last 168 hours. No results for input(s): AMMONIA in the last 168 hours. Coagulation Profile: Recent Labs  Lab 07/19/23 0443 07/20/23 0433 07/22/23 1201 07/22/23 2014 07/23/23 0427  INR 1.8* 2.1* 2.8 2.1* 1.8*   Cardiac Enzymes: No results for input(s): CKTOTAL, CKMB, CKMBINDEX,  TROPONINI in the last 168 hours. BNP (last 3 results) No results for input(s): PROBNP in the last 8760 hours. HbA1C: No results for input(s): HGBA1C in the last 72 hours. CBG: Recent Labs  Lab 07/23/23 0934  GLUCAP 128*   Lipid Profile: No results for input(s): CHOL, HDL, LDLCALC, TRIG, CHOLHDL, LDLDIRECT in the last 72 hours. Thyroid  Function Tests: No results for input(s): TSH, T4TOTAL, FREET4, T3FREE, THYROIDAB in the last 72 hours. Anemia Panel: No results for input(s): VITAMINB12, FOLATE, FERRITIN, TIBC, IRON, RETICCTPCT in the last 72 hours. Sepsis Labs: No results for input(s): PROCALCITON, LATICACIDVEN in the last 168 hours.  No results found for this or any previous visit (from the past 240 hours).       Radiology Studies: CT HEAD WO CONTRAST ( ) Result Date: 07/23/2023 CLINICAL DATA:  68 year old female with bilateral subdural hematoma. EXAM: CT HEAD WITHOUT CONTRAST TECHNIQUE: Contiguous axial images were obtained from the base of the skull through the vertex without intravenous contrast. RADIATION DOSE REDUCTION: This exam was performed according to the departmental dose-optimization program which includes automated exposure control, adjustment of the mA and/or kV according to patient size and/or use of iterative reconstruction technique. COMPARISON:  0458 hours today and earlier. FINDINGS: Brain: Bilateral mostly hyperdense but mixed density subdural hematoma is stable since 0458 hours today, generally 6 mm in thickness on the left and more lobulated on the right, generally 6-8 mm on that side. Rightward midline shift of 3-4 mm is stable on coronal image 46. Stable ventricle size and configuration. Stable gray-white matter differentiation throughout the brain. No new intracranial hemorrhage. Chronic right hemisphere encephalomalacia. Basilar cisterns remain patent. Vascular: Calcified atherosclerosis at the skull base. Skull:  Intact, stable. Sinuses/Orbits: Visualized paranasal sinuses and mastoids are stable and well aerated. Other: Visualized orbits and scalp soft tissues are within normal limits. IMPRESSION: Stable from 0458 hours today. Bilateral Subdural Hematoma with rightward midline shift of 3-4 mm. Electronically Signed   By: VEAR Hurst M.D.   On: 07/23/2023 10:11   CT Head Wo Contrast Result Date: 07/23/2023 CLINICAL DATA:  68 year old female with bilateral subdural hematoma. EXAM: CT HEAD WITHOUT CONTRAST TECHNIQUE: Contiguous axial images were obtained from the base of the skull through the vertex without intravenous contrast. RADIATION DOSE REDUCTION: This exam was performed according to the departmental dose-optimization program which includes automated exposure control, adjustment of the mA and/or kV according to patient size and/or use of iterative reconstruction technique. COMPARISON:  Head CT yesterday and earlier. FINDINGS: Brain: Mostly hyperdense bilateral subdural hematoma. Left-side hemorrhage thickness which is generally 6 mm (although approaching 10 mm at the left operculum on coronal image 45), stable. Lobulated right side subdural hematoma superimposed on chronic right hemisphere encephalomalacia is generally 6-8 mm in thickness and stable. Blood layering along the bilateral tentorium and falx again noted. Stable intracranial mass effect with rightward midline shift of 4 mm and partially effaced left lateral ventricle (coronal image 44 and series 3 image 17. No ventriculomegaly. No new intracranial hemorrhage identified. Basilar cisterns remain patent. Stable gray-white matter differentiation throughout the brain. Chronic right MCA territory cystic encephalomalacia. Small chronic left thalamic lacunar infarct. Vascular: Stable.  Calcified atherosclerosis at the skull base. Skull: Stable and intact. Sinuses/Orbits: Visualized paranasal sinuses and mastoids are stable and well aerated. Other: Orbit and scalp soft  tissues appears stable and negative. IMPRESSION: 1. Stable bilateral Subdural Hematomas, generally hyperdense and 6-8 mm thickness bilaterally. 2. Stable mild intracranial mass effect with 4 mm of rightward midline shift. 3. No new intracranial abnormality. Chronic right MCA territory infarct. Electronically Signed   By: VEAR Hurst M.D.   On: 07/23/2023 05:20   CT Head Wo Contrast Result Date: 07/22/2023 CLINICAL DATA:  Headache, tension-type recent stroke, on blood thinner EXAM: CT HEAD WITHOUT CONTRAST TECHNIQUE: Contiguous axial images were obtained from the base of the skull through the vertex without intravenous contrast. RADIATION DOSE REDUCTION: This exam was performed according to the departmental dose-optimization program which includes automated exposure control, adjustment of the mA and/or kV according to patient size and/or use of iterative reconstruction technique. COMPARISON:  CT head July 18, 2023. FINDINGS: Brain: Acute subdural hematomas along bilateral cerebral convexities measuring up to 8 mm in thickness on the right and 10 mm on the left. Acute hemorrhage tracks along the falx and bilateral tentorial leaflets with tentorial hemorrhage measuring 5 mm in thickness on the left and 3 mm on the right. Associated mass effect with 5 mm of rightward midline shift. Remote right MCA territory infarct, unchanged. Small remote left parietal infarct. No evidence of acute large vascular territory infarct or hydrocephalus. Vascular: No hyperdense vessel identified. Skull: No acute fracture. Sinuses/Orbits: Clear sinuses.  No acute  orbital findings. IMPRESSION: Acute bilateral subdural hematomas measuring 8 mm on the right and till meters on the left. Hemorrhage tracks along the falx and tentorial leaflets. Associated 5 mm of rightward midline shift. Findings discussed with Dr. Patsey via telephone at 9:31 p.m. Electronically Signed   By: Gilmore GORMAN Molt M.D.   On: 07/22/2023 21:35        Scheduled  Meds:  atorvastatin   20 mg Oral QHS   fluconazole  150 mg Oral Once   furosemide   40 mg Oral Daily   levETIRAcetam   750 mg Intravenous BID   metoprolol  tartrate  25 mg Oral BID   pantoprazole   40 mg Oral Daily   sodium chloride  flush  3 mL Intravenous Q12H   sodium chloride  flush  3 mL Intravenous Q12H   triamterene -hydrochlorothiazide  1 tablet Oral Daily   Continuous Infusions:          Sophie Mao, MD [facility] 07/24/2023, 7:59 AM

## 2023-07-24 NOTE — Progress Notes (Signed)
 Cardiology Progress Note  Patient ID: Natasha Davis MRN: [id number] DOB: 1955-08-18 Date of Encounter: 07/24/2023 Primary Cardiologist: Aleene Passe, MD  Subjective   Chief Complaint: None.   HPI: Denies chest pain or trouble breathing.  ROS:  All other ROS reviewed and negative. Pertinent positives noted in the HPI.     Telemetry  Overnight telemetry shows A-fib heart rate 60s, which I personally reviewed.    Physical Exam   Vitals:   07/23/23 1639 07/23/23 1952 07/24/23 0001 07/24/23 0737  BP: (!) 144/63 (!) 149/72 (!) 145/68 (!) 149/73  Pulse: 65 71 78 70  Resp: 18 16 16 17   Temp: 98.5 F (36.9 C) 98.3 F (36.8 C) 98.3 F (36.8 C) 97.6 F (36.4 C)  TempSrc: Oral Oral Oral Oral  SpO2: 96% 97% 95% 96%  Weight:      Height:        Intake/Output Summary (Last 24 hours) at 07/24/2023 0918 Last data filed at 07/24/2023 0600 Gross per 24 hour  Intake 7.5 ml  Output --  Net 7.5 ml       07/22/2023    7:35 PM 07/21/2023   10:41 AM 07/18/2023   10:31 AM  Last 3 Weights  Weight (lbs) 212 lb 1.3 oz 212 lb 212 lb 1.3 oz  Weight (kg) 96.2 kg 96.163 kg 96.2 kg    Body mass index is 42.84 kg/m.  General: Well nourished, well developed, in no acute distress Head: Atraumatic, normal size  Eyes: PEERLA, EOMI  Neck: Supple, no JVD Endocrine: No thryomegaly Cardiac: Mechanical S1, no murmurs, regular rhythm Lungs: Clear to auscultation bilaterally, no wheezing, rhonchi or rales  Abd: Soft, nontender, no hepatomegaly  Ext: No edema, pulses 2+ Musculoskeletal: No deformities, BUE and BLE strength normal and equal Skin: Warm and dry, no rashes   Neuro: Alert and oriented to person, place, time, and situation, CNII-XII grossly intact, no focal deficits  Psych: Normal mood and affect   Cardiac Studies  TTE 07/19/2023  1. Left ventricular ejection fraction, by estimation, is 40 to 45%. The  left ventricle has mildly decreased function. The left ventricle  demonstrates  global hypokinesis. There is moderate concentric left  ventricular hypertrophy. Left ventricular  diastolic parameters are indeterminate.   2. Right ventricular systolic function is mildly reduced. The right  ventricular size is normal. There is normal pulmonary artery systolic  pressure.   3. Left atrial size was severely dilated.   4. The mitral valve has been repaired/replaced. No evidence of mitral  valve regurgitation. The mean mitral valve gradient is 4.3 mmHg.   5. The aortic valve is tricuspid. There is mild calcification of the  aortic valve. Aortic valve regurgitation is not visualized. Aortic valve  sclerosis is present, with no evidence of aortic valve stenosis.   6. The inferior vena cava is normal in size with <50% respiratory  variability, suggesting right atrial pressure of 8 mmHg.   Patient Profile  Natasha Davis is a 68 y.o. female with mechanical mitral valve, persistent atrial fibrillation, systolic heart failure (EF 40-45%), stroke, hypertension admitted on 07/22/2023 with subdural hematomas.  Cardiology consulted for anticoagulation management in the setting of mechanical mitral valve.  Assessment & Plan   # Bilateral subdural hematomas # Mechanical mitral valve - Anticoagulation is being held at the discretion of neurosurgery.  This is okay to do. -They are tentatively planning for bilateral MMA embolization on Tuesday, 07/26/2023.  Okay to proceed with this from a cardiovascular standpoint. -  We will then plan to restart anticoagulation once this is deemed safe per neurosurgery.  Suspect she will be in house with heparin  bridging for Coumadin  and close monitoring given subdural hematomas.  If neurosurgery feels Lovenox  Coumadin  are okay that could also be pursued.  Again we will await their assessment on this. - INR goal 2.5-3.5  # Recent L Parietal CVA - Admitted earlier last week with acute stroke.  This was in the setting of INR being subtherapeutic.  I believe  this is the culprit.  I do not believe she needs her INR goal raise at this time.  # Chronic systolic heart failure, EF 40-45% - No signs of volume overload.  Continue Lasix  40 mg daily. - Stop metoprolol  to tartrate.  Transition to metoprolol  succinate 25 mg daily. - Stop triamterene  HCTZ.  Start losartan 50 mg daily.  Add Aldactone  25 mg daily.  Can consider SGLT2 inhibitor closer to discharge.  Can also consider transition to Entresto.  # Persistent atrial fibrillation - Rate controlled on metoprolol .  Anticoagulation discussion as above.      For questions or updates, please contact [facility] Please consult [email or web address] for contact info under        Signed, Darryle T. Barbaraann, MD, [facility] [place]  [facility]  07/24/2023 9:18 AM

## 2023-07-24 NOTE — Progress Notes (Signed)
 LTM EEG D/C'd. No noted skin break down. [facility] notified.

## 2023-07-24 NOTE — Procedures (Signed)
 Patient Name: Natasha Davis  MRN: [id number]  Epilepsy Attending: Arlin MALVA Krebs  Referring Physician/Provider: Remi Pippin, NP  Duration: 07/23/2023 1740 to 07/24/2023 1704  Patient history: 68 y.o. female with mechanical mitral valve and acute bilateral SDH, currently non-operative.  EEG to evaluate for seizure  Level of alertness: Awake, asleep  AEDs during EEG study: LEV  Technical aspects: This EEG study was done with scalp electrodes positioned according to the 10-20 International system of electrode placement. Electrical activity was reviewed with band pass filter of 1-70Hz , sensitivity of 7 uV/mm, display speed of 45mm/sec with a 60Hz  notched filter applied as appropriate. EEG data were recorded continuously and digitally stored.  Video monitoring was available and reviewed as appropriate.  Description: EEG initially showed continuous generalized 3 to 6 Hz theta-delta slowing admixed with 12-15hz  beta activity. Gradually after around 2145 on 07/23/2023, EEG improved and showed posterior dominant rhythm of 8-9 Hz activity of moderate voltage (25-35 uV) seen predominantly in posterior head regions, symmetric and reactive to eye opening and eye closing. Sleep was characterized by vertex waves, sleep spindles (12 to 14 Hz), maximal frontocentral region. Intermittent generalized 5-6hz  theta slowing was also noted. Hyperventilation and photic stimulation were not performed.     ABNORMALITY - Continuous slow, generalized  IMPRESSION: This study was initially suggestive of moderate to severe diffuse encephalopathy which gradually improved to mild diffuse encephalopathy. No seizures or epileptiform discharges were seen throughout the recording.  Natasha Davis

## 2023-07-24 NOTE — Progress Notes (Signed)
  NEUROSURGERY PROGRESS NOTE   No issues overnight. Pt with unchanged HA, no new N/T/W.   EXAM:  BP (!) 149/73 (BP Location: Left Wrist)   Pulse 70   Temp 97.6 F (36.4 C) (Oral)   Resp 17   Ht 4' 11 (1.499 m)   Wt 96.2 kg   SpO2 96%   BMI 42.84 kg/m   Awake, alert, oriented  Speech fluent, appropriate  CN grossly intact  5/5 BUE/BLE   IMPRESSION:  68 y.o. female with mechanical mitral valve and acute bilateral SDH, currently non-operative. Candidate for bilateral MMA embolization in order to minimize time off [facility] given high risk for embolic complications with valve.  PLAN: - Cont current supportive care - Tentatively plan on endovascular bilateral MMA embolization on Tuesday, 7/1   Gerldine Maizes, MD [facility]

## 2023-07-24 NOTE — Progress Notes (Signed)
 NEUROLOGY CONSULT FOLLOW UP NOTE   Date of service: July 24, 2023 Patient Name: Natasha Davis MRN:  [id number] DOB:  1955/02/07  Interval Hx/subjective  Seen and examined Awake alert Overnight EEG-continuous generalized slowing Vitals   Vitals:   07/23/23 1639 07/23/23 1952 07/24/23 0001 07/24/23 0737  BP: (!) 144/63 (!) 149/72 (!) 145/68 (!) 149/73  Pulse: 65 71 78 70  Resp: 18 16 16 17   Temp: 98.5 F (36.9 C) 98.3 F (36.8 C) 98.3 F (36.8 C) 97.6 F (36.4 C)  TempSrc: Oral Oral Oral Oral  SpO2: 96% 97% 95% 96%  Weight:      Height:         Body mass index is 42.84 kg/m.  Physical Exam   General: Awake alert HEENT: Normocephalic, atraumatic CVs: Regular rate rhythm Lungs: Clear Neurological exam Awake alert oriented x 3 Mildly dysarthric speech No aphasia Cranial nerves II to XII: Pupils equal round react light, extraocular movements intact, visual fields full, face symmetric grossly, facial sensation diminished on left, tongue and palate midline. Motor exam with no drift on outstretched arms or legs.  Left hemibody has 4+/5 strength in comparison to the right side that is 5/5. Sensation diminished on the left.  No extinction. Coordination grossly intact  Medications  Current Facility-Administered Medications:    acetaminophen  (TYLENOL ) tablet 650 mg, 650 mg, Oral, Q6H PRN, 650 mg at 07/24/23 0951 **OR** acetaminophen  (TYLENOL ) suppository 650 mg, 650 mg, Rectal, Q6H PRN, Davis, Subrina, MD   atorvastatin  (LIPITOR) tablet 20 mg, 20 mg, Oral, QHS, Davis, Subrina, MD, 20 mg at 07/23/23 2227   fluconazole (DIFLUCAN) tablet 150 mg, 150 mg, Oral, Once, Davis, Kshitiz, MD   furosemide  (LASIX ) tablet 40 mg, 40 mg, Oral, Daily, Davis, Subrina, MD, 40 mg at 07/24/23 9072   hydrALAZINE (APRESOLINE) injection 10 mg, 10 mg, Intravenous, Q6H PRN, Davis, Subrina, MD   levETIRAcetam  (KEPPRA ) undiluted injection 750 mg, 750 mg, Intravenous, BID, Davis, Kshitiz, MD, 750  mg at 07/24/23 9073   LORazepam  (ATIVAN ) injection 2 mg, 2 mg, Intravenous, Q2H PRN, Natasha, Kshitiz, MD, 2 mg at 07/23/23 1635   losartan (COZAAR) tablet 50 mg, 50 mg, Oral, Daily, Davis, Natasha Ned, MD, 50 mg at 07/24/23 0934   metoCLOPramide (REGLAN) injection 10 mg, 10 mg, Intravenous, Q6H PRN, Natasha, Kshitiz, MD, 10 mg at 07/23/23 1155   metoprolol  succinate (TOPROL -XL) 24 hr tablet 25 mg, 25 mg, Oral, Daily, Davis, Natasha Ned, MD, 25 mg at 07/24/23 0934   pantoprazole  (PROTONIX ) EC tablet 40 mg, 40 mg, Oral, Daily, Davis, Subrina, MD, 40 mg at 07/24/23 9072   sodium chloride  flush (NS) 0.9 % injection 3 mL, 3 mL, Intravenous, Q12H, Davis, Subrina, MD, 3 mL at 07/24/23 9073   sodium chloride  flush (NS) 0.9 % injection 3 mL, 3 mL, Intravenous, Q12H, Davis, Subrina, MD, 3 mL at 07/24/23 9073   sodium chloride  flush (NS) 0.9 % injection 3 mL, 3 mL, Intravenous, PRN, Davis, Subrina, MD   spironolactone  (ALDACTONE ) tablet 25 mg, 25 mg, Oral, Daily, Davis, Natasha Ned, MD, 25 mg at 07/24/23 0934   trimethobenzamide (TIGAN) injection 200 mg, 200 mg, Intramuscular, Q8H PRN, Davis, Subrina, MD, 200 mg at 07/23/23 0434  Labs and Diagnostic Imaging   CBC:  Recent Labs  Lab 07/22/23 2014 07/23/23 0427 07/24/23 0720  WBC 10.6* 13.8* 13.9*  NEUTROABS 9.2*  --  11.3*  HGB 12.5 11.4* 10.9*  HCT 40.4 36.7 34.0*  MCV 99.5 98.7 95.5  PLT 225  236 224    Basic Metabolic Panel:  Lab Results  Component Value Date   NA 131 (L) 07/24/2023   K 4.3 07/24/2023   CO2 24 07/24/2023   GLUCOSE 137 (H) 07/24/2023   BUN 15 07/24/2023   CREATININE 0.96 07/24/2023   CALCIUM  9.2 07/24/2023   GFRNONAA >60 07/24/2023   GFRAA 106 03/26/2016   Lipid Panel:  Lab Results  Component Value Date   LDLCALC 52 07/19/2023   HgbA1c:  Lab Results  Component Value Date   HGBA1C 5.7 (H) 07/19/2023   Urine Drug Screen:     Component Value Date/Time   LABOPIA NONE DETECTED 07/18/2023 1238    COCAINSCRNUR NONE DETECTED 07/18/2023 1238   LABBENZ NONE DETECTED 07/18/2023 1238   AMPHETMU NONE DETECTED 07/18/2023 1238   THCU NONE DETECTED 07/18/2023 1238   LABBARB NONE DETECTED 07/18/2023 1238    Alcohol Level     Component Value Date/Time   ETH <15 07/18/2023 1050   INR  Lab Results  Component Value Date   INR 1.8 (H) 07/24/2023   APTT  Lab Results  Component Value Date   APTT 37 (H) 07/23/2023   6/27 2135 CT Head without contrast(Personally reviewed): Acute bilateral subdural hematomas measuring 8 mm on the right and till meters on the left. Hemorrhage tracks along the falx and tentorial leaflets. Associated 5 mm of rightward midline shift.   6/28 0450- CT Head (Personally reviewed): 1. Stable bilateral Subdural Hematomas, generally hyperdense and 6-8 mm thickness bilaterally. 2. Stable mild intracranial mass effect with 4 mm of rightward midline shift. 3. No new intracranial abnormality. Chronic right MCA territory infarct.   6/28 0950 CT Head (Personally reviewed): Bilateral Subdural Hematoma with rightward midline shift of 3-4 mm.  Overnight continuous EEG-no evidence of seizures.  Continuous generalized slowing.  Assessment   Natasha Davis is a 68 y.o. female past history of right MCA stroke, residual mild left hemiparesis, sensory deficit on the left from the stroke, history of seizures on Keppra , who presented with breakthrough seizure a few days ago-also is on chronic anticoagulation with Coumadin  for mitral valve replacement, discharged home after she was treated in the hospital for the breakthrough seizure and a punctate focus of stroke was incidentally found. She returned back with headaches-found to have bilateral subdural hematomas-which have been stable on stability scans. Being evaluated by neurosurgery-plan for MMA embolization Neurological exam appears baseline today. Overnight EEG negative for seizures  Impression: New subdural hematomas,  lowering of seizure threshold probably by subdural hematomas in the setting of chronic right MCA stroke leading to breakthrough seizures.   Recommendations  Keppra  dose was increased from 500 mg twice daily to 750 mg twice daily.  Continue with Keppra  750 mg twice daily dosing. Discontinue LTM EEG Seizure precautions Management of subdural per neurosurgery Inpatient neurology will be available as needed Plan relayed to Dr. Cheryle ______________________________________________________________________   Signed, Eligio Lav, MD [facility]

## 2023-07-24 NOTE — Plan of Care (Signed)
 EEG is going to be removed today.  Patient still having headache from bleed but manageable with tylenol .  No neuro status changes over coarse of the day.

## 2023-07-24 NOTE — Care Management Obs Status (Signed)
 MEDICARE OBSERVATION STATUS NOTIFICATION   Patient Details  Name: MONSERRAT VIDAURRI MRN: [id number] Date of Birth: [birth year]   Medicare Observation Status Notification Given:  Yes    Margie Urbanowicz G., RN 07/24/2023, 9:10 AM

## 2023-07-25 DIAGNOSIS — S065XAA Traumatic subdural hemorrhage with loss of consciousness status unknown, initial encounter: Secondary | ICD-10-CM | POA: Diagnosis not present

## 2023-07-25 DIAGNOSIS — Z5181 Encounter for therapeutic drug level monitoring: Secondary | ICD-10-CM

## 2023-07-25 DIAGNOSIS — Z7901 Long term (current) use of anticoagulants: Secondary | ICD-10-CM

## 2023-07-25 DIAGNOSIS — Z952 Presence of prosthetic heart valve: Secondary | ICD-10-CM | POA: Diagnosis not present

## 2023-07-25 DIAGNOSIS — I482 Chronic atrial fibrillation, unspecified: Secondary | ICD-10-CM | POA: Diagnosis not present

## 2023-07-25 DIAGNOSIS — I6389 Other cerebral infarction: Secondary | ICD-10-CM

## 2023-07-25 LAB — CBC WITH DIFFERENTIAL/PLATELET
Abs Immature Granulocytes: 0.05 10*3/uL (ref 0.00–0.07)
Basophils Absolute: 0 10*3/uL (ref 0.0–0.1)
Basophils Relative: 0 %
Eosinophils Absolute: 0 10*3/uL (ref 0.0–0.5)
Eosinophils Relative: 0 %
HCT: 35.2 % — ABNORMAL LOW (ref 36.0–46.0)
Hemoglobin: 11.4 g/dL — ABNORMAL LOW (ref 12.0–15.0)
Immature Granulocytes: 0 %
Lymphocytes Relative: 8 %
Lymphs Abs: 1.1 10*3/uL (ref 0.7–4.0)
MCH: 30.6 pg (ref 26.0–34.0)
MCHC: 32.4 g/dL (ref 30.0–36.0)
MCV: 94.6 fL (ref 80.0–100.0)
Monocytes Absolute: 2 10*3/uL — ABNORMAL HIGH (ref 0.1–1.0)
Monocytes Relative: 15 %
Neutro Abs: 10.2 10*3/uL — ABNORMAL HIGH (ref 1.7–7.7)
Neutrophils Relative %: 77 %
Platelets: 247 10*3/uL (ref 150–400)
RBC: 3.72 MIL/uL — ABNORMAL LOW (ref 3.87–5.11)
RDW: 12.9 % (ref 11.5–15.5)
WBC: 13.4 10*3/uL — ABNORMAL HIGH (ref 4.0–10.5)
nRBC: 0 % (ref 0.0–0.2)

## 2023-07-25 LAB — COMPREHENSIVE METABOLIC PANEL WITH GFR
ALT: 75 U/L — ABNORMAL HIGH (ref 0–44)
AST: 69 U/L — ABNORMAL HIGH (ref 15–41)
Albumin: 3.6 g/dL (ref 3.5–5.0)
Alkaline Phosphatase: 92 U/L (ref 38–126)
Anion gap: 12 (ref 5–15)
BUN: 15 mg/dL (ref 8–23)
CO2: 24 mmol/L (ref 22–32)
Calcium: 9 mg/dL (ref 8.9–10.3)
Chloride: 92 mmol/L — ABNORMAL LOW (ref 98–111)
Creatinine, Ser: 0.86 mg/dL (ref 0.44–1.00)
GFR, Estimated: >60 mL/min (ref >60)
Glucose, Bld: 112 mg/dL — ABNORMAL HIGH (ref 70–99)
Potassium: 4.1 mmol/L (ref 3.5–5.1)
Sodium: 128 mmol/L — ABNORMAL LOW (ref 135–145)
Total Bilirubin: 1.3 mg/dL — ABNORMAL HIGH (ref 0.0–1.2)
Total Protein: 7.7 g/dL (ref 6.5–8.1)

## 2023-07-25 LAB — SURGICAL PCR SCREEN
MRSA, PCR: NEGATIVE
Staphylococcus aureus: NEGATIVE

## 2023-07-25 LAB — MAGNESIUM: Magnesium: 1.7 mg/dL (ref 1.7–2.4)

## 2023-07-25 MED ORDER — MELATONIN 5 MG PO TABS
5.0000 mg | ORAL_TABLET | Freq: Every day | ORAL | Status: DC
  Administered 2023-07-25 – 2023-08-07 (×14): 5 mg via ORAL
  Filled 2023-07-25 (×14): qty 1

## 2023-07-25 MED ORDER — FLUCONAZOLE 150 MG PO TABS
150.0000 mg | ORAL_TABLET | Freq: Once | ORAL | Status: AC
  Administered 2023-07-25: 150 mg via ORAL
  Filled 2023-07-25: qty 1

## 2023-07-25 NOTE — Progress Notes (Signed)
 Cardiology Progress Note  Patient ID: RAYELLE ARMOR MRN: [id number] DOB: 1955-06-16 Date of Encounter: 07/25/2023 Primary Cardiologist: Aleene Passe, MD -> [place]   Subjective   Chief Complaint: None.   HPI: Denies chest pain or trouble breathing.  ROS:  All other ROS reviewed and negative. Pertinent positives noted in the HPI.     Telemetry  Overnight telemetry shows A-fib heart rate 60s, which I personally reviewed.    Physical Exam   Vitals:   07/24/23 1400 07/24/23 2343 07/25/23 0343 07/25/23 0821  BP: 138/76 (!) 143/59 (!) 149/75 138/69  Pulse:  62 65 74  Resp:  16 20 18   Temp: 98.1 F (36.7 C) 99.3 F (37.4 C) 99.1 F (37.3 C) 98.7 F (37.1 C)  TempSrc: Oral Oral Oral Oral  SpO2: 96% 98% 96% 93%  Weight:      Height:        Intake/Output Summary (Last 24 hours) at 07/25/2023 0829 Last data filed at 07/24/2023 1550 Gross per 24 hour  Intake 480 ml  Output 300 ml  Net 180 ml       07/22/2023    7:35 PM 07/21/2023   10:41 AM 07/18/2023   10:31 AM  Last 3 Weights  Weight (lbs) 212 lb 1.3 oz 212 lb 212 lb 1.3 oz  Weight (kg) 96.2 kg 96.163 kg 96.2 kg    Body mass index is 42.84 kg/m.   Affect appropriate Healthy:  appears stated age HEENT: normal Neck supple with no adenopathy JVP normal no bruits no thyromegaly Lungs clear with no wheezing and good diaphragmatic motion Heart:  S1 click /S2 no murmur, no rub, gallop or click PMI normal Abdomen: benighn, BS positve, no tenderness, no AAA no bruit.  No HSM or HJR Distal pulses intact with no bruits No edema Neuro mild left sided weakness chronic  Skin warm and dry No muscular weakness   Cardiac Studies  TTE 07/19/2023  1. Left ventricular ejection fraction, by estimation, is 40 to 45%. The  left ventricle has mildly decreased function. The left ventricle  demonstrates global hypokinesis. There is moderate concentric left  ventricular hypertrophy. Left ventricular  diastolic parameters are  indeterminate.   2. Right ventricular systolic function is mildly reduced. The right  ventricular size is normal. There is normal pulmonary artery systolic  pressure.   3. Left atrial size was severely dilated.   4. The mitral valve has been repaired/replaced. No evidence of mitral  valve regurgitation. The mean mitral valve gradient is 4.3 mmHg.   5. The aortic valve is tricuspid. There is mild calcification of the  aortic valve. Aortic valve regurgitation is not visualized. Aortic valve  sclerosis is present, with no evidence of aortic valve stenosis.   6. The inferior vena cava is normal in size with <50% respiratory  variability, suggesting right atrial pressure of 8 mmHg.   Patient Profile  GWENDOLYNE WELFORD is a 68 y.o. female with mechanical mitral valve, persistent atrial fibrillation, systolic heart failure (EF 40-45%), stroke, hypertension admitted on 07/22/2023 with subdural hematomas.  Cardiology consulted for anticoagulation management in the setting of mechanical mitral valve.  Assessment & Plan   # Bilateral subdural hematomas # Mechanical mitral valve -  coumadin  held with subdural hematomas - MMA embolization tomorrow - ? Resume heparin /lovenox  and coumadin  post procedure - INR goal 2.5-3.5 - Mean gradient across MV 4.3 mmHg with normal function on TTE 07/19/23   # Recent L Parietal CVA - Admitted earlier last week  with acute stroke.  - INR sub Rx - at risk with mechanical valve and chronic afib off anticoagulation   # Chronic systolic heart failure, EF 40-45% - No signs of volume overload.  Continue Lasix  40 mg daily. - Stop metoprolol  to tartrate.  Transition to metoprolol  succinate 25 mg daily. - Stop triamterene  HCTZ.  Start losartan 50 mg daily.  Add Aldactone  25 mg daily.  No SGLT2 with obesity  # Persistent atrial fibrillation - Rate controlled on metoprolol .  Anticoagulation  held  Neuro please give guidance post procedure for starting heparin   /lovenox  And coumadin  post procedure     For questions or updates, please contact [facility] Please consult [email or web address] for contact info under    Maude Emmer MD [facility]

## 2023-07-25 NOTE — Progress Notes (Signed)
 PROGRESS NOTE    Natasha Davis  FMW:[id number] DOB: 21-Nov-1955 DOA: 07/22/2023 PCP: Marvine Rush, MD   Brief Narrative:  68 y.o. female with medical history significant of seizure disorder, acute CVA (required admission from 07/18/2023-07/20/2023 for breakthrough seizure and acute CVA requiring increase in dose of Keppra  by neurology), history of mitral valve replacement on Coumadin , GERD, hyperlipidemia, paroxysmal atrial fibrillation, HFrEF 40 to 45% and morbid obesity presented with worsening headache.  On presentation, CT head showed acute bilateral subdural hematoma with some shift.  Neurosurgery/Dr. Lanis was consulted: Recommended to hold Lovenox  and warfarin and recommended to repeat CT head in 8 to 10 hours.  Cardiology was also consulted for recommendations regarding anticoagulation given the fact that patient has a history of mechanical mitral valve.  Subsequently, patient had breakthrough seizures: Keppra  dose was increased.  Neurology was consulted; LTM EEG was negative for seizures.  LTM EEG discontinued.  Neurology signed off on 07/24/2023.  Assessment & Plan:   Subdural hematoma Hyperlipidemia Intractable headache History of recent acute incidental CVA: In the setting of subtherapeutic INR and the patient with mechanical mitral valve - Imaging as above.  CT head repeated as per neurosurgery recommendations showed stable bilateral subdural hematomas with some mass effect and midline shift.  Neurosurgery recommending to continue to hold Lovenox  and Coumadin  for now.  Patient might need bilateral MMA embolization probably tomorrow as per neurosurgery. - Monitor mental status.  Fall precautions. - Continue statin.   Breakthrough seizures in a patient with history of seizure disorder - Patient had few seizures in the hospital on 07/23/2023.  Neurology was consulted.  Keppra  dose has been increased to 750 mg IV twice daily.  LTM EEG was negative for seizures.  LTM EEG discontinued.   Neurology signed off on 07/24/2023. Seizure precaution.  Outpatient follow with neurology [facility]  Persistent A-fib Prolonged QT interval - Currently rate controlled.  Continue metoprolol .  Anticoagulation on hold as above.  Telemetry monitoring.  Avoid QT prolonging medications.  Cardiology following.  History of mechanical mitral valve replacement for mitral stenosis -Anticoagulation to remain on hold till neurosurgery clearance to resume it.  Cardiology following.  Chronic systolic heart failure Hypertension - Currently compensated.  Continue Lasix , metoprolol , losartan and spironolactone  as per cardiology.   Elevated LFTs--questionable cause.  Mild.  Monitor.  Hyponatremia - Monitor  Leukocytosis - Possibly reactive.  Monitor  Morbid obesity - Outpatient follow-up  DVT prophylaxis: SCDs Code Status: Full Family Communication: Son at bedside  Disposition Plan: Status is: Inpatient Remains inpatient appropriate because: Of severity of illness    Consultants: Neurosurgery/cardiology/neurology  Procedures: As above Antimicrobials: None   Subjective: Patient seen and examined at bedside.  Complains of mild headache.  No seizures, fevers, vomiting reported.  Objective: Vitals:   07/24/23 0737 07/24/23 1400 07/24/23 2343 07/25/23 0343  BP: (!) 149/73 138/76 (!) 143/59 (!) 149/75  Pulse: 70  62 65  Resp: 17  16 20   Temp: 97.6 F (36.4 C) 98.1 F (36.7 C) 99.3 F (37.4 C) 99.1 F (37.3 C)  TempSrc: Oral Oral Oral Oral  SpO2: 96% 96% 98% 96%  Weight:      Height:        Intake/Output Summary (Last 24 hours) at 07/25/2023 0738 Last data filed at 07/24/2023 1550 Gross per 24 hour  Intake 480 ml  Output 300 ml  Net 180 ml   Filed Weights   07/22/23 1935  Weight: 96.2 kg    Examination:  General: No acute distress.  Remains on room air.  Chronically ill and deconditioned looking.   ENT/neck: No obvious JVD elevation or palpable neck masses noted   respiratory: Bilateral decreased breath sounds at bases with scattered crackles CVS: Rate mostly controlled; S1 and S2 are heard Abdominal: Soft, morbidly obese, nontender, remains slightly distended; no organomegaly, bowel sounds are heard normally Extremities: No clubbing; mild lower extremity edema present CNS: Answers some questions.  Slow to respond.  No focal neurologic deficit.  Able to move extremities Lymph: No obvious palpable lymphadenopathy Skin: No obvious petechia/rashes psych: Not agitated.  Flat affect mostly.  Intermittently smiling. musculoskeletal: No obvious joint tenderness/erythema    Data Reviewed: I have personally reviewed following labs and imaging studies  CBC: Recent Labs  Lab 07/18/23 1047 07/18/23 1215 07/20/23 0433 07/22/23 2014 07/23/23 0427 07/24/23 0720 07/25/23 0447  WBC 8.0   < > 5.5 10.6* 13.8* 13.9* 13.4*  NEUTROABS 6.0  --   --  9.2*  --  11.3* 10.2*  HGB 12.1   < > 10.3* 12.5 11.4* 10.9* 11.4*  HCT 38.4   < > 32.7* 40.4 36.7 34.0* 35.2*  MCV 97.5   < > 97.0 99.5 98.7 95.5 94.6  PLT 228   < > 201 225 236 224 247   < > = values in this interval not displayed.   Basic Metabolic Panel: Recent Labs  Lab 07/19/23 0443 07/22/23 2014 07/23/23 0427 07/24/23 0720 07/25/23 0447  NA 135 135 138 131* 128*  K 3.8 5.0 4.7 4.3 4.1  CL 104 99 99 94* 92*  CO2 22 25 23 24 24   GLUCOSE 138* 135* 128* 137* 112*  BUN 12 11 12 15 15   CREATININE 0.97 0.93 0.86 0.96 0.86  CALCIUM  8.4* 10.0 9.7 9.2 9.0  MG  --   --   --  1.7 1.7   GFR: Estimated Creatinine Clearance: 62.8 mL/min (by C-G formula based on SCr of 0.86 mg/dL). Liver Function Tests: Recent Labs  Lab 07/18/23 1047 07/19/23 0443 07/23/23 0427 07/24/23 0720 07/25/23 0447  AST 41 35 83* 100* 69*  ALT 25 21 52* 76* 75*  ALKPHOS 102 85 97 93 92  BILITOT 1.0 1.1 1.2 1.4* 1.3*  PROT 7.7 6.1* 7.7 7.6 7.7  ALBUMIN 4.2 3.5 4.1 3.9 3.6   No results for input(s): LIPASE, AMYLASE in  the last 168 hours. No results for input(s): AMMONIA in the last 168 hours. Coagulation Profile: Recent Labs  Lab 07/20/23 0433 07/22/23 1201 07/22/23 2014 07/23/23 0427 07/24/23 0720  INR 2.1* 2.8 2.1* 1.8* 1.8*   Cardiac Enzymes: No results for input(s): CKTOTAL, CKMB, CKMBINDEX, TROPONINI in the last 168 hours. BNP (last 3 results) No results for input(s): PROBNP in the last 8760 hours. HbA1C: No results for input(s): HGBA1C in the last 72 hours. CBG: Recent Labs  Lab 07/23/23 0934  GLUCAP 128*   Lipid Profile: No results for input(s): CHOL, HDL, LDLCALC, TRIG, CHOLHDL, LDLDIRECT in the last 72 hours. Thyroid  Function Tests: No results for input(s): TSH, T4TOTAL, FREET4, T3FREE, THYROIDAB in the last 72 hours. Anemia Panel: No results for input(s): VITAMINB12, FOLATE, FERRITIN, TIBC, IRON, RETICCTPCT in the last 72 hours. Sepsis Labs: No results for input(s): PROCALCITON, LATICACIDVEN in the last 168 hours.  No results found for this or any previous visit (from the past 240 hours).       Radiology Studies: Overnight EEG with video Result Date: 07/24/2023 Shelton Arlin KIDD, MD     07/24/2023 11:29 AM Patient Name:  Natasha Davis MRN: [id number] Epilepsy Attending: Arlin MALVA Krebs Referring Physician/Provider: Remi Pippin, NP Duration: 07/23/2023 1740 to 07/24/2023 1120 Patient history: 68 y.o. female with mechanical mitral valve and acute bilateral SDH, currently non-operative.  EEG to evaluate for seizure Level of alertness: Awake, asleep AEDs during EEG study: LEV Technical aspects: This EEG study was done with scalp electrodes positioned according to the 10-20 International system of electrode placement. Electrical activity was reviewed with band pass filter of 1-70Hz , sensitivity of 7 uV/mm, display speed of 78mm/sec with a 60Hz  notched filter applied as appropriate. EEG data were recorded continuously and digitally  stored.  Video monitoring was available and reviewed as appropriate. Description: EEG initially showed continuous generalized 3 to 6 Hz theta-delta slowing admixed with 12-15hz  beta activity. Gradually after around 2145 on 07/23/2023, EEG improved and showed posterior dominant rhythm of 8-9 Hz activity of moderate voltage (25-35 uV) seen predominantly in posterior head regions, symmetric and reactive to eye opening and eye closing. Sleep was characterized by vertex waves, sleep spindles (12 to 14 Hz), maximal frontocentral region. Intermittent generalized 5-6hz  theta slowing was also noted. Hyperventilation and photic stimulation were not performed.   ABNORMALITY - Continuous slow, generalized IMPRESSION: This study was initially suggestive of moderate to severe diffuse encephalopathy which gradually improved to mild diffuse encephalopathy. No seizures or epileptiform discharges were seen throughout the recording. Priyanka MALVA Krebs   CT HEAD WO CONTRAST ( ) Result Date: 07/23/2023 CLINICAL DATA:  68 year old female with bilateral subdural hematoma. EXAM: CT HEAD WITHOUT CONTRAST TECHNIQUE: Contiguous axial images were obtained from the base of the skull through the vertex without intravenous contrast. RADIATION DOSE REDUCTION: This exam was performed according to the departmental dose-optimization program which includes automated exposure control, adjustment of the mA and/or kV according to patient size and/or use of iterative reconstruction technique. COMPARISON:  0458 hours today and earlier. FINDINGS: Brain: Bilateral mostly hyperdense but mixed density subdural hematoma is stable since 0458 hours today, generally 6 mm in thickness on the left and more lobulated on the right, generally 6-8 mm on that side. Rightward midline shift of 3-4 mm is stable on coronal image 46. Stable ventricle size and configuration. Stable gray-white matter differentiation throughout the brain. No new intracranial hemorrhage.  Chronic right hemisphere encephalomalacia. Basilar cisterns remain patent. Vascular: Calcified atherosclerosis at the skull base. Skull: Intact, stable. Sinuses/Orbits: Visualized paranasal sinuses and mastoids are stable and well aerated. Other: Visualized orbits and scalp soft tissues are within normal limits. IMPRESSION: Stable from 0458 hours today. Bilateral Subdural Hematoma with rightward midline shift of 3-4 mm. Electronically Signed   By: VEAR Hurst M.D.   On: 07/23/2023 10:11        Scheduled Meds:  atorvastatin   20 mg Oral QHS   fluconazole  150 mg Oral Once   furosemide   40 mg Oral Daily   levETIRAcetam   750 mg Intravenous BID   losartan  50 mg Oral Daily   metoprolol  succinate  25 mg Oral Daily   pantoprazole   40 mg Oral Daily   sodium chloride  flush  3 mL Intravenous Q12H   sodium chloride  flush  3 mL Intravenous Q12H   spironolactone   25 mg Oral Daily   Continuous Infusions:          Sophie Mao, MD [facility] 07/25/2023, 7:38 AM

## 2023-07-25 NOTE — Plan of Care (Signed)
°  Problem: Education: Goal: Knowledge of General Education information will improve Description: Including pain rating scale, medication(s)/side effects and non-pharmacologic comfort measures Outcome: Progressing   Problem: Health Behavior/Discharge Planning: Goal: Ability to manage health-related needs will improve Outcome: Progressing   Problem: Clinical Measurements: Goal: Will remain free from infection Outcome: Progressing Goal: Diagnostic test results will improve Outcome: Progressing Goal: Cardiovascular complication will be avoided Outcome: Progressing   Problem: Activity: Goal: Risk for activity intolerance will decrease Outcome: Progressing   Problem: Nutrition: Goal: Adequate nutrition will be maintained Outcome: Progressing

## 2023-07-25 NOTE — Plan of Care (Signed)
   Problem: Clinical Measurements: Goal: Ability to maintain clinical measurements within normal limits will improve Outcome: Progressing Goal: Diagnostic test results will improve Outcome: Progressing   Problem: Activity: Goal: Risk for activity intolerance will decrease Outcome: Progressing   Problem: Nutrition: Goal: Adequate nutrition will be maintained Outcome: Progressing

## 2023-07-25 NOTE — Progress Notes (Addendum)
  NEUROSURGERY PROGRESS NOTE   No issues overnight. Pt with unchanged HA, no new N/T/W. Didn't sleep much yesterday.  EXAM:  BP (!) 140/118 (BP Location: Left Wrist)   Pulse 73   Temp 98.4 F (36.9 C) (Oral)   Resp 16   Ht 4' 11 (1.499 m)   Wt 96.2 kg   SpO2 98%   BMI 42.84 kg/m   Awake, alert, oriented  Speech fluent, appropriate  CN grossly intact  5/5 BUE/BLE   IMPRESSION:  68 y.o. female with mechanical mitral valve and acute bilateral SDH, currently non-operative. Candidate for bilateral MMA embolization in order to minimize time off [facility] given high risk for embolic complications with valve.  PLAN: - Cont current supportive care - bilateral MMA embolization tomorrow. Can return to 3W post-procedure. Would plan on repeat CT head later in the week. If stable can consider restart AC/lovenox  this weekend.  I have reviewed the details of the hand and a procedure with the patient and her family at bedside.  We have discussed the expected postoperative course and recovery.  We reviewed the indications for the procedure, primarily in an attempt to minimize the time off anticoagulation while at the same time maximizing the chance of resolution without surgery.  We discussed the risks of the procedure to include risk of stroke, hemorrhage, arterial dissection, contrast nephropathy, and groin hematoma.  All their questions today were answered.  The patient and her family both indicated willingness to proceed with the procedure.  Gerldine Maizes, MD [facility]

## 2023-07-26 ENCOUNTER — Inpatient Hospital Stay (HOSPITAL_COMMUNITY)

## 2023-07-26 ENCOUNTER — Encounter

## 2023-07-26 ENCOUNTER — Encounter (HOSPITAL_COMMUNITY): Admitting: Anesthesiology

## 2023-07-26 ENCOUNTER — Encounter (HOSPITAL_COMMUNITY): Payer: Self-pay | Admitting: Internal Medicine

## 2023-07-26 ENCOUNTER — Encounter (HOSPITAL_COMMUNITY)
Admission: EM | Disposition: A | Discharge status: Home Health | Payer: Self-pay | Source: Home / Self Care | Attending: Internal Medicine

## 2023-07-26 DIAGNOSIS — Z87891 Personal history of nicotine dependence: Secondary | ICD-10-CM

## 2023-07-26 DIAGNOSIS — I6201 Nontraumatic acute subdural hemorrhage: Secondary | ICD-10-CM | POA: Diagnosis not present

## 2023-07-26 DIAGNOSIS — I509 Heart failure, unspecified: Secondary | ICD-10-CM | POA: Diagnosis not present

## 2023-07-26 DIAGNOSIS — S065X0A Traumatic subdural hemorrhage without loss of consciousness, initial encounter: Secondary | ICD-10-CM | POA: Diagnosis not present

## 2023-07-26 DIAGNOSIS — I110 Hypertensive heart disease with heart failure: Secondary | ICD-10-CM

## 2023-07-26 DIAGNOSIS — S065XAA Traumatic subdural hemorrhage with loss of consciousness status unknown, initial encounter: Secondary | ICD-10-CM | POA: Diagnosis not present

## 2023-07-26 HISTORY — PX: IR NEURO EACH ADD'L AFTER BASIC UNI RIGHT (MS): IMG5374

## 2023-07-26 HISTORY — PX: IR ANGIO INTRA EXTRACRAN SEL INTERNAL CAROTID BILAT MOD SED: IMG5363

## 2023-07-26 HISTORY — PX: IR TRANSCATH/EMBOLIZ: IMG695

## 2023-07-26 HISTORY — PX: IR ANGIO EXTERNAL CAROTID SEL EXT CAROTID BILAT MOD SED: IMG5372

## 2023-07-26 HISTORY — PX: IR NEURO EACH ADD'L AFTER BASIC UNI LEFT (MS): IMG5373

## 2023-07-26 HISTORY — PX: RADIOLOGY WITH ANESTHESIA: SHX6223

## 2023-07-26 LAB — CBC WITH DIFFERENTIAL/PLATELET
Abs Immature Granulocytes: 0.05 10*3/uL (ref 0.00–0.07)
Basophils Absolute: 0 10*3/uL (ref 0.0–0.1)
Basophils Relative: 0 %
Eosinophils Absolute: 0 10*3/uL (ref 0.0–0.5)
Eosinophils Relative: 0 %
HCT: 37 % (ref 36.0–46.0)
Hemoglobin: 12.4 g/dL (ref 12.0–15.0)
Immature Granulocytes: 0 %
Lymphocytes Relative: 9 %
Lymphs Abs: 1 10*3/uL (ref 0.7–4.0)
MCH: 31.2 pg (ref 26.0–34.0)
MCHC: 33.5 g/dL (ref 30.0–36.0)
MCV: 93 fL (ref 80.0–100.0)
Monocytes Absolute: 1.4 10*3/uL — ABNORMAL HIGH (ref 0.1–1.0)
Monocytes Relative: 12 %
Neutro Abs: 9.4 10*3/uL — ABNORMAL HIGH (ref 1.7–7.7)
Neutrophils Relative %: 79 %
Platelets: 285 10*3/uL (ref 150–400)
RBC: 3.98 MIL/uL (ref 3.87–5.11)
RDW: 12.8 % (ref 11.5–15.5)
WBC: 11.9 10*3/uL — ABNORMAL HIGH (ref 4.0–10.5)
nRBC: 0 % (ref 0.0–0.2)

## 2023-07-26 LAB — COMPREHENSIVE METABOLIC PANEL WITH GFR
ALT: 57 U/L — ABNORMAL HIGH (ref 0–44)
AST: 42 U/L — ABNORMAL HIGH (ref 15–41)
Albumin: 3.6 g/dL (ref 3.5–5.0)
Alkaline Phosphatase: 97 U/L (ref 38–126)
Anion gap: 10 (ref 5–15)
BUN: 17 mg/dL (ref 8–23)
CO2: 29 mmol/L (ref 22–32)
Calcium: 9.2 mg/dL (ref 8.9–10.3)
Chloride: 92 mmol/L — ABNORMAL LOW (ref 98–111)
Creatinine, Ser: 0.89 mg/dL (ref 0.44–1.00)
GFR, Estimated: >60 mL/min (ref >60)
Glucose, Bld: 118 mg/dL — ABNORMAL HIGH (ref 70–99)
Potassium: 3.9 mmol/L (ref 3.5–5.1)
Sodium: 131 mmol/L — ABNORMAL LOW (ref 135–145)
Total Bilirubin: 1.1 mg/dL (ref 0.0–1.2)
Total Protein: 8 g/dL (ref 6.5–8.1)

## 2023-07-26 LAB — GLUCOSE, CAPILLARY
Glucose-Capillary: 126 mg/dL — ABNORMAL HIGH (ref 70–99)
Glucose-Capillary: 152 mg/dL — ABNORMAL HIGH (ref 70–99)
Glucose-Capillary: 200 mg/dL — ABNORMAL HIGH (ref 70–99)

## 2023-07-26 LAB — TYPE AND SCREEN
ABO/RH(D): AB POS
Antibody Screen: NEGATIVE

## 2023-07-26 LAB — MAGNESIUM: Magnesium: 1.9 mg/dL (ref 1.7–2.4)

## 2023-07-26 SURGERY — RADIOLOGY WITH ANESTHESIA
Anesthesia: General

## 2023-07-26 MED ORDER — HEPARIN SODIUM (PORCINE) 1000 UNIT/ML IJ SOLN
INTRAMUSCULAR | Status: DC | PRN
Start: 2023-07-26 — End: 2023-07-26
  Administered 2023-07-26: 2000 [IU] via INTRAVENOUS

## 2023-07-26 MED ORDER — PROPOFOL 10 MG/ML IV BOLUS
INTRAVENOUS | Status: DC | PRN
  Administered 2023-07-26: 100 mg via INTRAVENOUS
  Administered 2023-07-26: 30 mg via INTRAVENOUS
  Administered 2023-07-26: 40 mg via INTRAVENOUS
  Administered 2023-07-26: 30 mg via INTRAVENOUS

## 2023-07-26 MED ORDER — HYDRALAZINE HCL 20 MG/ML IJ SOLN
INTRAMUSCULAR | Status: DC | PRN
  Administered 2023-07-26: 10 mg via INTRAVENOUS

## 2023-07-26 MED ORDER — ROCURONIUM BROMIDE 10 MG/ML (PF) SYRINGE
PREFILLED_SYRINGE | INTRAVENOUS | Status: DC | PRN
  Administered 2023-07-26: 70 mg via INTRAVENOUS

## 2023-07-26 MED ORDER — IOHEXOL 300 MG/ML  SOLN
150.0000 mL | Freq: Once | INTRAMUSCULAR | Status: AC | PRN
  Administered 2023-07-26: 75 mL via INTRA_ARTERIAL

## 2023-07-26 MED ORDER — ACETAMINOPHEN 10 MG/ML IV SOLN: 1000.0000 mg | Freq: Once | INTRAVENOUS | Status: DC | PRN

## 2023-07-26 MED ORDER — SUGAMMADEX SODIUM 200 MG/2ML IV SOLN
INTRAVENOUS | Status: DC | PRN
  Administered 2023-07-26: 200 mg via INTRAVENOUS

## 2023-07-26 MED ORDER — FENTANYL CITRATE (PF) 250 MCG/5ML IJ SOLN
INTRAMUSCULAR | Status: DC | PRN
  Administered 2023-07-26: 100 ug via INTRAVENOUS
  Administered 2023-07-26: 50 ug via INTRAVENOUS

## 2023-07-26 MED ORDER — PHENYLEPHRINE HCL-NACL 20-0.9 MG/250ML-% IV SOLN
INTRAVENOUS | Status: DC | PRN
  Administered 2023-07-26: 30 ug/min via INTRAVENOUS

## 2023-07-26 MED ORDER — FENTANYL CITRATE (PF) 100 MCG/2ML IJ SOLN: 25.0000 ug | INTRAMUSCULAR | Status: DC | PRN

## 2023-07-26 MED ORDER — CHLORHEXIDINE GLUCONATE 0.12 % MT SOLN
15.0000 mL | Freq: Once | OROMUCOSAL | Status: AC
  Administered 2023-07-26: 15 mL via OROMUCOSAL
  Filled 2023-07-26: qty 15

## 2023-07-26 MED ORDER — LIDOCAINE 2% (20 MG/ML) 5 ML SYRINGE
INTRAMUSCULAR | Status: DC | PRN
  Administered 2023-07-26: 100 mg via INTRAVENOUS

## 2023-07-26 MED ORDER — OXYCODONE HCL 5 MG PO TABS: 5.0000 mg | ORAL_TABLET | Freq: Once | ORAL | Status: DC | PRN

## 2023-07-26 MED ORDER — OXYCODONE HCL 5 MG/5ML PO SOLN: 5.0000 mg | Freq: Once | ORAL | Status: DC | PRN

## 2023-07-26 MED ORDER — ONDANSETRON HCL 4 MG/2ML IJ SOLN
INTRAMUSCULAR | Status: DC | PRN
  Administered 2023-07-26: 4 mg via INTRAVENOUS

## 2023-07-26 MED ORDER — SODIUM CHLORIDE 0.9 % IV SOLN: INTRAVENOUS | Status: DC

## 2023-07-26 MED ORDER — PHENYLEPHRINE 80 MCG/ML (10ML) SYRINGE FOR IV PUSH (FOR BLOOD PRESSURE SUPPORT)
PREFILLED_SYRINGE | INTRAVENOUS | Status: DC | PRN
  Administered 2023-07-26: 80 ug via INTRAVENOUS

## 2023-07-26 MED ORDER — FENTANYL CITRATE (PF) 250 MCG/5ML IJ SOLN
INTRAMUSCULAR | Status: AC
  Filled 2023-07-26: qty 5

## 2023-07-26 MED ORDER — DEXAMETHASONE SODIUM PHOSPHATE 10 MG/ML IJ SOLN
INTRAMUSCULAR | Status: DC | PRN
  Administered 2023-07-26: 10 mg via INTRAVENOUS

## 2023-07-26 MED ORDER — ORAL CARE MOUTH RINSE: 15.0000 mL | Freq: Once | OROMUCOSAL | Status: AC

## 2023-07-26 NOTE — Op Note (Signed)
 ENDOVASCULAR NEUROSURGERY OPERATIVE NOTE   PROCEDURE: Onyx embolization of right middle meningeal artery  Onyx embolization of left middle meningeal artery   HISTORY:   The patient is a 68 y.o. yo female presenting to the hospital with headache with a history of mechanical mitral valve on Coumadin  and Lovenox .  Her workup included CT scan demonstrating acute bilateral subdural hematomas.  Her Coumadin  was reversed.  While she did not require operative decompression, she is at high risk for stroke off anticoagulation given her mechanical valve.  We have therefore elected to proceed with middle meningeal artery embolization bilaterally in order to minimize time off anticoagulation and decrease chance of need for operative decompression.  Risks, benefits, and alternatives to the procedure were all reviewed in detail with the patient and her family.  After all questions were answered informed consent was obtained and witnessed.  APPROACH:   The technical aspects of the procedure as well as its potential risks and benefits were reviewed with the patient and family. These risks included but were not limited stroke leading to weakness, numbness, paralysis, coma, death, allergic reaction, damage to organs/vital structures, and hematoma formation. With an understanding of these risks, informed consent was obtained and witnessed.    The patient was placed in the supine position on the angiography table and the skin of right groin prepped in the usual sterile fashion. The procedure was performed under general anesthesia monitored by the anesthesia service.  Short 5Fr sheath was placed in the right common femoral artery using standard seldinger technique. Position of the sheath was documented with fluoro-phase images.    HEPARIN :  2000 Units total.    CONTRAST AGENT:  See IR records   FLUOROSCOPY TIME:  See IR records    CATHETER(S) AND WIRE(S):    5-French MPD guide catheter   0.035" glidewire    Apollo microcatheter x2 [place]ai 10 microwire  LIQUID EMBOLIC AGENT USED: Onyx 18  VESSELS CATHETERIZED:   Right internal carotid   Right external carotid   Right middle meningeal artery  Left internal carotid   Left external carotid   Left middle meningeal artery  Right common femoral  VESSELS STUDIED:   Right internal carotid, head Right external carotid, head Right middle meningeal artery, microcatheter run Right external carotid, post-embolization  Left internal carotid, head Left external carotid, head Left middle meningeal artery, microcatheter run Left external carotid, post-embolization  Right femoral  PROCEDURAL NARRATIVE:   The MPD guide catheter was introduced over the microwire.  The right internal carotid artery was selected.  Cerebral angiogram was taken.  The guide catheter was then positioned in the proximal right external carotid artery and angiogram again taken.  After review of the images I elected to proceed with the embolization.  Under roadmap guidance, the Apollo microcatheter was introduced over the microwire and the middle meningeal artery was selected.  Microcatheter run was then taken.  After review of the images again, we elected to proceed with the embolization.  The microwire was removed and the catheter was flushed with DMSO.  Under standard roadmap and blank roadmap technique, the middle meningeal artery was embolized with Onyx.  After completion of the embolization, the microcatheter was removed without incident.  Final control angiogram was taken through the guide catheter.  After review of the images, the guide catheter was withdrawn into the aortic arch.  At this point the left internal carotid artery was selected.  Cerebral angiogram was taken.  The guide catheter was then positioned in  the proximal left external carotid artery and angiogram again taken.  After review of the images I elected to proceed with the left-sided embolization.  Under  roadmap guidance, the Apollo microcatheter was introduced over the microwire and the middle meningeal artery was selected.  Microcatheter run was then taken.  The microwire was removed and the catheter was flushed with DMSO.  Under standard roadmap and blank roadmap technique, the middle meningeal artery was embolized with Onyx.  After completion of the embolization, the microcatheter was removed without incident.  Final control angiogram was taken through the guide catheter.    INTERPRETATION:   Right internal carotid, head:   Injection reveals the presence of a widely patent ICA, M1, and A1 segments and their branches. No aneurysms, arteriovenous malformations, or high flow fistulas are visualized.  The ophthalmic artery appears to have normal origin with visualized retinal/choroidal perfusion.  The parenchymal and venous phases are unremarkable, with mild mass effect upon the convexity from the known overlying chronic subdural hematoma. The venous sinuses are widely patent.    Right external carotid, head:   Visualized cranial branches of the external carotid artery are unremarkable.  Of note, there is normal origin of the middle meningeal artery.  No perfusion of the brain is noted.  There is no opacification of the intracranial cortical veins or dural venous sinuses.  Right middle meningeal artery: Microcatheter run taken in the middle meningeal artery reveals no perfusion of the underlying brain.    Right external carotid, post-embolization: The visualized branches of the external carotid artery remain unremarkable.  Onyx cast is seen within the intracranial middle meningeal artery, with occlusion of this vessel distal to its origin, proximal to its entrance into the skull base.  Left internal carotid, head:   Injection reveals the presence of a widely patent ICA, M1, and A1 segments and their branches. No aneurysms, arteriovenous malformations, or high flow fistulas are visualized.  The  ophthalmic artery appears to have normal origin with visualized retinal/choroidal perfusion.  The parenchymal and venous phases are unremarkable. The venous sinuses are widely patent.    Left external carotid, head:   Visualized cranial branches of the external carotid artery are unremarkable.  Of note, there is normal origin of the middle meningeal artery.  No perfusion of the brain is noted.  There is no opacification of the intracranial cortical veins or dural venous sinuses.  Left middle meningeal artery: Microcatheter run taken in the middle meningeal artery reveals no perfusion of the underlying brain.    Left external carotid, post-embolization: The visualized branches of the external carotid artery remain unremarkable.  Onyx cast is seen within the intracranial middle meningeal artery, with occlusion of this vessel at the level of foramen spinosum.  Right femoral:    Normal vessel. No significant atherosclerotic disease. Arterial sheath in adequate position at the right common femoral artery bifurcation.   DISPOSITION:  Upon completion of the study, the femoral sheath was removed and hemostasis obtained by manual compression. Good proximal and distal lower extremity pulses were documented upon achievement of hemostasis. The procedure was well tolerated and no early complications were observed.  The patient was transferred to the postanesthesia care unit to be positioned flat in bed for 3 hours.    IMPRESSION:  1. Successful Onyx embolization of bilateral middle meningeal arteries for subdural hematoma.    Gerldine Maizes, MD [facility]

## 2023-07-26 NOTE — Plan of Care (Signed)

## 2023-07-26 NOTE — Progress Notes (Signed)
 PROGRESS NOTE    LYN JOENS  FMW:[id number] DOB: [birth year] DOA: 07/22/2023 PCP: Marvine Rush, MD   Brief Narrative:  68 y.o. female with medical history significant of seizure disorder, acute CVA (required admission from 07/18/2023-07/20/2023 for breakthrough seizure and acute CVA requiring increase in dose of Keppra  by neurology), history of mitral valve replacement on Coumadin , GERD, hyperlipidemia, paroxysmal atrial fibrillation, HFrEF 40 to 45% and morbid obesity presented with worsening headache.  On presentation, CT head showed acute bilateral subdural hematoma with some shift.  Neurosurgery/Dr. Lanis was consulted: Recommended to hold Lovenox  and warfarin and recommended to repeat CT head in 8 to 10 hours.  Cardiology was also consulted for recommendations regarding anticoagulation given the fact that patient has a history of mechanical mitral valve.  Subsequently, patient had breakthrough seizures: Keppra  dose was increased.  Neurology was consulted; LTM EEG was negative for seizures.  LTM EEG discontinued.  Neurology signed off on 07/24/2023.  Assessment & Plan:   Subdural hematoma Hyperlipidemia Intractable headache History of recent acute incidental CVA: In the setting of subtherapeutic INR and the patient with mechanical mitral valve - Imaging as above.  CT head repeated as per neurosurgery recommendations showed stable bilateral subdural hematomas with some mass effect and midline shift.  Neurosurgery recommending to continue to hold Lovenox  and Coumadin  for now.  Neurosurgery planning for bilateral MMA embolization possibly today.  Keep n.p.o. -monitor mental status.  Fall precautions. - Continue statin.   Breakthrough seizures in a patient with history of seizure disorder - Patient had few seizures in the hospital on 07/23/2023.  Neurology was consulted.  Keppra  dose has been increased to 750 mg IV twice daily.  LTM EEG was negative for seizures.  LTM EEG discontinued.   Neurology signed off on 07/24/2023. Seizure precaution.  Outpatient follow with neurology   Persistent A-fib Prolonged QT interval - Currently rate controlled.  Continue metoprolol .  Anticoagulation on hold as above.  Telemetry monitoring.  Avoid QT prolonging medications.  Cardiology following.  History of mechanical mitral valve replacement for mitral stenosis -Anticoagulation to remain on hold till neurosurgery clearance to resume it.  Cardiology following.  Chronic systolic heart failure Hypertension - Currently compensated.  Continue Lasix , metoprolol , losartan and spironolactone  as per cardiology.   Elevated LFTs--questionable cause.  Mild.  Improving monitor.  Hyponatremia - Improving.  Monitor  Leukocytosis - Possibly reactive.  Improving.  Monitor  Morbid obesity - Outpatient follow-up  DVT prophylaxis: SCDs Code Status: Full Family Communication: Son and daughter-in-law at bedside  Disposition Plan: Status is: Inpatient Remains inpatient appropriate because: Of severity of illness    Consultants: Neurosurgery/cardiology/neurology  Procedures: As above Antimicrobials: None   Subjective: Patient seen and examined at bedside.  Still having intermittent mild headache.  Denies worsening fever, vomiting, abdominal. Objective: Vitals:   07/25/23 1921 07/25/23 2315 07/26/23 0312 07/26/23 0731  BP: 138/61 (!) 140/66 139/61 (!) 152/61  Pulse: (!) 58 66 76 78  Resp: 18 18 18 18   Temp: 97.6 F (36.4 C) 98.7 F (37.1 C) 99.7 F (37.6 C) 100.2 F (37.9 C)  TempSrc: Oral Oral Oral Oral  SpO2: 97% 97% 95% 96%  Weight:      Height:       No intake or output data in the 24 hours ending 07/26/23 0753  Filed Weights   07/22/23 1935  Weight: 96.2 kg    Examination:  General: On room air with no distress.  Chronically ill and deconditioned looking.   ENT/neck: No obvious  thyromegaly or JVD elevation noted respiratory: Decreased breath sounds at bases bilaterally  with some crackles CVS: S1-S2 heard; currently rate controlled  abdominal: Soft, morbidly obese, nontender, still slightly distended; no organomegaly, normal bowel sounds are heard Extremities: Mild lower extremity edema present; no cyanosis  CNS: Answers some questions appropriately.  Still slow to respond.  No obvious focal deficits noted  lymph: No obvious cervical lymphadenopathy Skin: No obvious lesions/ecchymosis psych: Currently flat affect with no signs of agitation  musculoskeletal: No obvious joint swelling/deformity    Data Reviewed: I have personally reviewed following labs and imaging studies  CBC: Recent Labs  Lab 07/22/23 2014 07/23/23 0427 07/24/23 0720 07/25/23 0447 07/26/23 0451  WBC 10.6* 13.8* 13.9* 13.4* 11.9*  NEUTROABS 9.2*  --  11.3* 10.2* 9.4*  HGB 12.5 11.4* 10.9* 11.4* 12.4  HCT 40.4 36.7 34.0* 35.2* 37.0  MCV 99.5 98.7 95.5 94.6 93.0  PLT 225 236 224 247 285   Basic Metabolic Panel: Recent Labs  Lab 07/22/23 2014 07/23/23 0427 07/24/23 0720 07/25/23 0447 07/26/23 0451  NA 135 138 131* 128* 131*  K 5.0 4.7 4.3 4.1 3.9  CL 99 99 94* 92* 92*  CO2 25 23 24 24 29   GLUCOSE 135* 128* 137* 112* 118*  BUN 11 12 15 15 17   CREATININE 0.93 0.86 0.96 0.86 0.89  CALCIUM  10.0 9.7 9.2 9.0 9.2  MG  --   --  1.7 1.7 1.9   GFR: Estimated Creatinine Clearance: 60.7 mL/min (by C-G formula based on SCr of 0.89 mg/dL). Liver Function Tests: Recent Labs  Lab 07/23/23 0427 07/24/23 0720 07/25/23 0447 07/26/23 0451  AST 83* 100* 69* 42*  ALT 52* 76* 75* 57*  ALKPHOS 97 93 92 97  BILITOT 1.2 1.4* 1.3* 1.1  PROT 7.7 7.6 7.7 8.0  ALBUMIN 4.1 3.9 3.6 3.6   No results for input(s): LIPASE, AMYLASE in the last 168 hours. No results for input(s): AMMONIA in the last 168 hours. Coagulation Profile: Recent Labs  Lab 07/20/23 0433 07/22/23 1201 07/22/23 2014 07/23/23 0427 07/24/23 0720  INR 2.1* 2.8 2.1* 1.8* 1.8*   Cardiac Enzymes: No results  for input(s): CKTOTAL, CKMB, CKMBINDEX, TROPONINI in the last 168 hours. BNP (last 3 results) No results for input(s): PROBNP in the last 8760 hours. HbA1C: No results for input(s): HGBA1C in the last 72 hours. CBG: Recent Labs  Lab 07/23/23 0934  GLUCAP 128*   Lipid Profile: No results for input(s): CHOL, HDL, LDLCALC, TRIG, CHOLHDL, LDLDIRECT in the last 72 hours. Thyroid  Function Tests: No results for input(s): TSH, T4TOTAL, FREET4, T3FREE, THYROIDAB in the last 72 hours. Anemia Panel: No results for input(s): VITAMINB12, FOLATE, FERRITIN, TIBC, IRON, RETICCTPCT in the last 72 hours. Sepsis Labs: No results for input(s): PROCALCITON, LATICACIDVEN in the last 168 hours.  Recent Results (from the past 240 hours)  Surgical pcr screen     Status: None   Collection Time: 07/25/23  4:14 PM   Specimen: Nasal Mucosa; Nasal Swab  Result Value Ref Range Status   MRSA, PCR NEGATIVE NEGATIVE Final   Staphylococcus aureus NEGATIVE NEGATIVE Final    Comment: (NOTE) The Xpert SA Assay (FDA approved for NASAL specimens in patients 49 years of age and older), is one component of a comprehensive surveillance program. It is not intended to diagnose infection nor to guide or monitor treatment. Performed at [facility], [address] [place], [place] [address]          Radiology  Studies: Overnight EEG with video Result Date: 07/24/2023 Shelton Arlin KIDD, MD     07/25/2023 10:35 AM Patient Name: Netasha W Dawood MRN: [id number] Epilepsy Attending: Arlin KIDD Shelton Referring Physician/Provider: Remi Pippin, NP Duration: 07/23/2023 1740 to 07/24/2023 1704 Patient history: 68 y.o. female with mechanical mitral valve and acute bilateral SDH, currently non-operative.  EEG to evaluate for seizure Level of alertness: Awake, asleep AEDs during EEG study: LEV Technical aspects: This EEG study was done with scalp electrodes positioned according  to the 10-20 International system of electrode placement. Electrical activity was reviewed with band pass filter of 1-70Hz , sensitivity of 7 uV/mm, display speed of 69mm/sec with a 60Hz  notched filter applied as appropriate. EEG data were recorded continuously and digitally stored.  Video monitoring was available and reviewed as appropriate. Description: EEG initially showed continuous generalized 3 to 6 Hz theta-delta slowing admixed with 12-15hz  beta activity. Gradually after around 2145 on 07/23/2023, EEG improved and showed posterior dominant rhythm of 8-9 Hz activity of moderate voltage (25-35 uV) seen predominantly in posterior head regions, symmetric and reactive to eye opening and eye closing. Sleep was characterized by vertex waves, sleep spindles (12 to 14 Hz), maximal frontocentral region. Intermittent generalized 5-6hz  theta slowing was also noted. Hyperventilation and photic stimulation were not performed.   ABNORMALITY - Continuous slow, generalized IMPRESSION: This study was initially suggestive of moderate to severe diffuse encephalopathy which gradually improved to mild diffuse encephalopathy. No seizures or epileptiform discharges were seen throughout the recording. Priyanka O Yadav        Scheduled Meds:  atorvastatin   20 mg Oral QHS   furosemide   40 mg Oral Daily   levETIRAcetam   750 mg Intravenous BID   losartan  50 mg Oral Daily   melatonin  5 mg Oral QHS   metoprolol  succinate  25 mg Oral Daily   pantoprazole   40 mg Oral Daily   sodium chloride  flush  3 mL Intravenous Q12H   sodium chloride  flush  3 mL Intravenous Q12H   spironolactone   25 mg Oral Daily   Continuous Infusions:          Sophie Mao, MD [facility] 07/26/2023, 7:53 AM

## 2023-07-26 NOTE — Anesthesia Preprocedure Evaluation (Signed)
 Anesthesia Evaluation  Patient identified by MRN, date of birth, ID band Patient awake    Reviewed: Allergy & Precautions, NPO status , Patient's Chart, lab work & pertinent test results, reviewed documented beta blocker date and time   Airway Mallampati: I  TM Distance: >3 FB Neck ROM: Full    Dental  (+) Edentulous Upper, Edentulous Lower   Pulmonary former smoker    + decreased breath sounds      Cardiovascular hypertension, Pt. on home beta blockers and Pt. on medications +CHF  + Valvular Problems/Murmurs  Rhythm:Regular Rate:Normal     Neuro/Psych  Headaches, Seizures -, Poorly Controlled,  CVA, Residual Symptoms  negative psych ROS   GI/Hepatic Neg liver ROS,GERD  Medicated,,  Endo/Other  negative endocrine ROS    Renal/GU negative Renal ROS     Musculoskeletal negative musculoskeletal ROS (+)    Abdominal   Peds  Hematology negative hematology ROS (+)   Anesthesia Other Findings   Reproductive/Obstetrics                             Anesthesia Physical Anesthesia Plan  ASA: 3  Anesthesia Plan: General   Post-op Pain Management: Tylenol  PO (pre-op)*   Induction: Intravenous  PONV Risk Score and Plan: 4 or greater and Ondansetron, Dexamethasone  and Treatment may vary due to age or medical condition  Airway Management Planned: Oral ETT  Additional Equipment: Arterial line  Intra-op Plan:   Post-operative Plan: Extubation in OR  Informed Consent: I have reviewed the patients History and Physical, chart, labs and discussed the procedure including the risks, benefits and alternatives for the proposed anesthesia with the patient or authorized representative who has indicated his/her understanding and acceptance.     Dental advisory given  Plan Discussed with: CRNA  Anesthesia Plan Comments:        Anesthesia Quick Evaluation

## 2023-07-26 NOTE — Plan of Care (Signed)
  Problem: Clinical Measurements: Goal: Respiratory complications will improve Outcome: Progressing   Problem: Activity: Goal: Risk for activity intolerance will decrease Outcome: Progressing  Patient arrived back to 3W on bedrest orders until 1845. Problem: Nutrition: Goal: Adequate nutrition will be maintained Outcome: Progressing

## 2023-07-26 NOTE — Anesthesia Procedure Notes (Signed)
 Procedure Name: Intubation Date/Time: 07/26/2023 1:14 PM  Performed by: Julien Manus, CRNAPre-anesthesia Checklist: Patient identified, Emergency Drugs available, Suction available and Patient being monitored Patient Re-evaluated:Patient Re-evaluated prior to induction Oxygen Delivery Method: Circle system utilized Preoxygenation: Pre-oxygenation with 100% oxygen Induction Type: IV induction Ventilation: Mask ventilation without difficulty and Oral airway inserted - appropriate to patient size Laryngoscope Size: Mac and 4 Grade View: Grade I Tube type: Oral Tube size: 7.0 mm Number of attempts: 1 Airway Equipment and Method: Stylet and Oral airway Placement Confirmation: ETT inserted through vocal cords under direct vision, positive ETCO2 and breath sounds checked- equal and bilateral Secured at: 22 cm Tube secured with: Tape Dental Injury: Teeth and Oropharynx as per pre-operative assessment

## 2023-07-26 NOTE — Transfer of Care (Signed)
 Immediate Anesthesia Transfer of Care Note  Patient: Natasha Davis  Procedure(s) Performed: RADIOLOGY WITH ANESTHESIA  Patient Location: PACU  Anesthesia Type:General  Level of Consciousness: awake and alert   Airway & Oxygen Therapy: Patient Spontanous Breathing and Patient connected to face mask oxygen  Post-op Assessment: Report given to RN and Post -op Vital signs reviewed and stable, moving all extremeties  Post vital signs: Reviewed and stable  Last Vitals:  Vitals Value Taken Time  BP 129/55 07/26/23 15:10  Temp    Pulse 83 07/26/23 15:15  Resp 20 07/26/23 15:15  SpO2 93 % 07/26/23 15:15  Vitals shown include unfiled device data.  Last Pain:  Vitals:   07/26/23 1302  TempSrc:   PainSc: 0-No pain      Patients Stated Pain Goal: 0 (07/25/23 0525)  Complications: No notable events documented.

## 2023-07-26 NOTE — Progress Notes (Signed)
 Patient request to hold off on administrating diuretics until off bedrest is over at 1900. RN verbalize understanding. MD made aware.

## 2023-07-26 NOTE — Progress Notes (Signed)
 Patient transported to OR via OR staff. CCMD notified. Family at bedside notified.

## 2023-07-26 NOTE — Progress Notes (Signed)
  NEUROSURGERY PROGRESS NOTE   No issues overnight. Pt without new complaint this am.  EXAM:  BP (P) 130/62   Pulse (P) 68   Temp (P) 98.6 F (37 C) (Oral)   Resp (P) 18   Ht (P) 4' 11 (1.499 m)   Wt (P) 96.2 kg   SpO2 (P) 98%   BMI (P) 42.84 kg/m   Awake, alert, oriented  Speech fluent, appropriate  CN grossly intact  5/5 BUE/BLE   IMPRESSION:  68 y.o. female with mechanical mitral valve and acute bilateral SDH, currently non-operative. Candidate for bilateral MMA embolization in order to minimize time off [facility] given high risk for embolic complications with valve.   PLAN: - will proceed with bilateral MMA embolization   I have reviewed the indications for the procedure as well as the details of the procedure and the expected postoperative course and recovery at length with the patient and her son and daughter. We have also reviewed in detail the risks, benefits, and alternatives to the procedure. All questions were answered and Natasha Davis provided informed consent to proceed.  Gerldine Maizes, MD [facility]

## 2023-07-27 ENCOUNTER — Encounter (HOSPITAL_COMMUNITY): Payer: Self-pay | Admitting: Neurosurgery

## 2023-07-27 DIAGNOSIS — I6389 Other cerebral infarction: Secondary | ICD-10-CM | POA: Diagnosis not present

## 2023-07-27 DIAGNOSIS — I480 Paroxysmal atrial fibrillation: Secondary | ICD-10-CM | POA: Diagnosis not present

## 2023-07-27 DIAGNOSIS — Z952 Presence of prosthetic heart valve: Secondary | ICD-10-CM | POA: Diagnosis not present

## 2023-07-27 DIAGNOSIS — S065XAA Traumatic subdural hemorrhage with loss of consciousness status unknown, initial encounter: Secondary | ICD-10-CM | POA: Diagnosis not present

## 2023-07-27 DIAGNOSIS — Z5181 Encounter for therapeutic drug level monitoring: Secondary | ICD-10-CM | POA: Diagnosis not present

## 2023-07-27 LAB — COMPREHENSIVE METABOLIC PANEL WITH GFR
ALT: 50 U/L — ABNORMAL HIGH (ref 0–44)
AST: 42 U/L — ABNORMAL HIGH (ref 15–41)
Albumin: 3 g/dL — ABNORMAL LOW (ref 3.5–5.0)
Alkaline Phosphatase: 106 U/L (ref 38–126)
Anion gap: 10 (ref 5–15)
BUN: 21 mg/dL (ref 8–23)
CO2: 22 mmol/L (ref 22–32)
Calcium: 8.1 mg/dL — ABNORMAL LOW (ref 8.9–10.3)
Chloride: 96 mmol/L — ABNORMAL LOW (ref 98–111)
Creatinine, Ser: 0.92 mg/dL (ref 0.44–1.00)
GFR, Estimated: >60 mL/min (ref >60)
Glucose, Bld: 138 mg/dL — ABNORMAL HIGH (ref 70–99)
Potassium: 4.5 mmol/L (ref 3.5–5.1)
Sodium: 128 mmol/L — ABNORMAL LOW (ref 135–145)
Total Bilirubin: 1 mg/dL (ref 0.0–1.2)
Total Protein: 7 g/dL (ref 6.5–8.1)

## 2023-07-27 LAB — CBC WITH DIFFERENTIAL/PLATELET
Abs Immature Granulocytes: 0.04 10*3/uL (ref 0.00–0.07)
Basophils Absolute: 0 10*3/uL (ref 0.0–0.1)
Basophils Relative: 0 %
Eosinophils Absolute: 0 10*3/uL (ref 0.0–0.5)
Eosinophils Relative: 0 %
HCT: 32.7 % — ABNORMAL LOW (ref 36.0–46.0)
Hemoglobin: 10.7 g/dL — ABNORMAL LOW (ref 12.0–15.0)
Immature Granulocytes: 0 %
Lymphocytes Relative: 5 %
Lymphs Abs: 0.5 10*3/uL — ABNORMAL LOW (ref 0.7–4.0)
MCH: 30.7 pg (ref 26.0–34.0)
MCHC: 32.7 g/dL (ref 30.0–36.0)
MCV: 94 fL (ref 80.0–100.0)
Monocytes Absolute: 0.8 10*3/uL (ref 0.1–1.0)
Monocytes Relative: 7 %
Neutro Abs: 9.9 10*3/uL — ABNORMAL HIGH (ref 1.7–7.7)
Neutrophils Relative %: 88 %
Platelets: 260 10*3/uL (ref 150–400)
RBC: 3.48 MIL/uL — ABNORMAL LOW (ref 3.87–5.11)
RDW: 12.7 % (ref 11.5–15.5)
WBC: 11.2 10*3/uL — ABNORMAL HIGH (ref 4.0–10.5)
nRBC: 0 % (ref 0.0–0.2)

## 2023-07-27 LAB — MAGNESIUM: Magnesium: 2 mg/dL (ref 1.7–2.4)

## 2023-07-27 LAB — GLUCOSE, CAPILLARY: Glucose-Capillary: 184 mg/dL — ABNORMAL HIGH (ref 70–99)

## 2023-07-27 MED ORDER — SENNOSIDES-DOCUSATE SODIUM 8.6-50 MG PO TABS
1.0000 | ORAL_TABLET | Freq: Two times a day (BID) | ORAL | Status: DC | PRN
  Administered 2023-07-27 – 2023-08-03 (×4): 1 via ORAL
  Filled 2023-07-27 (×4): qty 1

## 2023-07-27 MED ORDER — POLYETHYLENE GLYCOL 3350 17 G PO PACK
17.0000 g | PACK | Freq: Every day | ORAL | Status: DC | PRN
  Administered 2023-07-28 – 2023-08-03 (×4): 17 g via ORAL
  Filled 2023-07-27 (×4): qty 1

## 2023-07-27 NOTE — TOC Initial Note (Signed)
 Transition of Care [facility]) - Initial/Assessment Note    Patient Details  Name: Natasha Davis MRN: [id number] Date of Birth: 1955-04-12  Transition of Care [facility]) CM/SW Contact:    Andrez JULIANNA George, RN Phone Number: 07/27/2023, 11:51 AM  Clinical Narrative:                  Pt readmitted for: Onyx embolization of right middle meningeal artery and Onyx embolization of left middle meningeal artery  She is from home with her grandson. Someone is with her most of the time.  Daughters provide needed transportation.  Pt manages her own medications.  TOC following for d/c needs.   Expected Discharge Plan: Home/Self Care Barriers to Discharge: Continued Medical Work up   Patient Goals and CMS Choice            Expected Discharge Plan and Services   Discharge Planning Services: CM Consult   Living arrangements for the past 2 months: Single Family Home                                      Prior Living Arrangements/Services Living arrangements for the past 2 months: Single Family Home Lives with:: Relatives (grandson) Patient language and need for interpreter reviewed:: Yes Do you feel safe going back to the place where you live?: Yes        Care giver support system in place?: Yes (comment) Current home services: DME ([facility]) Criminal Activity/Legal Involvement Pertinent to Current Situation/Hospitalization: No - Comment as needed  Activities of Daily Living      Permission Sought/Granted                  Emotional Assessment Appearance:: Appears stated age Attitude/Demeanor/Rapport: Engaged Affect (typically observed): Accepting Orientation: : Oriented to Self, Oriented to Place, Oriented to  Time, Oriented to Situation   Psych Involvement: No (comment)  Admission diagnosis:  Subdural hematoma (HCC) [S06.5XAA] Chronic anticoagulation [Z79.01] Status post heart valve replacement with mechanical valve [Z95.2] Bilateral subdural hematomas (HCC)  [S06.5XAA] Patient Active Problem List   Diagnosis Date Noted   Intractable headache 07/22/2023   PAF (paroxysmal atrial fibrillation) (HCC) 07/22/2023   Combined systolic and diastolic congestive heart failure (HCC) 07/22/2023   Stroke (cerebrum) (HCC) 07/19/2023   Subdural hematoma (HCC) 07/18/2023   Seizure (HCC) 07/18/2023   Seizure disorder (HCC) 07/18/2023   History of CVA (cerebrovascular accident) 07/18/2023   Acute CVA (cerebrovascular accident) (HCC) 07/18/2023   Persistent atrial fibrillation (HCC) 03/21/2023   Hypercoagulable state due to persistent atrial fibrillation (HCC) 03/21/2023   GERD (gastroesophageal reflux disease) 08/16/2022   Essential hypertension 06/20/2015   Anticoagulation management encounter 02/23/2013   S/P MVR (mitral valve replacement) 07/17/2010   H/O mitral valve replacement with mechanical valve 04/22/2010   PCP:  Marvine Rush, MD Pharmacy:   [id number] - [address] [place] - [address] [place] [address] Phone: [phone] Fax: [phone]  [facility] - [place], [place] - [address] [place] [address] Phone: [phone] Fax: [phone]  [facility] [address] [place] [address] Phone: [phone] Fax: [phone]     Social Drivers of Health (SDOH) Social History: SDOH Screenings   Food Insecurity: No Food Insecurity (07/23/2023)  Housing: Low Risk  (07/23/2023)  Transportation  Needs: No Transportation Needs (07/23/2023)  Utilities: Not At Risk (07/23/2023)  Social Connections: Moderately Integrated (07/23/2023)  Tobacco Use: Medium Risk (07/26/2023)   SDOH Interventions:     Readmission Risk Interventions     No data to display

## 2023-07-27 NOTE — Progress Notes (Signed)
 Progress Note    Natasha Davis   FMW:[id number]  DOB: 13-Apr-1955  DOA: 07/22/2023     5 PCP: Marvine Rush, MD  Initial CC: Headache  Hospital Course: 68 y.o. female with medical history significant of seizure disorder, acute CVA (required admission from 07/18/23-07/20/23 for breakthrough seizure and acute CVA requiring increase in dose of Keppra  by neurology), history of mitral valve replacement on Coumadin , GERD, hyperlipidemia, paroxysmal atrial fibrillation, HFrEF 40 to 45% and morbid obesity presented with worsening headache.   On presentation, CT head showed acute bilateral subdural hematoma. Neurosurgery was consulted and recommended to hold Lovenox  and warfarin and recommended to repeat CT head in 8 to 10 hours.   Cardiology was also consulted. Subsequently, patient had breakthrough seizures: Keppra  dose was increased.  Neurology was consulted; LTM EEG was negative for seizures.  LTM EEG discontinued.  Neurology signed off on 07/24/2023.   Assessment & Plan:   Subdural hematoma Intractable headache - bilateral SDH noted on CT head - Evaluated by neurosurgery and ultimately underwent embolization of right and left middle meningeal artery on 07/26/2023 in efforts to alleviate time off of anticoagulation - Follow-up further neurosurgery recommendations on anticoag resumption; tentatively undergoing repeat CT head tomorrow followed by anticoagulation resumption either Thus or Fri  History of recent acute incidental CVA - In the setting of subtherapeutic INR and the patient with mechanical mitral valve -MRI brain from 07/18/2023 noted acute/subacute cortical infarct involving left parietal lobe; chronic left thalamic infarct also noted - resuming coumadin  as able - Continue statin.    Breakthrough seizure Hx seizure disorder - breakthru seizures in the hospital on 07/23/2023 - Neurology was consulted.  Keppra  dose has been increased to 750 mg BID. LTM EEG was negative for seizures.   LTM EEG discontinued - Neurology signed off on 07/24/2023. Seizure precaution.  Outpatient follow with neurology    Persistent A-fib Prolonged QT interval - Currently rate controlled.  Continue metoprolol .  Anticoagulation on hold as above.  Telemetry monitoring.  Avoid QT prolonging medications.  Cardiology following  HLD - continue statin  Normocytic anemia -Prior baseline around 11 to 12 g/dL -Mild drop from baseline, possibly in setting of underlying SDH -Continue trending for now   Hx Mechanical MVR - for hx mitral stenosis -Anticoagulation to remain on hold till neurosurgery clearance to resume it.  Cardiology aware/following   Chronic combined systolic and diastolic CHF Hypertension - Currently compensated - Continue Lasix , metoprolol , losartan and spironolactone  as per cardiology.    Elevated LFTs --questionable cause.  Mild.  Improving monitor.   Hyponatremia - stable - trend   Leukocytosis - Possibly reactive.  Improving.  Monitor   Morbid obesity - Complicates overall prognosis and care - Body mass index is 42.84 kg/m (pended).  Interval History:  Headache improved since admission she says.  Otherwise seems to be overall doing okay.  Son present bedside this morning.  They were complaining of some constipation and asking for laxatives.   Old records reviewed in assessment of this patient  Antimicrobials:   DVT prophylaxis:  SCDs Start: 07/22/23 2301 Place TED hose Start: 07/22/23 2301   Code Status:   Code Status: Full Code  Mobility Assessment (Last 72 Hours)     Mobility Assessment     Row Name 07/27/23 1600 07/27/23 0945 07/26/23 2100 07/26/23 0807 07/25/23 2039   Does patient have an order for bedrest or is patient medically unstable -- No - Continue assessment No - Continue assessment No - Continue assessment  No - Continue assessment   What is the highest level of mobility based on the progressive mobility assessment? Level 5 (Walks with  assist in room/hall) - Balance while stepping forward/back and can walk in room with assist - Complete Level 5 (Walks with assist in room/hall) - Balance while stepping forward/back and can walk in room with assist - Complete Level 5 (Walks with assist in room/hall) - Balance while stepping forward/back and can walk in room with assist - Complete Level 5 (Walks with assist in room/hall) - Balance while stepping forward/back and can walk in room with assist - Complete Level 5 (Walks with assist in room/hall) - Balance while stepping forward/back and can walk in room with assist - Complete   Is the above level different from baseline mobility prior to current illness? -- Yes - Recommend PT order Yes - Recommend PT order Yes - Recommend PT order Yes - Recommend PT order    Row Name 07/25/23 0941 07/24/23 2000         Does patient have an order for bedrest or is patient medically unstable No - Continue assessment Yes- Bedfast (Level 1) - Complete      What is the highest level of mobility based on the progressive mobility assessment? Level 5 (Walks with assist in room/hall) - Balance while stepping forward/back and can walk in room with assist - Complete --      Is the above level different from baseline mobility prior to current illness? Yes - Recommend PT order --         Barriers to discharge: None Disposition Plan: None HH orders placed: TBD Status is: Inpatient  Objective: Blood pressure 126/60, pulse 65, temperature 98.3 F (36.8 C), temperature source Oral, resp. rate 18, height (P) 4' 11 (1.499 m), weight (P) 96.2 kg, SpO2 99%.  Examination:  Physical Exam Constitutional:      Appearance: Normal appearance.  HENT:     Head: Normocephalic and atraumatic.     Mouth/Throat:     Mouth: Mucous membranes are moist.  Eyes:     Extraocular Movements: Extraocular movements intact.  Cardiovascular:     Rate and Rhythm: Normal rate and regular rhythm.  Pulmonary:     Effort: Pulmonary  effort is normal. No respiratory distress.     Breath sounds: Normal breath sounds. No wheezing.  Abdominal:     General: Bowel sounds are normal. There is no distension.     Palpations: Abdomen is soft.     Tenderness: There is no abdominal tenderness.  Musculoskeletal:        General: Normal range of motion.     Cervical back: Normal range of motion and neck supple.  Skin:    General: Skin is warm and dry.  Neurological:     Mental Status: She is alert.     Comments:  4/5 strength in LUE/LLE and paresthesias in both left upper and lower extremities per patient  Psychiatric:        Mood and Affect: Mood normal.      Consultants:  Neurosurgery  Procedures:  07/26/2023 PROCEDURE: Onyx embolization of right middle meningeal artery  Onyx embolization of left middle meningeal artery   Data Reviewed: Results for orders placed or performed during the hospital encounter of 07/22/23 (from the past 24 hours)  Glucose, capillary     Status: Abnormal   Collection Time: 07/26/23  4:54 PM  Result Value Ref Range   Glucose-Capillary 152 (H) 70 - 99 mg/dL  Comment 1 Notify RN    Comment 2 Document in Chart   Glucose, capillary     Status: Abnormal   Collection Time: 07/26/23  8:28 PM  Result Value Ref Range   Glucose-Capillary 200 (H) 70 - 99 mg/dL  Glucose, capillary     Status: Abnormal   Collection Time: 07/27/23  1:05 AM  Result Value Ref Range   Glucose-Capillary 184 (H) 70 - 99 mg/dL  CBC with Differential/Platelet     Status: Abnormal   Collection Time: 07/27/23  4:42 AM  Result Value Ref Range   WBC 11.2 (H) 4.0 - 10.5 K/uL   RBC 3.48 (L) 3.87 - 5.11 MIL/uL   Hemoglobin 10.7 (L) 12.0 - 15.0 g/dL   HCT 67.2 (L) 63.9 - 53.9 %   MCV 94.0 80.0 - 100.0 fL   MCH 30.7 26.0 - 34.0 pg   MCHC 32.7 30.0 - 36.0 g/dL   RDW 87.2 88.4 - 84.4 %   Platelets 260 150 - 400 K/uL   nRBC 0.0 0.0 - 0.2 %   Neutrophils Relative % 88 %   Neutro Abs 9.9 (H) 1.7 - 7.7 K/uL   Lymphocytes  Relative 5 %   Lymphs Abs 0.5 (L) 0.7 - 4.0 K/uL   Monocytes Relative 7 %   Monocytes Absolute 0.8 0.1 - 1.0 K/uL   Eosinophils Relative 0 %   Eosinophils Absolute 0.0 0.0 - 0.5 K/uL   Basophils Relative 0 %   Basophils Absolute 0.0 0.0 - 0.1 K/uL   Immature Granulocytes 0 %   Abs Immature Granulocytes 0.04 0.00 - 0.07 K/uL  Comprehensive metabolic panel with GFR     Status: Abnormal   Collection Time: 07/27/23  4:42 AM  Result Value Ref Range   Sodium 128 (L) 135 - 145 mmol/L   Potassium 4.5 3.5 - 5.1 mmol/L   Chloride 96 (L) 98 - 111 mmol/L   CO2 22 22 - 32 mmol/L   Glucose, Bld 138 (H) 70 - 99 mg/dL   BUN 21 8 - 23 mg/dL   Creatinine, Ser 9.07 0.44 - 1.00 mg/dL   Calcium  8.1 (L) 8.9 - 10.3 mg/dL   Total Protein 7.0 6.5 - 8.1 g/dL   Albumin 3.0 (L) 3.5 - 5.0 g/dL   AST 42 (H) 15 - 41 U/L   ALT 50 (H) 0 - 44 U/L   Alkaline Phosphatase 106 38 - 126 U/L   Total Bilirubin 1.0 0.0 - 1.2 mg/dL   GFR, Estimated >39 >39 mL/min   Anion gap 10 5 - 15  Magnesium     Status: None   Collection Time: 07/27/23  4:42 AM  Result Value Ref Range   Magnesium 2.0 1.7 - 2.4 mg/dL    I have reviewed pertinent nursing notes, vitals, labs, and images as necessary. I have ordered labwork to follow up on as indicated.  I have reviewed the last notes from staff over past 24 hours. I have discussed patient's care plan and test results with nursing staff, CM/SW, and other staff as appropriate.  Time spent: Greater than 50% of the 55 minute visit was spent in counseling/coordination of care for the patient as laid out in the A&P.   LOS: 5 days   Alm Apo, MD [facility] 07/27/2023, 4:39 PM

## 2023-07-27 NOTE — Progress Notes (Signed)
 Cardiology Progress Note  Patient ID: Natasha Davis MRN: [id number] DOB: April 04, 1955 Date of Encounter: 07/27/2023 Primary Cardiologist: Aleene Passe, MD -> [place]   Subjective   Chief Complaint: None.   HPI: Denies chest pain or trouble breathing. Procedure went well yesterday  ROS:  All other ROS reviewed and negative. Pertinent positives noted in the HPI.     Telemetry  Overnight telemetry shows A-fib heart rate 60s, which I personally reviewed.    Physical Exam   Vitals:   07/26/23 2347 07/27/23 0433 07/27/23 0757 07/27/23 0945  BP: (!) 134/55 (!) 113/57 (!) 121/56 (!) 130/54  Pulse: 74 (!) 58 64 68  Resp: 17 18 20 20   Temp: 99.3 F (37.4 C) 98.7 F (37.1 C) 98.5 F (36.9 C)   TempSrc: Oral Oral Oral   SpO2: 97% 97% 97% 97%  Weight:      Height:        Intake/Output Summary (Last 24 hours) at 07/27/2023 0949 Last data filed at 07/26/2023 1800 Gross per 24 hour  Intake 87.83 ml  Output 50 ml  Net 37.83 ml       07/26/2023   11:07 AM 07/22/2023    7:35 PM 07/21/2023   10:41 AM  Last 3 Weights  Weight (lbs) 212 lb 1.3 oz 212 lb 1.3 oz 212 lb  Weight (kg) 96.2 kg 96.2 kg 96.163 kg    Body mass index is 42.84 kg/m (pended).   Affect appropriate Healthy:  appears stated age HEENT: normal Neck supple with no adenopathy JVP normal no bruits no thyromegaly Lungs clear with no wheezing and good diaphragmatic motion Heart:  S1 click /S2 no murmur, no rub, gallop or click PMI normal Abdomen: benighn, BS positve, no tenderness, no AAA no bruit.  No HSM or HJR Distal pulses intact with no bruits No edema Neuro mild left sided weakness chronic  Skin warm and dry No muscular weakness   Cardiac Studies  TTE 07/19/2023  1. Left ventricular ejection fraction, by estimation, is 40 to 45%. The  left ventricle has mildly decreased function. The left ventricle  demonstrates global hypokinesis. There is moderate concentric left  ventricular hypertrophy. Left ventricular   diastolic parameters are indeterminate.   2. Right ventricular systolic function is mildly reduced. The right  ventricular size is normal. There is normal pulmonary artery systolic  pressure.   3. Left atrial size was severely dilated.   4. The mitral valve has been repaired/replaced. No evidence of mitral  valve regurgitation. The mean mitral valve gradient is 4.3 mmHg.   5. The aortic valve is tricuspid. There is mild calcification of the  aortic valve. Aortic valve regurgitation is not visualized. Aortic valve  sclerosis is present, with no evidence of aortic valve stenosis.   6. The inferior vena cava is normal in size with <50% respiratory  variability, suggesting right atrial pressure of 8 mmHg.   Patient Profile  Natasha Davis is a 68 y.o. female with mechanical mitral valve, persistent atrial fibrillation, systolic heart failure (EF 40-45%), stroke, hypertension admitted on 07/22/2023 with subdural hematomas.  Cardiology consulted for anticoagulation management in the setting of mechanical mitral valve.  Assessment & Plan   # Bilateral subdural hematomas # Mechanical mitral valve -  coumadin  held with subdural hematomas - MMA embolization 07/26/23 went well  - ? Resume heparin /lovenox  and coumadin  post procedure - INR goal 2.5-3.5 - Mean gradient across MV 4.3 mmHg with normal function on TTE 07/19/23 - Her last Rx  INR was 07/22/23 Have left chat message for neurosurgery/primary to make sure heparin /coumadin  started ASAP ? If post procedure CT is stable    # Recent L Parietal CVA - Admitted earlier last week with acute stroke.  - INR sub Rx - at risk with mechanical valve and chronic afib off anticoagulation   # Chronic systolic heart failure, EF 40-45% - No signs of volume overload.  Continue Lasix  40 mg daily. - Stop metoprolol  to tartrate.  Transition to metoprolol  succinate 25 mg daily. - Stop triamterene  HCTZ.  Start losartan 50 mg daily.  Add Aldactone  25 mg daily.   No SGLT2 with obesity  # Persistent atrial fibrillation - Rate controlled on metoprolol .  Anticoagulation  held  Neuro please give guidance post procedure for starting heparin  /lovenox  And coumadin  post procedure     For questions or updates, please contact [facility] Please consult [email or web address] for contact info under    Maude Emmer MD [facility]

## 2023-07-27 NOTE — Evaluation (Signed)
 Physical Therapy Evaluation Patient Details Name: Natasha Davis MRN: [id number] DOB: 1955/05/28 Today's Date: 07/27/2023  History of Present Illness  Pt is a 68 y.o. F who presents 07/22/2023 with worsening headache. On presentation, CT head showed acute bilateral subdural hematoma with some shift. S/p bilateral MMA embolization 7/1. PMH includes: CVA (admission 6/23-6/25/2025 for breakthrough seizure and acute CVA), HTN, seizure.  Clinical Impression  Pt admitted with above and s/p bilateral MMA embolization 7/1. She typically is independent with ambulation without an AD. Pt with decline in functional status since evaluated on 6/24. On evaluation, pt benefits from external support of RW for increased postural stability. Ambulating 80 ft with CGA, demonstrating slow pace. Would benefit from follow up HHPT to address balance, strengthening and endurance.      If plan is discharge home, recommend the following: A little help with walking and/or transfers;A little help with bathing/dressing/bathroom;Assistance with cooking/housework;Help with stairs or ramp for entrance;Assist for transportation;Direct supervision/assist for financial management;Direct supervision/assist for medications management   Can travel by private vehicle        Equipment Recommendations Rolling walker (2 wheels)  Recommendations for Other Services       Functional Status Assessment Patient has had a recent decline in their functional status and demonstrates the ability to make significant improvements in function in a reasonable and predictable amount of time.     Precautions / Restrictions Precautions Precautions: Fall Restrictions Weight Bearing Restrictions Per Provider Order: No      Mobility  Bed Mobility Overal bed mobility: Modified Independent             General bed mobility comments: Use of bed rails, increased time    Transfers Overall transfer level: Needs assistance Equipment used:  None Transfers: Sit to/from Stand Sit to Stand: Contact guard assist                Ambulation/Gait Ambulation/Gait assistance: Min assist, Contact guard assist Gait Distance (Feet): 80 Feet Assistive device: Rolling walker (2 wheels), None Gait Pattern/deviations: Step-through pattern, Decreased stride length Gait velocity: decreased Gait velocity interpretation: <1.31 ft/sec, indicative of household ambulator   General Gait Details: Initially trialed with no AD to bathroom, pt with mild unsteadiness and reaching out for external support. Improvement with bilateral hand support on walker, slow pace  Stairs            Wheelchair Mobility     Tilt Bed    Modified Rankin (Stroke Patients Only) Modified Rankin (Stroke Patients Only) Pre-Morbid Rankin Score: No significant disability Modified Rankin: Moderately severe disability     Balance Overall balance assessment: Needs assistance Sitting-balance support: Feet supported Sitting balance-Leahy Scale: Good     Standing balance support: No upper extremity supported, During functional activity Standing balance-Leahy Scale: Poor                               Pertinent Vitals/Pain Pain Assessment Pain Assessment: 0-10 Pain Score: 3  Pain Location: headache Pain Descriptors / Indicators: Headache Pain Intervention(s): Monitored during session    Home Living Family/patient expects to be discharged to:: Private residence Living Arrangements: Other relatives (grandson) Available Help at Discharge: Family;Available PRN/intermittently Type of Home: House Home Access: Level entry       Home Layout: One level Home Equipment: Cane - single point      Prior Function Prior Level of Function : Independent/Modified Independent  Extremity/Trunk Assessment   Upper Extremity Assessment Upper Extremity Assessment: Defer to OT evaluation    Lower Extremity  Assessment Lower Extremity Assessment: Overall WFL for tasks assessed    Cervical / Trunk Assessment Cervical / Trunk Assessment: Other exceptions Cervical / Trunk Exceptions: increased body habitus  Communication   Communication Communication: No apparent difficulties    Cognition Arousal: Alert Behavior During Therapy: WFL for tasks assessed/performed   PT - Cognitive impairments: Memory, Awareness, Attention, Problem solving                         Following commands: Intact       Cueing Cueing Techniques: Verbal cues     General Comments      Exercises     Assessment/Plan    PT Assessment Patient needs continued PT services  PT Problem List Decreased activity tolerance;Decreased balance;Decreased mobility       PT Treatment Interventions DME instruction;Gait training;Functional mobility training;Therapeutic activities;Therapeutic exercise;Balance training;Patient/family education    PT Goals (Current goals can be found in the Care Plan section)  Acute Rehab PT Goals Patient Stated Goal: to walk PT Goal Formulation: With patient Time For Goal Achievement: 08/10/23 Potential to Achieve Goals: Good    Frequency Min 2X/week     Co-evaluation               AM-PAC PT 6 Clicks Mobility  Outcome Measure Help needed turning from your back to your side while in a flat bed without using bedrails?: None Help needed moving from lying on your back to sitting on the side of a flat bed without using bedrails?: None Help needed moving to and from a bed to a chair (including a wheelchair)?: A Little Help needed standing up from a chair using your arms (e.g., wheelchair or bedside chair)?: A Little Help needed to walk in hospital room?: A Little Help needed climbing 3-5 steps with a railing? : A Little 6 Click Score: 20    End of Session Equipment Utilized During Treatment: Gait belt Activity Tolerance: Patient tolerated treatment well Patient left:  in bed;with call bell/phone within reach;with bed alarm set Nurse Communication: Mobility status PT Visit Diagnosis: Unsteadiness on feet (R26.81)    Time: 8469-8440 PT Time Calculation (min) (ACUTE ONLY): 29 min   Charges:   PT Evaluation $PT Eval Low Complexity: 1 Low PT Treatments $Therapeutic Activity: 8-22 mins PT General Charges $$ ACUTE PT VISIT: 1 Visit         Aleck Davis, PT, DPT Acute Rehabilitation Services Office [phone]   Natasha Davis 07/27/2023, 4:27 PM

## 2023-07-27 NOTE — Hospital Course (Addendum)
 68 y.o. female with medical history significant of seizure disorder, acute CVA (required admission from 07/18/23-07/20/23 for breakthrough seizure and acute CVA requiring increase in dose of Keppra  by neurology), history of mitral valve replacement on Coumadin , GERD, hyperlipidemia, paroxysmal atrial fibrillation, HFrEF 40 to 45% and morbid obesity presented with worsening headache.   On presentation, CT head showed acute bilateral subdural hematoma. Neurosurgery was consulted and recommended to hold Lovenox  and warfarin and recommended to repeat CT head in 8 to 10 hours.   Cardiology was also consulted. Subsequently, patient had breakthrough seizures: Keppra  dose was increased.  Neurology was consulted; LTM EEG was negative for seizures.  LTM EEG discontinued.  Neurology signed off on 07/24/2023.   Assessment & Plan:   Subdural hematoma Intractable headache - bilateral SDH noted on CT head - Evaluated by neurosurgery and ultimately underwent embolization of right and left middle meningeal artery on 07/26/2023 in efforts to alleviate time off of anticoagulation - CT head repeated 7/3 and discussed with neurosurgery.  Okay to resume heparin /Coumadin  and monitoring - Outpatient follow-up with neurosurgery planned for repeat imaging as well  Hx Mechanical MVR - for hx mitral stenosis - heparin  started 7/3 and first dose Coumadin  given 7/4 - Continue heparin  while awaiting therapeutic INR  History of recent acute incidental CVA - In the setting of subtherapeutic INR and the patient with mechanical mitral valve -MRI brain from 07/18/2023 noted acute/subacute cortical infarct involving left parietal lobe; chronic left thalamic infarct also noted - Continue statin   Breakthrough seizure Hx seizure disorder - breakthru seizures in the hospital on 07/23/2023 - Neurology was consulted.  Keppra  dose has been increased to 750 mg BID. LTM EEG was negative for seizures.  LTM EEG discontinued - Neurology signed  off on 07/24/2023. Seizure precaution.  Outpatient follow with neurology    Persistent A-fib Prolonged QT interval - Currently rate controlled.  Continue metoprolol  -Continuing anticoagulation as noted above - Cardiology followed, appreciate assistance  HLD - continue statin  Normocytic anemia -Prior baseline around 11 to 12 g/dL -Remaining stable and near baseline   Chronic combined systolic and diastolic CHF Hypertension - Currently compensated - GDMT per cardiology   Elevated LFTs --questionable cause.  Mild.  Improving    Hyponatremia - stable - trend   Leukocytosis - Possibly reactive.  Improving.  Monitor   Morbid obesity - Complicates overall prognosis and care - Body mass index is 42.84 kg/m (pended).

## 2023-07-27 NOTE — Anesthesia Postprocedure Evaluation (Signed)
 Anesthesia Post Note  Patient: Natasha Davis  Procedure(s) Performed: RADIOLOGY WITH ANESTHESIA     Patient location during evaluation: PACU Anesthesia Type: General Level of consciousness: awake and alert Pain management: pain level controlled Vital Signs Assessment: post-procedure vital signs reviewed and stable Respiratory status: spontaneous breathing, nonlabored ventilation, respiratory function stable and patient connected to nasal cannula oxygen Cardiovascular status: blood pressure returned to baseline and stable Postop Assessment: no apparent nausea or vomiting Anesthetic complications: no   No notable events documented.              Dayne Dekay D Demontre Padin

## 2023-07-27 NOTE — Progress Notes (Signed)
  NEUROSURGERY PROGRESS NOTE   No issues overnight. Reports significantly improved HA today. No right leg pain  EXAM:  BP 126/60 (BP Location: Right Arm)   Pulse 65   Temp 98.3 F (36.8 C) (Oral)   Resp 18   Ht (P) 4' 11 (1.499 m)   Wt (P) 96.2 kg   SpO2 99%   BMI (P) 42.84 kg/m   Awake, alert, oriented  Speech fluent, appropriate  CN grossly intact  5/5 BUE/BLE  Right groin site soft  IMPRESSION:  68 y.o. female s/p bilateral MMA embolization for SDH Mech mitral valve currently off AC  PLAN: - Will get repeat CTH tomorrow, if stable can restart coumadin    Natasha Maizes, MD [facility]

## 2023-07-28 ENCOUNTER — Inpatient Hospital Stay (HOSPITAL_COMMUNITY)

## 2023-07-28 DIAGNOSIS — I480 Paroxysmal atrial fibrillation: Secondary | ICD-10-CM | POA: Diagnosis not present

## 2023-07-28 DIAGNOSIS — S065XAA Traumatic subdural hemorrhage with loss of consciousness status unknown, initial encounter: Secondary | ICD-10-CM | POA: Diagnosis not present

## 2023-07-28 DIAGNOSIS — I6389 Other cerebral infarction: Secondary | ICD-10-CM | POA: Diagnosis not present

## 2023-07-28 DIAGNOSIS — Z952 Presence of prosthetic heart valve: Secondary | ICD-10-CM | POA: Diagnosis not present

## 2023-07-28 DIAGNOSIS — I482 Chronic atrial fibrillation, unspecified: Secondary | ICD-10-CM | POA: Diagnosis not present

## 2023-07-28 LAB — BASIC METABOLIC PANEL WITH GFR
Anion gap: 11 (ref 5–15)
BUN: 21 mg/dL (ref 8–23)
CO2: 25 mmol/L (ref 22–32)
Calcium: 8.8 mg/dL — ABNORMAL LOW (ref 8.9–10.3)
Chloride: 95 mmol/L — ABNORMAL LOW (ref 98–111)
Creatinine, Ser: 1.07 mg/dL — ABNORMAL HIGH (ref 0.44–1.00)
GFR, Estimated: 56 mL/min — ABNORMAL LOW (ref >60)
Glucose, Bld: 98 mg/dL (ref 70–99)
Potassium: 3.9 mmol/L (ref 3.5–5.1)
Sodium: 131 mmol/L — ABNORMAL LOW (ref 135–145)

## 2023-07-28 LAB — CBC WITH DIFFERENTIAL/PLATELET
Abs Immature Granulocytes: 0.05 10*3/uL (ref 0.00–0.07)
Basophils Absolute: 0 10*3/uL (ref 0.0–0.1)
Basophils Relative: 0 %
Eosinophils Absolute: 0 10*3/uL (ref 0.0–0.5)
Eosinophils Relative: 0 %
HCT: 33.8 % — ABNORMAL LOW (ref 36.0–46.0)
Hemoglobin: 10.8 g/dL — ABNORMAL LOW (ref 12.0–15.0)
Immature Granulocytes: 0 %
Lymphocytes Relative: 9 %
Lymphs Abs: 1.2 10*3/uL (ref 0.7–4.0)
MCH: 30.2 pg (ref 26.0–34.0)
MCHC: 32 g/dL (ref 30.0–36.0)
MCV: 94.4 fL (ref 80.0–100.0)
Monocytes Absolute: 1.5 10*3/uL — ABNORMAL HIGH (ref 0.1–1.0)
Monocytes Relative: 12 %
Neutro Abs: 9.8 10*3/uL — ABNORMAL HIGH (ref 1.7–7.7)
Neutrophils Relative %: 79 %
Platelets: 304 10*3/uL (ref 150–400)
RBC: 3.58 MIL/uL — ABNORMAL LOW (ref 3.87–5.11)
RDW: 12.8 % (ref 11.5–15.5)
WBC: 12.5 10*3/uL — ABNORMAL HIGH (ref 4.0–10.5)
nRBC: 0 % (ref 0.0–0.2)

## 2023-07-28 LAB — MAGNESIUM: Magnesium: 2.1 mg/dL (ref 1.7–2.4)

## 2023-07-28 MED ORDER — DIPHENHYDRAMINE HCL 25 MG PO CAPS
25.0000 mg | ORAL_CAPSULE | Freq: Every evening | ORAL | Status: DC | PRN
  Administered 2023-07-28 – 2023-07-31 (×4): 25 mg via ORAL
  Filled 2023-07-28 (×4): qty 1

## 2023-07-28 MED ORDER — LACTULOSE 10 GM/15ML PO SOLN
30.0000 g | Freq: Two times a day (BID) | ORAL | Status: DC | PRN
  Administered 2023-07-28 – 2023-07-29 (×2): 30 g via ORAL
  Filled 2023-07-28 (×3): qty 60

## 2023-07-28 MED ORDER — WARFARIN - PHARMACIST DOSING INPATIENT: Freq: Every day | Status: DC

## 2023-07-28 MED ORDER — HYDROCODONE-ACETAMINOPHEN 5-325 MG PO TABS
1.0000 | ORAL_TABLET | Freq: Four times a day (QID) | ORAL | Status: DC | PRN
  Administered 2023-07-28 – 2023-08-08 (×36): 1 via ORAL
  Filled 2023-07-28 (×36): qty 1

## 2023-07-28 MED ORDER — LEVETIRACETAM 750 MG PO TABS
750.0000 mg | ORAL_TABLET | Freq: Two times a day (BID) | ORAL | Status: DC
  Administered 2023-07-28 – 2023-08-08 (×22): 750 mg via ORAL
  Filled 2023-07-28 (×22): qty 1

## 2023-07-28 MED ORDER — HEPARIN (PORCINE) 25000 UT/250ML-% IV SOLN
1050.0000 [IU]/h | INTRAVENOUS | Status: DC
  Administered 2023-07-28: 900 [IU]/h via INTRAVENOUS
  Administered 2023-07-29 – 2023-08-06 (×10): 1250 [IU]/h via INTRAVENOUS
  Administered 2023-08-07 – 2023-08-08 (×2): 1050 [IU]/h via INTRAVENOUS
  Filled 2023-07-28 (×13): qty 250

## 2023-07-28 MED ORDER — WARFARIN SODIUM 5 MG PO TABS
5.0000 mg | ORAL_TABLET | Freq: Once | ORAL | Status: AC
  Administered 2023-07-29: 5 mg via ORAL
  Filled 2023-07-28: qty 1

## 2023-07-28 NOTE — TOC Progression Note (Signed)
 Transition of Care [facility]) - Progression Note    Patient Details  Name: Natasha Davis MRN: [id number] Date of Birth: 1955-11-11  Transition of Care [facility]) CM/SW Contact  Andrez JULIANNA George, RN Phone Number: 07/28/2023, 2:46 PM  Clinical Narrative:     Home health arranged with [facility]. Information on the AVS. [facility] will contact her for the first home visit.  Walker for home delivered to the room per [employer]. TOC following.  Expected Discharge Plan: Home w Home Health Services Barriers to Discharge: Continued Medical Work up  Expected Discharge Plan and Services   Discharge Planning Services: CM Consult   Living arrangements for the past 2 months: Single Family Home                           HH Arranged: PT [facility] Agency: [facility] Date [facility] Agency Contacted: 07/28/23   Representative spoke with at [facility] Agency: Darleene   Social Determinants of Health (SDOH) Interventions SDOH Screenings   Food Insecurity: No Food Insecurity (07/23/2023)  Housing: Low Risk  (07/23/2023)  Transportation Needs: No Transportation Needs (07/23/2023)  Utilities: Not At Risk (07/23/2023)  Social Connections: Moderately Integrated (07/23/2023)  Tobacco Use: Medium Risk (07/26/2023)    Readmission Risk Interventions     No data to display

## 2023-07-28 NOTE — Progress Notes (Signed)
 OT Cancellation Note  Patient Details Name: Natasha Davis MRN: [id number] DOB: 26-May-1955   Cancelled Treatment:    Reason Eval/Treat Not Completed: Fatigue/lethargy limiting ability to participate (Pt requesting therapy come back at later time, has had a lack of rest.OT will follow-up with pt as able.)  07/28/2023  AB, OTR/L  Acute Rehabilitation Services  Office: [phone]   Curtistine JONETTA Das 07/28/2023, 2:13 PM

## 2023-07-28 NOTE — Progress Notes (Signed)
 PHARMACY - ANTICOAGULATION CONSULT NOTE  Pharmacy Consult for heparin  bridge + warfarin (PTA) Indication: PAF w/ history of mitral valve replacement (mechanical)  Allergies  Allergen Reactions   Penicillins Other (See Comments)    Couldn't walk    Patient Measurements: Height: (P) 4' 11 (149.9 cm) Weight: (P) 96.2 kg (212 lb 1.3 oz) IBW/kg (Calculated) : (P) 43.2 HEPARIN  DW (KG): (P) 66.7  Vital Signs: Temp: 98.2 F (36.8 C) (07/03 1233) Temp Source: Oral (07/03 1233) BP: 126/52 (07/03 1233) Pulse Rate: 58 (07/03 1233)  Labs: Recent Labs    07/26/23 0451 07/27/23 0442 07/28/23 0424  HGB 12.4 10.7* 10.8*  HCT 37.0 32.7* 33.8*  PLT 285 260 304  CREATININE 0.89 0.92 1.07*    Estimated Creatinine Clearance: 50.4 mL/min (A) (by C-G formula based on SCr of 1.07 mg/dL (H)).   Medical History: Past Medical History:  Diagnosis Date   Chronic anticoagulation    Claudication (HCC)    History of CVA (cerebrovascular accident)    Hypertension    Mitral stenosis    Seizure (HCC)    HX    Medications:  Medications Prior to Admission  Medication Sig Dispense Refill Last Dose/Taking   acetaminophen  (TYLENOL ) 650 MG CR tablet Take 1,300 mg by mouth daily as needed for pain. Pain   07/22/2023 at  3:00 PM   atorvastatin  (LIPITOR) 20 MG tablet TAKE 1 TABLET BY MOUTH EVERY DAY (Patient taking differently: Take 20 mg by mouth at bedtime.) 90 tablet 2 07/21/2023   enoxaparin  (LOVENOX ) 100 MG/ML injection Inject 1 mL (100 mg total) into the skin every 12 (twelve) hours. 14 mL 0 07/22/2023 Morning   levETIRAcetam  (KEPPRA ) 500 MG tablet Take 1 tablet (500 mg total) by mouth 2 (two) times daily. 180 tablet 0 07/22/2023 Morning   metoprolol  tartrate (LOPRESSOR ) 50 MG tablet TAKE 1 TABLET BY MOUTH IN THE  MORNING 90 tablet 3 07/22/2023 Morning   Multiple Vitamins-Minerals (CENTRUM SILVER ADULT 50+) TABS Take 1 tablet by mouth in the morning.   07/22/2023 Morning   omeprazole  (PRILOSEC) 20  MG capsule TAKE 2 CAPSULES BY MOUTH EVERY DAY (Patient taking differently: Take 40 mg by mouth daily as needed (reflux).) 180 capsule 2 Past Month   potassium chloride  (KLOR-CON ) 10 MEQ tablet Take 2 tablets (20 mEq total) by mouth 2 (two) times daily. 360 tablet 3 07/22/2023 Morning   spironolactone  (ALDACTONE ) 25 MG tablet Take 1 tablet (25 mg total) by mouth daily. 90 tablet 3 07/22/2023 Morning   triamterene -hydrochlorothiazide (MAXZIDE-25) 37.5-25 MG tablet Take 1 tablet by mouth daily.   07/22/2023 Morning   warfarin (COUMADIN ) 5 MG tablet TAKE 1 TABLET BY MOUTH DAILY OR AS DIRECTED BY COUMADIN  CLINIC (Patient taking differently: Take 5 mg by mouth daily after supper. TAKE 1 TABLET BY MOUTH DAILY OR AS DIRECTED BY COUMADIN  CLINIC) 100 tablet 1 07/22/2023 Evening    Assessment: 67 YOF w/ PMH significant for PAF and MVR- mechanical on coumadin  PTA requiring restart of coumadin  and a heparin  bridge until therapeutic. She has had her PTA coumadin  held for a SDH Last dose of coumadin  was taken 07/22/2023 PTA coumadin  dosing is 5 mg daily Last INR on 6/29 1.8  Goal of Therapy:  INR 2.5-3.5 Heparin  level 0.3-0.5 units/ml Monitor platelets by anticoagulation protocol: Yes   Plan:  Start heparin  infusion at 900 units/hr Warfarin 5 mg po daily (PTA dose) Check anti-Xa level in 8 hours and daily while on heparin  Check daily INR Continue to monitor  H&H and platelets  Arieanna Pressey BS, PharmD, BCPS Clinical Pharmacist 07/28/2023 5:27 PM  Contact: [phone] after 3 PM  Be curious, not judgmental... -Davina Sprinkles

## 2023-07-28 NOTE — Evaluation (Signed)
 Occupational Therapy Evaluation Patient Details Name: Natasha Davis MRN: [id number] DOB: Oct 09, 1955 Today's Date: 07/28/2023   History of Present Illness   Pt is a 68 y.o. F who presents 07/22/2023 with worsening headache. On presentation, CT head showed acute bilateral subdural hematoma with some shift. S/p bilateral MMA embolization 7/1. PMH includes: CVA (admission 6/23-6/25/2025 for breakthrough seizure and acute CVA), HTN, seizure.     Clinical Impressions Pt admitted for above, PTA pt was ind with her ADLs/iADLs and mobility. Pt currently presenting with LUE coordination and strength deficits but they were present at baseline from recent CVA. She also demonstrates impaired balance and activity tolerance with L inattention as well. Pt completing ADLS with min A to CGA and ambulating with CGA + RW. OT to continue following pt acutely to address listed deficits and she would benefit from further work on CVA deficits. Patient would benefit from post acute Home OT services to help maximize functional independence in natural environment      If plan is discharge home, recommend the following:   A little help with bathing/dressing/bathroom;Assistance with cooking/housework;Direct supervision/assist for medications management;Direct supervision/assist for financial management;Assist for transportation;Help with stairs or ramp for entrance     Functional Status Assessment   Patient has had a recent decline in their functional status and demonstrates the ability to make significant improvements in function in a reasonable and predictable amount of time.     Equipment Recommendations   Tub/shower seat;Other (comment) (RW)     Recommendations for Other Services         Precautions/Restrictions   Precautions Precautions: Fall Recall of Precautions/Restrictions: Intact Restrictions Weight Bearing Restrictions Per Provider Order: No     Mobility Bed Mobility                General bed mobility comments: Pt sitting EOB on arrival    Transfers Overall transfer level: Needs assistance Equipment used: None Transfers: Sit to/from Stand Sit to Stand: Contact guard assist                  Balance Overall balance assessment: Needs assistance Sitting-balance support: Feet supported Sitting balance-Leahy Scale: Good     Standing balance support: No upper extremity supported, During functional activity Standing balance-Leahy Scale: Poor Standing balance comment: Rw support                           ADL either performed or assessed with clinical judgement   ADL Overall ADL's : Needs assistance/impaired Eating/Feeding: Sitting;Independent   Grooming: Standing;Contact guard assist   Upper Body Bathing: Standing;Contact guard assist   Lower Body Bathing: Minimal assistance;Sitting/lateral leans   Upper Body Dressing : Sitting;Minimal assistance   Lower Body Dressing: Minimal assistance;Sit to/from stand   Toilet Transfer: Contact guard assist;Ambulation   Toileting- Clothing Manipulation and Hygiene: Contact guard assist;Sitting/lateral lean       Functional mobility during ADLs: Contact guard assist;Rolling walker (2 wheels)       Vision Baseline Vision/History: 1 Wears glasses Ability to See in Adequate Light: 0 Adequate Patient Visual Report: No change from baseline Vision Assessment?: Yes Eye Alignment: Within Functional Limits Ocular Range of Motion: Within Functional Limits Alignment/Gaze Preference: Within Defined Limits Tracking/Visual Pursuits: Able to track stimulus in all quads without difficulty Convergence: Within functional limits Visual Fields:  (TBD.) Additional Comments: Pt identifying # of therpists fingers in all fields accurately.     Perception Perception: Impaired Preception Impairment Details:  Inattention/Neglect Perception-Other Comments: L inattention   Praxis Praxis: WFL       Pertinent  Vitals/Pain Pain Assessment Pain Assessment: Faces Faces Pain Scale: Hurts little more Pain Location: headache Pain Descriptors / Indicators: Headache Pain Intervention(s): Monitored during session     Extremity/Trunk Assessment Upper Extremity Assessment Upper Extremity Assessment: LUE deficits/detail;Right hand dominant;RUE deficits/detail RUE Deficits / Details: Shoulder flexion 4/5 MMT, ROM WFL. grip intact. RUE Sensation: WNL RUE Coordination: WNL LUE Deficits / Details: L UE hx of decreased coordination from prior CVA. decreased FTN coord. Weaker at baseline from prior CVA. SHoudler flexion 3+/5, elbow flex/ext 4/5 MMT. LUE Sensation: decreased light touch LUE Coordination: decreased fine motor           Communication Communication Communication: No apparent difficulties   Cognition Arousal: Alert Behavior During Therapy: WFL for tasks assessed/performed Cognition: Cognition impaired     Awareness: Intellectual awareness intact, Online awareness impaired     Executive functioning impairment (select all impairments): Problem solving                   Following commands: Impaired Following commands impaired: Follows one step commands with increased time, Follows multi-step commands inconsistently     Cueing  General Comments   Cueing Techniques: Verbal cues  pt family present and suppotive. Pt c/o excess fatigue but VSS. She did c/o lack of sleep last night and today.   Exercises     Shoulder Instructions      Home Living Family/patient expects to be discharged to:: Private residence Living Arrangements: Other relatives (grandson) Available Help at Discharge: Family;Available PRN/intermittently Type of Home: House Home Access: Level entry     Home Layout: One level     Bathroom Shower/Tub: Chief Strategy Officer: Standard     Home Equipment: Cane - single point          Prior Functioning/Environment Prior Level of Function  : Independent/Modified Independent             Mobility Comments: ind ADLs Comments: manages ADLs, IADLs (meds)    OT Problem List: Decreased activity tolerance;Impaired balance (sitting and/or standing);Impaired vision/perception;Decreased strength;Impaired sensation   OT Treatment/Interventions: Self-care/ADL training;Therapeutic exercise;DME and/or AE instruction;Therapeutic activities;Patient/family education;Balance training;Cognitive remediation/compensation;Energy conservation;Visual/perceptual remediation/compensation      OT Goals(Current goals can be found in the care plan section)   Acute Rehab OT Goals Patient Stated Goal: To go home OT Goal Formulation: With patient Time For Goal Achievement: 08/11/23 Potential to Achieve Goals: Good ADL Goals Pt Will Perform Grooming: with modified independence;standing Pt Will Perform Lower Body Dressing: with modified independence;sit to/from stand Pt Will Transfer to Toilet: with modified independence;ambulating Additional ADL Goal #1: Pt will navigate around 3/3 obstacles on L side to demonstrate decreaseing L inattention.   OT Frequency:  Min 2X/week    Co-evaluation              AM-PAC OT 6 Clicks Daily Activity     Outcome Measure Help from another person eating meals?: None Help from another person taking care of personal grooming?: A Little Help from another person toileting, which includes using toliet, bedpan, or urinal?: A Little Help from another person bathing (including washing, rinsing, drying)?: A Little Help from another person to put on and taking off regular upper body clothing?: A Little Help from another person to put on and taking off regular lower body clothing?: A Little 6 Click Score: 19   End of Session Equipment Utilized During  Treatment: Gait belt;Rolling walker (2 wheels) Nurse Communication: Mobility status  Activity Tolerance: Patient tolerated treatment well Patient left: in  bed;with call bell/phone within reach;with family/visitor present  OT Visit Diagnosis: Other abnormalities of gait and mobility (R26.89);Pain;Unsteadiness on feet (R26.81) Pain - Right/Left:  (headache)                Time: 8287-8263 OT Time Calculation (min): 24 min Charges:  OT General Charges $OT Visit: 1 Visit OT Evaluation $OT Eval Moderate Complexity: 1 Mod OT Treatments $Therapeutic Activity: 8-22 mins  07/28/2023  AB, OTR/L  Acute Rehabilitation Services  Office: [phone]   Natasha Davis 07/28/2023, 5:52 PM

## 2023-07-28 NOTE — Progress Notes (Signed)
 Cardiology Progress Note  Patient ID: Natasha Davis MRN: [id number] DOB: [birth year] Date of Encounter: 07/28/2023 Primary Cardiologist: Natasha Passe, MD -> [place]   Subjective   Chief Complaint: None.   HPI: Denies chest pain or trouble breathing. Getting f/u CT this am post meningeal artery embolization   ROS:  All other ROS reviewed and negative. Pertinent positives noted in the HPI.     Telemetry  Overnight telemetry shows A-fib heart rate 60s, which I personally reviewed.    Physical Exam   Vitals:   07/27/23 1939 07/27/23 2351 07/28/23 0341 07/28/23 0741  BP: (!) 139/56 (!) 134/58 (!) 146/57 (!) 146/71  Pulse: 60 67 67 71  Resp:  18 18 14   Temp: 98.7 F (37.1 C) 98.5 F (36.9 C) 98.9 F (37.2 C) 98.3 F (36.8 C)  TempSrc: Oral  Oral Oral  SpO2: 99% 96% 98% 99%  Weight:      Height:       No intake or output data in the 24 hours ending 07/28/23 0947      07/26/2023   11:07 AM 07/22/2023    7:35 PM 07/21/2023   10:41 AM  Last 3 Weights  Weight (lbs) 212 lb 1.3 oz 212 lb 1.3 oz 212 lb  Weight (kg) 96.2 kg 96.2 kg 96.163 kg    Body mass index is 42.84 kg/m (pended).   Affect appropriate Healthy:  appears stated age HEENT: normal Neck supple with no adenopathy JVP normal no bruits no thyromegaly Lungs clear with no wheezing and good diaphragmatic motion Heart:  S1 click /S2 no murmur, no rub, gallop or click PMI normal Abdomen: benighn, BS positve, no tenderness, no AAA no bruit.  No HSM or HJR Distal pulses intact with no bruits No edema Neuro mild left sided weakness chronic  Skin warm and dry No muscular weakness   Cardiac Studies  TTE 07/19/2023  1. Left ventricular ejection fraction, by estimation, is 40 to 45%. The  left ventricle has mildly decreased function. The left ventricle  demonstrates global hypokinesis. There is moderate concentric left  ventricular hypertrophy. Left ventricular  diastolic parameters are indeterminate.   2. Right  ventricular systolic function is mildly reduced. The right  ventricular size is normal. There is normal pulmonary artery systolic  pressure.   3. Left atrial size was severely dilated.   4. The mitral valve has been repaired/replaced. No evidence of mitral  valve regurgitation. The mean mitral valve gradient is 4.3 mmHg.   5. The aortic valve is tricuspid. There is mild calcification of the  aortic valve. Aortic valve regurgitation is not visualized. Aortic valve  sclerosis is present, with no evidence of aortic valve stenosis.   6. The inferior vena cava is normal in size with <50% respiratory  variability, suggesting right atrial pressure of 8 mmHg.   Patient Profile  Natasha Davis is a 68 y.o. female with mechanical mitral valve, persistent atrial fibrillation, systolic heart failure (EF 40-45%), stroke, hypertension admitted on 07/22/2023 with subdural hematomas.  Cardiology consulted for anticoagulation management in the setting of mechanical mitral valve.  Assessment & Plan   # Bilateral subdural hematomas # Mechanical mitral valve -  coumadin  held with subdural hematomas - MMA embolization 07/26/23 went well  - ? Resume heparin /lovenox  and coumadin  post procedure - INR goal 2.5-3.5 - Mean gradient across MV 4.3 mmHg with normal function on TTE 07/19/23 - Her last Rx INR was 07/22/23 Have left chat message for neurosurgery/primary to make sure  heparin /coumadin  started ASAP ? If post procedure CT is stable    # Recent L Parietal CVA - Admitted earlier last week with acute stroke.  - INR sub Rx - at risk with mechanical valve and chronic afib off anticoagulation   # Chronic systolic heart failure, EF 40-45% - No signs of volume overload.  Continue Lasix  40 mg daily. - Stop metoprolol  to tartrate.  Transition to metoprolol  succinate 25 mg daily. - Stop triamterene  HCTZ.  Start losartan 50 mg daily.  Add Aldactone  25 mg daily.  No SGLT2 with obesity  # Persistent atrial  fibrillation - Rate controlled on metoprolol .  Anticoagulation  held  If CT stable this am would start coumadin  tonight Normal dose is 5 mg daily It will take 3-4 days to be Rx. Starting heparin  today at discretion of neurosurgery     For questions or updates, please contact [facility] Please consult [email or web address] for contact info under    Maude Emmer MD [facility]

## 2023-07-28 NOTE — Plan of Care (Signed)
   Problem: Education: Goal: Knowledge of General Education information will improve Description: Including pain rating scale, medication(s)/side effects and non-pharmacologic comfort measures Outcome: Progressing   Problem: Clinical Measurements: Goal: Diagnostic test results will improve Outcome: Progressing   Problem: Activity: Goal: Risk for activity intolerance will decrease Outcome: Progressing   Problem: Pain Managment: Goal: General experience of comfort will improve and/or be controlled Outcome: Progressing

## 2023-07-28 NOTE — Plan of Care (Signed)

## 2023-07-28 NOTE — Progress Notes (Signed)
 Progress Note    Natasha Davis   FMW:[id number]  DOB: 10-23-1955  DOA: 07/22/2023     6 PCP: Marvine Rush, MD  Initial CC: Headache  Hospital Course: 68 y.o. female with medical history significant of seizure disorder, acute CVA (required admission from 07/18/23-07/20/23 for breakthrough seizure and acute CVA requiring increase in dose of Keppra  by neurology), history of mitral valve replacement on Coumadin , GERD, hyperlipidemia, paroxysmal atrial fibrillation, HFrEF 40 to 45% and morbid obesity presented with worsening headache.   On presentation, CT head showed acute bilateral subdural hematoma. Neurosurgery was consulted and recommended to hold Lovenox  and warfarin and recommended to repeat CT head in 8 to 10 hours.   Cardiology was also consulted. Subsequently, patient had breakthrough seizures: Keppra  dose was increased.  Neurology was consulted; LTM EEG was negative for seizures.  LTM EEG discontinued.  Neurology signed off on 07/24/2023.   Assessment & Plan:   Subdural hematoma Intractable headache - bilateral SDH noted on CT head - Evaluated by neurosurgery and ultimately underwent embolization of right and left middle meningeal artery on 07/26/2023 in efforts to alleviate time off of anticoagulation - Follow-up further neurosurgery recommendations on anticoag resumption; CT head repeated 7/3 (will discuss with neurosurgery) and resume heparin dortha as able  History of recent acute incidental CVA - In the setting of subtherapeutic INR and the patient with mechanical mitral valve -MRI brain from 07/18/2023 noted acute/subacute cortical infarct involving left parietal lobe; chronic left thalamic infarct also noted - resuming coumadin  as able - Continue statin.    Breakthrough seizure Hx seizure disorder - breakthru seizures in the hospital on 07/23/2023 - Neurology was consulted.  Keppra  dose has been increased to 750 mg BID. LTM EEG was negative for seizures.  LTM EEG  discontinued - Neurology signed off on 07/24/2023. Seizure precaution.  Outpatient follow with neurology    Persistent A-fib Prolonged QT interval - Currently rate controlled.  Continue metoprolol .  Anticoagulation on hold as above.  Telemetry monitoring.  Avoid QT prolonging medications.  Cardiology following  HLD - continue statin  Normocytic anemia -Prior baseline around 11 to 12 g/dL -Mild drop from baseline, possibly in setting of underlying SDH -Continue trending for now   Hx Mechanical MVR - for hx mitral stenosis -Anticoagulation to remain on hold till neurosurgery clearance to resume it.  Cardiology aware/following   Chronic combined systolic and diastolic CHF Hypertension - Currently compensated - Continue Lasix , metoprolol , losartan and spironolactone  as per cardiology.    Elevated LFTs --questionable cause.  Mild.  Improving monitor.   Hyponatremia - stable - trend   Leukocytosis - Possibly reactive.  Improving.  Monitor   Morbid obesity - Complicates overall prognosis and care - Body mass index is 42.84 kg/m (pended).  Interval History:  Headache improved since admission she says.  Otherwise seems to be overall doing okay.  Still no BM yet, but will escalate regimen some today.  Awaiting CT head results and discussion with neurosurgery in efforts to resume her Coumadin  with a heparin  bridge.   Old records reviewed in assessment of this patient  Antimicrobials:   DVT prophylaxis:  SCDs Start: 07/22/23 2301 Place TED hose Start: 07/22/23 2301   Code Status:   Code Status: Full Code  Mobility Assessment (Last 72 Hours)     Mobility Assessment     Row Name 07/28/23 0800 07/27/23 2150 07/27/23 1600 07/27/23 0945 07/26/23 2100   Does patient have an order for bedrest or is patient medically unstable No -  Continue assessment No - Continue assessment -- No - Continue assessment No - Continue assessment   What is the highest level of mobility based on  the progressive mobility assessment? Level 5 (Walks with assist in room/hall) - Balance while stepping forward/back and can walk in room with assist - Complete Level 5 (Walks with assist in room/hall) - Balance while stepping forward/back and can walk in room with assist - Complete Level 5 (Walks with assist in room/hall) - Balance while stepping forward/back and can walk in room with assist - Complete Level 5 (Walks with assist in room/hall) - Balance while stepping forward/back and can walk in room with assist - Complete Level 5 (Walks with assist in room/hall) - Balance while stepping forward/back and can walk in room with assist - Complete   Is the above level different from baseline mobility prior to current illness? Yes - Recommend PT order Yes - Recommend PT order -- Yes - Recommend PT order Yes - Recommend PT order    Row Name 07/26/23 0807 07/25/23 2039         Does patient have an order for bedrest or is patient medically unstable No - Continue assessment No - Continue assessment      What is the highest level of mobility based on the progressive mobility assessment? Level 5 (Walks with assist in room/hall) - Balance while stepping forward/back and can walk in room with assist - Complete Level 5 (Walks with assist in room/hall) - Balance while stepping forward/back and can walk in room with assist - Complete      Is the above level different from baseline mobility prior to current illness? Yes - Recommend PT order Yes - Recommend PT order         Barriers to discharge: None Disposition Plan: None HH orders placed: TBD Status is: Inpatient  Objective: Blood pressure (!) 126/52, pulse (!) 58, temperature 98.2 F (36.8 C), temperature source Oral, resp. rate 19, height (P) 4' 11 (1.499 m), weight (P) 96.2 kg, SpO2 96%.  Examination:  Physical Exam Constitutional:      Appearance: Normal appearance.  HENT:     Head: Normocephalic and atraumatic.     Mouth/Throat:     Mouth: Mucous  membranes are moist.  Eyes:     Extraocular Movements: Extraocular movements intact.  Cardiovascular:     Rate and Rhythm: Normal rate and regular rhythm.  Pulmonary:     Effort: Pulmonary effort is normal. No respiratory distress.     Breath sounds: Normal breath sounds. No wheezing.  Abdominal:     General: Bowel sounds are normal. There is no distension.     Palpations: Abdomen is soft.     Tenderness: There is no abdominal tenderness.  Musculoskeletal:        General: Normal range of motion.     Cervical back: Normal range of motion and neck supple.  Skin:    General: Skin is warm and dry.  Neurological:     Mental Status: She is alert.     Comments:  4/5 strength in LUE/LLE and paresthesias in both left upper and lower extremities per patient  Psychiatric:        Mood and Affect: Mood normal.      Consultants:  Neurosurgery  Procedures:  07/26/2023 PROCEDURE: Onyx embolization of right middle meningeal artery  Onyx embolization of left middle meningeal artery   Data Reviewed: Results for orders placed or performed during the hospital encounter of 07/22/23 (from  the past 24 hours)  Basic metabolic panel with GFR     Status: Abnormal   Collection Time: 07/28/23  4:24 AM  Result Value Ref Range   Sodium 131 (L) 135 - 145 mmol/L   Potassium 3.9 3.5 - 5.1 mmol/L   Chloride 95 (L) 98 - 111 mmol/L   CO2 25 22 - 32 mmol/L   Glucose, Bld 98 70 - 99 mg/dL   BUN 21 8 - 23 mg/dL   Creatinine, Ser 8.92 (H) 0.44 - 1.00 mg/dL   Calcium  8.8 (L) 8.9 - 10.3 mg/dL   GFR, Estimated 56 (L) >60 mL/min   Anion gap 11 5 - 15  CBC with Differential/Platelet     Status: Abnormal   Collection Time: 07/28/23  4:24 AM  Result Value Ref Range   WBC 12.5 (H) 4.0 - 10.5 K/uL   RBC 3.58 (L) 3.87 - 5.11 MIL/uL   Hemoglobin 10.8 (L) 12.0 - 15.0 g/dL   HCT 66.1 (L) 63.9 - 53.9 %   MCV 94.4 80.0 - 100.0 fL   MCH 30.2 26.0 - 34.0 pg   MCHC 32.0 30.0 - 36.0 g/dL   RDW 87.1 88.4 - 84.4 %    Platelets 304 150 - 400 K/uL   nRBC 0.0 0.0 - 0.2 %   Neutrophils Relative % 79 %   Neutro Abs 9.8 (H) 1.7 - 7.7 K/uL   Lymphocytes Relative 9 %   Lymphs Abs 1.2 0.7 - 4.0 K/uL   Monocytes Relative 12 %   Monocytes Absolute 1.5 (H) 0.1 - 1.0 K/uL   Eosinophils Relative 0 %   Eosinophils Absolute 0.0 0.0 - 0.5 K/uL   Basophils Relative 0 %   Basophils Absolute 0.0 0.0 - 0.1 K/uL   Immature Granulocytes 0 %   Abs Immature Granulocytes 0.05 0.00 - 0.07 K/uL  Magnesium     Status: None   Collection Time: 07/28/23  4:24 AM  Result Value Ref Range   Magnesium 2.1 1.7 - 2.4 mg/dL    I have reviewed pertinent nursing notes, vitals, labs, and images as necessary. I have ordered labwork to follow up on as indicated.  I have reviewed the last notes from staff over past 24 hours. I have discussed patient's care plan and test results with nursing staff, CM/SW, and other staff as appropriate.  Time spent: Greater than 50% of the 55 minute visit was spent in counseling/coordination of care for the patient as laid out in the A&P.   LOS: 6 days   Alm Apo, MD [facility] 07/28/2023, 4:38 PM

## 2023-07-28 NOTE — Progress Notes (Signed)
  NEUROSURGERY PROGRESS NOTE   No issues overnight.   EXAM:  BP (!) 146/71 (BP Location: Right Arm)   Pulse 71   Temp 98.3 F (36.8 C) (Oral)   Resp 14   Ht (P) 4' 11 (1.499 m)   Wt (P) 96.2 kg   SpO2 99%   BMI (P) 42.84 kg/m   Awake, alert, oriented  Speech fluent, appropriate  CN grossly intact  5/5 BUE/BLE  Right groin site soft  IMPRESSION:  68 y.o. female s/p bilateral MMA embolization for SDH Mech mitral valve currently off AC  PLAN: - Will get repeat CTH. If ok can restart coumadin  and plan on outpatient f/u with repeat CTH in 2 weeks   Gerldine Maizes, MD [facility]

## 2023-07-29 DIAGNOSIS — S065XAA Traumatic subdural hemorrhage with loss of consciousness status unknown, initial encounter: Secondary | ICD-10-CM | POA: Diagnosis not present

## 2023-07-29 DIAGNOSIS — Z7901 Long term (current) use of anticoagulants: Secondary | ICD-10-CM | POA: Diagnosis not present

## 2023-07-29 DIAGNOSIS — Z952 Presence of prosthetic heart valve: Secondary | ICD-10-CM | POA: Diagnosis not present

## 2023-07-29 DIAGNOSIS — I480 Paroxysmal atrial fibrillation: Secondary | ICD-10-CM | POA: Diagnosis not present

## 2023-07-29 DIAGNOSIS — Z5181 Encounter for therapeutic drug level monitoring: Secondary | ICD-10-CM | POA: Diagnosis not present

## 2023-07-29 LAB — BASIC METABOLIC PANEL WITH GFR
Anion gap: 12 (ref 5–15)
BUN: 18 mg/dL (ref 8–23)
CO2: 24 mmol/L (ref 22–32)
Calcium: 8.7 mg/dL — ABNORMAL LOW (ref 8.9–10.3)
Chloride: 93 mmol/L — ABNORMAL LOW (ref 98–111)
Creatinine, Ser: 0.79 mg/dL (ref 0.44–1.00)
GFR, Estimated: >60 mL/min (ref >60)
Glucose, Bld: 125 mg/dL — ABNORMAL HIGH (ref 70–99)
Potassium: 4.4 mmol/L (ref 3.5–5.1)
Sodium: 129 mmol/L — ABNORMAL LOW (ref 135–145)

## 2023-07-29 LAB — CBC WITH DIFFERENTIAL/PLATELET
Abs Immature Granulocytes: 0.11 K/uL — ABNORMAL HIGH (ref 0.00–0.07)
Basophils Absolute: 0 K/uL (ref 0.0–0.1)
Basophils Relative: 0 %
Eosinophils Absolute: 0.1 K/uL (ref 0.0–0.5)
Eosinophils Relative: 0 %
HCT: 32.6 % — ABNORMAL LOW (ref 36.0–46.0)
Hemoglobin: 10.6 g/dL — ABNORMAL LOW (ref 12.0–15.0)
Immature Granulocytes: 1 %
Lymphocytes Relative: 11 %
Lymphs Abs: 1.6 K/uL (ref 0.7–4.0)
MCH: 30.8 pg (ref 26.0–34.0)
MCHC: 32.5 g/dL (ref 30.0–36.0)
MCV: 94.8 fL (ref 80.0–100.0)
Monocytes Absolute: 1.6 K/uL — ABNORMAL HIGH (ref 0.1–1.0)
Monocytes Relative: 11 %
Neutro Abs: 11.3 K/uL — ABNORMAL HIGH (ref 1.7–7.7)
Neutrophils Relative %: 77 %
Platelets: 310 K/uL (ref 150–400)
RBC: 3.44 MIL/uL — ABNORMAL LOW (ref 3.87–5.11)
RDW: 12.7 % (ref 11.5–15.5)
WBC: 14.6 K/uL — ABNORMAL HIGH (ref 4.0–10.5)
nRBC: 0 % (ref 0.0–0.2)

## 2023-07-29 LAB — HEPARIN LEVEL (UNFRACTIONATED)
Heparin Unfractionated: 0.2 [IU]/mL — ABNORMAL LOW (ref 0.30–0.70)
Heparin Unfractionated: 0.25 [IU]/mL — ABNORMAL LOW (ref 0.30–0.70)
Heparin Unfractionated: 0.39 [IU]/mL (ref 0.30–0.70)

## 2023-07-29 LAB — MAGNESIUM: Magnesium: 2 mg/dL (ref 1.7–2.4)

## 2023-07-29 LAB — PROTIME-INR
INR: 1.2 (ref 0.8–1.2)
Prothrombin Time: 15.5 s — ABNORMAL HIGH (ref 11.4–15.2)

## 2023-07-29 NOTE — Progress Notes (Signed)
 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  Allergies  Allergen Reactions   Penicillins Other (See Comments)    Couldn't walk    Patient Measurements: Height: (P) 4' 11 (149.9 cm) Weight: (P) 96.2 kg (212 lb 1.3 oz) IBW/kg (Calculated) : (P) 43.2 HEPARIN  DW (KG): (P) 66.7  Vital Signs: Temp: 99.3 F (37.4 C) (07/04 0820) Temp Source: Oral (07/04 0820) BP: 144/59 (07/04 0820) Pulse Rate: 79 (07/04 0820)  Labs: Recent Labs    07/27/23 0442 07/28/23 0424 07/29/23 0400 07/29/23 1011  HGB 10.7* 10.8* 10.6*  --   HCT 32.7* 33.8* 32.6*  --   PLT 260 304 310  --   LABPROT  --   --  15.5*  --   INR  --   --  1.2  --   HEPARINUNFRC  --   --  0.20* 0.25*  CREATININE 0.92 1.07* 0.79  --     Estimated Creatinine Clearance: 67.5 mL/min (by C-G formula based on SCr of 0.79 mg/dL).  Assessment: 19 YOF w/ PMH significant for PAF and mechanical MVR on Warfarin PTA. Held on admit due to SDH. S/p embolization on 07/26/23.SABRA Cleared to resume warfarin and heparin  bridge on 07/28/23 after repeat head CT.  Targeting low therapeutic heparin  levels.  PTA warfarin regimen: 5 mg daily; last dose 07/22/23 prior to admit.  Heparin  level is subtherapeutic (0.25) on 1100 units/hr after infusion rate increased ~5am. No infusion problems or bleeding reported.  CBC stable. INR 2.1 on admit 6/27>>down to 1.2 today.  Warfarin order from 7/3 pm to begin 7/4.  Goal of Therapy:  Heparin  level 0.3-0.5 units/ml INR 2.5-3.5 Monitor platelets by anticoagulation protocol: Yes   Plan:  Increase heparin  drip to 1250 units/hr Heparin  level ~6 hrs after rate change. Warfarin 5 mg x 1 to be given today. Daily heparin  level, PT/INR and CBC. Monitor for signs/symptoms of bleeding.  Genaro Zebedee Calin, RPh 07/29/2023,12:21 PM

## 2023-07-29 NOTE — Plan of Care (Signed)
  Problem: Education: Goal: Knowledge of General Education information will improve Description: Including pain rating scale, medication(s)/side effects and non-pharmacologic comfort measures Outcome: Progressing   Problem: Clinical Measurements: Goal: Ability to maintain clinical measurements within normal limits will improve Outcome: Progressing Goal: Will remain free from infection Outcome: Progressing Goal: Respiratory complications will improve Outcome: Progressing Goal: Cardiovascular complication will be avoided Outcome: Progressing   Problem: Activity: Goal: Risk for activity intolerance will decrease Outcome: Progressing   Problem: Elimination: Goal: Will not experience complications related to urinary retention Outcome: Progressing   Problem: Elimination: Goal: Will not experience complications related to bowel motility Outcome: Not Progressing   Problem: Pain Managment: Goal: General experience of comfort will improve and/or be controlled Outcome: Not Progressing

## 2023-07-29 NOTE — Progress Notes (Signed)
 Physical Therapy Treatment Patient Details Name: Natasha Davis MRN: [id number] DOB: 1955-09-19 Today's Date: 07/29/2023   History of Present Illness Pt is a 68 y.o. F who presents 07/22/2023 with worsening headache. On presentation, CT head showed acute bilateral subdural hematoma with some shift. S/p bilateral MMA embolization 7/1. PMH includes: CVA (admission 6/23-6/25/2025 for breakthrough seizure and acute CVA), HTN, seizure.    PT Comments  Pt received in supine and agreeable to session with encouragement. Pt requires cues for problem solving and L side awareness throughout session. Pt able to tolerate increased gait distance with RW support this session, however requires cues and assist for obstacle negotiation on L side. Pt requests to use the bathroom at the end of the session and is able to void and perform pericare without assist. Pt continues to benefit from PT services to progress toward functional mobility goals.    If plan is discharge home, recommend the following: A little help with walking and/or transfers;A little help with bathing/dressing/bathroom;Assistance with cooking/housework;Help with stairs or ramp for entrance;Assist for transportation;Direct supervision/assist for financial management;Direct supervision/assist for medications management   Can travel by private vehicle        Equipment Recommendations  Rolling walker (2 wheels)    Recommendations for Other Services       Precautions / Restrictions Precautions Precautions: Fall Recall of Precautions/Restrictions: Intact Restrictions Weight Bearing Restrictions Per Provider Order: No     Mobility  Bed Mobility Overal bed mobility: Needs Assistance Bed Mobility: Supine to Sit, Sit to Supine     Supine to sit: Contact guard, HOB elevated, Used rails Sit to supine: Min assist, HOB elevated, Used rails   General bed mobility comments: increased time and effort to sit to EOB with cues for sequencing due to  impaired problem solving. Min A for BLE elevation back to EOB and total +2 with bed pad for supine scoot towards HOB    Transfers Overall transfer level: Needs assistance Equipment used: Rolling walker (2 wheels) Transfers: Sit to/from Stand Sit to Stand: Contact guard assist                Ambulation/Gait Ambulation/Gait assistance: Min assist, Contact guard assist Gait Distance (Feet): 130 Feet Assistive device: Rolling walker (2 wheels) Gait Pattern/deviations: Step-through pattern, Decreased stride length, Drifts right/left Gait velocity: decreased     General Gait Details: Cues for RW proximity and L side awareness with intermittent min A for RW management and obstacle negotiation due to L drift   Stairs             Wheelchair Mobility     Tilt Bed    Modified Rankin (Stroke Patients Only) Modified Rankin (Stroke Patients Only) Pre-Morbid Rankin Score: No significant disability Modified Rankin: Moderately severe disability     Balance Overall balance assessment: Needs assistance Sitting-balance support: Feet supported Sitting balance-Leahy Scale: Good     Standing balance support: During functional activity, Bilateral upper extremity supported Standing balance-Leahy Scale: Poor Standing balance comment: Rw support                            Communication Communication Communication: No apparent difficulties  Cognition Arousal: Alert Behavior During Therapy: WFL for tasks assessed/performed   PT - Cognitive impairments: Memory, Awareness, Attention, Problem solving                         Following commands: Impaired Following commands  impaired: Follows one step commands with increased time, Follows multi-step commands inconsistently    Cueing Cueing Techniques: Verbal cues  Exercises      General Comments        Pertinent Vitals/Pain Pain Assessment Pain Assessment: Faces Faces Pain Scale: Hurts little  more Pain Location: headache Pain Descriptors / Indicators: Headache Pain Intervention(s): Limited activity within patient's tolerance, Monitored during session     PT Goals (current goals can now be found in the care plan section) Acute Rehab PT Goals Patient Stated Goal: to walk PT Goal Formulation: With patient Time For Goal Achievement: 08/10/23 Progress towards PT goals: Progressing toward goals    Frequency    Min 2X/week       AM-PAC PT 6 Clicks Mobility   Outcome Measure  Help needed turning from your back to your side while in a flat bed without using bedrails?: A Little Help needed moving from lying on your back to sitting on the side of a flat bed without using bedrails?: A Little Help needed moving to and from a bed to a chair (including a wheelchair)?: A Little Help needed standing up from a chair using your arms (e.g., wheelchair or bedside chair)?: A Little Help needed to walk in hospital room?: A Little Help needed climbing 3-5 steps with a railing? : A Little 6 Click Score: 18    End of Session Equipment Utilized During Treatment: Gait belt Activity Tolerance: Patient tolerated treatment well Patient left: in bed;with call bell/phone within reach;with family/visitor present Nurse Communication: Mobility status PT Visit Diagnosis: Unsteadiness on feet (R26.81)     Time: 9091-9062 PT Time Calculation (min) (ACUTE ONLY): 29 min  Charges:    $Gait Training: 8-22 mins $Therapeutic Activity: 8-22 mins PT General Charges $$ ACUTE PT VISIT: 1 Visit                     Darryle George, PTA Acute Rehabilitation Services Secure Chat Preferred  [phone]    Darryle George 07/29/2023, 10:37 AM

## 2023-07-29 NOTE — Progress Notes (Signed)
 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  Allergies  Allergen Reactions   Penicillins Other (See Comments)    Couldn't walk    Patient Measurements: Height: (P) 4' 11 (149.9 cm) Weight: (P) 96.2 kg (212 lb 1.3 oz) IBW/kg (Calculated) : (P) 43.2 HEPARIN  DW (KG): (P) 66.7  Vital Signs: Temp: 98.8 F (37.1 C) (07/04 1657) Temp Source: Oral (07/04 1657) BP: 125/62 (07/04 1657) Pulse Rate: 58 (07/04 1657)  Labs: Recent Labs    07/27/23 0442 07/28/23 0424 07/29/23 0400 07/29/23 1011 07/29/23 1902  HGB 10.7* 10.8* 10.6*  --   --   HCT 32.7* 33.8* 32.6*  --   --   PLT 260 304 310  --   --   LABPROT  --   --  15.5*  --   --   INR  --   --  1.2  --   --   HEPARINUNFRC  --   --  0.20* 0.25* 0.39  CREATININE 0.92 1.07* 0.79  --   --     Estimated Creatinine Clearance: 67.5 mL/min (by C-G formula based on SCr of 0.79 mg/dL).  Assessment: 59 YOF w/ PMH significant for PAF and mechanical MVR on Warfarin PTA. Held on admit due to SDH. S/p embolization on 07/26/23.SABRA Cleared to resume warfarin and heparin  bridge on 07/28/23 after repeat head CT.  Targeting low therapeutic heparin  levels.  PTA warfarin regimen: 5 mg daily; last dose 07/22/23 prior to admit.  Heparin  level is therapeutic (0.39) on 1250 units/hr after infusion rate increased. No infusion problems or bleeding reported.  CBC stable. INR 2.1 on admit 6/27>>down to 1.2 today.  Warfarin dose given this PM.  Goal of Therapy:  Heparin  level 0.3-0.5 units/ml INR 2.5-3.5 Monitor platelets by anticoagulation protocol: Yes   Plan:  Continue heparin  drip at 1250 units/hr Daily heparin  level, PT/INR and CBC. Monitor for signs/symptoms of bleeding.  Harlene Boga, PharmD, BCPS, BCCCP Please refer to [facility] for [facility] Pharmacy numbers 07/29/2023,8:14 PM

## 2023-07-29 NOTE — Progress Notes (Signed)
 Progress Note    Natasha Davis   FMW:[id number]  DOB: 21-Aug-1955  DOA: 07/22/2023     7 PCP: Marvine Rush, MD  Initial CC: Headache  Hospital Course: 68 y.o. female with medical history significant of seizure disorder, acute CVA (required admission from 07/18/23-07/20/23 for breakthrough seizure and acute CVA requiring increase in dose of Keppra  by neurology), history of mitral valve replacement on Coumadin , GERD, hyperlipidemia, paroxysmal atrial fibrillation, HFrEF 40 to 45% and morbid obesity presented with worsening headache.   On presentation, CT head showed acute bilateral subdural hematoma. Neurosurgery was consulted and recommended to hold Lovenox  and warfarin and recommended to repeat CT head in 8 to 10 hours.   Cardiology was also consulted. Subsequently, patient had breakthrough seizures: Keppra  dose was increased.  Neurology was consulted; LTM EEG was negative for seizures.  LTM EEG discontinued.  Neurology signed off on 07/24/2023.   Assessment & Plan:   Subdural hematoma Intractable headache - bilateral SDH noted on CT head - Evaluated by neurosurgery and ultimately underwent embolization of right and left middle meningeal artery on 07/26/2023 in efforts to alleviate time off of anticoagulation - CT head repeated 7/3 and discussed with neurosurgery.  Okay to resume heparin /Coumadin  and monitoring - Outpatient follow-up with neurosurgery planned for repeat imaging as well  History of recent acute incidental CVA - In the setting of subtherapeutic INR and the patient with mechanical mitral valve -MRI brain from 07/18/2023 noted acute/subacute cortical infarct involving left parietal lobe; chronic left thalamic infarct also noted -Coumadin  resumed 07/28/2023 with heparin  bridge - Continue statin.    Breakthrough seizure Hx seizure disorder - breakthru seizures in the hospital on 07/23/2023 - Neurology was consulted.  Keppra  dose has been increased to 750 mg BID. LTM EEG was  negative for seizures.  LTM EEG discontinued - Neurology signed off on 07/24/2023. Seizure precaution.  Outpatient follow with neurology    Persistent A-fib Prolonged QT interval - Currently rate controlled.  Continue metoprolol  -Coumadin  resumed 07/28/2023 with heparin  bridge - Cardiology following  HLD - continue statin  Normocytic anemia -Prior baseline around 11 to 12 g/dL -Remaining stable and near baseline   Hx Mechanical MVR - for hx mitral stenosis - Coumadin  resumed 07/28/2023 with heparin  bridge   Chronic combined systolic and diastolic CHF Hypertension - Currently compensated - GDMT per cardiology   Elevated LFTs --questionable cause.  Mild.  Improving monitor.   Hyponatremia - stable - trend   Leukocytosis - Possibly reactive.  Improving.  Monitor   Morbid obesity - Complicates overall prognosis and care - Body mass index is 42.84 kg/m (pended).  Interval History:  No events overnight.  Headache slowly improving but still present.  Sore from being in bed.  Asking if okay to shower.  Shower stool ordered to help facilitate this. Family present bedside.  Tolerating Coumadin  and heparin  after being resumed yesterday.   Old records reviewed in assessment of this patient  Antimicrobials:   DVT prophylaxis:  SCDs Start: 07/22/23 2301 Place TED hose Start: 07/22/23 2301 warfarin (COUMADIN ) tablet 5 mg   Code Status:   Code Status: Full Code  Mobility Assessment (Last 72 Hours)     Mobility Assessment     Row Name 07/29/23 1036 07/29/23 0800 07/28/23 2000 07/28/23 1750 07/28/23 0800   Does patient have an order for bedrest or is patient medically unstable -- No - Continue assessment No - Continue assessment -- No - Continue assessment   What is the highest level of  mobility based on the progressive mobility assessment? Level 5 (Walks with assist in room/hall) - Balance while stepping forward/back and can walk in room with assist - Complete Level 5 (Walks  with assist in room/hall) - Balance while stepping forward/back and can walk in room with assist - Complete Level 4 (Walks with assist in room) - Balance while marching in place and cannot step forward and back - Complete Level 5 (Walks with assist in room/hall) - Balance while stepping forward/back and can walk in room with assist - Complete Level 5 (Walks with assist in room/hall) - Balance while stepping forward/back and can walk in room with assist - Complete   Is the above level different from baseline mobility prior to current illness? -- Yes - Recommend PT order Yes - Recommend PT order -- Yes - Recommend PT order    Row Name 07/27/23 2150 07/27/23 1600 07/27/23 0945 07/26/23 2100     Does patient have an order for bedrest or is patient medically unstable No - Continue assessment -- No - Continue assessment No - Continue assessment    What is the highest level of mobility based on the progressive mobility assessment? Level 5 (Walks with assist in room/hall) - Balance while stepping forward/back and can walk in room with assist - Complete Level 5 (Walks with assist in room/hall) - Balance while stepping forward/back and can walk in room with assist - Complete Level 5 (Walks with assist in room/hall) - Balance while stepping forward/back and can walk in room with assist - Complete Level 5 (Walks with assist in room/hall) - Balance while stepping forward/back and can walk in room with assist - Complete    Is the above level different from baseline mobility prior to current illness? Yes - Recommend PT order -- Yes - Recommend PT order Yes - Recommend PT order       Barriers to discharge: None Disposition Plan: None HH orders placed: TBD Status is: Inpatient  Objective: Blood pressure (!) 144/59, pulse 79, temperature 99.3 F (37.4 C), temperature source Oral, resp. rate 16, height (P) 4' 11 (1.499 m), weight (P) 96.2 kg, SpO2 98%.  Examination:  Physical Exam Constitutional:       Appearance: Normal appearance.  HENT:     Head: Normocephalic and atraumatic.     Mouth/Throat:     Mouth: Mucous membranes are moist.  Eyes:     Extraocular Movements: Extraocular movements intact.  Cardiovascular:     Rate and Rhythm: Normal rate and regular rhythm.  Pulmonary:     Effort: Pulmonary effort is normal. No respiratory distress.     Breath sounds: Normal breath sounds. No wheezing.  Abdominal:     General: Bowel sounds are normal. There is no distension.     Palpations: Abdomen is soft.     Tenderness: There is no abdominal tenderness.  Musculoskeletal:        General: Normal range of motion.     Cervical back: Normal range of motion and neck supple.  Skin:    General: Skin is warm and dry.  Neurological:     Mental Status: She is alert.     Comments:  4/5 strength in LUE/LLE and paresthesias in both left upper and lower extremities per patient  Psychiatric:        Mood and Affect: Mood normal.      Consultants:  Neurosurgery  Procedures:  07/26/2023 PROCEDURE: Onyx embolization of right middle meningeal artery  Onyx embolization of left middle  meningeal artery   Data Reviewed: Results for orders placed or performed during the hospital encounter of 07/22/23 (from the past 24 hours)  Basic metabolic panel with GFR     Status: Abnormal   Collection Time: 07/29/23  4:00 AM  Result Value Ref Range   Sodium 129 (L) 135 - 145 mmol/L   Potassium 4.4 3.5 - 5.1 mmol/L   Chloride 93 (L) 98 - 111 mmol/L   CO2 24 22 - 32 mmol/L   Glucose, Bld 125 (H) 70 - 99 mg/dL   BUN 18 8 - 23 mg/dL   Creatinine, Ser 9.20 0.44 - 1.00 mg/dL   Calcium  8.7 (L) 8.9 - 10.3 mg/dL   GFR, Estimated >39 >39 mL/min   Anion gap 12 5 - 15  CBC with Differential/Platelet     Status: Abnormal   Collection Time: 07/29/23  4:00 AM  Result Value Ref Range   WBC 14.6 (H) 4.0 - 10.5 K/uL   RBC 3.44 (L) 3.87 - 5.11 MIL/uL   Hemoglobin 10.6 (L) 12.0 - 15.0 g/dL   HCT 67.3 (L) 63.9 - 53.9 %    MCV 94.8 80.0 - 100.0 fL   MCH 30.8 26.0 - 34.0 pg   MCHC 32.5 30.0 - 36.0 g/dL   RDW 87.2 88.4 - 84.4 %   Platelets 310 150 - 400 K/uL   nRBC 0.0 0.0 - 0.2 %   Neutrophils Relative % 77 %   Neutro Abs 11.3 (H) 1.7 - 7.7 K/uL   Lymphocytes Relative 11 %   Lymphs Abs 1.6 0.7 - 4.0 K/uL   Monocytes Relative 11 %   Monocytes Absolute 1.6 (H) 0.1 - 1.0 K/uL   Eosinophils Relative 0 %   Eosinophils Absolute 0.1 0.0 - 0.5 K/uL   Basophils Relative 0 %   Basophils Absolute 0.0 0.0 - 0.1 K/uL   Immature Granulocytes 1 %   Abs Immature Granulocytes 0.11 (H) 0.00 - 0.07 K/uL  Magnesium      Status: None   Collection Time: 07/29/23  4:00 AM  Result Value Ref Range   Magnesium  2.0 1.7 - 2.4 mg/dL  Protime-INR     Status: Abnormal   Collection Time: 07/29/23  4:00 AM  Result Value Ref Range   Prothrombin Time 15.5 (H) 11.4 - 15.2 seconds   INR 1.2 0.8 - 1.2  Heparin  level (unfractionated)     Status: Abnormal   Collection Time: 07/29/23  4:00 AM  Result Value Ref Range   Heparin  Unfractionated 0.20 (L) 0.30 - 0.70 IU/mL  Heparin  level (unfractionated)     Status: Abnormal   Collection Time: 07/29/23 10:11 AM  Result Value Ref Range   Heparin  Unfractionated 0.25 (L) 0.30 - 0.70 IU/mL    I have reviewed pertinent nursing notes, vitals, labs, and images as necessary. I have ordered labwork to follow up on as indicated.  I have reviewed the last notes from staff over past 24 hours. I have discussed patient's care plan and test results with nursing staff, CM/SW, and other staff as appropriate.  Time spent: Greater than 50% of the 55 minute visit was spent in counseling/coordination of care for the patient as laid out in the A&P.   LOS: 7 days   Alm Apo, MD [facility] 07/29/2023, 2:28 PM

## 2023-07-29 NOTE — Progress Notes (Signed)
 PHARMACY - ANTICOAGULATION CONSULT NOTE  Pharmacy Consult for heparin  Indication: MVR/PAF in setting of SDH  Labs: Recent Labs    07/27/23 0442 07/28/23 0424 07/29/23 0400  HGB 10.7* 10.8* 10.6*  HCT 32.7* 33.8* 32.6*  PLT 260 304 310  LABPROT  --   --  15.5*  INR  --   --  1.2  HEPARINUNFRC  --   --  0.20*  CREATININE 0.92 1.07* 0.79   Assessment: 67yo female subtherapeutic on heparin  with initial dosing for MVR/PAF in setting of SDH, now s/p embolization and ok'd by NS to resume AC; no infusion issues or signs of bleeding per RN.  Goal of Therapy:  Heparin  level 0.3-0.5 units/ml   Plan:  Increase heparin  infusion by 2 units/kg/hr to 1100 units/hr. Check level in 6 hours.   Marvetta Dauphin, PharmD, BCPS 07/29/2023 5:13 AM

## 2023-07-29 NOTE — Plan of Care (Signed)
   Problem: Education: Goal: Knowledge of General Education information will improve Description Including pain rating scale, medication(s)/side effects and non-pharmacologic comfort measures Outcome: Progressing   Problem: Clinical Measurements: Goal: Ability to maintain clinical measurements within normal limits will improve Outcome: Progressing   Problem: Activity: Goal: Risk for activity intolerance will decrease Outcome: Progressing

## 2023-07-29 NOTE — Progress Notes (Signed)
 Cardiology Progress Note  Patient ID: Natasha Davis MRN: [id number] DOB: 1955/08/16 Date of Encounter: 07/29/2023 Primary Cardiologist: Aleene Passe, MD -> [place]   Subjective   Chief Complaint: None.   HPI: Denies chest pain or trouble breathing.  Headache better back on anticoagulation  ROS:  All other ROS reviewed and negative. Pertinent positives noted in the HPI.     Telemetry  Overnight telemetry shows A-fib heart rate 60s, which I personally reviewed.    Physical Exam   Vitals:   07/28/23 2030 07/29/23 0011 07/29/23 0351 07/29/23 0820  BP: 128/67 (!) 156/65 (!) 124/55 (!) 144/59  Pulse: (!) 59 84 86 79  Resp: 18   16  Temp: 99.1 F (37.3 C) 99.2 F (37.3 C) 99 F (37.2 C) 99.3 F (37.4 C)  TempSrc: Oral Oral Oral Oral  SpO2: 100% 99% 98% 98%  Weight:      Height:        Intake/Output Summary (Last 24 hours) at 07/29/2023 0939 Last data filed at 07/29/2023 0400 Gross per 24 hour  Intake 121.51 ml  Output --  Net 121.51 ml        07/26/2023   11:07 AM 07/22/2023    7:35 PM 07/21/2023   10:41 AM  Last 3 Weights  Weight (lbs) 212 lb 1.3 oz 212 lb 1.3 oz 212 lb  Weight (kg) 96.2 kg 96.2 kg 96.163 kg    Body mass index is 42.84 kg/m (pended).   Affect appropriate Healthy:  appears stated age HEENT: normal Neck supple with no adenopathy JVP normal no bruits no thyromegaly Lungs clear with no wheezing and good diaphragmatic motion Heart:  S1 click /S2 no murmur, no rub, gallop or click PMI normal Abdomen: benighn, BS positve, no tenderness, no AAA no bruit.  No HSM or HJR Distal pulses intact with no bruits No edema Neuro mild left sided weakness chronic  Skin warm and dry No muscular weakness   Cardiac Studies  TTE 07/19/2023  1. Left ventricular ejection fraction, by estimation, is 40 to 45%. The  left ventricle has mildly decreased function. The left ventricle  demonstrates global hypokinesis. There is moderate concentric left  ventricular  hypertrophy. Left ventricular  diastolic parameters are indeterminate.   2. Right ventricular systolic function is mildly reduced. The right  ventricular size is normal. There is normal pulmonary artery systolic  pressure.   3. Left atrial size was severely dilated.   4. The mitral valve has been repaired/replaced. No evidence of mitral  valve regurgitation. The mean mitral valve gradient is 4.3 mmHg.   5. The aortic valve is tricuspid. There is mild calcification of the  aortic valve. Aortic valve regurgitation is not visualized. Aortic valve  sclerosis is present, with no evidence of aortic valve stenosis.   6. The inferior vena cava is normal in size with <50% respiratory  variability, suggesting right atrial pressure of 8 mmHg.   Patient Profile  Natasha Davis is a 68 y.o. female with mechanical mitral valve, persistent atrial fibrillation, systolic heart failure (EF 40-45%), stroke, hypertension admitted on 07/22/2023 with subdural hematomas.  Cardiology consulted for anticoagulation management in the setting of mechanical mitral valve.  Assessment & Plan   # Bilateral subdural hematomas # Mechanical mitral valve -  coumadin  held with subdural hematomas - MMA embolization 07/26/23 went well  - F/U CT still with 10 mm right subdural and less on left - Mean gradient across MV 4.3 mmHg with normal function on TTE  07/19/23 - heparin  /coumadin  resumed 07/28/23   # Recent L Parietal CVA - Admitted earlier last week with acute stroke.  - INR sub Rx - at risk with mechanical valve and chronic afib off anticoagulation   # Chronic systolic heart failure, EF 40-45% - No signs of volume overload.  Continue Lasix  40 mg daily. - Stop metoprolol  to tartrate.  Transition to metoprolol  succinate 25 mg daily. - Stop triamterene  HCTZ.  Start losartan  50 mg daily.  Add Aldactone  25 mg daily.  No SGLT2 with obesity  # Persistent atrial fibrillation - Rate controlled on metoprolol .    Pharmacy  following can d/c heparin  when INR > 2.2 Normal  Home dose coumadin  is 5 mg daily      For questions or updates, please contact [facility] Please consult [email or web address] for contact info under    Maude Emmer MD [facility]

## 2023-07-29 NOTE — Progress Notes (Signed)
 Patient ID: Natasha Davis, female   DOB: Aug 21, 1955, 68 y.o.   MRN: [id number] Vital signs are stable.  Patient is awake and alert and ambulating.  Awaiting disposition.

## 2023-07-30 DIAGNOSIS — I5022 Chronic systolic (congestive) heart failure: Secondary | ICD-10-CM

## 2023-07-30 DIAGNOSIS — Z952 Presence of prosthetic heart valve: Secondary | ICD-10-CM | POA: Diagnosis not present

## 2023-07-30 DIAGNOSIS — I480 Paroxysmal atrial fibrillation: Secondary | ICD-10-CM | POA: Diagnosis not present

## 2023-07-30 DIAGNOSIS — I4819 Other persistent atrial fibrillation: Secondary | ICD-10-CM | POA: Diagnosis not present

## 2023-07-30 DIAGNOSIS — S065XAA Traumatic subdural hemorrhage with loss of consciousness status unknown, initial encounter: Secondary | ICD-10-CM | POA: Diagnosis not present

## 2023-07-30 LAB — BASIC METABOLIC PANEL WITH GFR
Anion gap: 9 (ref 5–15)
BUN: 14 mg/dL (ref 8–23)
CO2: 26 mmol/L (ref 22–32)
Calcium: 8.8 mg/dL — ABNORMAL LOW (ref 8.9–10.3)
Chloride: 94 mmol/L — ABNORMAL LOW (ref 98–111)
Creatinine, Ser: 0.81 mg/dL (ref 0.44–1.00)
GFR, Estimated: >60 mL/min (ref >60)
Glucose, Bld: 127 mg/dL — ABNORMAL HIGH (ref 70–99)
Potassium: 4.4 mmol/L (ref 3.5–5.1)
Sodium: 129 mmol/L — ABNORMAL LOW (ref 135–145)

## 2023-07-30 LAB — CBC WITH DIFFERENTIAL/PLATELET
Abs Immature Granulocytes: 0.12 K/uL — ABNORMAL HIGH (ref 0.00–0.07)
Basophils Absolute: 0 K/uL (ref 0.0–0.1)
Basophils Relative: 0 %
Eosinophils Absolute: 0.1 K/uL (ref 0.0–0.5)
Eosinophils Relative: 1 %
HCT: 29.1 % — ABNORMAL LOW (ref 36.0–46.0)
Hemoglobin: 9.6 g/dL — ABNORMAL LOW (ref 12.0–15.0)
Immature Granulocytes: 1 %
Lymphocytes Relative: 11 %
Lymphs Abs: 1.4 K/uL (ref 0.7–4.0)
MCH: 30.7 pg (ref 26.0–34.0)
MCHC: 33 g/dL (ref 30.0–36.0)
MCV: 93 fL (ref 80.0–100.0)
Monocytes Absolute: 1.3 K/uL — ABNORMAL HIGH (ref 0.1–1.0)
Monocytes Relative: 10 %
Neutro Abs: 9.9 K/uL — ABNORMAL HIGH (ref 1.7–7.7)
Neutrophils Relative %: 77 %
Platelets: 339 K/uL (ref 150–400)
RBC: 3.13 MIL/uL — ABNORMAL LOW (ref 3.87–5.11)
RDW: 12.9 % (ref 11.5–15.5)
WBC: 12.8 K/uL — ABNORMAL HIGH (ref 4.0–10.5)
nRBC: 0 % (ref 0.0–0.2)

## 2023-07-30 LAB — HEPATIC FUNCTION PANEL
ALT: 62 U/L — ABNORMAL HIGH (ref 0–44)
AST: 40 U/L (ref 15–41)
Albumin: 2.9 g/dL — ABNORMAL LOW (ref 3.5–5.0)
Alkaline Phosphatase: 95 U/L (ref 38–126)
Bilirubin, Direct: 0.3 mg/dL — ABNORMAL HIGH (ref 0.0–0.2)
Indirect Bilirubin: 0.6 mg/dL (ref 0.3–0.9)
Total Bilirubin: 0.9 mg/dL (ref 0.0–1.2)
Total Protein: 6.5 g/dL (ref 6.5–8.1)

## 2023-07-30 LAB — HEPARIN LEVEL (UNFRACTIONATED): Heparin Unfractionated: 0.49 [IU]/mL (ref 0.30–0.70)

## 2023-07-30 LAB — PROTIME-INR
INR: 1.2 (ref 0.8–1.2)
Prothrombin Time: 15.3 s — ABNORMAL HIGH (ref 11.4–15.2)

## 2023-07-30 LAB — MAGNESIUM: Magnesium: 1.9 mg/dL (ref 1.7–2.4)

## 2023-07-30 MED ORDER — WARFARIN SODIUM 7.5 MG PO TABS
7.5000 mg | ORAL_TABLET | Freq: Once | ORAL | Status: AC
  Administered 2023-07-30: 7.5 mg via ORAL
  Filled 2023-07-30: qty 1

## 2023-07-30 NOTE — Progress Notes (Signed)
 Physical Therapy Treatment Patient Details Name: Natasha Davis MRN: [id number] DOB: [birth year] Today's Date: 07/30/2023   History of Present Illness Pt is a 68 y.o. F who presents 07/22/2023 with worsening headache. On presentation, CT head showed acute bilateral subdural hematoma with some shift. S/p bilateral MMA embolization 7/1. PMH includes: CVA (admission 6/23-6/25/2025 for breakthrough seizure and acute CVA), HTN, seizure.    PT Comments  Pt received in the bathroom and agreeable to session. Pt reports feeling better today and improved sleep last night.  Pt able to tolerate increased gait distance this session, however is noticeably fatigued after. Pt continues to demonstrate decreased L awareness and drift despite frequent cues. Suspect L drift is related to LUE weakness and R elbow extension on RW, however pt demonstrates no carryover of cues for improved positioning requiring intermittent assist for RW management. Pt continues to benefit from PT services to progress toward functional mobility goals.    If plan is discharge home, recommend the following: A little help with walking and/or transfers;A little help with bathing/dressing/bathroom;Assistance with cooking/housework;Help with stairs or ramp for entrance;Assist for transportation;Direct supervision/assist for financial management;Direct supervision/assist for medications management   Can travel by private vehicle        Equipment Recommendations  Rolling walker (2 wheels)    Recommendations for Other Services       Precautions / Restrictions Precautions Precautions: Fall Recall of Precautions/Restrictions: Intact Restrictions Weight Bearing Restrictions Per Provider Order: No     Mobility  Bed Mobility Overal bed mobility: Needs Assistance Bed Mobility: Sit to Supine       Sit to supine: HOB elevated, Used rails, Contact guard assist   General bed mobility comments: increased time and effort     Transfers Overall transfer level: Needs assistance Equipment used: Rolling walker (2 wheels) Transfers: Sit to/from Stand Sit to Stand: Contact guard assist           General transfer comment: from low toilet with CGA for safety    Ambulation/Gait Ambulation/Gait assistance: Min assist, Contact guard assist Gait Distance (Feet): 200 Feet Assistive device: Rolling walker (2 wheels) Gait Pattern/deviations: Step-through pattern, Decreased stride length, Drifts right/left Gait velocity: decreased     General Gait Details: Cues for RW proximity and L side awareness with intermittent min A for RW management and obstacle negotiation due to L drift. Cues for UE positioning to reduce L drift, but no carryover   Stairs             Wheelchair Mobility     Tilt Bed    Modified Rankin (Stroke Patients Only) Modified Rankin (Stroke Patients Only) Pre-Morbid Rankin Score: No significant disability Modified Rankin: Moderately severe disability     Balance Overall balance assessment: Needs assistance Sitting-balance support: Feet supported Sitting balance-Leahy Scale: Good     Standing balance support: During functional activity, Bilateral upper extremity supported Standing balance-Leahy Scale: Poor Standing balance comment: Rw support                            Communication Communication Communication: No apparent difficulties  Cognition Arousal: Alert Behavior During Therapy: WFL for tasks assessed/performed   PT - Cognitive impairments: Memory, Awareness, Attention, Problem solving                         Following commands: Impaired Following commands impaired: Follows one step commands with increased time, Follows multi-step commands inconsistently  Cueing Cueing Techniques: Verbal cues  Exercises      General Comments        Pertinent Vitals/Pain Pain Assessment Pain Assessment: No/denies pain     PT Goals (current  goals can now be found in the care plan section) Acute Rehab PT Goals Patient Stated Goal: to walk PT Goal Formulation: With patient Time For Goal Achievement: 08/10/23 Progress towards PT goals: Progressing toward goals    Frequency    Min 2X/week       AM-PAC PT 6 Clicks Mobility   Outcome Measure  Help needed turning from your back to your side while in a flat bed without using bedrails?: A Little Help needed moving from lying on your back to sitting on the side of a flat bed without using bedrails?: A Little Help needed moving to and from a bed to a chair (including a wheelchair)?: A Little Help needed standing up from a chair using your arms (e.g., wheelchair or bedside chair)?: A Little Help needed to walk in hospital room?: A Little Help needed climbing 3-5 steps with a railing? : A Little 6 Click Score: 18    End of Session   Activity Tolerance: Patient tolerated treatment well Patient left: in bed;with call bell/phone within reach;with bed alarm set Nurse Communication: Mobility status PT Visit Diagnosis: Unsteadiness on feet (R26.81)     Time: 8953-8895 PT Time Calculation (min) (ACUTE ONLY): 18 min  Charges:    $Gait Training: 8-22 mins PT General Charges $$ ACUTE PT VISIT: 1 Visit                     Darryle George, PTA Acute Rehabilitation Services Secure Chat Preferred  [phone]    Darryle George 07/30/2023, 11:52 AM

## 2023-07-30 NOTE — Progress Notes (Signed)
 Progress Note  Patient Name: Natasha Davis Date of Encounter: 07/30/2023  Primary Cardiologist: Aleene Passe, MD  Interval Summary  Chart reviewed.  Patient states she has had some upper chest congestion, nonproductive cough.  No chest pain or palpitations.  Vital Signs  Vitals:   07/29/23 2032 07/29/23 2354 07/30/23 0406 07/30/23 0528  BP: (!) 168/73 (!) 127/52 (!) 146/67   Pulse: 63 (!) 56 71   Resp:  16 18   Temp: 99.5 F (37.5 C) 97.6 F (36.4 C) 97.6 F (36.4 C)   TempSrc: Oral Oral Oral   SpO2: 100% 100% 99%   Weight:    99.3 kg  Height:        Intake/Output Summary (Last 24 hours) at 07/30/2023 9061 Last data filed at 07/30/2023 0700 Gross per 24 hour  Intake 318.15 ml  Output --  Net 318.15 ml   Filed Weights   07/22/23 1935 07/26/23 1107 07/30/23 0528  Weight: 96.2 kg (P) 96.2 kg 99.3 kg    Physical Exam  GEN: No acute distress.   Neck: No JVD. Cardiac: Irregularly irregular with prosthetic click in S1, no gallop.SABRA  Respiratory: Nonlabored. Clear to auscultation bilaterally. GI: Soft, nontender, bowel sounds present. MS: No edema. Neuro: Mild left-sided weakness.  ECG/Telemetry  Telemetry reviewed showing rate controlled atrial fibrillation.  Burst of NSVT.  Labs  Chemistry Recent Labs  Lab 07/26/23 0451 07/27/23 0442 07/28/23 0424 07/29/23 0400 07/30/23 0609  NA 131* 128* 131* 129* 129*  K 3.9 4.5 3.9 4.4 4.4  CL 92* 96* 95* 93* 94*  CO2 29 22 25 24 26   GLUCOSE 118* 138* 98 125* 127*  BUN 17 21 21 18 14   CREATININE 0.89 0.92 1.07* 0.79 0.81  CALCIUM  9.2 8.1* 8.8* 8.7* 8.8*  PROT 8.0 7.0  --   --  6.5  ALBUMIN 3.6 3.0*  --   --  2.9*  AST 42* 42*  --   --  40  ALT 57* 50*  --   --  62*  ALKPHOS 97 106  --   --  95  BILITOT 1.1 1.0  --   --  0.9  GFRNONAA >60 >60 56* >60 >60  ANIONGAP 10 10 11 12 9     Hematology Recent Labs  Lab 07/28/23 0424 07/29/23 0400 07/30/23 0609  WBC 12.5* 14.6* 12.8*  RBC 3.58* 3.44* 3.13*   HGB 10.8* 10.6* 9.6*  HCT 33.8* 32.6* 29.1*  MCV 94.4 94.8 93.0  MCH 30.2 30.8 30.7  MCHC 32.0 32.5 33.0  RDW 12.8 12.7 12.9  PLT 304 310 339    Lipid Panel     Component Value Date/Time   CHOL 102 07/19/2023 0443   CHOL 146 03/26/2016 1615   TRIG 57 07/19/2023 0443   HDL 39 (L) 07/19/2023 0443   HDL 50 03/26/2016 1615   CHOLHDL 2.6 07/19/2023 0443   VLDL 11 07/19/2023 0443   LDLCALC 52 07/19/2023 0443   LDLCALC 77 03/26/2016 1615   LDLDIRECT 147.2 03/22/2011 1619   LABVLDL 19 03/26/2016 1615    Cardiac Studies  Echocardiogram 07/19/2023:  1. Left ventricular ejection fraction, by estimation, is 40 to 45%. The  left ventricle has mildly decreased function. The left ventricle  demonstrates global hypokinesis. There is moderate concentric left  ventricular hypertrophy. Left ventricular  diastolic parameters are indeterminate.   2. Right ventricular systolic function is mildly reduced. The right  ventricular size is normal. There is normal pulmonary artery systolic  pressure.  3. Left atrial size was severely dilated.   4. The mitral valve has been repaired/replaced. No evidence of mitral  valve regurgitation. The mean mitral valve gradient is 4.3 mmHg.   5. The aortic valve is tricuspid. There is mild calcification of the  aortic valve. Aortic valve regurgitation is not visualized. Aortic valve  sclerosis is present, with no evidence of aortic valve stenosis.   6. The inferior vena cava is normal in size with <50% respiratory  variability, suggesting right atrial pressure of 8 mmHg.   Assessment & Plan  1.  Subdural hematomas now status post middle meningeal artery embolization and doing well.  2, 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  Currently on IV heparin  following resumption of Coumadin  on July 3, INR not yet therapeutic.  3.  Persistent atrial fibrillation, heart rate controlled.  CHA2DS2-VASc score 6.  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  Current GDMT includes Cozaar  50 mg daily, Toprol  XL 25 mg daily, and Aldactone  25 mg daily.  No change in medications at this time.  Continue IV heparin  and Coumadin  per pharmacy, INR is 1.2 today.  For questions or updates, please contact [facility] Please consult [email or web address] for contact info under   Signed, Jayson Sierras, MD  07/30/2023, 9:38 AM

## 2023-07-30 NOTE — Progress Notes (Signed)
 Progress Note    Natasha Davis   FMW:[id number]  DOB: 07-21-1955  DOA: 07/22/2023     8 PCP: Marvine Rush, MD  Initial CC: Headache  Hospital Course: 68 y.o. female with medical history significant of seizure disorder, acute CVA (required admission from 07/18/23-07/20/23 for breakthrough seizure and acute CVA requiring increase in dose of Keppra  by neurology), history of mitral valve replacement on Coumadin , GERD, hyperlipidemia, paroxysmal atrial fibrillation, HFrEF 40 to 45% and morbid obesity presented with worsening headache.   On presentation, CT head showed acute bilateral subdural hematoma. Neurosurgery was consulted and recommended to hold Lovenox  and warfarin and recommended to repeat CT head in 8 to 10 hours.   Cardiology was also consulted. Subsequently, patient had breakthrough seizures: Keppra  dose was increased.  Neurology was consulted; LTM EEG was negative for seizures.  LTM EEG discontinued.  Neurology signed off on 07/24/2023.   Assessment & Plan:   Subdural hematoma Intractable headache - bilateral SDH noted on CT head - Evaluated by neurosurgery and ultimately underwent embolization of right and left middle meningeal artery on 07/26/2023 in efforts to alleviate time off of anticoagulation - CT head repeated 7/3 and discussed with neurosurgery.  Okay to resume heparin /Coumadin  and monitoring - Outpatient follow-up with neurosurgery planned for repeat imaging as well  History of recent acute incidental CVA - In the setting of subtherapeutic INR and the patient with mechanical mitral valve -MRI brain from 07/18/2023 noted acute/subacute cortical infarct involving left parietal lobe; chronic left thalamic infarct also noted -Coumadin  resumed 07/28/2023 with heparin  bridge - Continue statin   Breakthrough seizure Hx seizure disorder - breakthru seizures in the hospital on 07/23/2023 - Neurology was consulted.  Keppra  dose has been increased to 750 mg BID. LTM EEG was  negative for seizures.  LTM EEG discontinued - Neurology signed off on 07/24/2023. Seizure precaution.  Outpatient follow with neurology    Persistent A-fib Prolonged QT interval - Currently rate controlled.  Continue metoprolol  -Coumadin  resumed 07/28/2023 with heparin  bridge - Cardiology following  Hx Mechanical MVR - for hx mitral stenosis - Coumadin  resumed 07/28/2023 with heparin  bridge  HLD - continue statin  Normocytic anemia -Prior baseline around 11 to 12 g/dL -Remaining stable and near baseline   Chronic combined systolic and diastolic CHF Hypertension - Currently compensated - GDMT per cardiology   Elevated LFTs --questionable cause.  Mild.  Improving    Hyponatremia - stable - trend   Leukocytosis - Possibly reactive.  Improving.  Monitor   Morbid obesity - Complicates overall prognosis and care - Body mass index is 42.84 kg/m (pended).  Interval History:  No events overnight.  Headache slowly improving but still present.  Ambulating in the hall with therapy this morning.   Old records reviewed in assessment of this patient  Antimicrobials:   DVT prophylaxis:  SCDs Start: 07/22/23 2301 Place TED hose Start: 07/22/23 2301   Code Status:   Code Status: Full Code  Mobility Assessment (Last 72 Hours)     Mobility Assessment     Row Name 07/30/23 1150 07/30/23 0800 07/29/23 1950 07/29/23 1036 07/29/23 0800   Does patient have an order for bedrest or is patient medically unstable -- No - Continue assessment No - Continue assessment -- No - Continue assessment   What is the highest level of mobility based on the progressive mobility assessment? Level 5 (Walks with assist in room/hall) - Balance while stepping forward/back and can walk in room with assist - Complete Level 4 (  Walks with assist in room) - Balance while marching in place and cannot step forward and back - Complete Level 4 (Walks with assist in room) - Balance while marching in place and  cannot step forward and back - Complete Level 5 (Walks with assist in room/hall) - Balance while stepping forward/back and can walk in room with assist - Complete Level 5 (Walks with assist in room/hall) - Balance while stepping forward/back and can walk in room with assist - Complete   Is the above level different from baseline mobility prior to current illness? -- Yes - Recommend PT order Yes - Recommend PT order -- Yes - Recommend PT order    Row Name 07/28/23 2000 07/28/23 1750 07/28/23 0800 07/27/23 2150     Does patient have an order for bedrest or is patient medically unstable No - Continue assessment -- No - Continue assessment No - Continue assessment    What is the highest level of mobility based on the progressive mobility assessment? Level 4 (Walks with assist in room) - Balance while marching in place and cannot step forward and back - Complete Level 5 (Walks with assist in room/hall) - Balance while stepping forward/back and can walk in room with assist - Complete Level 5 (Walks with assist in room/hall) - Balance while stepping forward/back and can walk in room with assist - Complete Level 5 (Walks with assist in room/hall) - Balance while stepping forward/back and can walk in room with assist - Complete    Is the above level different from baseline mobility prior to current illness? Yes - Recommend PT order -- Yes - Recommend PT order Yes - Recommend PT order       Barriers to discharge: None Disposition Plan: None HH orders placed: TBD Status is: Inpatient  Objective: Blood pressure (!) 146/71, pulse 66, temperature 98.2 F (36.8 C), temperature source Oral, resp. rate 18, height (P) 4' 11 (1.499 m), weight 99.3 kg, SpO2 99%.  Examination:  Physical Exam Constitutional:      Appearance: Normal appearance.  HENT:     Head: Normocephalic and atraumatic.     Mouth/Throat:     Mouth: Mucous membranes are moist.  Eyes:     Extraocular Movements: Extraocular movements intact.   Cardiovascular:     Rate and Rhythm: Normal rate and regular rhythm.  Pulmonary:     Effort: Pulmonary effort is normal. No respiratory distress.     Breath sounds: Normal breath sounds. No wheezing.  Abdominal:     General: Bowel sounds are normal. There is no distension.     Palpations: Abdomen is soft.     Tenderness: There is no abdominal tenderness.  Musculoskeletal:        General: Normal range of motion.     Cervical back: Normal range of motion and neck supple.  Skin:    General: Skin is warm and dry.  Neurological:     Mental Status: She is alert.     Comments:  4/5 strength in LUE/LLE and paresthesias in both left upper and lower extremities per patient  Psychiatric:        Mood and Affect: Mood normal.      Consultants:  Neurosurgery  Procedures:  07/26/2023 PROCEDURE: Onyx embolization of right middle meningeal artery  Onyx embolization of left middle meningeal artery   Data Reviewed: Results for orders placed or performed during the hospital encounter of 07/22/23 (from the past 24 hours)  Heparin  level (unfractionated)  Status: None   Collection Time: 07/29/23  7:02 PM  Result Value Ref Range   Heparin  Unfractionated 0.39 0.30 - 0.70 IU/mL  Basic metabolic panel with GFR     Status: Abnormal   Collection Time: 07/30/23  6:09 AM  Result Value Ref Range   Sodium 129 (L) 135 - 145 mmol/L   Potassium 4.4 3.5 - 5.1 mmol/L   Chloride 94 (L) 98 - 111 mmol/L   CO2 26 22 - 32 mmol/L   Glucose, Bld 127 (H) 70 - 99 mg/dL   BUN 14 8 - 23 mg/dL   Creatinine, Ser 9.18 0.44 - 1.00 mg/dL   Calcium  8.8 (L) 8.9 - 10.3 mg/dL   GFR, Estimated >39 >39 mL/min   Anion gap 9 5 - 15  CBC with Differential/Platelet     Status: Abnormal   Collection Time: 07/30/23  6:09 AM  Result Value Ref Range   WBC 12.8 (H) 4.0 - 10.5 K/uL   RBC 3.13 (L) 3.87 - 5.11 MIL/uL   Hemoglobin 9.6 (L) 12.0 - 15.0 g/dL   HCT 70.8 (L) 63.9 - 53.9 %   MCV 93.0 80.0 - 100.0 fL   MCH 30.7 26.0 -  34.0 pg   MCHC 33.0 30.0 - 36.0 g/dL   RDW 87.0 88.4 - 84.4 %   Platelets 339 150 - 400 K/uL   nRBC 0.0 0.0 - 0.2 %   Neutrophils Relative % 77 %   Neutro Abs 9.9 (H) 1.7 - 7.7 K/uL   Lymphocytes Relative 11 %   Lymphs Abs 1.4 0.7 - 4.0 K/uL   Monocytes Relative 10 %   Monocytes Absolute 1.3 (H) 0.1 - 1.0 K/uL   Eosinophils Relative 1 %   Eosinophils Absolute 0.1 0.0 - 0.5 K/uL   Basophils Relative 0 %   Basophils Absolute 0.0 0.0 - 0.1 K/uL   Immature Granulocytes 1 %   Abs Immature Granulocytes 0.12 (H) 0.00 - 0.07 K/uL  Magnesium      Status: None   Collection Time: 07/30/23  6:09 AM  Result Value Ref Range   Magnesium  1.9 1.7 - 2.4 mg/dL  Protime-INR     Status: Abnormal   Collection Time: 07/30/23  6:09 AM  Result Value Ref Range   Prothrombin Time 15.3 (H) 11.4 - 15.2 seconds   INR 1.2 0.8 - 1.2  Hepatic function panel     Status: Abnormal   Collection Time: 07/30/23  6:09 AM  Result Value Ref Range   Total Protein 6.5 6.5 - 8.1 g/dL   Albumin 2.9 (L) 3.5 - 5.0 g/dL   AST 40 15 - 41 U/L   ALT 62 (H) 0 - 44 U/L   Alkaline Phosphatase 95 38 - 126 U/L   Total Bilirubin 0.9 0.0 - 1.2 mg/dL   Bilirubin, Direct 0.3 (H) 0.0 - 0.2 mg/dL   Indirect Bilirubin 0.6 0.3 - 0.9 mg/dL  Heparin  level (unfractionated)     Status: None   Collection Time: 07/30/23  6:10 AM  Result Value Ref Range   Heparin  Unfractionated 0.49 0.30 - 0.70 IU/mL    I have reviewed pertinent nursing notes, vitals, labs, and images as necessary. I have ordered labwork to follow up on as indicated.  I have reviewed the last notes from staff over past 24 hours. I have discussed patient's care plan and test results with nursing staff, CM/SW, and other staff as appropriate.  Time spent: Greater than 50% of the 55 minute visit was spent  in counseling/coordination of care for the patient as laid out in the A&P.   LOS: 8 days   Alm Apo, MD [facility] 07/30/2023, 4:18 PM

## 2023-07-30 NOTE — Progress Notes (Signed)
 Subjective: The patient is alert and pleasant.  She looks and feels well.  Her daughter is at the bedside.  Objective: Vital signs in last 24 hours: Temp:  [97.6 F (36.4 C)-99.5 F (37.5 C)] 97.6 F (36.4 C) (07/05 0406) Pulse Rate:  [56-71] 71 (07/05 0406) Resp:  [14-18] 18 (07/05 0406) BP: (125-168)/(52-73) 146/67 (07/05 0406) SpO2:  [99 %-100 %] 99 % (07/05 0406) Weight:  [99.3 kg] 99.3 kg (07/05 0528) Estimated body mass index is 44.22 kg/m (pended) as calculated from the following:   Height as of this encounter: (P) 4' 11 (1.499 m).   Weight as of this encounter: 99.3 kg.   Intake/Output from previous day: 07/04 0701 - 07/05 0700 In: 318.2 [I.V.:318.2] Out: -  Intake/Output this shift: No intake/output data recorded.  Physical exam patient is alert oriented.  His speech and strength are normal.  Lab Results: Recent Labs    07/29/23 0400 07/30/23 0609  WBC 14.6* 12.8*  HGB 10.6* 9.6*  HCT 32.6* 29.1*  PLT 310 339   BMET Recent Labs    07/29/23 0400 07/30/23 0609  NA 129* 129*  K 4.4 4.4  CL 93* 94*  CO2 24 26  GLUCOSE 125* 127*  BUN 18 14  CREATININE 0.79 0.81  CALCIUM  8.7* 8.8*    Studies/Results: CT HEAD WO CONTRAST ( ) Result Date: 07/28/2023 CLINICAL DATA:  68 year old female with bilateral subdural hematoma. EXAM: CT HEAD WITHOUT CONTRAST TECHNIQUE: Contiguous axial images were obtained from the base of the skull through the vertex without intravenous contrast. RADIATION DOSE REDUCTION: This exam was performed according to the departmental dose-optimization program which includes automated exposure control, adjustment of the mA and/or kV according to patient size and/or use of iterative reconstruction technique. COMPARISON:  Head CT 07/23/2023 and earlier. FINDINGS: Brain: Chronic right hemisphere hypodense encephalomalacia is stable. Bilateral mixed density but frequently hyperdense subdural hematoma persists. Left side collection now measures up  to 4 mm, frequently 2 mm along the left lateral hemisphere, decreased (previously up to 6 mm). 7 mm thick blood products along the left tentorium not significantly changed. Contralateral right side subdural is superimposed on chronic encephalomalacia and remains more lobulated and mixed density. Anteriorly the collection is up to 10 mm in thickness. Lobulated right posterior convexity hyperdense component up to 11 mm is stable. More hypodense anterior right convexity component up to 10 mm now on series 5, image 51. Small posterior tentorial component is stable. But trace rightward midline shift now, no significant mass effect on the ventricles. Basilar cisterns are patent. No new areas of intracranial hemorrhage. Stable gray-white matter differentiation throughout the brain. No cortically based acute infarct identified. Left thalamic chronic lacunar infarct is stable. Vascular: Interval bilateral middle meningeal artery embolization with hyperdense material. Calcified atherosclerosis at the skull base. Skull: Stable and intact. Sinuses/Orbits: Visualized paranasal sinuses and mastoids are stable and well aerated. Other: Stable orbit and scalp soft tissues. IMPRESSION: 1. Interval bilateral middle meningeal artery embolization. Left side SDH has decreased, now generally less than 4 mm in thickness. Right side SDH remains larger, more lobulated and mixed density, frequently 10 mm in thickness, and is superimposed on chronic right hemisphere encephalomalacia as before. 2. But no significant midline shift or intracranial mass effect now. 3. No new intracranial abnormality identified. Electronically Signed   By: VEAR Hurst M.D.   On: 07/28/2023 10:06    Assessment/Plan: Status post middle meningeal artery embolization day #4: The patient is doing well.  She is still  on heparin  drip and is being converted to Coumadin .  Her PTT has not significantly increased yet.  She can be discharged home from a neurosurgical point of  view once Coumadin  is therapeutic.  Please have her follow-up with Dr. Lanis in the office.  Please call if I can be of further assistance.  LOS: 8 days     Natasha Davis 07/30/2023, 9:23 AM     Patient ID: Natasha Davis, female   DOB: 15-Dec-1955, 68 y.o.   MRN: [id number]

## 2023-07-30 NOTE — Progress Notes (Signed)
 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  Allergies  Allergen Reactions   Penicillins Other (See Comments)    Couldn't walk    Patient Measurements: Height: (P) 4' 11 (149.9 cm) Weight: 99.3 kg (218 lb 14.7 oz) IBW/kg (Calculated) : (P) 43.2 HEPARIN  DW (KG): (P) 66.7  Vital Signs: Temp: 97.6 F (36.4 C) (07/05 0406) Temp Source: Oral (07/05 0406) BP: 146/67 (07/05 0406) Pulse Rate: 71 (07/05 0406)  Labs: Recent Labs    07/28/23 0424 07/28/23 0424 07/29/23 0400 07/29/23 1011 07/29/23 1902 07/30/23 0609 07/30/23 0610  HGB 10.8*  --  10.6*  --   --  9.6*  --   HCT 33.8*  --  32.6*  --   --  29.1*  --   PLT 304  --  310  --   --  339  --   LABPROT  --   --  15.5*  --   --  15.3*  --   INR  --   --  1.2  --   --  1.2  --   HEPARINUNFRC  --    < > 0.20* 0.25* 0.39  --  0.49  CREATININE 1.07*  --  0.79  --   --  0.81  --    < > = values in this interval not displayed.    Estimated Creatinine Clearance: 67.9 mL/min (by C-G formula based on SCr of 0.81 mg/dL).  Assessment: 43 YOF w/ PMH significant for PAF and mechanical MVR on Warfarin PTA. Held on admit due to SDH. S/p embolization on 07/26/23.SABRA Cleared to resume warfarin and heparin  bridge on 07/28/23 after repeat head CT.  Targeting low therapeutic heparin  levels.  PTA warfarin regimen: 5 mg daily; last dose 07/22/23 prior to admit.  Heparin  level is therapeutic (0.49) on 1250 units/hr. Hemoglobin trended down some, no bleeding reported. INR 2.1 on admit 6/27>>down to 1.2 on 7/4 and Warfarin 5 mg x 1 given. Remains 1.2.  Goal of Therapy:  Heparin  level 0.3-0.5 units/ml INR 2.5-3.5 Monitor platelets by anticoagulation protocol: Yes   Plan:  Continue heparin  drip at 1250 units/hr Warfarin 7.5 mg x 1 today. Daily heparin  level, PT/INR and CBC. Monitor for signs/symptoms of bleeding.  Genaro Zebedee Calin,  RPh 07/30/2023,11:14 AM

## 2023-07-30 NOTE — Plan of Care (Signed)
  Problem: Education: Goal: Knowledge of General Education information will improve Description: Including pain rating scale, medication(s)/side effects and non-pharmacologic comfort measures Outcome: Progressing   Problem: Clinical Measurements: Goal: Ability to maintain clinical measurements within normal limits will improve Outcome: Progressing Goal: Will remain free from infection Outcome: Progressing Goal: Respiratory complications will improve Outcome: Progressing Goal: Cardiovascular complication will be avoided Outcome: Progressing   Problem: Activity: Goal: Risk for activity intolerance will decrease Outcome: Progressing   Problem: Elimination: Goal: Will not experience complications related to urinary retention Outcome: Progressing   Problem: Pain Managment: Goal: General experience of comfort will improve and/or be controlled Outcome: Not Progressing

## 2023-07-31 DIAGNOSIS — I480 Paroxysmal atrial fibrillation: Secondary | ICD-10-CM | POA: Diagnosis not present

## 2023-07-31 DIAGNOSIS — S065XAA Traumatic subdural hemorrhage with loss of consciousness status unknown, initial encounter: Secondary | ICD-10-CM | POA: Diagnosis not present

## 2023-07-31 DIAGNOSIS — Z952 Presence of prosthetic heart valve: Secondary | ICD-10-CM | POA: Diagnosis not present

## 2023-07-31 LAB — CBC WITH DIFFERENTIAL/PLATELET
Abs Immature Granulocytes: 0.22 K/uL — ABNORMAL HIGH (ref 0.00–0.07)
Basophils Absolute: 0.1 K/uL (ref 0.0–0.1)
Basophils Relative: 0 %
Eosinophils Absolute: 0.2 K/uL (ref 0.0–0.5)
Eosinophils Relative: 1 %
HCT: 32.3 % — ABNORMAL LOW (ref 36.0–46.0)
Hemoglobin: 10.7 g/dL — ABNORMAL LOW (ref 12.0–15.0)
Immature Granulocytes: 2 %
Lymphocytes Relative: 11 %
Lymphs Abs: 1.6 K/uL (ref 0.7–4.0)
MCH: 31.1 pg (ref 26.0–34.0)
MCHC: 33.1 g/dL (ref 30.0–36.0)
MCV: 93.9 fL (ref 80.0–100.0)
Monocytes Absolute: 1 K/uL (ref 0.1–1.0)
Monocytes Relative: 7 %
Neutro Abs: 11.8 K/uL — ABNORMAL HIGH (ref 1.7–7.7)
Neutrophils Relative %: 79 %
Platelets: 360 K/uL (ref 150–400)
RBC: 3.44 MIL/uL — ABNORMAL LOW (ref 3.87–5.11)
RDW: 12.9 % (ref 11.5–15.5)
WBC: 14.9 K/uL — ABNORMAL HIGH (ref 4.0–10.5)
nRBC: 0 % (ref 0.0–0.2)

## 2023-07-31 LAB — BASIC METABOLIC PANEL WITH GFR
Anion gap: 13 (ref 5–15)
BUN: 13 mg/dL (ref 8–23)
CO2: 23 mmol/L (ref 22–32)
Calcium: 9.3 mg/dL (ref 8.9–10.3)
Chloride: 92 mmol/L — ABNORMAL LOW (ref 98–111)
Creatinine, Ser: 0.74 mg/dL (ref 0.44–1.00)
GFR, Estimated: >60 mL/min (ref >60)
Glucose, Bld: 120 mg/dL — ABNORMAL HIGH (ref 70–99)
Potassium: 4.4 mmol/L (ref 3.5–5.1)
Sodium: 128 mmol/L — ABNORMAL LOW (ref 135–145)

## 2023-07-31 LAB — HEPARIN LEVEL (UNFRACTIONATED): Heparin Unfractionated: 0.53 [IU]/mL (ref 0.30–0.70)

## 2023-07-31 LAB — PROTIME-INR
INR: 1.2 (ref 0.8–1.2)
Prothrombin Time: 15.4 s — ABNORMAL HIGH (ref 11.4–15.2)

## 2023-07-31 LAB — MAGNESIUM: Magnesium: 1.8 mg/dL (ref 1.7–2.4)

## 2023-07-31 MED ORDER — WARFARIN SODIUM 7.5 MG PO TABS
7.5000 mg | ORAL_TABLET | Freq: Once | ORAL | Status: AC
  Administered 2023-07-31: 7.5 mg via ORAL
  Filled 2023-07-31: qty 1

## 2023-07-31 MED ORDER — MAGNESIUM CITRATE PO SOLN
1.0000 | Freq: Once | ORAL | Status: AC
  Administered 2023-07-31: 1 via ORAL
  Filled 2023-07-31: qty 296

## 2023-07-31 NOTE — Plan of Care (Signed)
 Pt doing well with ambulating. Slowly improving. Waiting on INR to become therapeutic before DC.  Problem: Education: Goal: Knowledge of General Education information will improve Description: Including pain rating scale, medication(s)/side effects and non-pharmacologic comfort measures Outcome: Progressing   Problem: Health Behavior/Discharge Planning: Goal: Ability to manage health-related needs will improve Outcome: Progressing   Problem: Clinical Measurements: Goal: Ability to maintain clinical measurements within normal limits will improve Outcome: Progressing   Problem: Clinical Measurements: Goal: Will remain free from infection Outcome: Progressing   Problem: Clinical Measurements: Goal: Respiratory complications will improve Outcome: Progressing

## 2023-07-31 NOTE — Progress Notes (Signed)
 Progress Note  Patient Name: Natasha Davis Date of Encounter: 07/31/2023  Primary Cardiologist: Aleene Passe, MD  Interval Summary  Chart reviewed.  Patient reports no palpitations.  Worked with PT yesterday.  Vital Signs  Vitals:   07/30/23 2031 07/30/23 2358 07/31/23 0351 07/31/23 0727  BP: (!) 133/58 (!) 127/50 (!) 147/65 99/82  Pulse: 63 (!) 57 73 80  Resp: 18 17 18 18   Temp: 99 F (37.2 C) 98.3 F (36.8 C) 98.8 F (37.1 C) 99 F (37.2 C)  TempSrc: Oral Oral Oral Oral  SpO2: 98% 96% 97% 97%  Weight:      Height:        Intake/Output Summary (Last 24 hours) at 07/31/2023 0918 Last data filed at 07/31/2023 0400 Gross per 24 hour  Intake 261.01 ml  Output --  Net 261.01 ml   Filed Weights   07/22/23 1935 07/26/23 1107 07/30/23 0528  Weight: 96.2 kg (P) 96.2 kg 99.3 kg    Physical Exam  GEN: No acute distress.   Neck: No JVD. Cardiac: Irregularly irregular with prosthetic click in S1, no gallop.SABRA  Respiratory: Nonlabored. Clear to auscultation bilaterally. GI: Soft, nontender, bowel sounds present. MS: No edema. Neuro: Mild left-sided weakness.  ECG/Telemetry  Telemetry reviewed showing rate controlled atrial fibrillation..  Labs  Chemistry Recent Labs  Lab 07/26/23 0451 07/27/23 0442 07/28/23 0424 07/29/23 0400 07/30/23 0609  NA 131* 128* 131* 129* 129*  K 3.9 4.5 3.9 4.4 4.4  CL 92* 96* 95* 93* 94*  CO2 29 22 25 24 26   GLUCOSE 118* 138* 98 125* 127*  BUN 17 21 21 18 14   CREATININE 0.89 0.92 1.07* 0.79 0.81  CALCIUM  9.2 8.1* 8.8* 8.7* 8.8*  PROT 8.0 7.0  --   --  6.5  ALBUMIN 3.6 3.0*  --   --  2.9*  AST 42* 42*  --   --  40  ALT 57* 50*  --   --  62*  ALKPHOS 97 106  --   --  95  BILITOT 1.1 1.0  --   --  0.9  GFRNONAA >60 >60 56* >60 >60  ANIONGAP 10 10 11 12 9     Hematology Recent Labs  Lab 07/29/23 0400 07/30/23 0609 07/31/23 0841  WBC 14.6* 12.8* 14.9*  RBC 3.44* 3.13* 3.44*  HGB 10.6* 9.6* 10.7*  HCT 32.6* 29.1*  32.3*  MCV 94.8 93.0 93.9  MCH 30.8 30.7 31.1  MCHC 32.5 33.0 33.1  RDW 12.7 12.9 12.9  PLT 310 339 360    Lipid Panel     Component Value Date/Time   CHOL 102 07/19/2023 0443   CHOL 146 03/26/2016 1615   TRIG 57 07/19/2023 0443   HDL 39 (L) 07/19/2023 0443   HDL 50 03/26/2016 1615   CHOLHDL 2.6 07/19/2023 0443   VLDL 11 07/19/2023 0443   LDLCALC 52 07/19/2023 0443   LDLCALC 77 03/26/2016 1615   LDLDIRECT 147.2 03/22/2011 1619   LABVLDL 19 03/26/2016 1615    Cardiac Studies  Echocardiogram 07/19/2023:  1. Left ventricular ejection fraction, by estimation, is 40 to 45%. The  left ventricle has mildly decreased function. The left ventricle  demonstrates global hypokinesis. There is moderate concentric left  ventricular hypertrophy. Left ventricular  diastolic parameters are indeterminate.   2. Right ventricular systolic function is mildly reduced. The right  ventricular size is normal. There is normal pulmonary artery systolic  pressure.   3. Left atrial size was severely dilated.  4. The mitral valve has been repaired/replaced. No evidence of mitral  valve regurgitation. The mean mitral valve gradient is 4.3 mmHg.   5. The aortic valve is tricuspid. There is mild calcification of the  aortic valve. Aortic valve regurgitation is not visualized. Aortic valve  sclerosis is present, with no evidence of aortic valve stenosis.   6. The inferior vena cava is normal in size with <50% respiratory  variability, suggesting right atrial pressure of 8 mmHg.   Assessment & Plan  1.  Subdural hematomas now status post middle meningeal artery embolization and doing well.  2, [employer]. 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  Currently on IV heparin  following resumption of Coumadin  on July 3, INR not yet therapeutic.  3.  Persistent atrial fibrillation, heart rate controlled.  CHA2DS2-VASc score 6.  4, HFmrEF, LVEF 40 to 45% with global  hypokinesis and mild RV dysfunction.  Current GDMT includes Cozaar  50 mg daily, Toprol  XL 25 mg daily, and Aldactone  25 mg daily.  She continues on IV heparin  bridge with resumption of Coumadin , management per pharmacy.  INR pending today, 1.2 yesterday.  For questions or updates, please contact [facility] Please consult [email or web address] for contact info under   Signed, Jayson Sierras, MD  07/31/2023, 9:18 AM

## 2023-07-31 NOTE — Progress Notes (Signed)
 Subjective: The patient is alert and pleasant.  She has no complaints.  Objective: Vital signs in last 24 hours: Temp:  [98.2 F (36.8 C)-99.2 F (37.3 C)] 99 F (37.2 C) (07/06 0727) Pulse Rate:  [57-80] 80 (07/06 0727) Resp:  [17-18] 18 (07/06 0727) BP: (99-147)/(50-82) 99/82 (07/06 0727) SpO2:  [96 %-98 %] 97 % (07/06 0727) Estimated body mass index is 44.22 kg/m (pended) as calculated from the following:   Height as of this encounter: (P) 4' 11 (1.499 m).   Weight as of this encounter: 99.3 kg.   Intake/Output from previous day: 07/05 0701 - 07/06 0700 In: 261 [I.V.:261] Out: -  Intake/Output this shift: No intake/output data recorded.  Physical exam the patient is alert and oriented.  She is moving all 4 extremities well.  Lab Results: Recent Labs    07/29/23 0400 07/30/23 0609  WBC 14.6* 12.8*  HGB 10.6* 9.6*  HCT 32.6* 29.1*  PLT 310 339   BMET Recent Labs    07/29/23 0400 07/30/23 0609  NA 129* 129*  K 4.4 4.4  CL 93* 94*  CO2 24 26  GLUCOSE 125* 127*  BUN 18 14  CREATININE 0.79 0.81  CALCIUM  8.7* 8.8*    Studies/Results: No results found.  Assessment/Plan: Subdural hematoma, status post middle meningeal artery embolization: The patient is doing well.  PT is pending.  She can be discharged from our point of view.  She should follow-up with Dr. Lanis in a week or 2.  LOS: 9 days     Natasha Davis 07/31/2023, 7:33 AM     Patient ID: Natasha Davis, female   DOB: [birth year], 68 y.o.   MRN: [id number]

## 2023-07-31 NOTE — Progress Notes (Signed)
 Progress Note    Natasha Davis   FMW:[id number]  DOB: 05-Aug-1955  DOA: 07/22/2023     9 PCP: Marvine Rush, MD  Initial CC: Headache  Hospital Course: 68 y.o. female with medical history significant of seizure disorder, acute CVA (required admission from 07/18/23-07/20/23 for breakthrough seizure and acute CVA requiring increase in dose of Keppra  by neurology), history of mitral valve replacement on Coumadin , GERD, hyperlipidemia, paroxysmal atrial fibrillation, HFrEF 40 to 45% and morbid obesity presented with worsening headache.   On presentation, CT head showed acute bilateral subdural hematoma. Neurosurgery was consulted and recommended to hold Lovenox  and warfarin and recommended to repeat CT head in 8 to 10 hours.   Cardiology was also consulted. Subsequently, patient had breakthrough seizures: Keppra  dose was increased.  Neurology was consulted; LTM EEG was negative for seizures.  LTM EEG discontinued.  Neurology signed off on 07/24/2023.   Assessment & Plan:   Subdural hematoma Intractable headache - bilateral SDH noted on CT head - Evaluated by neurosurgery and ultimately underwent embolization of right and left middle meningeal artery on 07/26/2023 in efforts to alleviate time off of anticoagulation - CT head repeated 7/3 and discussed with neurosurgery.  Okay to resume heparin /Coumadin  and monitoring - Outpatient follow-up with neurosurgery planned for repeat imaging as well  History of recent acute incidental CVA - In the setting of subtherapeutic INR and the patient with mechanical mitral valve -MRI brain from 07/18/2023 noted acute/subacute cortical infarct involving left parietal lobe; chronic left thalamic infarct also noted -Coumadin  resumed 07/28/2023 with heparin  bridge - Continue statin   Breakthrough seizure Hx seizure disorder - breakthru seizures in the hospital on 07/23/2023 - Neurology was consulted.  Keppra  dose has been increased to 750 mg BID. LTM EEG was  negative for seizures.  LTM EEG discontinued - Neurology signed off on 07/24/2023. Seizure precaution.  Outpatient follow with neurology    Persistent A-fib Prolonged QT interval - Currently rate controlled.  Continue metoprolol  -Coumadin  resumed 07/28/2023 with heparin  bridge - Cardiology following  Hx Mechanical MVR - for hx mitral stenosis - Coumadin  resumed 07/28/2023 with heparin  bridge  HLD - continue statin  Normocytic anemia -Prior baseline around 11 to 12 g/dL -Remaining stable and near baseline   Chronic combined systolic and diastolic CHF Hypertension - Currently compensated - GDMT per cardiology   Elevated LFTs --questionable cause.  Mild.  Improving    Hyponatremia - stable - trend   Leukocytosis - Possibly reactive.  Improving.  Monitor   Morbid obesity - Complicates overall prognosis and care - Body mass index is 42.84 kg/m (pended).  Interval History:  No events overnight.  Headache slowly improving but still present.  Ranks 4/10 today. Otherwise doing okay. Still complaining of some constipation so we'll try mag citrate today too.  Told her to get up with assistance if she goes to the bathroom.   Old records reviewed in assessment of this patient  Antimicrobials:   DVT prophylaxis:  SCDs Start: 07/22/23 2301 Place TED hose Start: 07/22/23 2301 warfarin (COUMADIN ) tablet 7.5 mg   Code Status:   Code Status: Full Code  Mobility Assessment (Last 72 Hours)     Mobility Assessment     Row Name 07/31/23 0800 07/30/23 2031 07/30/23 1150 07/30/23 0800 07/29/23 1950   Does patient have an order for bedrest or is patient medically unstable No - Continue assessment No - Continue assessment -- No - Continue assessment No - Continue assessment   What is the highest  level of mobility based on the progressive mobility assessment? Level 5 (Walks with assist in room/hall) - Balance while stepping forward/back and can walk in room with assist - Complete Level  5 (Walks with assist in room/hall) - Balance while stepping forward/back and can walk in room with assist - Complete Level 5 (Walks with assist in room/hall) - Balance while stepping forward/back and can walk in room with assist - Complete Level 4 (Walks with assist in room) - Balance while marching in place and cannot step forward and back - Complete Level 4 (Walks with assist in room) - Balance while marching in place and cannot step forward and back - Complete   Is the above level different from baseline mobility prior to current illness? Yes - Recommend PT order Yes - Recommend PT order -- Yes - Recommend PT order Yes - Recommend PT order    Row Name 07/29/23 1036 07/29/23 0800 07/28/23 2000 07/28/23 1750     Does patient have an order for bedrest or is patient medically unstable -- No - Continue assessment No - Continue assessment --    What is the highest level of mobility based on the progressive mobility assessment? Level 5 (Walks with assist in room/hall) - Balance while stepping forward/back and can walk in room with assist - Complete Level 5 (Walks with assist in room/hall) - Balance while stepping forward/back and can walk in room with assist - Complete Level 4 (Walks with assist in room) - Balance while marching in place and cannot step forward and back - Complete Level 5 (Walks with assist in room/hall) - Balance while stepping forward/back and can walk in room with assist - Complete    Is the above level different from baseline mobility prior to current illness? -- Yes - Recommend PT order Yes - Recommend PT order --       Barriers to discharge: None Disposition Plan: None HH orders placed: TBD Status is: Inpatient  Objective: Blood pressure 99/82, pulse 80, temperature 99 F (37.2 C), temperature source Oral, resp. rate 18, height (P) 4' 11 (1.499 m), weight 99 kg, SpO2 97%.  Examination:  Physical Exam Constitutional:      Appearance: Normal appearance.  HENT:     Head:  Normocephalic and atraumatic.     Mouth/Throat:     Mouth: Mucous membranes are moist.  Eyes:     Extraocular Movements: Extraocular movements intact.  Cardiovascular:     Rate and Rhythm: Normal rate and regular rhythm.  Pulmonary:     Effort: Pulmonary effort is normal. No respiratory distress.     Breath sounds: Normal breath sounds. No wheezing.  Abdominal:     General: Bowel sounds are normal. There is no distension.     Palpations: Abdomen is soft.     Tenderness: There is no abdominal tenderness.  Musculoskeletal:        General: Normal range of motion.     Cervical back: Normal range of motion and neck supple.  Skin:    General: Skin is warm and dry.  Neurological:     Mental Status: She is alert.     Comments:  4/5 strength in LUE/LLE and paresthesias in both left upper and lower extremities per patient  Psychiatric:        Mood and Affect: Mood normal.      Consultants:  Neurosurgery  Procedures:  07/26/2023 PROCEDURE: Onyx embolization of right middle meningeal artery  Onyx embolization of left middle meningeal artery  Data Reviewed: Results for orders placed or performed during the hospital encounter of 07/22/23 (from the past 24 hours)  Basic metabolic panel with GFR     Status: Abnormal   Collection Time: 07/31/23  8:41 AM  Result Value Ref Range   Sodium 128 (L) 135 - 145 mmol/L   Potassium 4.4 3.5 - 5.1 mmol/L   Chloride 92 (L) 98 - 111 mmol/L   CO2 23 22 - 32 mmol/L   Glucose, Bld 120 (H) 70 - 99 mg/dL   BUN 13 8 - 23 mg/dL   Creatinine, Ser 9.25 0.44 - 1.00 mg/dL   Calcium  9.3 8.9 - 10.3 mg/dL   GFR, Estimated >39 >39 mL/min   Anion gap 13 5 - 15  CBC with Differential/Platelet     Status: Abnormal   Collection Time: 07/31/23  8:41 AM  Result Value Ref Range   WBC 14.9 (H) 4.0 - 10.5 K/uL   RBC 3.44 (L) 3.87 - 5.11 MIL/uL   Hemoglobin 10.7 (L) 12.0 - 15.0 g/dL   HCT 67.6 (L) 63.9 - 53.9 %   MCV 93.9 80.0 - 100.0 fL   MCH 31.1 26.0 - 34.0 pg    MCHC 33.1 30.0 - 36.0 g/dL   RDW 87.0 88.4 - 84.4 %   Platelets 360 150 - 400 K/uL   nRBC 0.0 0.0 - 0.2 %   Neutrophils Relative % 79 %   Neutro Abs 11.8 (H) 1.7 - 7.7 K/uL   Lymphocytes Relative 11 %   Lymphs Abs 1.6 0.7 - 4.0 K/uL   Monocytes Relative 7 %   Monocytes Absolute 1.0 0.1 - 1.0 K/uL   Eosinophils Relative 1 %   Eosinophils Absolute 0.2 0.0 - 0.5 K/uL   Basophils Relative 0 %   Basophils Absolute 0.1 0.0 - 0.1 K/uL   Immature Granulocytes 2 %   Abs Immature Granulocytes 0.22 (H) 0.00 - 0.07 K/uL  Magnesium      Status: None   Collection Time: 07/31/23  8:41 AM  Result Value Ref Range   Magnesium  1.8 1.7 - 2.4 mg/dL  Protime-INR     Status: Abnormal   Collection Time: 07/31/23  8:41 AM  Result Value Ref Range   Prothrombin Time 15.4 (H) 11.4 - 15.2 seconds   INR 1.2 0.8 - 1.2  Heparin  level (unfractionated)     Status: None   Collection Time: 07/31/23  8:41 AM  Result Value Ref Range   Heparin  Unfractionated 0.53 0.30 - 0.70 IU/mL    I have reviewed pertinent nursing notes, vitals, labs, and images as necessary. I have ordered labwork to follow up on as indicated.  I have reviewed the last notes from staff over past 24 hours. I have discussed patient's care plan and test results with nursing staff, CM/SW, and other staff as appropriate.  Time spent: Greater than 50% of the 55 minute visit was spent in counseling/coordination of care for the patient as laid out in the A&P.   LOS: 9 days   Alm Apo, MD [facility] 07/31/2023, 11:49 AM

## 2023-07-31 NOTE — Progress Notes (Signed)
 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  Allergies  Allergen Reactions   Penicillins Other (See Comments)    Couldn't walk    Patient Measurements: Height: (P) 4' 11 (149.9 cm) Weight: 99.3 kg (218 lb 14.7 oz) IBW/kg (Calculated) : (P) 43.2 HEPARIN  DW (KG): (P) 66.7  Vital Signs: Temp: 99 F (37.2 C) (07/06 0727) Temp Source: Oral (07/06 0727) BP: 99/82 (07/06 0727) Pulse Rate: 80 (07/06 0727)  Labs: Recent Labs    07/29/23 0400 07/29/23 1011 07/29/23 1902 07/30/23 0609 07/30/23 0610 07/31/23 0841  HGB 10.6*  --   --  9.6*  --  10.7*  HCT 32.6*  --   --  29.1*  --  32.3*  PLT 310  --   --  339  --  360  LABPROT 15.5*  --   --  15.3*  --  15.4*  INR 1.2  --   --  1.2  --  1.2  HEPARINUNFRC 0.20*   < > 0.39  --  0.49 0.53  CREATININE 0.79  --   --  0.81  --  0.74   < > = values in this interval not displayed.    Estimated Creatinine Clearance: 68.7 mL/min (by C-G formula based on SCr of 0.74 mg/dL).  Assessment: 2 YOF w/ PMH significant for PAF and mechanical MVR on Warfarin PTA. Held on admit due to SDH. S/p embolization on 07/26/23.SABRA Cleared to resume warfarin and heparin  bridge on 07/28/23 after repeat head CT.  Targeting low therapeutic heparin  levels.  PTA warfarin regimen: 5 mg daily; last dose 07/22/23 prior to admit.  Heparin  level remains therapeutic (0.53) on 1250 units/hr. Just above low therapeutic goal. Hemoglobin trended down some on 7/5 but back to prior value today. No bleeding reported.  INR 2.1 on admit 6/27>>down to 1.2 on 7/4 and Warfarin resumed. INR unchanged at 1.2 after Warfarin 5 mg on 7/4 then 7.5 mg on 7/5.  Goal of Therapy:  Heparin  level 0.3-0.5 units/ml INR 2.5-3.5 Monitor platelets by anticoagulation protocol: Yes   Plan:  Continue heparin  drip at 1250 units/hr Warfarin 7.5 mg x 1 again today. Daily heparin  level, PT/INR and  CBC. Monitor for signs/symptoms of bleeding.  Genaro Zebedee Calin, RPh 07/31/2023,10:38 AM

## 2023-08-01 ENCOUNTER — Encounter (HOSPITAL_COMMUNITY): Payer: Self-pay | Admitting: Radiology

## 2023-08-01 DIAGNOSIS — S065XAA Traumatic subdural hemorrhage with loss of consciousness status unknown, initial encounter: Secondary | ICD-10-CM | POA: Diagnosis not present

## 2023-08-01 DIAGNOSIS — Z952 Presence of prosthetic heart valve: Secondary | ICD-10-CM | POA: Diagnosis not present

## 2023-08-01 DIAGNOSIS — I480 Paroxysmal atrial fibrillation: Secondary | ICD-10-CM | POA: Diagnosis not present

## 2023-08-01 LAB — CBC WITH DIFFERENTIAL/PLATELET
Abs Immature Granulocytes: 0.23 K/uL — ABNORMAL HIGH (ref 0.00–0.07)
Basophils Absolute: 0.1 K/uL (ref 0.0–0.1)
Basophils Relative: 0 %
Eosinophils Absolute: 0.1 K/uL (ref 0.0–0.5)
Eosinophils Relative: 1 %
HCT: 32.1 % — ABNORMAL LOW (ref 36.0–46.0)
Hemoglobin: 10.4 g/dL — ABNORMAL LOW (ref 12.0–15.0)
Immature Granulocytes: 2 %
Lymphocytes Relative: 12 %
Lymphs Abs: 1.7 K/uL (ref 0.7–4.0)
MCH: 30.9 pg (ref 26.0–34.0)
MCHC: 32.4 g/dL (ref 30.0–36.0)
MCV: 95.3 fL (ref 80.0–100.0)
Monocytes Absolute: 1.3 K/uL — ABNORMAL HIGH (ref 0.1–1.0)
Monocytes Relative: 9 %
Neutro Abs: 10.8 K/uL — ABNORMAL HIGH (ref 1.7–7.7)
Neutrophils Relative %: 76 %
Platelets: 366 K/uL (ref 150–400)
RBC: 3.37 MIL/uL — ABNORMAL LOW (ref 3.87–5.11)
RDW: 12.9 % (ref 11.5–15.5)
WBC: 14.1 K/uL — ABNORMAL HIGH (ref 4.0–10.5)
nRBC: 0 % (ref 0.0–0.2)

## 2023-08-01 LAB — BASIC METABOLIC PANEL WITH GFR
Anion gap: 11 (ref 5–15)
BUN: 12 mg/dL (ref 8–23)
CO2: 27 mmol/L (ref 22–32)
Calcium: 9.1 mg/dL (ref 8.9–10.3)
Chloride: 92 mmol/L — ABNORMAL LOW (ref 98–111)
Creatinine, Ser: 0.76 mg/dL (ref 0.44–1.00)
GFR, Estimated: >60 mL/min (ref >60)
Glucose, Bld: 121 mg/dL — ABNORMAL HIGH (ref 70–99)
Potassium: 4.8 mmol/L (ref 3.5–5.1)
Sodium: 130 mmol/L — ABNORMAL LOW (ref 135–145)

## 2023-08-01 LAB — HEPARIN LEVEL (UNFRACTIONATED)
Heparin Unfractionated: 0.36 [IU]/mL (ref 0.30–0.70)
Heparin Unfractionated: 0.51 [IU]/mL (ref 0.30–0.70)

## 2023-08-01 LAB — MAGNESIUM: Magnesium: 2.1 mg/dL (ref 1.7–2.4)

## 2023-08-01 LAB — PROTIME-INR
INR: 1.3 — ABNORMAL HIGH (ref 0.8–1.2)
Prothrombin Time: 16.4 s — ABNORMAL HIGH (ref 11.4–15.2)

## 2023-08-01 MED ORDER — WARFARIN SODIUM 7.5 MG PO TABS
7.5000 mg | ORAL_TABLET | Freq: Once | ORAL | Status: AC
  Administered 2023-08-01: 7.5 mg via ORAL
  Filled 2023-08-01: qty 1

## 2023-08-01 NOTE — Progress Notes (Signed)
 Brief cardiology progress note:  Chart reviewed. Now being bridged on heparin  while awaiting therapeutic INR for mechanical mitral valve. Remainder of cardiovascular issues are stable.  Cardiology will sign off at this time as pharmacy is managing bridging. Please contact us  with any questions or concerns. She has follow up with the coumadin  clinic on 7/11. Will arrange for outpatient cardiology follow up as well.  Shelda Bruckner, MD, PhD, [facility] [place]  [facility]  [place]  [facility] [place] [facility] [address] [place], [place] [address] [phone]

## 2023-08-01 NOTE — Progress Notes (Signed)
 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  Allergies  Allergen Reactions   Penicillins Other (See Comments)    Couldn't walk    Patient Measurements: Height: (P) 4' 11 (149.9 cm) Weight: 96.1 kg (211 lb 13.8 oz) IBW/kg (Calculated) : (P) 43.2 HEPARIN  DW (KG): (P) 66.7  Vital Signs: Temp: 99.4 F (37.4 C) (07/07 0752) Temp Source: Oral (07/07 0752) BP: 128/92 (07/07 0752) Pulse Rate: 69 (07/07 0752)  Labs: Recent Labs    07/30/23 0609 07/30/23 0610 07/31/23 0841 08/01/23 0448 08/01/23 1151  HGB 9.6*  --  10.7* 10.4*  --   HCT 29.1*  --  32.3* 32.1*  --   PLT 339  --  360 366  --   LABPROT 15.3*  --  15.4* 16.4*  --   INR 1.2  --  1.2 1.3*  --   HEPARINUNFRC  --    < > 0.53 0.36 0.51  CREATININE 0.81  --  0.74 0.76  --    < > = values in this interval not displayed.    Estimated Creatinine Clearance: 67.5 mL/min (by C-G formula based on SCr of 0.76 mg/dL).  Assessment: 40 YOF w/ PMH significant for PAF and mechanical MVR on Warfarin PTA. Held on admit due to SDH. S/p embolization on 07/26/23.SABRA Cleared to resume warfarin and heparin  bridge on 07/28/23 after repeat head CT.  Targeting low therapeutic heparin  levels.  PTA warfarin regimen: 5 mg daily; last dose 07/22/23 prior to admit.  Heparin  level remains therapeutic (0.53) on 1250 units/hr. Just above low therapeutic goal. Hemoglobin trended down some on 7/5 but back to prior value today. No bleeding reported.  INR 2.1 on admit 6/27>>down to 1.2 on 7/4 and Warfarin resumed. INR unchanged at 1.2 after Warfarin 5 mg on 7/4 then 7.5 mg on 7/5.  7/7 AM update: HL 0.36 / 0.51 INR 1.3 Hgb stable 10's PLT wnl No signs of bleeding or pauses in the infusion per nursing  Goal of Therapy:  Heparin  level 0.3-0.5 units/ml INR 2.5-3.5 Monitor platelets by anticoagulation protocol: Yes   Plan:  Continue heparin  drip at 1250  units/hr Warfarin 7.5 mg x 1 again today Daily heparin  level, PT/INR and CBC. Monitor for signs/symptoms of bleeding.   Benedetta Heath BS, PharmD, BCPS Clinical Pharmacist 08/01/2023 12:35 PM  Contact: [phone] after 3 PM  Be curious, not judgmental... -Davina Sprinkles

## 2023-08-01 NOTE — Progress Notes (Signed)
 Progress Note    Natasha Davis   FMW:[id number]  DOB: [birth year]  DOA: 07/22/2023     10 PCP: Marvine Rush, MD  Initial CC: Headache  Hospital Course: 68 y.o. female with medical history significant of seizure disorder, acute CVA (required admission from 07/18/23-07/20/23 for breakthrough seizure and acute CVA requiring increase in dose of Keppra  by neurology), history of mitral valve replacement on Coumadin , GERD, hyperlipidemia, paroxysmal atrial fibrillation, HFrEF 40 to 45% and morbid obesity presented with worsening headache.   On presentation, CT head showed acute bilateral subdural hematoma. Neurosurgery was consulted and recommended to hold Lovenox  and warfarin and recommended to repeat CT head in 8 to 10 hours.   Cardiology was also consulted. Subsequently, patient had breakthrough seizures: Keppra  dose was increased.  Neurology was consulted; LTM EEG was negative for seizures.  LTM EEG discontinued.  Neurology signed off on 07/24/2023.   Assessment & Plan:   Subdural hematoma Intractable headache - bilateral SDH noted on CT head - Evaluated by neurosurgery and ultimately underwent embolization of right and left middle meningeal artery on 07/26/2023 in efforts to alleviate time off of anticoagulation - CT head repeated 7/3 and discussed with neurosurgery.  Okay to resume heparin /Coumadin  and monitoring - Outpatient follow-up with neurosurgery planned for repeat imaging as well  Hx Mechanical MVR - for hx mitral stenosis - heparin  started 7/3 and first dose Coumadin  given 7/4 - Continue heparin  while awaiting therapeutic INR  History of recent acute incidental CVA - In the setting of subtherapeutic INR and the patient with mechanical mitral valve -MRI brain from 07/18/2023 noted acute/subacute cortical infarct involving left parietal lobe; chronic left thalamic infarct also noted - Continue statin   Breakthrough seizure Hx seizure disorder - breakthru seizures in the  hospital on 07/23/2023 - Neurology was consulted.  Keppra  dose has been increased to 750 mg BID. LTM EEG was negative for seizures.  LTM EEG discontinued - Neurology signed off on 07/24/2023. Seizure precaution.  Outpatient follow with neurology    Persistent A-fib Prolonged QT interval - Currently rate controlled.  Continue metoprolol  -Continuing anticoagulation as noted above - Cardiology followed, appreciate assistance  HLD - continue statin  Normocytic anemia -Prior baseline around 11 to 12 g/dL -Remaining stable and near baseline   Chronic combined systolic and diastolic CHF Hypertension - Currently compensated - GDMT per cardiology   Elevated LFTs --questionable cause.  Mild.  Improving    Hyponatremia - stable - trend   Leukocytosis - Possibly reactive.  Improving.  Monitor   Morbid obesity - Complicates overall prognosis and care - Body mass index is 42.84 kg/m (pended).  Interval History:  No events overnight.  Still has mild headache but overall is tolerable. In general, she does state that she is feeling better each day. Awaiting therapeutic INR prior to discharge.  Old records reviewed in assessment of this patient  Antimicrobials:   DVT prophylaxis:  SCDs Start: 07/22/23 2301 Place TED hose Start: 07/22/23 2301 warfarin (COUMADIN ) tablet 7.5 mg   Code Status:   Code Status: Full Code  Mobility Assessment (Last 72 Hours)     Mobility Assessment     Row Name 07/31/23 2050 07/31/23 0800 07/30/23 2031 07/30/23 1150 07/30/23 0800   Does patient have an order for bedrest or is patient medically unstable No - Continue assessment No - Continue assessment No - Continue assessment -- No - Continue assessment   What is the highest level of mobility based on the progressive mobility assessment?  Level 5 (Walks with assist in room/hall) - Balance while stepping forward/back and can walk in room with assist - Complete Level 5 (Walks with assist in room/hall) -  Balance while stepping forward/back and can walk in room with assist - Complete Level 5 (Walks with assist in room/hall) - Balance while stepping forward/back and can walk in room with assist - Complete Level 5 (Walks with assist in room/hall) - Balance while stepping forward/back and can walk in room with assist - Complete Level 4 (Walks with assist in room) - Balance while marching in place and cannot step forward and back - Complete   Is the above level different from baseline mobility prior to current illness? -- Yes - Recommend PT order Yes - Recommend PT order -- Yes - Recommend PT order    Row Name 07/29/23 1950           Does patient have an order for bedrest or is patient medically unstable No - Continue assessment       What is the highest level of mobility based on the progressive mobility assessment? Level 4 (Walks with assist in room) - Balance while marching in place and cannot step forward and back - Complete       Is the above level different from baseline mobility prior to current illness? Yes - Recommend PT order          Barriers to discharge: None Disposition Plan: None HH orders placed: TBD Status is: Inpatient  Objective: Blood pressure (!) 128/92, pulse 69, temperature 99.4 F (37.4 C), temperature source Oral, resp. rate 16, height (P) 4' 11 (1.499 m), weight 96.1 kg, SpO2 96%.  Examination:  Physical Exam Constitutional:      Appearance: Normal appearance.  HENT:     Head: Normocephalic and atraumatic.     Mouth/Throat:     Mouth: Mucous membranes are moist.  Eyes:     Extraocular Movements: Extraocular movements intact.  Cardiovascular:     Rate and Rhythm: Normal rate and regular rhythm.  Pulmonary:     Effort: Pulmonary effort is normal. No respiratory distress.     Breath sounds: Normal breath sounds. No wheezing.  Abdominal:     General: Bowel sounds are normal. There is no distension.     Palpations: Abdomen is soft.     Tenderness: There is no  abdominal tenderness.  Musculoskeletal:        General: Normal range of motion.     Cervical back: Normal range of motion and neck supple.  Skin:    General: Skin is warm and dry.  Neurological:     Mental Status: She is alert.     Comments:  4/5 strength in LUE/LLE and paresthesias in both left upper and lower extremities per patient  Psychiatric:        Mood and Affect: Mood normal.      Consultants:  Neurosurgery Cardiology, signed off 08/01/2023  Procedures:  07/26/2023 PROCEDURE: Onyx embolization of right middle meningeal artery  Onyx embolization of left middle meningeal artery   Data Reviewed: Results for orders placed or performed during the hospital encounter of 07/22/23 (from the past 24 hours)  Basic metabolic panel with GFR     Status: Abnormal   Collection Time: 08/01/23  4:48 AM  Result Value Ref Range   Sodium 130 (L) 135 - 145 mmol/L   Potassium 4.8 3.5 - 5.1 mmol/L   Chloride 92 (L) 98 - 111 mmol/L   CO2 27  22 - 32 mmol/L   Glucose, Bld 121 (H) 70 - 99 mg/dL   BUN 12 8 - 23 mg/dL   Creatinine, Ser 9.23 0.44 - 1.00 mg/dL   Calcium  9.1 8.9 - 10.3 mg/dL   GFR, Estimated >39 >39 mL/min   Anion gap 11 5 - 15  CBC with Differential/Platelet     Status: Abnormal   Collection Time: 08/01/23  4:48 AM  Result Value Ref Range   WBC 14.1 (H) 4.0 - 10.5 K/uL   RBC 3.37 (L) 3.87 - 5.11 MIL/uL   Hemoglobin 10.4 (L) 12.0 - 15.0 g/dL   HCT 67.8 (L) 63.9 - 53.9 %   MCV 95.3 80.0 - 100.0 fL   MCH 30.9 26.0 - 34.0 pg   MCHC 32.4 30.0 - 36.0 g/dL   RDW 87.0 88.4 - 84.4 %   Platelets 366 150 - 400 K/uL   nRBC 0.0 0.0 - 0.2 %   Neutrophils Relative % 76 %   Neutro Abs 10.8 (H) 1.7 - 7.7 K/uL   Lymphocytes Relative 12 %   Lymphs Abs 1.7 0.7 - 4.0 K/uL   Monocytes Relative 9 %   Monocytes Absolute 1.3 (H) 0.1 - 1.0 K/uL   Eosinophils Relative 1 %   Eosinophils Absolute 0.1 0.0 - 0.5 K/uL   Basophils Relative 0 %   Basophils Absolute 0.1 0.0 - 0.1 K/uL   Immature  Granulocytes 2 %   Abs Immature Granulocytes 0.23 (H) 0.00 - 0.07 K/uL  Magnesium      Status: None   Collection Time: 08/01/23  4:48 AM  Result Value Ref Range   Magnesium  2.1 1.7 - 2.4 mg/dL  Protime-INR     Status: Abnormal   Collection Time: 08/01/23  4:48 AM  Result Value Ref Range   Prothrombin Time 16.4 (H) 11.4 - 15.2 seconds   INR 1.3 (H) 0.8 - 1.2  Heparin  level (unfractionated)     Status: None   Collection Time: 08/01/23  4:48 AM  Result Value Ref Range   Heparin  Unfractionated 0.36 0.30 - 0.70 IU/mL  Heparin  level (unfractionated)     Status: None   Collection Time: 08/01/23 11:51 AM  Result Value Ref Range   Heparin  Unfractionated 0.51 0.30 - 0.70 IU/mL    I have reviewed pertinent nursing notes, vitals, labs, and images as necessary. I have ordered labwork to follow up on as indicated.  I have reviewed the last notes from staff over past 24 hours. I have discussed patient's care plan and test results with nursing staff, CM/SW, and other staff as appropriate.  Time spent: Greater than 50% of the 55 minute visit was spent in counseling/coordination of care for the patient as laid out in the A&P.   LOS: 10 days   Alm Apo, MD [facility] 08/01/2023, 1:49 PM

## 2023-08-01 NOTE — Progress Notes (Signed)
 Occupational Therapy Treatment Patient Details Name: Natasha Davis MRN: [id number] DOB: [birth year] Today's Date: 08/01/2023   History of present illness Pt is a 68 y.o. F who presents 07/22/2023 with worsening headache. On presentation, CT head showed acute bilateral subdural hematoma with some shift. S/p bilateral MMA embolization 7/1. PMH includes: CVA (admission 6/23-6/25/2025 for breakthrough seizure and acute CVA), HTN, seizure.   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 Educated pt and provided HEP for increased motor control of digits, with increased effort she can perform adduction of all digits including pink/middle finger of LUE. She is progressing with increased awareness to L side but continues to drift L with mobility. She states this is because she feels dizzy and swimmy headed with mobility. OT to continue following pt to progress with LUE strength and coordination. Patient would benefit from post acute Home OT services to help maximize functional independence in natural environment       If plan is discharge home, recommend the following:  A little help with bathing/dressing/bathroom;Assistance with cooking/housework;Direct supervision/assist for medications management;Direct supervision/assist for financial management;Assist for transportation;Help with stairs or ramp for entrance   Equipment Recommendations  Tub/shower seat;Other (comment) (RW)    Recommendations for Other Services      Precautions / Restrictions Precautions Precautions: Fall Recall of Precautions/Restrictions: Intact Restrictions Weight Bearing Restrictions Per Provider Order: No       Mobility Bed Mobility               General bed mobility comments: Pt on toilet upon OT arrival    Transfers Overall transfer  level: Needs assistance Equipment used: Rolling walker (2 wheels) Transfers: Sit to/from Stand Sit to Stand: Contact guard assist           General transfer comment: Cues for hand placement, STS from toilet and EOB     Balance Overall balance assessment: Needs assistance Sitting-balance support: Feet supported Sitting balance-Leahy Scale: Good     Standing balance support: During functional activity, Bilateral upper extremity supported Standing balance-Leahy Scale: Poor Standing balance comment: Rw support                           ADL either performed or assessed with clinical judgement   ADL       Grooming: Standing;Contact guard assist;Wash/dry hands                               Functional mobility during ADLs: Contact guard assist;Rolling walker (2 wheels) General ADL Comments: Focused session on [facility] and LUE strengthening.    Extremity/Trunk Assessment Upper Extremity Assessment LUE Deficits / Details: L UE hx of decreased coordination from prior CVA. decreased FTN coord. Weaker at baseline from prior CVA. SHoudler flexion 3+/5, elbow flex/ext 4/5 MMT. LUE Sensation: decreased light touch LUE Coordination: decreased fine motor            Vision       Perception Perception Perception: Impaired Preception Impairment Details: Inattention/Neglect Perception-Other Comments: Lt inattention, improving. Pt avoiding obstacles against L side but drifts L with gait   Praxis Praxis Praxis: WFL   Communication Communication Communication: No apparent difficulties   Cognition Arousal: Alert Behavior During Therapy: WFL for tasks assessed/performed Cognition:  Cognition impaired     Awareness: Intellectual awareness intact, Online awareness impaired Memory impairment (select all impairments): Working Civil Service fast streamer, Emergency planning/management officer functioning impairment (select all impairments): Problem solving                      Following commands impaired: Follows one step commands with increased time, Follows multi-step commands inconsistently      Cueing   Cueing Techniques: Verbal cues  Exercises General Exercises - Upper Extremity Shoulder Extension: AROM, Other (comment), Strengthening, Theraband, Standing (x8 reps) Theraband Level (Shoulder Extension): Level 3 (Green) Other Exercises Other Exercises: D2 flexion with green resist theraband x8 Reps LUE AROM Other Exercises: Isolated digit ext, LUE Other Exercises: Abductin/adduction of L digits AROM Other Exercises: Dynamic reaching with stacking of cups for LUE coordination Other Exercises: LUE FTN used as intervention.    Shoulder Instructions       General Comments Pt c/o dizziness/swimmy feeling when OOB causing her to drift L.    Pertinent Vitals/ Pain       Pain Assessment Pain Assessment: No/denies pain  Home Living                                          Prior Functioning/Environment              Frequency  Min 2X/week        Progress Toward Goals  OT Goals(current goals can now be found in the care plan section)  Progress towards OT goals: Progressing toward goals  Acute Rehab OT Goals Patient Stated Goal: TO go home OT Goal Formulation: With patient Time For Goal Achievement: 08/11/23 Potential to Achieve Goals: Good  Plan      Co-evaluation                 AM-PAC OT 6 Clicks Daily Activity     Outcome Measure   Help from another person eating meals?: None Help from another person taking care of personal grooming?: A Little Help from another person toileting, which includes using toliet, bedpan, or urinal?: A Little Help from another person bathing (including washing, rinsing, drying)?: A Little Help from another person to put on and taking off regular upper body clothing?: A Little Help from another person to put on and taking off regular lower body clothing?: A Little 6 Click  Score: 19    End of Session Equipment Utilized During Treatment: Gait belt;Rolling walker (2 wheels)  OT Visit Diagnosis: Other abnormalities of gait and mobility (R26.89);Pain;Unsteadiness on feet (R26.81) Pain - Right/Left:  (headache)   Activity Tolerance Patient tolerated treatment well   Patient Left in bed;with call bell/phone within reach;with family/visitor present   Nurse Communication Mobility status        Time: 8263-8192 OT Time Calculation (min): 31 min  Charges: OT General Charges $OT Visit: 1 Visit OT Treatments $Therapeutic Activity: 8-22 mins $Therapeutic Exercise: 8-22 mins  08/01/2023  AB, OTR/L  Acute Rehabilitation Services  Office: [phone]   Natasha Davis 08/01/2023, 6:26 PM

## 2023-08-01 NOTE — Progress Notes (Signed)
 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  Allergies  Allergen Reactions   Penicillins Other (See Comments)    Couldn't walk    Patient Measurements: Height: (P) 4' 11 (149.9 cm) Weight: 96.1 kg (211 lb 13.8 oz) IBW/kg (Calculated) : (P) 43.2 HEPARIN  DW (KG): (P) 66.7  Vital Signs: Temp: 99.4 F (37.4 C) (07/07 0752) Temp Source: Oral (07/07 0752) BP: 128/92 (07/07 0752) Pulse Rate: 69 (07/07 0752)  Labs: Recent Labs    07/30/23 0609 07/30/23 0610 07/31/23 0841 08/01/23 0448  HGB 9.6*  --  10.7* 10.4*  HCT 29.1*  --  32.3* 32.1*  PLT 339  --  360 366  LABPROT 15.3*  --  15.4* 16.4*  INR 1.2  --  1.2 1.3*  HEPARINUNFRC  --  0.49 0.53 0.36  CREATININE 0.81  --  0.74 0.76    Estimated Creatinine Clearance: 67.5 mL/min (by C-G formula based on SCr of 0.76 mg/dL).  Assessment: 52 YOF w/ PMH significant for PAF and mechanical MVR on Warfarin PTA. Held on admit due to SDH. S/p embolization on 07/26/23.SABRA Cleared to resume warfarin and heparin  bridge on 07/28/23 after repeat head CT.  Targeting low therapeutic heparin  levels.  PTA warfarin regimen: 5 mg daily; last dose 07/22/23 prior to admit.  Heparin  level remains therapeutic (0.53) on 1250 units/hr. Just above low therapeutic goal. Hemoglobin trended down some on 7/5 but back to prior value today. No bleeding reported.  INR 2.1 on admit 6/27>>down to 1.2 on 7/4 and Warfarin resumed. INR unchanged at 1.2 after Warfarin 5 mg on 7/4 then 7.5 mg on 7/5.  7/7 AM update: HL 0.36 INR 1.3 Hgb stable 10's PLT wnl No signs of bleeding or pauses in the infusion per nursing  Goal of Therapy:  Heparin  level 0.3-0.5 units/ml INR 2.5-3.5 Monitor platelets by anticoagulation protocol: Yes   Plan:  Continue heparin  drip at 1250 units/hr Warfarin 7.5 mg x 1 again today HL in 6 hours due to significant drop  Daily heparin  level, PT/INR  and CBC. Monitor for signs/symptoms of bleeding.   Benedetta Heath BS, PharmD, BCPS Clinical Pharmacist 08/01/2023 9:49 AM  Contact: [phone] after 3 PM  Be curious, not judgmental... -Davina Sprinkles

## 2023-08-01 NOTE — TOC Progression Note (Signed)
 Transition of Care [facility]) - Progression Note    Patient Details  Name: SANTANA GOSDIN MRN: [id number] Date of Birth: 12-Aug-1955  [facility] CM/SW Contact  Andrez JULIANNA George, RN Phone Number: 08/01/2023, 3:18 PM  Clinical Narrative:     Awaiting therapeutic INR. TOC following  Expected Discharge Plan: Home w Home Health Services Barriers to Discharge: Continued Medical Work up  Expected Discharge Plan and Services   Discharge Planning Services: CM Consult   Living arrangements for the past 2 months: Single Family Home                           HH Arranged: PT [facility] Agency: [facility] Date [facility] Agency Contacted: 07/28/23   Representative spoke with at [facility] Agency: Darleene   Social Determinants of Health (SDOH) Interventions SDOH Screenings   Food Insecurity: No Food Insecurity (07/23/2023)  Housing: Low Risk  (07/23/2023)  Transportation Needs: No Transportation Needs (07/23/2023)  Utilities: Not At Risk (07/23/2023)  Social Connections: Moderately Integrated (07/23/2023)  Tobacco Use: Medium Risk (07/26/2023)    Readmission Risk Interventions     No data to display

## 2023-08-02 DIAGNOSIS — S065XAA Traumatic subdural hemorrhage with loss of consciousness status unknown, initial encounter: Secondary | ICD-10-CM | POA: Diagnosis not present

## 2023-08-02 DIAGNOSIS — Z952 Presence of prosthetic heart valve: Secondary | ICD-10-CM | POA: Diagnosis not present

## 2023-08-02 DIAGNOSIS — I480 Paroxysmal atrial fibrillation: Secondary | ICD-10-CM | POA: Diagnosis not present

## 2023-08-02 LAB — PROTIME-INR
INR: 1.3 — ABNORMAL HIGH (ref 0.8–1.2)
Prothrombin Time: 17.3 s — ABNORMAL HIGH (ref 11.4–15.2)

## 2023-08-02 LAB — HEPARIN LEVEL (UNFRACTIONATED): Heparin Unfractionated: 0.49 [IU]/mL (ref 0.30–0.70)

## 2023-08-02 MED ORDER — WARFARIN SODIUM 7.5 MG PO TABS: 7.5000 mg | ORAL_TABLET | Freq: Once | ORAL | Status: DC

## 2023-08-02 MED ORDER — WARFARIN SODIUM 5 MG PO TABS
10.0000 mg | ORAL_TABLET | Freq: Once | ORAL | Status: AC
  Administered 2023-08-02: 10 mg via ORAL
  Filled 2023-08-02: qty 2

## 2023-08-02 NOTE — Plan of Care (Signed)
  Problem: Education: Goal: Knowledge of General Education information will improve Description: Including pain rating scale, medication(s)/side effects and non-pharmacologic comfort measures Outcome: Progressing   Problem: Health Behavior/Discharge Planning: Goal: Ability to manage health-related needs will improve Outcome: Progressing   Problem: Clinical Measurements: Goal: Diagnostic test results will improve Outcome: Progressing Goal: Cardiovascular complication will be avoided Outcome: Progressing   Problem: Elimination: Goal: Will not experience complications related to bowel motility Outcome: Progressing   Problem: Pain Managment: Goal: General experience of comfort will improve and/or be controlled Outcome: Progressing   Problem: Safety: Goal: Ability to remain free from injury will improve Outcome: Progressing   Problem: Activity: Goal: Ability to return to baseline activity level will improve Outcome: Progressing

## 2023-08-02 NOTE — Progress Notes (Signed)
 Physical Therapy Treatment Patient Details Name: Natasha Davis MRN: [id number] DOB: 03/24/1955 Today's Date: 08/02/2023   History of Present Illness Pt is a 68 y.o. F who presents 07/22/2023 with worsening headache. On presentation, CT head showed acute bilateral subdural hematoma with some shift. S/p bilateral MMA embolization 7/1. PMH includes: CVA (admission 6/23-6/25/2025 for breakthrough seizure and acute CVA), HTN, seizure.    PT Comments  Pt received sitting EOB and agreeable to session. Pt able to tolerate gait trial with focus on L side awareness and position of LUE to improve L drift. Pt demonstrates limited carryover of cues, but demonstrates improved correction of drift. Pt reports dizziness during ambulation that becomes worse sitting EOB at end of trial, BP stable, so pt requests to return to supine. Pt continues to benefit from PT services to progress toward functional mobility goals.    If plan is discharge home, recommend the following: A little help with walking and/or transfers;A little help with bathing/dressing/bathroom;Assistance with cooking/housework;Help with stairs or ramp for entrance;Assist for transportation;Direct supervision/assist for financial management;Direct supervision/assist for medications management   Can travel by private vehicle        Equipment Recommendations  Rolling walker (2 wheels)    Recommendations for Other Services       Precautions / Restrictions Precautions Precautions: Fall Recall of Precautions/Restrictions: Intact Restrictions Weight Bearing Restrictions Per Provider Order: No     Mobility  Bed Mobility Overal bed mobility: Needs Assistance Bed Mobility: Sit to Supine       Sit to supine: HOB elevated, Used rails, Contact guard assist   General bed mobility comments: heavy use of rails, but no physical assist needed    Transfers Overall transfer level: Needs assistance Equipment used: Rolling walker (2  wheels) Transfers: Sit to/from Stand Sit to Stand: Contact guard assist                Ambulation/Gait Ambulation/Gait assistance: Contact guard assist Gait Distance (Feet): 200 Feet Assistive device: Rolling walker (2 wheels) Gait Pattern/deviations: Step-through pattern, Decreased stride length, Drifts right/left Gait velocity: decreased     General Gait Details: Cues for RW proximity, L side awareness, RW management, and obstacle negotiation due to L drift. Cues for UE positioning to reduce L drift, but no carryover. Pt able to self correct drift more this session   Stairs             Wheelchair Mobility     Tilt Bed    Modified Rankin (Stroke Patients Only) Modified Rankin (Stroke Patients Only) Pre-Morbid Rankin Score: No significant disability Modified Rankin: Moderately severe disability     Balance Overall balance assessment: Needs assistance Sitting-balance support: Feet supported Sitting balance-Leahy Scale: Good     Standing balance support: During functional activity, Bilateral upper extremity supported Standing balance-Leahy Scale: Poor Standing balance comment: Rw support                            Communication Communication Communication: No apparent difficulties  Cognition Arousal: Alert Behavior During Therapy: WFL for tasks assessed/performed   PT - Cognitive impairments: Memory, Awareness, Attention, Problem solving                         Following commands: Impaired Following commands impaired: Follows one step commands with increased time, Follows multi-step commands inconsistently    Cueing Cueing Techniques: Verbal cues  Exercises      General  Comments General comments (skin integrity, edema, etc.): Pt reports dizziness during ambulation. BP 130/64 sitting EOB after gait trial      Pertinent Vitals/Pain Pain Assessment Pain Assessment: No/denies pain     PT Goals (current goals can now be  found in the care plan section) Acute Rehab PT Goals Patient Stated Goal: to walk PT Goal Formulation: With patient Time For Goal Achievement: 08/10/23 Progress towards PT goals: Progressing toward goals    Frequency    Min 2X/week       AM-PAC PT 6 Clicks Mobility   Outcome Measure  Help needed turning from your back to your side while in a flat bed without using bedrails?: A Little Help needed moving from lying on your back to sitting on the side of a flat bed without using bedrails?: A Little Help needed moving to and from a bed to a chair (including a wheelchair)?: A Little Help needed standing up from a chair using your arms (e.g., wheelchair or bedside chair)?: A Little Help needed to walk in hospital room?: A Little Help needed climbing 3-5 steps with a railing? : A Little 6 Click Score: 18    End of Session Equipment Utilized During Treatment: Gait belt Activity Tolerance: Patient tolerated treatment well Patient left: in bed;with call bell/phone within reach;with bed alarm set;with family/visitor present Nurse Communication: Mobility status PT Visit Diagnosis: Unsteadiness on feet (R26.81)     Time: 8650-8594 PT Time Calculation (min) (ACUTE ONLY): 16 min  Charges:    $Gait Training: 8-22 mins PT General Charges $$ ACUTE PT VISIT: 1 Visit                     Darryle George, PTA Acute Rehabilitation Services Secure Chat Preferred  [phone]    Darryle George 08/02/2023, 2:45 PM

## 2023-08-02 NOTE — Progress Notes (Signed)
 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  Allergies  Allergen Reactions   Penicillins Other (See Comments)    Couldn't walk    Patient Measurements: Height: (P) 4' 11 (149.9 cm) Weight: 96.1 kg (211 lb 13.8 oz) IBW/kg (Calculated) : (P) 43.2 HEPARIN  DW (KG): (P) 66.7  Vital Signs: Temp: 98.8 F (37.1 C) (07/08 0806) Temp Source: Oral (07/08 0806) BP: 149/65 (07/08 0806) Pulse Rate: 66 (07/08 0806)  Labs: Recent Labs    07/31/23 0841 08/01/23 0448 08/01/23 1151 08/02/23 0452  HGB 10.7* 10.4*  --   --   HCT 32.3* 32.1*  --   --   PLT 360 366  --   --   LABPROT 15.4* 16.4*  --  17.3*  INR 1.2 1.3*  --  1.3*  HEPARINUNFRC 0.53 0.36 0.51 0.49  CREATININE 0.74 0.76  --   --     Estimated Creatinine Clearance: 67.5 mL/min (by C-G formula based on SCr of 0.76 mg/dL).  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 Held on admit due to SDH. S/p embolization on 07/26/23.Natasha Davis Cleared to resume warfarin and heparin  bridge on 07/28/23 after repeat head CT.  Targeting low therapeutic heparin  levels.  PTA warfarin regimen: 5 mg daily; last dose 07/22/23 prior to admit.  7/8 AM: INR 1.3 following 3 x 7.5 mg doses with not much movement in INR. Will provide an increased . Heparin  level therapeutic this AM at 0.49 with heparin  running at 1,250 units/hour. No signs of bleeding or issues with the heparin  infusion noted.    Goal of Therapy:  Heparin  level 0.3-0.5 units/ml INR 2.5-3.5 Monitor platelets by anticoagulation protocol: Yes   Plan:  Continue heparin  drip at 1250 units/hr Warfarin 10 mg x 1 again today Daily heparin  level, PT/INR and CBC. Monitor for signs/symptoms of bleeding.   Natasha Davis, PharmD Clinical Pharmacist  08/02/2023 11:31 AM

## 2023-08-02 NOTE — Progress Notes (Signed)
 Progress Note    Natasha Davis   FMW:[id number]  DOB: 02-19-1955  DOA: 07/22/2023     11 PCP: Marvine Rush, MD  Initial CC: Headache  Hospital Course: 68 y.o. female with medical history significant of seizure disorder, acute CVA (required admission from 07/18/23-07/20/23 for breakthrough seizure and acute CVA requiring increase in dose of Keppra  by neurology), history of mitral valve replacement on Coumadin , GERD, hyperlipidemia, paroxysmal atrial fibrillation, HFrEF 40 to 45% and morbid obesity presented with worsening headache.   On presentation, CT head showed acute bilateral subdural hematoma. Neurosurgery was consulted and recommended to hold Lovenox  and warfarin and recommended to repeat CT head in 8 to 10 hours.   Cardiology was also consulted. Subsequently, patient had breakthrough seizures: Keppra  dose was increased.  Neurology was consulted; LTM EEG was negative for seizures.  LTM EEG discontinued.  Neurology signed off on 07/24/2023.   Assessment & Plan:   Subdural hematoma Intractable headache - bilateral SDH noted on CT head - Evaluated by neurosurgery and ultimately underwent embolization of right and left middle meningeal artery on 07/26/2023 in efforts to alleviate time off of anticoagulation - CT head repeated 7/3 and discussed with neurosurgery.  Okay to resume heparin /Coumadin  and monitoring - Outpatient follow-up with neurosurgery planned for repeat imaging as well  Hx Mechanical MVR - for hx mitral stenosis - heparin  started 7/3 and first dose Coumadin  given 7/4 - Continue heparin  while awaiting therapeutic INR  History of recent acute incidental CVA - In the setting of subtherapeutic INR and the patient with mechanical mitral valve -MRI brain from 07/18/2023 noted acute/subacute cortical infarct involving left parietal lobe; chronic left thalamic infarct also noted - Continue statin   Breakthrough seizure Hx seizure disorder - breakthru seizures in the  hospital on 07/23/2023 - Neurology was consulted.  Keppra  dose has been increased to 750 mg BID. LTM EEG was negative for seizures.  LTM EEG discontinued - Neurology signed off on 07/24/2023. Seizure precaution.  Outpatient follow with neurology    Persistent A-fib Prolonged QT interval - Currently rate controlled.  Continue metoprolol  -Continuing anticoagulation as noted above - Cardiology followed, appreciate assistance  HLD - continue statin  Normocytic anemia -Prior baseline around 11 to 12 g/dL -Remaining stable and near baseline   Chronic combined systolic and diastolic CHF Hypertension - Currently compensated - GDMT per cardiology   Elevated LFTs --questionable cause.  Mild.  Improving    Hyponatremia - stable - trend   Leukocytosis - Possibly reactive.  Improving.  Monitor   Morbid obesity - Complicates overall prognosis and care - Body mass index is 42.84 kg/m (pended).  Interval History:  No events overnight.  Still has mild headache but overall is tolerable. Stable at 4/10.   In general, she does state that she is feeling better each day. Awaiting therapeutic INR prior to discharge.  Old records reviewed in assessment of this patient  Antimicrobials:   DVT prophylaxis:  SCDs Start: 07/22/23 2301 Place TED hose Start: 07/22/23 2301 warfarin (COUMADIN ) tablet 10 mg   Code Status:   Code Status: Full Code  Mobility Assessment (Last 72 Hours)     Mobility Assessment     Row Name 08/02/23 0806 08/01/23 2030 08/01/23 1818 08/01/23 1600 08/01/23 1200   Does patient have an order for bedrest or is patient medically unstable No - Continue assessment No - Continue assessment -- No - Continue assessment No - Continue assessment   What is the highest level of mobility based  on the progressive mobility assessment? Level 5 (Walks with assist in room/hall) - Balance while stepping forward/back and can walk in room with assist - Complete Level 5 (Walks with  assist in room/hall) - Balance while stepping forward/back and can walk in room with assist - Complete Level 5 (Walks with assist in room/hall) - Balance while stepping forward/back and can walk in room with assist - Complete Level 5 (Walks with assist in room/hall) - Balance while stepping forward/back and can walk in room with assist - Complete Level 5 (Walks with assist in room/hall) - Balance while stepping forward/back and can walk in room with assist - Complete   Is the above level different from baseline mobility prior to current illness? Yes - Recommend PT order Yes - Recommend PT order -- Yes - Recommend PT order Yes - Recommend PT order    Row Name 08/01/23 0803 08/01/23 0800 07/31/23 2050 07/31/23 0800 07/30/23 2031   Does patient have an order for bedrest or is patient medically unstable No - Continue assessment No - Continue assessment No - Continue assessment No - Continue assessment No - Continue assessment   What is the highest level of mobility based on the progressive mobility assessment? Level 5 (Walks with assist in room/hall) - Balance while stepping forward/back and can walk in room with assist - Complete Level 5 (Walks with assist in room/hall) - Balance while stepping forward/back and can walk in room with assist - Complete Level 5 (Walks with assist in room/hall) - Balance while stepping forward/back and can walk in room with assist - Complete Level 5 (Walks with assist in room/hall) - Balance while stepping forward/back and can walk in room with assist - Complete Level 5 (Walks with assist in room/hall) - Balance while stepping forward/back and can walk in room with assist - Complete   Is the above level different from baseline mobility prior to current illness? Yes - Recommend PT order Yes - Recommend PT order -- Yes - Recommend PT order Yes - Recommend PT order      Barriers to discharge: None Disposition Plan: None HH orders placed: TBD Status is:  Inpatient  Objective: Blood pressure (!) 152/73, pulse 69, temperature 98.2 F (36.8 C), temperature source Oral, resp. rate 19, height (P) 4' 11 (1.499 m), weight 96.1 kg, SpO2 94%.  Examination:  Physical Exam Constitutional:      Appearance: Normal appearance.  HENT:     Head: Normocephalic and atraumatic.     Mouth/Throat:     Mouth: Mucous membranes are moist.  Eyes:     Extraocular Movements: Extraocular movements intact.  Cardiovascular:     Rate and Rhythm: Normal rate and regular rhythm.  Pulmonary:     Effort: Pulmonary effort is normal. No respiratory distress.     Breath sounds: Normal breath sounds. No wheezing.  Abdominal:     General: Bowel sounds are normal. There is no distension.     Palpations: Abdomen is soft.     Tenderness: There is no abdominal tenderness.  Musculoskeletal:        General: Normal range of motion.     Cervical back: Normal range of motion and neck supple.  Skin:    General: Skin is warm and dry.  Neurological:     Mental Status: She is alert.     Comments:  4/5 strength in LUE/LLE and paresthesias in both left upper and lower extremities per patient  Psychiatric:  Mood and Affect: Mood normal.      Consultants:  Neurosurgery Cardiology, signed off 08/01/2023  Procedures:  07/26/2023 PROCEDURE: Onyx embolization of right middle meningeal artery  Onyx embolization of left middle meningeal artery   Data Reviewed: Results for orders placed or performed during the hospital encounter of 07/22/23 (from the past 24 hours)  Protime-INR     Status: Abnormal   Collection Time: 08/02/23  4:52 AM  Result Value Ref Range   Prothrombin Time 17.3 (H) 11.4 - 15.2 seconds   INR 1.3 (H) 0.8 - 1.2  Heparin  level (unfractionated)     Status: None   Collection Time: 08/02/23  4:52 AM  Result Value Ref Range   Heparin  Unfractionated 0.49 0.30 - 0.70 IU/mL    I have reviewed pertinent nursing notes, vitals, labs, and images as necessary. I  have ordered labwork to follow up on as indicated.  I have reviewed the last notes from staff over past 24 hours. I have discussed patient's care plan and test results with nursing staff, CM/SW, and other staff as appropriate.  Time spent: Greater than 50% of the 55 minute visit was spent in counseling/coordination of care for the patient as laid out in the A&P.   LOS: 11 days   Alm Apo, MD [facility] 08/02/2023, 1:46 PM

## 2023-08-03 ENCOUNTER — Other Ambulatory Visit (HOSPITAL_COMMUNITY): Payer: Self-pay

## 2023-08-03 DIAGNOSIS — S065XAA Traumatic subdural hemorrhage with loss of consciousness status unknown, initial encounter: Secondary | ICD-10-CM | POA: Diagnosis not present

## 2023-08-03 LAB — BASIC METABOLIC PANEL WITH GFR
Anion gap: 12 (ref 5–15)
BUN: 13 mg/dL (ref 8–23)
CO2: 28 mmol/L (ref 22–32)
Calcium: 10.5 mg/dL — ABNORMAL HIGH (ref 8.9–10.3)
Chloride: 91 mmol/L — ABNORMAL LOW (ref 98–111)
Creatinine, Ser: 0.83 mg/dL (ref 0.44–1.00)
GFR, Estimated: >60 mL/min (ref >60)
Glucose, Bld: 179 mg/dL — ABNORMAL HIGH (ref 70–99)
Potassium: 4.4 mmol/L (ref 3.5–5.1)
Sodium: 131 mmol/L — ABNORMAL LOW (ref 135–145)

## 2023-08-03 LAB — CBC
HCT: 32.3 % — ABNORMAL LOW (ref 36.0–46.0)
Hemoglobin: 10.5 g/dL — ABNORMAL LOW (ref 12.0–15.0)
MCH: 30.5 pg (ref 26.0–34.0)
MCHC: 32.5 g/dL (ref 30.0–36.0)
MCV: 93.9 fL (ref 80.0–100.0)
Platelets: 420 K/uL — ABNORMAL HIGH (ref 150–400)
RBC: 3.44 MIL/uL — ABNORMAL LOW (ref 3.87–5.11)
RDW: 13.1 % (ref 11.5–15.5)
WBC: 14.4 K/uL — ABNORMAL HIGH (ref 4.0–10.5)
nRBC: 0 % (ref 0.0–0.2)

## 2023-08-03 LAB — HEPARIN LEVEL (UNFRACTIONATED): Heparin Unfractionated: 0.47 [IU]/mL (ref 0.30–0.70)

## 2023-08-03 LAB — PROTIME-INR
INR: 1.7 — ABNORMAL HIGH (ref 0.8–1.2)
Prothrombin Time: 20.7 s — ABNORMAL HIGH (ref 11.4–15.2)

## 2023-08-03 MED ORDER — CARBAMIDE PEROXIDE 6.5 % OT SOLN
5.0000 [drp] | Freq: Two times a day (BID) | OTIC | Status: AC
  Administered 2023-08-03 – 2023-08-05 (×6): 5 [drp] via OTIC
  Filled 2023-08-03: qty 15

## 2023-08-03 MED ORDER — WARFARIN SODIUM 5 MG PO TABS
5.0000 mg | ORAL_TABLET | Freq: Once | ORAL | Status: AC
  Administered 2023-08-03: 5 mg via ORAL
  Filled 2023-08-03: qty 1

## 2023-08-03 NOTE — Progress Notes (Signed)
 Occupational Therapy Treatment Patient Details Name: Natasha Davis MRN: [id number] DOB: 25-May-1955 Today's Date: 08/03/2023   History of present illness Pt is a 68 y.o. F who presents 07/22/2023 with worsening headache. On presentation, CT head showed acute bilateral subdural hematoma with some shift. S/p bilateral MMA embolization 7/1. PMH includes: CVA (admission 6/23-6/25/2025 for breakthrough seizure and acute CVA), HTN, seizure.   OT comments  Pt making steady progress towards OT goals this session. Pt continues to present with LUE weakness and impaired activity tolerance. Session focused on BADL reeducation, education of HEP with a focus on global strength as well as improved intrinsic strength for hemiparetic LUE. Pt completed below therex with level 1 theraband as well as therex with level 1 theraputty. Pt needed MAX multimodal cues to complete HEP with accuracy. Pt completed functional ambulation in room with no AD and CGA. Pt completed 3/3 toileting tasks with supervision and opted to sit for grooming tasks d/t fatigue. Discussed DME needs as indicated below with son and pt as well as recommendation for 24/7 supervision with ADLs and functional mobility. Pt would also benefit from HHOT. Will continue to follow acutely for OT needs.        If plan is discharge home, recommend the following:  A little help with bathing/dressing/bathroom;Assistance with cooking/housework;Direct supervision/assist for medications management;Direct supervision/assist for financial management;Assist for transportation;Help with stairs or ramp for entrance   Equipment Recommendations  Tub/shower seat;Other (comment) (RW)    Recommendations for Other Services      Precautions / Restrictions Precautions Precautions: Fall Restrictions Weight Bearing Restrictions Per Provider Order: No       Mobility Bed Mobility               General bed mobility comments: sitting EOB at start of session     Transfers Overall transfer level: Needs assistance Equipment used: None Transfers: Sit to/from Stand Sit to Stand: Contact guard assist           General transfer comment: CGA for safety and line mgmt     Balance Overall balance assessment: Needs assistance Sitting-balance support: Feet supported Sitting balance-Leahy Scale: Good     Standing balance support: During functional activity, Single extremity supported Standing balance-Leahy Scale: Poor Standing balance comment: required unilateral support for ADL participation                           ADL either performed or assessed with clinical judgement   ADL Overall ADL's : Needs assistance/impaired     Grooming: Wash/dry face;Wash/dry hands;Oral care;Standing Grooming Details (indicate cue type and reason): supervison for safety; requested to sit d/t fatigue     Lower Body Bathing: Supervison/ safety;Sit to/from stand Lower Body Bathing Details (indicate cue type and reason): simulated via pericare         Toilet Transfer: Contact guard assist;Ambulation;Regular Toilet;Grab bars Toilet Transfer Details (indicate cue type and reason): CGA for safety Toileting- Clothing Manipulation and Hygiene: Supervision/safety;Sit to/from Nurse, children's Details (indicate cue type and reason): discussed use of TTB for home, showed pt and son picture to purchase for home Functional mobility during ADLs: Contact guard assist General ADL Comments: ADL participation mostly impacted by decreased activity tolerance    Extremity/Trunk Assessment Upper Extremity Assessment Upper Extremity Assessment: Right hand dominant;LUE deficits/detail;Generalized weakness LUE Deficits / Details: L UE hx of decreased coordination from prior CVA. decreased FTN coord. Weaker at baseline from prior CVA.  SHoudler flexion 3+/5, elbow flex/ext 4/5 MMT.   Lower Extremity Assessment Lower Extremity Assessment: Defer to PT  evaluation        Vision Baseline Vision/History: 1 Wears glasses Ability to See in Adequate Light: 0 Adequate Patient Visual Report: No change from baseline     Perception Perception Perception: Impaired Preception Impairment Details: Inattention/Neglect Perception-Other Comments: L inattention   Praxis Praxis Praxis: WFL   Communication Communication Communication: No apparent difficulties   Cognition Arousal: Alert Behavior During Therapy: WFL for tasks assessed/performed               OT - Cognition Comments: overall WFL for tasks assessed. pt did need increased cues to sequence through HEP                 Following commands: Impaired Following commands impaired: Only follows one step commands consistently, Follows multi-step commands with increased time      Cueing   Cueing Techniques: Verbal cues, Gestural cues, Tactile cues, Visual cues (for accuracy with HEP)  Exercises General Exercises - Upper Extremity Shoulder Flexion: Strengthening, Left, 10 reps, Seated, Theraband Theraband Level (Shoulder Flexion): Level 1 (Yellow) Shoulder Extension: Strengthening, Left, 10 reps, Seated, Theraband Theraband Level (Shoulder Extension): Level 1 (Yellow) Shoulder Horizontal ABduction: Strengthening, Both, 10 reps, Seated, Theraband Theraband Level (Shoulder Horizontal Abduction): Level 1 (Yellow) Shoulder Horizontal ADduction: Strengthening, Both, 10 reps, Seated, Theraband Theraband Level (Shoulder Horizontal Adduction): Level 1 (Yellow) Elbow Flexion: Strengthening, Both, 10 reps, Seated, Theraband Theraband Level (Elbow Flexion): Level 1 (Yellow) Elbow Extension: Strengthening, Both, 10 reps, Seated, Theraband Theraband Level (Elbow Extension): Level 1 (Yellow) Other Exercises Other Exercises: diagonal pulls with level 1 theraband from sitting position EOB x10 reps   HOME EXERCISE PROGRAM  Theraputty Exercises  **Do the following exercises using your  affected hand.  1. Roll putty into a ball.   2. Make into a pancake.   3. Roll into a log.   4. Pinch along log with first finger and thumb.   5. Make into a ball.   6. Roll it back into a log.   7. Pinch using thumb and side of first finger.   8. Roll it into a ball, then flatten into a pancake.   9. Using your fingers, make putty into a mountain.   10. Hide several coins or marbles in putty, then pick them out one by one.     Shoulder Instructions       General Comments pt endorses dizziness throughout session, son present during session,education provided to caregiver about recommendation for 24/7 supervision and making sure pt has available places to sit when in transition from one room>another. discussed recommendation of not sitting for longer than 1 hour i.e short bouts of mobility    Pertinent Vitals/ Pain       Pain Assessment Pain Assessment: No/denies pain  Home Living                                          Prior Functioning/Environment              Frequency  Min 2X/week        Progress Toward Goals  OT Goals(current goals can now be found in the care plan section)  Progress towards OT goals: Progressing toward goals  Acute Rehab OT Goals Patient Stated Goal: to go home OT Goal Formulation:  With patient Time For Goal Achievement: 08/11/23 Potential to Achieve Goals: Good  Plan      Co-evaluation                 AM-PAC OT 6 Clicks Daily Activity     Outcome Measure   Help from another person eating meals?: None Help from another person taking care of personal grooming?: A Little Help from another person toileting, which includes using toliet, bedpan, or urinal?: A Little Help from another person bathing (including washing, rinsing, drying)?: A Little Help from another person to put on and taking off regular upper body clothing?: A Little Help from another person to put on and taking off regular lower body  clothing?: A Little 6 Click Score: 19    End of Session Equipment Utilized During Treatment: Gait belt  OT Visit Diagnosis: Other abnormalities of gait and mobility (R26.89);Unsteadiness on feet (R26.81)   Activity Tolerance Patient tolerated treatment well   Patient Left in bed;with call bell/phone within reach;with bed alarm set;with nursing/sitter in room   Nurse Communication Mobility status;Other (comment) (nurse present at end of session)        Time: [phone] OT Time Calculation (min): 36 min  Charges: OT General Charges $OT Visit: 1 Visit OT Treatments $Self Care/Home Management : 8-22 mins $Therapeutic Exercise: 8-22 mins  Ronal Mallie POUR., COTA/L Acute Rehabilitation Services [phone]   Ronal Mallie Needy 08/03/2023, 9:39 AM

## 2023-08-03 NOTE — Progress Notes (Signed)
 OT Cancellation Note  Patient Details Name: Natasha Davis MRN: [id number] DOB: 04/29/1955   Cancelled Treatment:    Reason Eval/Treat Not Completed: Patient at procedure or test/ unavailable;Other (comment) (pt eating breakfast)  Pt greeted EOB eating breakfast, politely requesting OTA to return later in the day. Will f/u as time allows for OT session.   Ronal Mallie POUR., COTA/L Acute Rehabilitation Services [phone]  Ronal Mallie Needy 08/03/2023, 8:08 AM

## 2023-08-03 NOTE — Progress Notes (Signed)
 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  Allergies  Allergen Reactions   Penicillins Other (See Comments)    Couldn't walk    Patient Measurements: Height: (P) 4' 11 (149.9 cm) Weight: 97.1 kg (214 lb 1.1 oz) IBW/kg (Calculated) : (P) 43.2 HEPARIN  DW (KG): (P) 66.7  Vital Signs: Temp: 99.1 F (37.3 C) (07/09 0820) Temp Source: Oral (07/09 0820) BP: 156/86 (07/09 0820) Pulse Rate: 85 (07/09 0820)  Labs: Recent Labs    08/01/23 0448 08/01/23 1151 08/02/23 0452 08/03/23 0344 08/03/23 0832  HGB 10.4*  --   --  10.5*  --   HCT 32.1*  --   --  32.3*  --   PLT 366  --   --  420*  --   LABPROT 16.4*  --  17.3* 20.7*  --   INR 1.3*  --  1.3* 1.7*  --   HEPARINUNFRC 0.36 0.51 0.49 0.47  --   CREATININE 0.76  --   --   --  0.83    Estimated Creatinine Clearance: 65.4 mL/min (by C-G formula based on SCr of 0.83 mg/dL).  Assessment: 28 YOF w/ PMH significant for PAF and mechanical MVR on Warfarin PTA. Held on admit due to SDH. S/p embolization on 07/26/23.SABRA Cleared to resume warfarin and heparin  bridge on 07/28/23 after repeat head CT.  Targeting low therapeutic heparin  levels.  PTA warfarin regimen: 5 mg daily; last dose 07/22/23 prior to admit.  7/9 AM: INR with increase from 1.3>>1.7 following 4 days of doses higher than PTA regimen and little to no upward movement in INR. Will provide a dose per her PTA regimen and assess trend in AM. Anticipate INR will continue to trend upward d/t 7.5 mg doses.  Documented as eating 100% of her meals. Heparin  level therapeutic this AM at 0.47 with heparin  running at 1,250 units/hour. No signs of bleeding or issues with the heparin  infusion noted. CBC stable.    Goal of Therapy:  Heparin  level 0.3-0.5 units/ml INR 2.5-3.5 Monitor platelets by anticoagulation protocol: Yes   Plan:  Continue heparin  drip at 1250 units/hr Warfarin 5 mg x 1  today Daily heparin  level, PT/INR and CBC. Monitor for signs/symptoms of bleeding.   Massie Fila, PharmD Clinical Pharmacist  08/03/2023 10:28 AM

## 2023-08-03 NOTE — Progress Notes (Signed)
 PROGRESS NOTE    Natasha Davis  FMW:[id number] DOB: [birth year] DOA: 07/22/2023 PCP: Marvine Rush, MD   Brief Narrative: 68 year old with past medical history significant for seizure disorder, acute CVA (required admission from 07/18/2023 through 07/20/2023 for breakthrough seizure and acute CVA require increased dose of Keppra .  History of mitral valve replacement on Coumadin , GERD, hyperlipidemia, paroxysmal A-fib, heart failure reduced ejection fraction 40 to 45%, morbid obesity presenting with worsening headache.  On presentation CT head show acute bilateral subdural hematoma.  Neurosurgery was consulted and recommended to hold Lovenox  and warfarin and recommended to repeat CT head in 8 to 10 hours.  Cardiology was also consulted.  Subsequently patient had breakthrough seizure.  Keppra  dose was increased.  Neurology was consulted.  LTM EEG was negative for seizure.  LTM EEG discontinued.  Neurology signed off on 07/24/2023.   Assessment & Plan:   Principal Problem:   Subdural hematoma (HCC) Active Problems:   H/O mitral valve replacement with mechanical valve   PAF (paroxysmal atrial fibrillation) (HCC)   Seizure disorder (HCC)   S/P MVR (mitral valve replacement)   Anticoagulation management encounter   Essential hypertension   History of CVA (cerebrovascular accident)   Combined systolic and diastolic congestive heart failure (HCC)   1-Subdural hematoma, intractable headache -Bilateral SDH on CT head.  - Evaluated by neurosurgery and ultimately underwent embolization of the right and left middle meningeal artery on 07/26/2023 in an effort to alleviate time of anticoagulation. - CT head repeated 7/3, discussed with neurosurgery at that time, okay to resume heparin  Coumadin  monitoring. - Outpatient follow-up with neurosurgery planning for repeat imaging as well.  History of mechanical MVR - In the setting of supratherapeutic INR and the patient with a mechanical mitral valve. -  Heparin  was started 7/3 and first dose of Coumadin  7/4 - Continue with bridge - INR goal 2.5-3  History of recent acute incidental CVA -- MRI of the brain 07/18/2023 noted acute/subacute cortical infarct involving the left parietal lobe, chronic left thalamic infarcts. - Continue statins  Breakthrough seizures History of seizure disorder - Breakthrough seizure in the hospital on 07/23/2023 - Neurology was consulted, Keppra  dose has been increased 750 mg twice daily - LTM EEG was negative for seizures.  He has been discontinued. - Neurology has signed off, outpatient follow-up with neurology   Persistent A-fib Prolonged QT interval Continue metoprolol  Continue anticoagulation as above  Hyperlipidemia - Continue statins  Normocytic anemia - Hemoglobin baseline 11-12 Continue to monitor  Chronic combined systolic and diastolic heart failure Hypertension - Compensated. -GDMT per cardiology  Transaminases:  Unclear etiology, mild follow trend  Hyponatremia - Stable.  Monitor  Leukocytosis:  Reactive, improved.  Morbid Obesity:  Needs lifestyle modification BMI 42      Estimated body mass index is 43.24 kg/m (pended) as calculated from the following:   Height as of this encounter: (P) 4' 11 (1.499 m).   Weight as of this encounter: 97.1 kg.   DVT prophylaxis: Heparin  drip Code Status: Full code Family Communication: Sister who was at bedside Disposition Plan:  Status is: Inpatient Remains inpatient appropriate because: Waiting INR to be therapeutic.  To discharge home    Consultants:  Neurology cardiology  Procedures:    Antimicrobials:    Subjective: She is alert, conversant. Report some headaches.   Objective: Vitals:   08/02/23 1931 08/03/23 0022 08/03/23 0339 08/03/23 0500  BP: (!) 131/52 (!) 157/81 131/65   Pulse: (!) 58 77 70   Resp: 18  18 18   Temp: 98.1 F (36.7 C) 99.2 F (37.3 C) 98.6 F (37 C)   TempSrc: Oral Oral Oral    SpO2: 98% 98% 97%   Weight:    97.1 kg  Height:        Intake/Output Summary (Last 24 hours) at 08/03/2023 0804 Last data filed at 08/03/2023 0555 Gross per 24 hour  Intake 697.3 ml  Output --  Net 697.3 ml   Filed Weights   07/31/23 0716 08/01/23 0500 08/03/23 0500  Weight: 99 kg 96.1 kg 97.1 kg    Examination:  General exam: Appears calm and comfortable  Respiratory system: Clear to auscultation. Respiratory effort normal. Cardiovascular system: S1 & S2 heard, RRR.  Gastrointestinal system: Abdomen is nondistended, soft and nontender. No organomegaly or masses felt. Normal bowel sounds heard. Central nervous system: Alert and oriented.  Extremities: Symmetric 5 x 5 power.   Data Reviewed: I have personally reviewed following labs and imaging studies  CBC: Recent Labs  Lab 07/28/23 0424 07/29/23 0400 07/30/23 0609 07/31/23 0841 08/01/23 0448 08/03/23 0344  WBC 12.5* 14.6* 12.8* 14.9* 14.1* 14.4*  NEUTROABS 9.8* 11.3* 9.9* 11.8* 10.8*  --   HGB 10.8* 10.6* 9.6* 10.7* 10.4* 10.5*  HCT 33.8* 32.6* 29.1* 32.3* 32.1* 32.3*  MCV 94.4 94.8 93.0 93.9 95.3 93.9  PLT 304 310 339 360 366 420*   Basic Metabolic Panel: Recent Labs  Lab 07/28/23 0424 07/29/23 0400 07/30/23 0609 07/31/23 0841 08/01/23 0448  NA 131* 129* 129* 128* 130*  K 3.9 4.4 4.4 4.4 4.8  CL 95* 93* 94* 92* 92*  CO2 25 24 26 23 27   GLUCOSE 98 125* 127* 120* 121*  BUN 21 18 14 13 12   CREATININE 1.07* 0.79 0.81 0.74 0.76  CALCIUM  8.8* 8.7* 8.8* 9.3 9.1  MG 2.1 2.0 1.9 1.8 2.1   GFR: Estimated Creatinine Clearance: 67.9 mL/min (by C-G formula based on SCr of 0.76 mg/dL). Liver Function Tests: Recent Labs  Lab 07/30/23 0609  AST 40  ALT 62*  ALKPHOS 95  BILITOT 0.9  PROT 6.5  ALBUMIN 2.9*   No results for input(s): LIPASE, AMYLASE in the last 168 hours. No results for input(s): AMMONIA in the last 168 hours. Coagulation Profile: Recent Labs  Lab 07/30/23 0609 07/31/23 0841  08/01/23 0448 08/02/23 0452 08/03/23 0344  INR 1.2 1.2 1.3* 1.3* 1.7*   Cardiac Enzymes: No results for input(s): CKTOTAL, CKMB, CKMBINDEX, TROPONINI in the last 168 hours. BNP (last 3 results) No results for input(s): PROBNP in the last 8760 hours. HbA1C: No results for input(s): HGBA1C in the last 72 hours. CBG: No results for input(s): GLUCAP in the last 168 hours. Lipid Profile: No results for input(s): CHOL, HDL, LDLCALC, TRIG, CHOLHDL, LDLDIRECT in the last 72 hours. Thyroid  Function Tests: No results for input(s): TSH, T4TOTAL, FREET4, T3FREE, THYROIDAB in the last 72 hours. Anemia Panel: No results for input(s): VITAMINB12, FOLATE, FERRITIN, TIBC, IRON, RETICCTPCT in the last 72 hours. Sepsis Labs: No results for input(s): PROCALCITON, LATICACIDVEN in the last 168 hours.  Recent Results (from the past 240 hours)  Surgical pcr screen     Status: None   Collection Time: 07/25/23  4:14 PM   Specimen: Nasal Mucosa; Nasal Swab  Result Value Ref Range Status   MRSA, PCR NEGATIVE NEGATIVE Final   Staphylococcus aureus NEGATIVE NEGATIVE Final    Comment: (NOTE) The Xpert SA Assay (FDA approved for NASAL specimens in patients 23 years of age and older),  is one component of a comprehensive surveillance program. It is not intended to diagnose infection nor to guide or monitor treatment. Performed at [facility], [address], [place], [place] [address]          Radiology Studies: No results found.      Scheduled Meds:  atorvastatin   20 mg Oral QHS   furosemide   40 mg Oral Daily   levETIRAcetam   750 mg Oral BID   losartan   50 mg Oral Daily   melatonin  5 mg Oral QHS   metoprolol  succinate  25 mg Oral Daily   pantoprazole   40 mg Oral Daily   sodium chloride  flush  3 mL Intravenous Q12H   sodium chloride  flush  3 mL Intravenous Q12H   spironolactone   25 mg Oral Daily   Warfarin - Pharmacist Dosing  Inpatient   Does not apply q1600   Continuous Infusions:  heparin  1,250 Units/hr (08/02/23 2135)     LOS: 12 days    Time spent: 35 minutes.     Natasha DELENA Lore, MD [facility]   If 7PM-7AM, please contact night-coverage [email or web address]  08/03/2023, 8:04 AM

## 2023-08-03 NOTE — Plan of Care (Signed)
  Problem: Pain Managment: Goal: General experience of comfort will improve and/or be controlled Outcome: Progressing   Problem: Safety: Goal: Ability to remain free from injury will improve Outcome: Progressing   Problem: Skin Integrity: Goal: Risk for impaired skin integrity will decrease Outcome: Progressing   Problem: Activity: Goal: Ability to return to baseline activity level will improve Outcome: Progressing

## 2023-08-04 DIAGNOSIS — S065XAA Traumatic subdural hemorrhage with loss of consciousness status unknown, initial encounter: Secondary | ICD-10-CM | POA: Diagnosis not present

## 2023-08-04 LAB — CBC
HCT: 31.4 % — ABNORMAL LOW (ref 36.0–46.0)
Hemoglobin: 10.2 g/dL — ABNORMAL LOW (ref 12.0–15.0)
MCH: 30.5 pg (ref 26.0–34.0)
MCHC: 32.5 g/dL (ref 30.0–36.0)
MCV: 94 fL (ref 80.0–100.0)
Platelets: 418 K/uL — ABNORMAL HIGH (ref 150–400)
RBC: 3.34 MIL/uL — ABNORMAL LOW (ref 3.87–5.11)
RDW: 13.2 % (ref 11.5–15.5)
WBC: 12.8 K/uL — ABNORMAL HIGH (ref 4.0–10.5)
nRBC: 0 % (ref 0.0–0.2)

## 2023-08-04 LAB — PROTIME-INR
INR: 1.8 — ABNORMAL HIGH (ref 0.8–1.2)
Prothrombin Time: 21.4 s — ABNORMAL HIGH (ref 11.4–15.2)

## 2023-08-04 LAB — HEPARIN LEVEL (UNFRACTIONATED): Heparin Unfractionated: 0.47 [IU]/mL (ref 0.30–0.70)

## 2023-08-04 MED ORDER — MAGNESIUM CITRATE PO SOLN
1.0000 | Freq: Once | ORAL | Status: AC
  Administered 2023-08-04: 1 via ORAL
  Filled 2023-08-04: qty 296

## 2023-08-04 MED ORDER — WARFARIN SODIUM 5 MG PO TABS
10.0000 mg | ORAL_TABLET | Freq: Once | ORAL | Status: AC
  Administered 2023-08-04: 10 mg via ORAL
  Filled 2023-08-04: qty 2

## 2023-08-04 NOTE — Progress Notes (Signed)
 PROGRESS NOTE    Natasha Davis  FMW:[id number] DOB: September 11, 1955 DOA: 07/22/2023 PCP: Marvine Rush, MD   Brief Narrative: 68 year old with past medical history significant for seizure disorder, acute CVA (required admission from 07/18/2023 through 07/20/2023 for breakthrough seizure and acute CVA require increased dose of Keppra .  History of mitral valve replacement on Coumadin , GERD, hyperlipidemia, paroxysmal A-fib, heart failure reduced ejection fraction 40 to 45%, morbid obesity presenting with worsening headache.  On presentation CT head show acute bilateral subdural hematoma.  Neurosurgery was consulted and recommended to hold Lovenox  and warfarin and recommended to repeat CT head in 8 to 10 hours.  Cardiology was also consulted.  Subsequently patient had breakthrough seizure.  Keppra  dose was increased.  Neurology was consulted.  LTM EEG was negative for seizure.  LTM EEG discontinued.  Neurology signed off on 07/24/2023.   Assessment & Plan:   Principal Problem:   Subdural hematoma (HCC) Active Problems:   H/O mitral valve replacement with mechanical valve   PAF (paroxysmal atrial fibrillation) (HCC)   Seizure disorder (HCC)   S/P MVR (mitral valve replacement)   Anticoagulation management encounter   Essential hypertension   History of CVA (cerebrovascular accident)   Combined systolic and diastolic congestive heart failure (HCC)   1-Subdural hematoma, intractable headache -Bilateral SDH on CT head.  - Evaluated by neurosurgery and ultimately underwent embolization of the right and left middle meningeal artery on 07/26/2023 in an effort to alleviate time of anticoagulation. - CT head repeated 7/3, discussed with neurosurgery at that time, okay to resume heparin  Coumadin  monitoring. - Outpatient follow-up with neurosurgery planning for repeat imaging as well.  History of mechanical MVR - In the setting of supratherapeutic INR and the patient with a mechanical mitral valve. -  Heparin  was started 7/3 and first dose of Coumadin  7/4 - Continue with bridge - INR goal 2.5-3 INR slowly increasing today is at 1.8  History of recent acute incidental CVA -- MRI of the brain 07/18/2023 noted acute/subacute cortical infarct involving the left parietal lobe, chronic left thalamic infarcts. - Continue statins  Breakthrough seizures History of seizure disorder - Breakthrough seizure in the hospital on 07/23/2023 - Neurology was consulted, Keppra  dose has been increased 750 mg twice daily - LTM EEG was negative for seizures.  He has been discontinued. - Neurology has signed off, outpatient follow-up with neurology   Persistent A-fib Prolonged QT interval Continue metoprolol  Continue anticoagulation as above  Hyperlipidemia - Continue statins  Normocytic anemia - Hemoglobin baseline 11-12 Continue to monitor  Chronic combined systolic and diastolic heart failure Hypertension - Compensated. -GDMT per cardiology  Transaminases:  Unclear etiology, mild follow trend  Hyponatremia - Stable.  Monitor  Leukocytosis:  Reactive, improved.  Morbid Obesity:  Needs lifestyle modification BMI 42      Estimated body mass index is 42.23 kg/m (pended) as calculated from the following:   Height as of this encounter: (P) 4' 11 (1.499 m).   Weight as of this encounter: 94.8 kg.   DVT prophylaxis: Heparin  drip Code Status: Full code Family Communication: Sister who was at bedside Disposition Plan:  Status is: Inpatient Remains inpatient appropriate because: Waiting INR to be therapeutic.  To discharge home    Consultants:  Neurology cardiology  Procedures:    Antimicrobials:    Subjective: Feels better, headaches is not worse.   Objective: Vitals:   08/04/23 0500 08/04/23 0729 08/04/23 0729 08/04/23 1139  BP:  (!) 126/42 (!) 126/42 135/79  Pulse:  77  77 77  Resp:  16 16 16   Temp:  98.9 F (37.2 C) 98.9 F (37.2 C) 99 F (37.2 C)   TempSrc:  Oral Oral Oral  SpO2:  95% 95% 98%  Weight: 94.8 kg     Height:        Intake/Output Summary (Last 24 hours) at 08/04/2023 1221 Last data filed at 08/04/2023 1209 Gross per 24 hour  Intake 977.8 ml  Output --  Net 977.8 ml   Filed Weights   08/01/23 0500 08/03/23 0500 08/04/23 0500  Weight: 96.1 kg 97.1 kg 94.8 kg    Examination:  General exam: NAD Respiratory system: CTA Cardiovascular system: S 1, S 2 RRR Gastrointestinal system: BS present, soft , nt Central nervous system: alert, and oriented.  Extremities: Symmetric 5 x 5 power.   Data Reviewed: I have personally reviewed following labs and imaging studies  CBC: Recent Labs  Lab 07/29/23 0400 07/30/23 0609 07/31/23 0841 08/01/23 0448 08/03/23 0344 08/04/23 0517  WBC 14.6* 12.8* 14.9* 14.1* 14.4* 12.8*  NEUTROABS 11.3* 9.9* 11.8* 10.8*  --   --   HGB 10.6* 9.6* 10.7* 10.4* 10.5* 10.2*  HCT 32.6* 29.1* 32.3* 32.1* 32.3* 31.4*  MCV 94.8 93.0 93.9 95.3 93.9 94.0  PLT 310 339 360 366 420* 418*   Basic Metabolic Panel: Recent Labs  Lab 07/29/23 0400 07/30/23 0609 07/31/23 0841 08/01/23 0448 08/03/23 0832  NA 129* 129* 128* 130* 131*  K 4.4 4.4 4.4 4.8 4.4  CL 93* 94* 92* 92* 91*  CO2 24 26 23 27 28   GLUCOSE 125* 127* 120* 121* 179*  BUN 18 14 13 12 13   CREATININE 0.79 0.81 0.74 0.76 0.83  CALCIUM  8.7* 8.8* 9.3 9.1 10.5*  MG 2.0 1.9 1.8 2.1  --    GFR: Estimated Creatinine Clearance: 64.4 mL/min (by C-G formula based on SCr of 0.83 mg/dL). Liver Function Tests: Recent Labs  Lab 07/30/23 0609  AST 40  ALT 62*  ALKPHOS 95  BILITOT 0.9  PROT 6.5  ALBUMIN 2.9*   No results for input(s): LIPASE, AMYLASE in the last 168 hours. No results for input(s): AMMONIA in the last 168 hours. Coagulation Profile: Recent Labs  Lab 07/31/23 0841 08/01/23 0448 08/02/23 0452 08/03/23 0344 08/04/23 0517  INR 1.2 1.3* 1.3* 1.7* 1.8*   Cardiac Enzymes: No results for input(s): CKTOTAL,  CKMB, CKMBINDEX, TROPONINI in the last 168 hours. BNP (last 3 results) No results for input(s): PROBNP in the last 8760 hours. HbA1C: No results for input(s): HGBA1C in the last 72 hours. CBG: No results for input(s): GLUCAP in the last 168 hours. Lipid Profile: No results for input(s): CHOL, HDL, LDLCALC, TRIG, CHOLHDL, LDLDIRECT in the last 72 hours. Thyroid  Function Tests: No results for input(s): TSH, T4TOTAL, FREET4, T3FREE, THYROIDAB in the last 72 hours. Anemia Panel: No results for input(s): VITAMINB12, FOLATE, FERRITIN, TIBC, IRON, RETICCTPCT in the last 72 hours. Sepsis Labs: No results for input(s): PROCALCITON, LATICACIDVEN in the last 168 hours.  Recent Results (from the past 240 hours)  Surgical pcr screen     Status: None   Collection Time: 07/25/23  4:14 PM   Specimen: Nasal Mucosa; Nasal Swab  Result Value Ref Range Status   MRSA, PCR NEGATIVE NEGATIVE Final   Staphylococcus aureus NEGATIVE NEGATIVE Final    Comment: (NOTE) The Xpert SA Assay (FDA approved for NASAL specimens in patients 19 years of age and older), is one component of a comprehensive surveillance program. It is not  intended to diagnose infection nor to guide or monitor treatment. Performed at [facility], [address], [place], [place] [address]          Radiology Studies: No results found.      Scheduled Meds:  atorvastatin   20 mg Oral QHS   carbamide peroxide  5 drop Right EAR BID   furosemide   40 mg Oral Daily   levETIRAcetam   750 mg Oral BID   losartan   50 mg Oral Daily   melatonin  5 mg Oral QHS   metoprolol  succinate  25 mg Oral Daily   pantoprazole   40 mg Oral Daily   sodium chloride  flush  3 mL Intravenous Q12H   sodium chloride  flush  3 mL Intravenous Q12H   spironolactone   25 mg Oral Daily   Warfarin - Pharmacist Dosing Inpatient   Does not apply q1600   Continuous Infusions:  heparin  1,250 Units/hr  (08/04/23 1209)     LOS: 13 days    Time spent: 35 minutes.     Owen DELENA Lore, MD [facility]   If 7PM-7AM, please contact night-coverage [email or web address]  08/04/2023, 12:21 PM

## 2023-08-04 NOTE — Progress Notes (Signed)
 PT Cancellation Note  Patient Details Name: Natasha Davis MRN: [id number] DOB: 22-Sep-1955   Cancelled Treatment:    Reason Eval/Treat Not Completed: (P) Patient declined, no reason specified (Pt reports fatigue and requests PTA to come back tomorrow. Will continue to follow per PT POC)   Darryle George 08/04/2023, 1:34 PM

## 2023-08-04 NOTE — Plan of Care (Signed)
  Problem: Education: Goal: Knowledge of General Education information will improve Description: Including pain rating scale, medication(s)/side effects and non-pharmacologic comfort measures Outcome: Progressing   Problem: Clinical Measurements: Goal: Diagnostic test results will improve Outcome: Progressing   Problem: Elimination: Goal: Will not experience complications related to bowel motility Outcome: Progressing   Problem: Pain Managment: Goal: General experience of comfort will improve and/or be controlled Outcome: Progressing

## 2023-08-04 NOTE — Progress Notes (Signed)
 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  Allergies  Allergen Reactions   Penicillins Other (See Comments)    Couldn't walk    Patient Measurements: Height: (P) 4' 11 (149.9 cm) Weight: 94.8 kg (209 lb 1.6 oz) IBW/kg (Calculated) : (P) 43.2 HEPARIN  DW (KG): (P) 66.7  Vital Signs: Temp: 98.9 F (37.2 C) (07/10 0729) Temp Source: Oral (07/10 0729) BP: 126/42 (07/10 0729) Pulse Rate: 77 (07/10 0729)  Labs: Recent Labs    08/02/23 0452 08/03/23 0344 08/03/23 0832 08/04/23 0517  HGB  --  10.5*  --  10.2*  HCT  --  32.3*  --  31.4*  PLT  --  420*  --  418*  LABPROT 17.3* 20.7*  --  21.4*  INR 1.3* 1.7*  --  1.8*  HEPARINUNFRC 0.49 0.47  --  0.47  CREATININE  --   --  0.83  --     Estimated Creatinine Clearance: 64.4 mL/min (by C-G formula based on SCr of 0.83 mg/dL).  Assessment: 33 YOF w/ PMH significant for PAF and mechanical MVR on Warfarin PTA. Held on admit due to SDH. S/p embolization on 07/26/23.SABRA Cleared to resume warfarin and heparin  bridge on 07/28/23 after repeat head CT.  Targeting low therapeutic heparin  levels.  PTA warfarin regimen: 5 mg daily; last dose 07/22/23 prior to admit.  7/9 AM: INR with minimal increase from 1.7>1.8.  Documented as eating 100% of her meals. Heparin  level therapeutic this AM at 0.47 with heparin  running at 1,250 units/hour. No signs of bleeding or issues with the heparin  infusion noted. CBC stable. Minimal upward movement in INR despite multiple doses higher than PTA regimen. Will provide another augmented dose today.    Goal of Therapy:  Heparin  level 0.3-0.5 units/ml INR 2.5-3.5 Monitor platelets by anticoagulation protocol: Yes   Plan:  Continue heparin  drip at 1250 units/hr Warfarin 10 mg x 1 today Daily heparin  level, PT/INR and CBC. Monitor for signs/symptoms of bleeding.   Massie Fila, PharmD Clinical Pharmacist  08/04/2023  11:35 AM

## 2023-08-04 NOTE — TOC Progression Note (Signed)
 Transition of Care [facility]) - Progression Note    Patient Details  Name: Natasha Davis MRN: [id number] Date of Birth: 16-Dec-1955  [facility] CM/SW Contact  Andrez JULIANNA George, RN Phone Number: 08/04/2023, 10:51 AM  Clinical Narrative:     Continue to await therapeutic INR. Today its 1.8.  TOC following.  Expected Discharge Plan: Home w Home Health Services Barriers to Discharge: Continued Medical Work up  Expected Discharge Plan and Services   Discharge Planning Services: CM Consult   Living arrangements for the past 2 months: Single Family Home                           HH Arranged: PT [facility] Agency: [facility] Date [facility] Agency Contacted: 07/28/23   Representative spoke with at [facility] Agency: Darleene   Social Determinants of Health (SDOH) Interventions SDOH Screenings   Food Insecurity: No Food Insecurity (07/23/2023)  Housing: Low Risk  (07/23/2023)  Transportation Needs: No Transportation Needs (07/23/2023)  Utilities: Not At Risk (07/23/2023)  Social Connections: Moderately Integrated (07/23/2023)  Tobacco Use: Medium Risk (07/26/2023)    Readmission Risk Interventions     No data to display

## 2023-08-04 NOTE — Plan of Care (Signed)
  Problem: Education: Goal: Knowledge of General Education information will improve Description: Including pain rating scale, medication(s)/side effects and non-pharmacologic comfort measures Outcome: Progressing   Problem: Health Behavior/Discharge Planning: Goal: Ability to manage health-related needs will improve Outcome: Progressing   Problem: Clinical Measurements: Goal: Ability to maintain clinical measurements within normal limits will improve Outcome: Progressing   Problem: Safety: Goal: Ability to remain free from injury will improve Outcome: Progressing   Problem: Pain Managment: Goal: General experience of comfort will improve and/or be controlled Outcome: Progressing

## 2023-08-05 ENCOUNTER — Ambulatory Visit

## 2023-08-05 DIAGNOSIS — S065XAA Traumatic subdural hemorrhage with loss of consciousness status unknown, initial encounter: Secondary | ICD-10-CM | POA: Diagnosis not present

## 2023-08-05 LAB — CBC
HCT: 31.5 % — ABNORMAL LOW (ref 36.0–46.0)
Hemoglobin: 10.2 g/dL — ABNORMAL LOW (ref 12.0–15.0)
MCH: 30.7 pg (ref 26.0–34.0)
MCHC: 32.4 g/dL (ref 30.0–36.0)
MCV: 94.9 fL (ref 80.0–100.0)
Platelets: 415 K/uL — ABNORMAL HIGH (ref 150–400)
RBC: 3.32 MIL/uL — ABNORMAL LOW (ref 3.87–5.11)
RDW: 13.2 % (ref 11.5–15.5)
WBC: 13.5 K/uL — ABNORMAL HIGH (ref 4.0–10.5)
nRBC: 0 % (ref 0.0–0.2)

## 2023-08-05 LAB — HEPARIN LEVEL (UNFRACTIONATED): Heparin Unfractionated: 0.53 [IU]/mL (ref 0.30–0.70)

## 2023-08-05 LAB — PROTIME-INR
INR: 1.9 — ABNORMAL HIGH (ref 0.8–1.2)
Prothrombin Time: 22.8 s — ABNORMAL HIGH (ref 11.4–15.2)

## 2023-08-05 MED ORDER — LORATADINE 10 MG PO TABS
10.0000 mg | ORAL_TABLET | Freq: Every day | ORAL | Status: DC
  Administered 2023-08-05 – 2023-08-08 (×4): 10 mg via ORAL
  Filled 2023-08-05 (×4): qty 1

## 2023-08-05 MED ORDER — WARFARIN SODIUM 5 MG PO TABS
10.0000 mg | ORAL_TABLET | Freq: Once | ORAL | Status: AC
  Administered 2023-08-05: 10 mg via ORAL
  Filled 2023-08-05: qty 2

## 2023-08-05 NOTE — Progress Notes (Signed)
 PROGRESS NOTE    Natasha Davis  FMW:[id number] DOB: Jun 09, 1955 DOA: 07/22/2023 PCP: Marvine Rush, MD   Brief Narrative: 68 year old with past medical history significant for seizure disorder, acute CVA (required admission from 07/18/2023 through 07/20/2023 for breakthrough seizure and acute CVA require increased dose of Keppra .  History of mitral valve replacement on Coumadin , GERD, hyperlipidemia, paroxysmal A-fib, heart failure reduced ejection fraction 40 to 45%, morbid obesity presenting with worsening headache.  On presentation CT head show acute bilateral subdural hematoma.  Neurosurgery was consulted and recommended to hold Lovenox  and warfarin and recommended to repeat CT head in 8 to 10 hours.  Cardiology was also consulted.  Subsequently patient had breakthrough seizure.  Keppra  dose was increased.  Neurology was consulted.  LTM EEG was negative for seizure.  LTM EEG discontinued.  Neurology signed off on 07/24/2023.   Assessment & Plan:   Principal Problem:   Subdural hematoma (HCC) Active Problems:   H/O mitral valve replacement with mechanical valve   PAF (paroxysmal atrial fibrillation) (HCC)   Seizure disorder (HCC)   S/P MVR (mitral valve replacement)   Anticoagulation management encounter   Essential hypertension   History of CVA (cerebrovascular accident)   Combined systolic and diastolic congestive heart failure (HCC)   1-Subdural hematoma, intractable headache -Bilateral SDH on CT head.  - Evaluated by neurosurgery and ultimately underwent embolization of the right and left middle meningeal artery on 07/26/2023 in an effort to alleviate time of anticoagulation. - CT head repeated 7/3, discussed with neurosurgery at that time, okay to resume heparin  Coumadin  monitoring. - Outpatient follow-up with neurosurgery planning for repeat imaging as well.  History of mechanical MVR - In the setting of supratherapeutic INR and the patient with a mechanical mitral valve. -  Heparin  was started 7/3 and first dose of Coumadin  7/4 - Continue with bridge - INR goal 2.5-3 INR slowly increasing today is at 1.9  History of recent acute incidental CVA -- MRI of the brain 07/18/2023 noted acute/subacute cortical infarct involving the left parietal lobe, chronic left thalamic infarcts. - Continue statins  Breakthrough seizures History of seizure disorder - Breakthrough seizure in the hospital on 07/23/2023 - Neurology was consulted, Keppra  dose has been increased 750 mg twice daily - LTM EEG was negative for seizures.  He has been discontinued. - Neurology has signed off, outpatient follow-up with neurology   Persistent A-fib Prolonged QT interval Continue metoprolol  Continue anticoagulation as above  Hyperlipidemia - Continue statins  Normocytic anemia - Hemoglobin baseline 11-12 Continue to monitor  Chronic combined systolic and diastolic heart failure Hypertension - Compensated. -GDMT per cardiology  Transaminases:  Unclear etiology, mild follow trend  Hyponatremia - Stable.  Monitor  Leukocytosis:  Reactive, improved.  Morbid Obesity:  Needs lifestyle modification BMI 42      Estimated body mass index is 42.23 kg/m (pended) as calculated from the following:   Height as of this encounter: (P) 4' 11 (1.499 m).   Weight as of this encounter: 94.8 kg.   DVT prophylaxis: Heparin  drip Code Status: Full code Family Communication: Sister who was at bedside Disposition Plan:  Status is: Inpatient Remains inpatient appropriate because: Waiting INR to be therapeutic.  To discharge home    Consultants:  Neurology cardiology  Procedures:    Antimicrobials:    Subjective: Report headaches stable. Is her D[place]a.  She is feeling well.    Objective: Vitals:   08/05/23 0041 08/05/23 0500 08/05/23 0736 08/05/23 1116  BP: (!) 131/54 (!) 123/43 ROLLEN)  163/82 (!) 135/56  Pulse: 80 67 75 73  Resp: 16 17 18 16   Temp: 98.6 F (37 C)  98.6 F (37 C) 98.8 F (37.1 C) 98.5 F (36.9 C)  TempSrc: Oral Oral Oral Oral  SpO2: 100% 97% 98% 97%  Weight:      Height:        Intake/Output Summary (Last 24 hours) at 08/05/2023 1436 Last data filed at 08/05/2023 1123 Gross per 24 hour  Intake 886.81 ml  Output --  Net 886.81 ml   Filed Weights   08/01/23 0500 08/03/23 0500 08/04/23 0500  Weight: 96.1 kg 97.1 kg 94.8 kg    Examination:  General exam: NAD Respiratory system: CTA Cardiovascular system: S 1, S 2 RRR Gastrointestinal system: BS present, soft, nt Central nervous system: Alert, oriented Extremities: no edema   Data Reviewed: I have personally reviewed following labs and imaging studies  CBC: Recent Labs  Lab 07/30/23 0609 07/31/23 0841 08/01/23 0448 08/03/23 0344 08/04/23 0517 08/05/23 0430  WBC 12.8* 14.9* 14.1* 14.4* 12.8* 13.5*  NEUTROABS 9.9* 11.8* 10.8*  --   --   --   HGB 9.6* 10.7* 10.4* 10.5* 10.2* 10.2*  HCT 29.1* 32.3* 32.1* 32.3* 31.4* 31.5*  MCV 93.0 93.9 95.3 93.9 94.0 94.9  PLT 339 360 366 420* 418* 415*   Basic Metabolic Panel: Recent Labs  Lab 07/30/23 0609 07/31/23 0841 08/01/23 0448 08/03/23 0832  NA 129* 128* 130* 131*  K 4.4 4.4 4.8 4.4  CL 94* 92* 92* 91*  CO2 26 23 27 28   GLUCOSE 127* 120* 121* 179*  BUN 14 13 12 13   CREATININE 0.81 0.74 0.76 0.83  CALCIUM  8.8* 9.3 9.1 10.5*  MG 1.9 1.8 2.1  --    GFR: Estimated Creatinine Clearance: 63.5 mL/min (by C-G formula based on SCr of 0.83 mg/dL). Liver Function Tests: Recent Labs  Lab 07/30/23 0609  AST 40  ALT 62*  ALKPHOS 95  BILITOT 0.9  PROT 6.5  ALBUMIN 2.9*   No results for input(s): LIPASE, AMYLASE in the last 168 hours. No results for input(s): AMMONIA in the last 168 hours. Coagulation Profile: Recent Labs  Lab 08/01/23 0448 08/02/23 0452 08/03/23 0344 08/04/23 0517 08/05/23 0430  INR 1.3* 1.3* 1.7* 1.8* 1.9*   Cardiac Enzymes: No results for input(s): CKTOTAL, CKMB,  CKMBINDEX, TROPONINI in the last 168 hours. BNP (last 3 results) No results for input(s): PROBNP in the last 8760 hours. HbA1C: No results for input(s): HGBA1C in the last 72 hours. CBG: No results for input(s): GLUCAP in the last 168 hours. Lipid Profile: No results for input(s): CHOL, HDL, LDLCALC, TRIG, CHOLHDL, LDLDIRECT in the last 72 hours. Thyroid  Function Tests: No results for input(s): TSH, T4TOTAL, FREET4, T3FREE, THYROIDAB in the last 72 hours. Anemia Panel: No results for input(s): VITAMINB12, FOLATE, FERRITIN, TIBC, IRON, RETICCTPCT in the last 72 hours. Sepsis Labs: No results for input(s): PROCALCITON, LATICACIDVEN in the last 168 hours.  No results found for this or any previous visit (from the past 240 hours).        Radiology Studies: No results found.      Scheduled Meds:  atorvastatin   20 mg Oral QHS   carbamide peroxide  5 drop Right EAR BID   furosemide   40 mg Oral Daily   levETIRAcetam   750 mg Oral BID   loratadine   10 mg Oral Daily   losartan   50 mg Oral Daily   melatonin  5 mg Oral QHS  metoprolol  succinate  25 mg Oral Daily   pantoprazole   40 mg Oral Daily   sodium chloride  flush  3 mL Intravenous Q12H   sodium chloride  flush  3 mL Intravenous Q12H   spironolactone   25 mg Oral Daily   warfarin  10 mg Oral ONCE-1600   Warfarin - Pharmacist Dosing Inpatient   Does not apply q1600   Continuous Infusions:  heparin  1,250 Units/hr (08/05/23 1123)     LOS: 14 days    Time spent: 35 minutes.     Owen DELENA Lore, MD [facility]   If 7PM-7AM, please contact night-coverage [email or web address]  08/05/2023, 2:36 PM

## 2023-08-05 NOTE — Plan of Care (Signed)
  Problem: Education: Goal: Knowledge of General Education information will improve Description: Including pain rating scale, medication(s)/side effects and non-pharmacologic comfort measures Outcome: Progressing   Problem: Clinical Measurements: Goal: Ability to maintain clinical measurements within normal limits will improve Outcome: Progressing Goal: Diagnostic test results will improve Outcome: Progressing   Problem: Elimination: Goal: Will not experience complications related to bowel motility Outcome: Progressing

## 2023-08-05 NOTE — Progress Notes (Signed)
 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  Allergies  Allergen Reactions   Penicillins Other (See Comments)    Couldn't walk    Patient Measurements: Height: (P) 4' 11 (149.9 cm) Weight: 94.8 kg (209 lb 1.6 oz) IBW/kg (Calculated) : (P) 43.2 HEPARIN  DW (KG): (P) 66.7  Vital Signs: Temp: 98.5 F (36.9 C) (07/11 1116) Temp Source: Oral (07/11 1116) BP: 135/56 (07/11 1116) Pulse Rate: 73 (07/11 1116)  Labs: Recent Labs    08/03/23 0344 08/03/23 0832 08/04/23 0517 08/05/23 0430  HGB 10.5*  --  10.2* 10.2*  HCT 32.3*  --  31.4* 31.5*  PLT 420*  --  418* 415*  LABPROT 20.7*  --  21.4* 22.8*  INR 1.7*  --  1.8* 1.9*  HEPARINUNFRC 0.47  --  0.47 0.53  CREATININE  --  0.83  --   --     Estimated Creatinine Clearance: 63.5 mL/min (by C-G formula based on SCr of 0.83 mg/dL).  Assessment: 63 YOF w/ PMH significant for PAF and mechanical MVR on Warfarin PTA. Held on admit due to SDH. S/p embolization on 07/26/23.SABRA Cleared to resume warfarin and heparin  bridge on 07/28/23 after repeat head CT.  Targeting low therapeutic heparin  levels.  PTA warfarin regimen: 5 mg daily; last dose 07/22/23 prior to admit.  7/9 AM: INR with minimal increase from 1.8>>1.9. Documented as eating 100% of her meals. Heparin  level therapeutic this AM at 0.53 with heparin  running at 1,250 units/hour- slightly above goal but has been in range for days so will not adjust at this time. No signs of bleeding or issues with the heparin  infusion noted. CBC stable. Minimal upward movement in INR despite multiple doses higher than PTA regimen. Will provide another augmented dose today.   Goal of Therapy:  Heparin  level 0.3-0.5 units/ml INR 2.5-3.5 Monitor platelets by anticoagulation protocol: Yes   Plan:  Continue heparin  drip at 1250 units/hr Warfarin 10 mg x 1 today Daily heparin  level, PT/INR and CBC. Monitor for  signs/symptoms of bleeding.  Massie Fila, PharmD Clinical Pharmacist  08/05/2023 1:14 PM

## 2023-08-05 NOTE — Progress Notes (Signed)
 Physical Therapy Treatment Patient Details Name: Natasha Davis MRN: [id number] DOB: [birth year] Today's Date: 08/05/2023   History of Present Illness Pt is a 68 y.o. F who presents 07/22/2023 with worsening headache. On presentation, CT head showed acute bilateral subdural hematoma with some shift. S/p bilateral MMA embolization 7/1. PMH includes: CVA (admission 6/23-6/25/2025 for breakthrough seizure and acute CVA), HTN, seizure.    PT Comments  Pt received in the bathroom and agreeable to session. Pt able to ambulate without AD this session with no LOB and slightly improved drifting. Pt used SPC at end of gait trial and reports preference for UE support for improved stability. Equipment recommendations updated after discussion with supervising PT Aleck, B. Pt continues to benefit from PT services to progress toward functional mobility goals.    If plan is discharge home, recommend the following: A little help with walking and/or transfers;A little help with bathing/dressing/bathroom;Assistance with cooking/housework;Help with stairs or ramp for entrance;Assist for transportation;Direct supervision/assist for financial management;Direct supervision/assist for medications management   Can travel by private vehicle        Equipment Recommendations  Cane    Recommendations for Other Services       Precautions / Restrictions Precautions Precautions: Fall Recall of Precautions/Restrictions: Intact Restrictions Weight Bearing Restrictions Per Provider Order: No     Mobility  Bed Mobility Overal bed mobility: Needs Assistance Bed Mobility: Sit to Supine       Sit to supine: Supervision, Used rails, HOB elevated   General bed mobility comments: reliance on bed features and increased time    Transfers Overall transfer level: Needs assistance Equipment used: None Transfers: Sit to/from Stand Sit to Stand: Supervision           General transfer comment: from low toilet  with supervision for safety    Ambulation/Gait Ambulation/Gait assistance: Contact guard assist Gait Distance (Feet): 200 Feet Assistive device: None, Straight cane Gait Pattern/deviations: Step-through pattern, Decreased stride length, Drifts right/left, Wide base of support Gait velocity: decreased     General Gait Details: Most of trial without AD and last 77ft with SPC. Mild drifting and pt tending to reach out for UE support without AD.   Stairs             Wheelchair Mobility     Tilt Bed    Modified Rankin (Stroke Patients Only) Modified Rankin (Stroke Patients Only) Pre-Morbid Rankin Score: No significant disability Modified Rankin: Moderately severe disability     Balance Overall balance assessment: Needs assistance Sitting-balance support: Feet supported Sitting balance-Leahy Scale: Good     Standing balance support: During functional activity, Single extremity supported, No upper extremity supported Standing balance-Leahy Scale: Fair Standing balance comment: benefits from UE support, but able to ambulate without AD with no LOB                            Communication Communication Communication: No apparent difficulties  Cognition Arousal: Alert Behavior During Therapy: WFL for tasks assessed/performed   PT - Cognitive impairments: Memory, Awareness, Attention, Problem solving                         Following commands: Impaired Following commands impaired: Only follows one step commands consistently, Follows multi-step commands with increased time    Cueing Cueing Techniques: Verbal cues, Gestural cues, Tactile cues, Visual cues  Exercises      General Comments  Pertinent Vitals/Pain Pain Assessment Pain Assessment: No/denies pain     PT Goals (current goals can now be found in the care plan section) Acute Rehab PT Goals Patient Stated Goal: to walk PT Goal Formulation: With patient Time For Goal  Achievement: 08/10/23 Progress towards PT goals: Progressing toward goals    Frequency    Min 2X/week       AM-PAC PT 6 Clicks Mobility   Outcome Measure  Help needed turning from your back to your side while in a flat bed without using bedrails?: A Little Help needed moving from lying on your back to sitting on the side of a flat bed without using bedrails?: A Little Help needed moving to and from a bed to a chair (including a wheelchair)?: A Little Help needed standing up from a chair using your arms (e.g., wheelchair or bedside chair)?: A Little Help needed to walk in hospital room?: A Little Help needed climbing 3-5 steps with a railing? : A Little 6 Click Score: 18    End of Session Equipment Utilized During Treatment: Gait belt Activity Tolerance: Patient tolerated treatment well Patient left: in bed;with call bell/phone within reach;with family/visitor present Nurse Communication: Mobility status PT Visit Diagnosis: Unsteadiness on feet (R26.81)     Time: 1000-1016 PT Time Calculation (min) (ACUTE ONLY): 16 min  Charges:    $Gait Training: 8-22 mins PT General Charges $$ ACUTE PT VISIT: 1 Visit                     Darryle George, PTA Acute Rehabilitation Services Secure Chat Preferred  [phone]    Darryle George 08/05/2023, 12:29 PM

## 2023-08-06 DIAGNOSIS — S065XAA Traumatic subdural hemorrhage with loss of consciousness status unknown, initial encounter: Secondary | ICD-10-CM | POA: Diagnosis not present

## 2023-08-06 LAB — CBC
HCT: 31.5 % — ABNORMAL LOW (ref 36.0–46.0)
Hemoglobin: 10.3 g/dL — ABNORMAL LOW (ref 12.0–15.0)
MCH: 30.8 pg (ref 26.0–34.0)
MCHC: 32.7 g/dL (ref 30.0–36.0)
MCV: 94.3 fL (ref 80.0–100.0)
Platelets: 419 K/uL — ABNORMAL HIGH (ref 150–400)
RBC: 3.34 MIL/uL — ABNORMAL LOW (ref 3.87–5.11)
RDW: 13.2 % (ref 11.5–15.5)
WBC: 11.3 K/uL — ABNORMAL HIGH (ref 4.0–10.5)
nRBC: 0.2 % (ref 0.0–0.2)

## 2023-08-06 LAB — HEPARIN LEVEL (UNFRACTIONATED)
Heparin Unfractionated: 0.6 [IU]/mL (ref 0.30–0.70)
Heparin Unfractionated: 0.52 [IU]/mL (ref 0.30–0.70)

## 2023-08-06 LAB — PROTIME-INR
INR: 2.2 — ABNORMAL HIGH (ref 0.8–1.2)
Prothrombin Time: 25.1 s — ABNORMAL HIGH (ref 11.4–15.2)

## 2023-08-06 MED ORDER — WARFARIN SODIUM 5 MG PO TABS
10.0000 mg | ORAL_TABLET | Freq: Once | ORAL | Status: AC
  Administered 2023-08-06: 10 mg via ORAL
  Filled 2023-08-06: qty 2

## 2023-08-06 NOTE — Progress Notes (Addendum)
 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  Allergies  Allergen Reactions   Penicillins Other (See Comments)    Couldn't walk    Patient Measurements: Height: (P) 4' 11 (149.9 cm) Weight: 94.8 kg (209 lb 1.6 oz) IBW/kg (Calculated) : (P) 43.2 HEPARIN  DW (KG): (P) 66.7  Vital Signs: Temp: 98.5 F (36.9 C) (07/12 0712) Temp Source: Oral (07/12 0413) BP: 140/67 (07/12 0712) Pulse Rate: 60 (07/12 0712)  Labs: Recent Labs    08/04/23 0517 08/05/23 0430 08/06/23 0423  HGB 10.2* 10.2* 10.3*  HCT 31.4* 31.5* 31.5*  PLT 418* 415* 419*  LABPROT 21.4* 22.8* 25.1*  INR 1.8* 1.9* 2.2*  HEPARINUNFRC 0.47 0.53 0.60    Estimated Creatinine Clearance: 63.5 mL/min (by C-G formula based on SCr of 0.83 mg/dL).  Assessment: 12 YOF w/ PMH significant for PAF and mechanical MVR on Warfarin PTA. Held on admit due to SDH. S/p embolization on 07/26/23. Cleared to resume warfarin and heparin  bridge on 07/28/23 after repeat head CT.  Targeting low therapeutic heparin  levels.  PTA warfarin regimen: 5 mg daily; last dose 07/22/23 prior to admit.  7/11 AM: INR with minimal increase from 1.8>>1.9. Documented as eating 100% of her meals. Heparin  level therapeutic this AM at 0.53 with heparin  running at 1,250 units/hour- slightly above goal but has been in range for days so will not adjust at this time. No signs of bleeding or issues with the heparin  infusion noted. CBC stable. Minimal upward movement in INR despite multiple doses higher than PTA regimen. Will provide another augmented dose today.   7/12: INR sub-therapeutic at 2.2. Heparin  lvl supra-therapeutic at 0.6 with heparin  running at 1250 units/hr. No signs of bleeding. Hgb stable, Plts stable.  Goal of Therapy:  Heparin  level 0.3-0.5 units/ml INR 2.5-3.5 Monitor platelets by anticoagulation protocol: Yes   Plan:  Decrease heparin  to 1150  units/hr Warfarin 10 mg x 1 today Recheck heparin  lvl at 1830 Daily heparin  level, PT/INR and CBC. Monitor for signs/symptoms of bleeding.  Loren Vicens, PharmD PGY-1 Pharmacy Resident [facility]  08/06/2023 9:43 AM

## 2023-08-06 NOTE — Progress Notes (Signed)
 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  Allergies  Allergen Reactions   Penicillins Other (See Comments)    Couldn't walk    Patient Measurements: Height: (P) 4' 11 (149.9 cm) Weight: 94.8 kg (209 lb 1.6 oz) IBW/kg (Calculated) : (P) 43.2 HEPARIN  DW (KG): (P) 66.7  Vital Signs: Temp: 99.4 F (37.4 C) (07/12 1941) Temp Source: Oral (07/12 1941) BP: 130/67 (07/12 1941) Pulse Rate: 76 (07/12 1941)  Labs: Recent Labs    08/04/23 0517 08/05/23 0430 08/06/23 0423 08/06/23 1858  HGB 10.2* 10.2* 10.3*  --   HCT 31.4* 31.5* 31.5*  --   PLT 418* 415* 419*  --   LABPROT 21.4* 22.8* 25.1*  --   INR 1.8* 1.9* 2.2*  --   HEPARINUNFRC 0.47 0.53 0.60 0.52    Estimated Creatinine Clearance: 63.5 mL/min (by C-G formula based on SCr of 0.83 mg/dL).  Assessment: 20 YOF w/ PMH significant for PAF and mechanical MVR on Warfarin PTA. Held on admit due to SDH. S/p embolization on 07/26/23. Cleared to resume warfarin and heparin  bridge on 07/28/23 after repeat head CT.  Targeting low therapeutic heparin  levels.  PTA warfarin regimen: 5 mg daily; last dose 07/22/23 prior to admit.  7/11 AM: INR with minimal increase from 1.8>>1.9. Documented as eating 100% of her meals. Heparin  level therapeutic this AM at 0.53 with heparin  running at 1,250 units/hour- slightly above goal but has been in range for days so will not adjust at this time. No signs of bleeding or issues with the heparin  infusion noted. CBC stable. Minimal upward movement in INR despite multiple doses higher than PTA regimen. Will provide another augmented dose today.   7/12: INR sub-therapeutic at 2.2. Heparin  lvl supra-therapeutic at 0.6 with heparin  running at 1250 units/hr. No signs of bleeding. Hgb stable, Plts stable.  7/12 PM: Heparin  level supra-therapeutic at 0.52 with heparin  running at 1150 units/hr. No signs of bleeding. Hgb  stable, Plts stable.  Goal of Therapy:  Heparin  level 0.3-0.5 units/ml INR 2.5-3.5 Monitor platelets by anticoagulation protocol: Yes   Plan:  Decrease heparin  to 1050 units/hr Daily heparin  level, PT/INR and CBC. Monitor for signs/symptoms of bleeding.  R. Samual Satterfield, PharmD PGY-1 Acute Care Pharmacy Resident [facility] 08/06/2023 8:08 PM

## 2023-08-06 NOTE — Plan of Care (Signed)
  Problem: Education: Goal: Knowledge of General Education information will improve Description: Including pain rating scale, medication(s)/side effects and non-pharmacologic comfort measures Outcome: Progressing   Problem: Health Behavior/Discharge Planning: Goal: Ability to manage health-related needs will improve Outcome: Progressing   Problem: Clinical Measurements: Goal: Ability to maintain clinical measurements within normal limits will improve Outcome: Progressing Goal: Will remain free from infection Outcome: Progressing Goal: Diagnostic test results will improve Outcome: Progressing Goal: Respiratory complications will improve Outcome: Progressing Goal: Cardiovascular complication will be avoided Outcome: Progressing   Problem: Elimination: Goal: Will not experience complications related to bowel motility Outcome: Progressing Goal: Will not experience complications related to urinary retention Outcome: Progressing   Problem: Pain Managment: Goal: General experience of comfort will improve and/or be controlled Outcome: Progressing   Problem: Safety: Goal: Ability to remain free from injury will improve Outcome: Progressing   Problem: Skin Integrity: Goal: Risk for impaired skin integrity will decrease Outcome: Progressing   Problem: Education: Goal: Understanding of CV disease, CV risk reduction, and recovery process will improve Outcome: Progressing Goal: Individualized Educational Video(s) Outcome: Progressing   Problem: Activity: Goal: Ability to return to baseline activity level will improve Outcome: Progressing   Problem: Cardiovascular: Goal: Ability to achieve and maintain adequate cardiovascular perfusion will improve Outcome: Progressing Goal: Vascular access site(s) Level 0-1 will be maintained Outcome: Progressing   Problem: Health Behavior/Discharge Planning: Goal: Ability to safely manage health-related needs after discharge will  improve Outcome: Progressing

## 2023-08-06 NOTE — Progress Notes (Deleted)
 F/U Due 0500 HL (Goal 0.3-0.5)   Summary Active Problem(s): HA > SDH  PMH: seizure dx, CVA, hx MVR on warfarin, HLD, afib, HFrEF 40-45%, BMI 42  Significant events:  acute CVA (required admission from 07/18/2023-07/20/2023 for breakthrough seizures) 7/1 MMA embolization    Rx Dialog Action items:  Heparin  drip at 1150 units/hr, 8 hr lvl INR 2.2 (goal 2.5-3.5)-- warfarin 10 mg x1  Return to PTA regimen- anticipate continued upward trend (goal 2.5-3.5)) Stop heparin  when INR at 2.5-3.5 goal, or change to Lovenox  when safe per MD - NS says can dc from their standpoint once INR is therapeutic  PTA Meds: enox,  K 20 bid , triam-hctz Rx at DC: [employer] TOC / Dispo:   AC/Heme: Heparin  and PTA warfarin for mMVR (goal 2.5-3.5) and afib- HELD >> Heparin  begun 7/3, warf resumed 7/4 (missed 7/3) - Heparin  drip begun 7/3 ~6pm without bolus d/t SDH; warfarin resumed 7/4  Last anticoag visit 6/27 - 5 mg daily (INR 2.8) > INR 2.1 on admit 6/27 ID: [id number] MRSA PCR: neg Neuro/Pain: subdural hematoma and sz w/ admit for acute CVA and breakthrough sz 07/18/23 7/3 CT: Left side SDH has dec'd, Right side SDH remains large, no more MLS - keppra  750 bid, melatonin 5, prn loraz   CV: hx HTN, HF  BP variable but most recent low nl, HR 80 - atorv 20, furos 40 PO daily, losartan  50, toprol  25, spiro 25 Pulm: RA Endo: A1C 5.7, glucoses 90s-130s GI/Nutrition: protonix  daily Hepatic:  Renal: scr <1; lytes wnl [Na 130]

## 2023-08-06 NOTE — Progress Notes (Signed)
 PROGRESS NOTE    Natasha Davis  FMW:[id number] DOB: 12/30/1955 DOA: 07/22/2023 PCP: Marvine Rush, MD   Brief Narrative: 68 year old with past medical history significant for seizure disorder, acute CVA (required admission from 07/18/2023 through 07/20/2023 for breakthrough seizure and acute CVA require increased dose of Keppra .  History of mitral valve replacement on Coumadin , GERD, hyperlipidemia, paroxysmal A-fib, heart failure reduced ejection fraction 40 to 45%, morbid obesity presenting with worsening headache.  On presentation CT head show acute bilateral subdural hematoma.  Neurosurgery was consulted and recommended to hold Lovenox  and warfarin and recommended to repeat CT head in 8 to 10 hours.  Cardiology was also consulted.  Subsequently patient had breakthrough seizure.  Keppra  dose was increased.  Neurology was consulted.  LTM EEG was negative for seizure.  LTM EEG discontinued.  Neurology signed off on 07/24/2023.   Assessment & Plan:   Principal Problem:   Subdural hematoma (HCC) Active Problems:   H/O mitral valve replacement with mechanical valve   PAF (paroxysmal atrial fibrillation) (HCC)   Seizure disorder (HCC)   S/P MVR (mitral valve replacement)   Anticoagulation management encounter   Essential hypertension   History of CVA (cerebrovascular accident)   Combined systolic and diastolic congestive heart failure (HCC)   1-Subdural hematoma, intractable headache -Bilateral SDH on CT head.  - Evaluated by neurosurgery and ultimately underwent embolization of the right and left middle meningeal artery on 07/26/2023 in an effort to alleviate time of anticoagulation. - CT head repeated 7/3, discussed with neurosurgery at that time, okay to resume heparin  Coumadin  monitoring. - Outpatient follow-up with neurosurgery planning for repeat imaging as well.  History of mechanical MVR - In the setting of supratherapeutic INR and the patient with a mechanical mitral valve. -  Heparin  was started 7/3 and first dose of Coumadin  7/4 - Continue with bridge - INR goal 2.5---3 INR slowly increasing today is at 2.2  History of recent acute incidental CVA -- MRI of the brain 07/18/2023 noted acute/subacute cortical infarct involving the left parietal lobe, chronic left thalamic infarcts. - Continue statins  Breakthrough seizures History of seizure disorder - Breakthrough seizure in the hospital on 07/23/2023 - Neurology was consulted, Keppra  dose has been increased 750 mg twice daily - LTM EEG was negative for seizures.  He has been discontinued. - Neurology has signed off, outpatient follow-up with neurology   Persistent A-fib Prolonged QT interval Continue metoprolol  Continue anticoagulation as above  Hyperlipidemia - Continue statins  Normocytic anemia - Hemoglobin baseline 11-12 Continue to monitor  Chronic combined systolic and diastolic heart failure Hypertension - Compensated. -GDMT per cardiology  Transaminases:  Unclear etiology, mild follow trend  Hyponatremia - Stable.  Monitor  Leukocytosis:  Reactive, improved.  Morbid Obesity:  Needs lifestyle modification BMI 42      Estimated body mass index is 42.23 kg/m (pended) as calculated from the following:   Height as of this encounter: (P) 4' 11 (1.499 m).   Weight as of this encounter: 94.8 kg.   DVT prophylaxis: Heparin  drip Code Status: Full code Family Communication: Son at bedside 7/11 Disposition Plan:  Status is: Inpatient Remains inpatient appropriate because: Waiting INR to be therapeutic.  To discharge home    Consultants:  Neurology cardiology  Procedures:    Antimicrobials:    Subjective: She is feeling ok, denies worsening headaches.    Objective: Vitals:   08/05/23 2003 08/05/23 2352 08/06/23 0413 08/06/23 0712  BP: 130/72 133/63 (!) 120/48 (!) 140/67  Pulse: 69  71 84 60  Resp: 18 18 18 18   Temp: 98.9 F (37.2 C) 97.6 F (36.4 C) 99.5 F  (37.5 C) 98.5 F (36.9 C)  TempSrc: Oral Oral Oral   SpO2: 94% 94% 100% 98%  Weight:      Height:       No intake or output data in the 24 hours ending 08/06/23 1157  Filed Weights   08/01/23 0500 08/03/23 0500 08/04/23 0500  Weight: 96.1 kg 97.1 kg 94.8 kg    Examination:  General exam: NAD Respiratory system: CTA Cardiovascular system: S 1, S 2 RRR Gastrointestinal system: BS present, soft, nt Central nervous system: Alert, oriented.  Extremities: no edema   Data Reviewed: I have personally reviewed following labs and imaging studies  CBC: Recent Labs  Lab 07/31/23 0841 08/01/23 0448 08/03/23 0344 08/04/23 0517 08/05/23 0430 08/06/23 0423  WBC 14.9* 14.1* 14.4* 12.8* 13.5* 11.3*  NEUTROABS 11.8* 10.8*  --   --   --   --   HGB 10.7* 10.4* 10.5* 10.2* 10.2* 10.3*  HCT 32.3* 32.1* 32.3* 31.4* 31.5* 31.5*  MCV 93.9 95.3 93.9 94.0 94.9 94.3  PLT 360 366 420* 418* 415* 419*   Basic Metabolic Panel: Recent Labs  Lab 07/31/23 0841 08/01/23 0448 08/03/23 0832  NA 128* 130* 131*  K 4.4 4.8 4.4  CL 92* 92* 91*  CO2 23 27 28   GLUCOSE 120* 121* 179*  BUN 13 12 13   CREATININE 0.74 0.76 0.83  CALCIUM  9.3 9.1 10.5*  MG 1.8 2.1  --    GFR: Estimated Creatinine Clearance: 63.5 mL/min (by C-G formula based on SCr of 0.83 mg/dL). Liver Function Tests: No results for input(s): AST, ALT, ALKPHOS, BILITOT, PROT, ALBUMIN in the last 168 hours.  No results for input(s): LIPASE, AMYLASE in the last 168 hours. No results for input(s): AMMONIA in the last 168 hours. Coagulation Profile: Recent Labs  Lab 08/02/23 0452 08/03/23 0344 08/04/23 0517 08/05/23 0430 08/06/23 0423  INR 1.3* 1.7* 1.8* 1.9* 2.2*   Cardiac Enzymes: No results for input(s): CKTOTAL, CKMB, CKMBINDEX, TROPONINI in the last 168 hours. BNP (last 3 results) No results for input(s): PROBNP in the last 8760 hours. HbA1C: No results for input(s): HGBA1C in the last 72  hours. CBG: No results for input(s): GLUCAP in the last 168 hours. Lipid Profile: No results for input(s): CHOL, HDL, LDLCALC, TRIG, CHOLHDL, LDLDIRECT in the last 72 hours. Thyroid  Function Tests: No results for input(s): TSH, T4TOTAL, FREET4, T3FREE, THYROIDAB in the last 72 hours. Anemia Panel: No results for input(s): VITAMINB12, FOLATE, FERRITIN, TIBC, IRON, RETICCTPCT in the last 72 hours. Sepsis Labs: No results for input(s): PROCALCITON, LATICACIDVEN in the last 168 hours.  No results found for this or any previous visit (from the past 240 hours).        Radiology Studies: No results found.      Scheduled Meds:  atorvastatin   20 mg Oral QHS   furosemide   40 mg Oral Daily   levETIRAcetam   750 mg Oral BID   loratadine   10 mg Oral Daily   losartan   50 mg Oral Daily   melatonin  5 mg Oral QHS   metoprolol  succinate  25 mg Oral Daily   pantoprazole   40 mg Oral Daily   sodium chloride  flush  3 mL Intravenous Q12H   sodium chloride  flush  3 mL Intravenous Q12H   spironolactone   25 mg Oral Daily   warfarin  10 mg Oral ONCE-1600  Warfarin - Pharmacist Dosing Inpatient   Does not apply q1600   Continuous Infusions:  heparin  1,150 Units/hr (08/06/23 1035)     LOS: 15 days    Time spent: 35 minutes.     Owen DELENA Lore, MD [facility]   If 7PM-7AM, please contact night-coverage [email or web address]  08/06/2023, 11:57 AM

## 2023-08-07 DIAGNOSIS — S065XAA Traumatic subdural hemorrhage with loss of consciousness status unknown, initial encounter: Secondary | ICD-10-CM | POA: Diagnosis not present

## 2023-08-07 LAB — PROTIME-INR
INR: 2.4 — ABNORMAL HIGH (ref 0.8–1.2)
Prothrombin Time: 27.7 s — ABNORMAL HIGH (ref 11.4–15.2)

## 2023-08-07 LAB — CBC
HCT: 32 % — ABNORMAL LOW (ref 36.0–46.0)
Hemoglobin: 10.3 g/dL — ABNORMAL LOW (ref 12.0–15.0)
MCH: 30.3 pg (ref 26.0–34.0)
MCHC: 32.2 g/dL (ref 30.0–36.0)
MCV: 94.1 fL (ref 80.0–100.0)
Platelets: 351 K/uL (ref 150–400)
RBC: 3.4 MIL/uL — ABNORMAL LOW (ref 3.87–5.11)
RDW: 13.3 % (ref 11.5–15.5)
WBC: 10.1 K/uL (ref 4.0–10.5)
nRBC: 0 % (ref 0.0–0.2)

## 2023-08-07 LAB — HEPARIN LEVEL (UNFRACTIONATED): Heparin Unfractionated: 0.4 [IU]/mL (ref 0.30–0.70)

## 2023-08-07 MED ORDER — WARFARIN SODIUM 5 MG PO TABS
10.0000 mg | ORAL_TABLET | Freq: Once | ORAL | Status: AC
  Administered 2023-08-07: 10 mg via ORAL
  Filled 2023-08-07: qty 2

## 2023-08-07 MED ORDER — WARFARIN SODIUM 7.5 MG PO TABS: 7.5000 mg | ORAL_TABLET | Freq: Once | ORAL | Status: DC

## 2023-08-07 NOTE — Progress Notes (Signed)
 PROGRESS NOTE    Natasha Davis  FMW:[id number] DOB: 1955/09/12 DOA: 07/22/2023 PCP: Marvine Rush, MD   Brief Narrative: 68 year old with past medical history significant for seizure disorder, acute CVA (required admission from 07/18/2023 through 07/20/2023 for breakthrough seizure and acute CVA require increased dose of Keppra .  History of mitral valve replacement on Coumadin , GERD, hyperlipidemia, paroxysmal A-fib, heart failure reduced ejection fraction 40 to 45%, morbid obesity presenting with worsening headache.  On presentation CT head show acute bilateral subdural hematoma.  Neurosurgery was consulted and recommended to hold Lovenox  and warfarin and recommended to repeat CT head in 8 to 10 hours.  Cardiology was also consulted.  Subsequently patient had breakthrough seizure.  Keppra  dose was increased.  Neurology was consulted.  LTM EEG was negative for seizure.  LTM EEG discontinued.  Neurology signed off on 07/24/2023.  Awaiting INR to be at-least 2.5 to discharge home.   Assessment & Plan:   Principal Problem:   Subdural hematoma (HCC) Active Problems:   H/O mitral valve replacement with mechanical valve   PAF (paroxysmal atrial fibrillation) (HCC)   Seizure disorder (HCC)   S/P MVR (mitral valve replacement)   Anticoagulation management encounter   Essential hypertension   History of CVA (cerebrovascular accident)   Combined systolic and diastolic congestive heart failure (HCC)   1-Subdural hematoma, intractable headache -Bilateral SDH on CT head.  - Evaluated by neurosurgery and ultimately underwent embolization of the right and left middle meningeal artery on 07/26/2023 in an effort to alleviate time of anticoagulation. - CT head repeated 7/3, discussed with neurosurgery at that time, okay to resume heparin  Coumadin  monitoring. - Outpatient follow-up with neurosurgery planning for repeat imaging as well.  History of mechanical MVR - In the setting of supratherapeutic INR  and the patient with a mechanical mitral valve. - Heparin  was started 7/3 and first dose of Coumadin  7/4 - Continue with bridge - INR goal 2.5---3 INR slowly increasing today is at 2.4 Home tomorrow if INR at 2.5  History of recent acute incidental CVA -- MRI of the brain 07/18/2023 noted acute/subacute cortical infarct involving the left parietal lobe, chronic left thalamic infarcts. - Continue statins  Breakthrough seizures History of seizure disorder - Breakthrough seizure in the hospital on 07/23/2023 - Neurology was consulted, Keppra  dose has been increased 750 mg twice daily - LTM EEG was negative for seizures.  He has been discontinued. - Neurology has signed off, outpatient follow-up with neurology   Persistent A-fib Prolonged QT interval Continue metoprolol  Continue anticoagulation as above  Hyperlipidemia - Continue statins  Normocytic anemia - Hemoglobin baseline 11-12 Continue to monitor  Chronic combined systolic and diastolic heart failure Hypertension - Compensated. -GDMT per cardiology  Transaminases:  Unclear etiology, mild follow trend  Hyponatremia - Stable.  Monitor  Leukocytosis:  Reactive, improved.  Morbid Obesity:  Needs lifestyle modification BMI 42      Estimated body mass index is 42.23 kg/m (pended) as calculated from the following:   Height as of this encounter: (P) 4' 11 (1.499 m).   Weight as of this encounter: 94.8 kg.   DVT prophylaxis: Heparin  drip Code Status: Full code Family Communication: Son at bedside 7/11 Disposition Plan:  Status is: Inpatient Remains inpatient appropriate because: Waiting INR to be therapeutic.  To discharge home    Consultants:  Neurology cardiology  Procedures:    Antimicrobials:    Subjective: No new complaints, denies worsening headaches.    Objective: Vitals:   08/07/23 9663 08/07/23 9261  08/07/23 1210 08/07/23 1553  BP: 121/60 130/77 (!) 129/51 (!) 139/108  Pulse: 72  76 84 71  Resp: 16 18 17 19   Temp: 97.7 F (36.5 C) 98.3 F (36.8 C) 98.5 F (36.9 C) 98 F (36.7 C)  TempSrc:  Oral Oral Oral  SpO2: 99% 95% 98% 98%  Weight:      Height:        Intake/Output Summary (Last 24 hours) at 08/07/2023 1604 Last data filed at 08/06/2023 2105 Gross per 24 hour  Intake 240 ml  Output --  Net 240 ml    Filed Weights   08/01/23 0500 08/03/23 0500 08/04/23 0500  Weight: 96.1 kg 97.1 kg 94.8 kg    Examination:  General exam: NAD Respiratory system: CTA Cardiovascular system: S 1, S 2 RRR Gastrointestinal system: BS present, soft, nt Central nervous system: alert Extremities: no edema   Data Reviewed: I have personally reviewed following labs and imaging studies  CBC: Recent Labs  Lab 08/01/23 0448 08/03/23 0344 08/04/23 0517 08/05/23 0430 08/06/23 0423 08/07/23 0628  WBC 14.1* 14.4* 12.8* 13.5* 11.3* 10.1  NEUTROABS 10.8*  --   --   --   --   --   HGB 10.4* 10.5* 10.2* 10.2* 10.3* 10.3*  HCT 32.1* 32.3* 31.4* 31.5* 31.5* 32.0*  MCV 95.3 93.9 94.0 94.9 94.3 94.1  PLT 366 420* 418* 415* 419* 351   Basic Metabolic Panel: Recent Labs  Lab 08/01/23 0448 08/03/23 0832  NA 130* 131*  K 4.8 4.4  CL 92* 91*  CO2 27 28  GLUCOSE 121* 179*  BUN 12 13  CREATININE 0.76 0.83  CALCIUM  9.1 10.5*  MG 2.1  --    GFR: Estimated Creatinine Clearance: 63.5 mL/min (by C-G formula based on SCr of 0.83 mg/dL). Liver Function Tests: No results for input(s): AST, ALT, ALKPHOS, BILITOT, PROT, ALBUMIN in the last 168 hours.  No results for input(s): LIPASE, AMYLASE in the last 168 hours. No results for input(s): AMMONIA in the last 168 hours. Coagulation Profile: Recent Labs  Lab 08/03/23 0344 08/04/23 0517 08/05/23 0430 08/06/23 0423 08/07/23 0628  INR 1.7* 1.8* 1.9* 2.2* 2.4*   Cardiac Enzymes: No results for input(s): CKTOTAL, CKMB, CKMBINDEX, TROPONINI in the last 168 hours. BNP (last 3 results) No results  for input(s): PROBNP in the last 8760 hours. HbA1C: No results for input(s): HGBA1C in the last 72 hours. CBG: No results for input(s): GLUCAP in the last 168 hours. Lipid Profile: No results for input(s): CHOL, HDL, LDLCALC, TRIG, CHOLHDL, LDLDIRECT in the last 72 hours. Thyroid  Function Tests: No results for input(s): TSH, T4TOTAL, FREET4, T3FREE, THYROIDAB in the last 72 hours. Anemia Panel: No results for input(s): VITAMINB12, FOLATE, FERRITIN, TIBC, IRON, RETICCTPCT in the last 72 hours. Sepsis Labs: No results for input(s): PROCALCITON, LATICACIDVEN in the last 168 hours.  No results found for this or any previous visit (from the past 240 hours).        Radiology Studies: No results found.      Scheduled Meds:  atorvastatin   20 mg Oral QHS   furosemide   40 mg Oral Daily   levETIRAcetam   750 mg Oral BID   loratadine   10 mg Oral Daily   losartan   50 mg Oral Daily   melatonin  5 mg Oral QHS   metoprolol  succinate  25 mg Oral Daily   pantoprazole   40 mg Oral Daily   sodium chloride  flush  3 mL Intravenous Q12H   sodium  chloride flush  3 mL Intravenous Q12H   spironolactone   25 mg Oral Daily   warfarin  10 mg Oral ONCE-1600   Warfarin - Pharmacist Dosing Inpatient   Does not apply q1600   Continuous Infusions:  heparin  1,050 Units/hr (08/07/23 0102)     LOS: 16 days    Time spent: 35 minutes.     Owen DELENA Lore, MD [facility]   If 7PM-7AM, please contact night-coverage [email or web address]  08/07/2023, 4:04 PM

## 2023-08-07 NOTE — Progress Notes (Addendum)
 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  Allergies  Allergen Reactions   Penicillins Other (See Comments)    Couldn't walk    Patient Measurements: Height: (P) 4' 11 (149.9 cm) Weight: 94.8 kg (209 lb 1.6 oz) IBW/kg (Calculated) : (P) 43.2 HEPARIN  DW (KG): (P) 66.7  Vital Signs: Temp: 98.3 F (36.8 C) (07/13 0738) Temp Source: Oral (07/13 0738) BP: 130/77 (07/13 0738) Pulse Rate: 76 (07/13 0738)  Labs: Recent Labs    08/05/23 0430 08/06/23 0423 08/06/23 1858 08/07/23 0628  HGB 10.2* 10.3*  --  10.3*  HCT 31.5* 31.5*  --  32.0*  PLT 415* 419*  --  351  LABPROT 22.8* 25.1*  --  27.7*  INR 1.9* 2.2*  --  2.4*  HEPARINUNFRC 0.53 0.60 0.52 0.40    Estimated Creatinine Clearance: 63.5 mL/min (by C-G formula based on SCr of 0.83 mg/dL).  Assessment: 3 YOF w/ PMH significant for PAF and mechanical MVR on Warfarin PTA. Held on admit due to SDH. S/p embolization on 07/26/23. Cleared to resume warfarin and heparin  bridge on 07/28/23 after repeat head CT.  Targeting low therapeutic heparin  levels.  PTA warfarin regimen: 5 mg daily; last dose 07/22/23 prior to admit.  7/12 PM: Heparin  level supra-therapeutic at 0.52 with heparin  running at 1150 units/hr. No signs of bleeding. Hgb stable, Plts stable.  7/13 AM: Heparin  level therapeutic at 0.4 with heparin  running at 1050 units/hr. No signs of bleeding. Hgb stable, plts 351. INR 2.4, sub-therapeutic. Will give a lower dose today to prevent overshooting INR goal.  Goal of Therapy:  Heparin  level 0.3-0.5 units/ml INR 2.5-3.5 Monitor platelets by anticoagulation protocol: Yes   Plan:  Continue heparin  to 1050 units/hr Warfarin 10 mg x1 Daily heparin  level, PT/INR and CBC. Monitor for signs/symptoms of bleeding.  Brandan Robicheaux, PharmD PGY-1 Pharmacy Resident [facility]  08/07/2023 8:14 AM

## 2023-08-08 ENCOUNTER — Other Ambulatory Visit (HOSPITAL_COMMUNITY): Payer: Self-pay

## 2023-08-08 DIAGNOSIS — S065XAA Traumatic subdural hemorrhage with loss of consciousness status unknown, initial encounter: Secondary | ICD-10-CM | POA: Diagnosis not present

## 2023-08-08 LAB — CBC
HCT: 32.6 % — ABNORMAL LOW (ref 36.0–46.0)
Hemoglobin: 10.6 g/dL — ABNORMAL LOW (ref 12.0–15.0)
MCH: 30.8 pg (ref 26.0–34.0)
MCHC: 32.5 g/dL (ref 30.0–36.0)
MCV: 94.8 fL (ref 80.0–100.0)
Platelets: 365 K/uL (ref 150–400)
RBC: 3.44 MIL/uL — ABNORMAL LOW (ref 3.87–5.11)
RDW: 13.3 % (ref 11.5–15.5)
WBC: 10.6 K/uL — ABNORMAL HIGH (ref 4.0–10.5)
nRBC: 0 % (ref 0.0–0.2)

## 2023-08-08 LAB — HEPARIN LEVEL (UNFRACTIONATED): Heparin Unfractionated: 0.37 [IU]/mL (ref 0.30–0.70)

## 2023-08-08 LAB — PROTIME-INR
INR: 2.7 — ABNORMAL HIGH (ref 0.8–1.2)
Prothrombin Time: 29.5 s — ABNORMAL HIGH (ref 11.4–15.2)

## 2023-08-08 MED ORDER — METOPROLOL SUCCINATE ER 25 MG PO TB24
25.0000 mg | ORAL_TABLET | Freq: Every day | ORAL | 0 refills | Status: DC
  Filled 2023-08-08: qty 30, 30d supply, fill #0

## 2023-08-08 MED ORDER — LOSARTAN POTASSIUM 50 MG PO TABS
50.0000 mg | ORAL_TABLET | Freq: Every day | ORAL | 0 refills | Status: DC
  Filled 2023-08-08: qty 30, 30d supply, fill #0

## 2023-08-08 MED ORDER — WARFARIN SODIUM 5 MG PO TABS
5.0000 mg | ORAL_TABLET | Freq: Once | ORAL | Status: DC
Start: 2023-08-08 — End: 2023-08-08

## 2023-08-08 MED ORDER — LEVETIRACETAM 750 MG PO TABS
750.0000 mg | ORAL_TABLET | Freq: Two times a day (BID) | ORAL | 0 refills | Status: DC
  Filled 2023-08-08: qty 30, 15d supply, fill #0

## 2023-08-08 NOTE — Progress Notes (Signed)
 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  Allergies  Allergen Reactions   Penicillins Other (See Comments)    Couldn't walk    Patient Measurements: Height: (P) 4' 11 (149.9 cm) Weight: 94.8 kg (209 lb 1.6 oz) IBW/kg (Calculated) : (P) 43.2 HEPARIN  DW (KG): (P) 66.7  Vital Signs: Temp: 100 F (37.8 C) (07/14 0426) Temp Source: Oral (07/14 0426) BP: 122/53 (07/14 0426) Pulse Rate: 77 (07/14 0426)  Labs: Recent Labs    08/06/23 0423 08/06/23 1858 08/07/23 0628 08/08/23 0454  HGB 10.3*  --  10.3* 10.6*  HCT 31.5*  --  32.0* 32.6*  PLT 419*  --  351 365  LABPROT 25.1*  --  27.7* 29.5*  INR 2.2*  --  2.4* 2.7*  HEPARINUNFRC 0.60 0.52 0.40 0.37    Estimated Creatinine Clearance: 63.5 mL/min (by C-G formula based on SCr of 0.83 mg/dL).  Assessment: 16 YOF w/ PMH significant for PAF and mechanical MVR on Warfarin PTA. Held on admit due to SDH. S/p embolization on 07/26/23. Cleared to resume warfarin and heparin  bridge on 07/28/23 after repeat head CT.  Targeting low therapeutic heparin  levels.  PTA warfarin regimen: 5 mg daily; last dose 07/22/23 prior to admit.  7/12 PM: Heparin  level supra-therapeutic at 0.52 with heparin  running at 1150 units/hr. No signs of bleeding. Hgb stable, Plts stable.  7/14 AM: Heparin  level therapeutic at 0.37 with heparin  running at 1050 units/hr. No signs of bleeding / pauses w/ gtt. Hgb stable, plts 365. INR 2.7, Therapeutic.  Goal of Therapy:  Heparin  level 0.3-0.5 units/ml INR 2.5-3.5 Monitor platelets by anticoagulation protocol: Yes   Plan:  Continue heparin  to 1050 units/hr Warfarin 5 mg x1(home regimen 5 mg daily) Daily heparin  level, PT/INR and CBC. Monitor for signs/symptoms of bleeding.   Benedetta Heath BS, PharmD, BCPS Clinical Pharmacist 08/08/2023 6:27 AM  Contact: [phone] after 3 PM  Be curious, not judgmental... -Davina Sprinkles

## 2023-08-08 NOTE — TOC Transition Note (Signed)
 Transition of Care [facility]) - Discharge Note   Patient Details  Name: Natasha Davis MRN: [id number] Date of Birth: [birth year]  [facility] CM/SW Contact:  Andrez JULIANNA George, RN Phone Number: 08/08/2023, 1:04 PM   Clinical Narrative:    Pt is discharging home with home health through [place]. Information on the AVS. Hedda will contact her for the first home visit. Walker provided from [facility] for home.  Pt has transportation home.   Final next level of care: Home w Home Health Services Barriers to Discharge: No Barriers Identified   Patient Goals and CMS Choice            Discharge Placement                       Discharge Plan and Services Additional resources added to the After Visit Summary for     Discharge Planning Services: CM Consult            DME Arranged: Vannie rolling DME Agency: [facility] Date DME Agency Contacted: 08/08/23   Representative spoke with at DME Agency: dc lounge HH Arranged: PT HH Agency: [facility] Date [facility] Agency Contacted: 07/28/23   Representative spoke with at [facility] Agency: Darleene  Social Drivers of Health (SDOH) Interventions SDOH Screenings   Food Insecurity: No Food Insecurity (07/23/2023)  Housing: Low Risk  (07/23/2023)  Transportation Needs: No Transportation Needs (07/23/2023)  Utilities: Not At Risk (07/23/2023)  Social Connections: Moderately Integrated (07/23/2023)  Tobacco Use: Medium Risk (07/26/2023)     Readmission Risk Interventions     No data to display

## 2023-08-08 NOTE — Discharge Summary (Signed)
 Physician Discharge Summary   Patient: Natasha Davis MRN: [id number] DOB: Mar 02, 1955  Admit date:     07/22/2023  Discharge date: 08/08/23  Discharge Physician: Deliliah Room   PCP: Marvine Rush, MD   Recommendations at discharge:    Follow up out patient with PCP and neurosurgery, call to make appointments. Continue taking meds as prescribed  Discharge Diagnoses: Principal Problem:   Subdural hematoma (HCC) Active Problems:   H/O mitral valve replacement with mechanical valve   PAF (paroxysmal atrial fibrillation) (HCC)   Seizure disorder (HCC)   S/P MVR (mitral valve replacement)   Anticoagulation management encounter   Essential hypertension   History of CVA (cerebrovascular accident)   Combined systolic and diastolic congestive heart failure [facility])    Hospital Course:  68 year old with past medical history significant for seizure disorder, acute CVA (required admission from 07/18/2023 through 07/20/2023 for breakthrough seizure and acute CVA require increased dose of Keppra . History of mitral valve replacement on Coumadin , GERD, hyperlipidemia, paroxysmal A-fib, heart failure reduced ejection fraction 40 to 45%, morbid obesity presenting with worsening headache. On presentation CT head show acute bilateral subdural hematoma. Neurosurgery was consulted and recommended to hold Lovenox  and warfarin and recommended to repeat CT head in 8 to 10 hours. She underwent embolization of the right and left middle meningeal artery on 07/26/2023. Out patient follow up with Dr Lanis.   Subsequently patient had breakthrough seizure. Keppra  dose was increased to 750 mg PO BID. Neurology was consulted. LTM EEG was negative for seizure. LTM EEG discontinued.  Heparin  was started 7/3 and first dose of Coumadin  7/4 in the setting of mechanical St Jude MVR and afib (CHADSVASc 6) . INR 2.7 on discharge. Discharged on 5 mg coumadin  with outpt INR follow up as per protocol. HHS PT arranged.       Consultants: cardiology. Neurosurgery, Neurology Procedures performed: Onyx embolization of right middle meningeal artery  Onyx embolization of left middle meningeal artery   Disposition: Home health Diet recommendation:  Cardiac diet DISCHARGE MEDICATION: Allergies as of 08/08/2023       Reactions   Penicillins Other (See Comments)   Couldn't walk        Medication List     STOP taking these medications    enoxaparin  100 MG/ML injection Commonly known as: LOVENOX    metoprolol  tartrate 50 MG tablet Commonly known as: LOPRESSOR    potassium chloride  10 MEQ tablet Commonly known as: KLOR-CON        TAKE these medications    acetaminophen  650 MG CR tablet Commonly known as: TYLENOL  Take 1,300 mg by mouth daily as needed for pain. Pain   atorvastatin  20 MG tablet Commonly known as: LIPITOR TAKE 1 TABLET BY MOUTH EVERY DAY What changed: when to take this   Centrum Silver Adult 50+ Tabs Take 1 tablet by mouth in the morning.   levETIRAcetam  750 MG tablet Commonly known as: KEPPRA  Take 1 tablet (750 mg total) by mouth 2 (two) times daily. What changed:  medication strength how much to take   losartan  50 MG tablet Commonly known as: COZAAR  Take 1 tablet (50 mg total) by mouth daily. Start taking on: August 09, 2023   metoprolol  succinate 25 MG 24 hr tablet Commonly known as: TOPROL -XL Take 1 tablet (25 mg total) by mouth daily. Start taking on: August 09, 2023   omeprazole  20 MG capsule Commonly known as: PRILOSEC TAKE 2 CAPSULES BY MOUTH EVERY DAY What changed:  when to take this reasons to take this  spironolactone  25 MG tablet Commonly known as: ALDACTONE  Take 1 tablet (25 mg total) by mouth daily.   triamterene -hydrochlorothiazide  37.5-25 MG tablet Commonly known as: MAXZIDE -25 Take 1 tablet by mouth daily.   warfarin 5 MG tablet Commonly known as: COUMADIN  Take as directed. If you are unsure how to take this medication, talk to your  nurse or doctor. Original instructions: TAKE 1 TABLET BY MOUTH DAILY OR AS DIRECTED BY COUMADIN  CLINIC What changed:  how much to take how to take this when to take this               Durable Medical Equipment  (From admission, onward)           Start     Ordered   07/28/23 1025  For home use only DME Walker rolling  Once       Question Answer Comment  Walker: With 5 Inch Wheels   Patient needs a walker to treat with the following condition Weakness      07/28/23 1024            Follow-up Information     [facility], [facility] Follow up.   Specialty: Home Health Services Why: Hedda will contact you for the first home visit. Contact information: [address] [place] [address] [phone]         Lanis Pupa, MD Follow up in 2 week(s).   Specialty: Neurosurgery Contact information: [address] [place] [address] [phone]         Marvine Rush, MD. Call.   Specialty: Family Medicine Contact information: [address] [place] [address] [phone]                Discharge Exam: Filed Weights   08/01/23 0500 08/03/23 0500 08/04/23 0500  Weight: 96.1 kg 97.1 kg 94.8 kg   Constitutional: NAD, calm, comfortable Eyes: PERRL, lids and conjunctivae normal ENMT: Mucous membranes are moist. Posterior pharynx clear of any exudate or lesions.Normal dentition.  Neck: normal, supple, no masses, no thyromegaly Respiratory: clear to auscultation bilaterally, no wheezing, no crackles. Normal respiratory effort. No accessory muscle use.  Cardiovascular: Regular rate and rhythm, no murmurs / rubs / gallops. No extremity edema. 2+ pedal pulses. No carotid bruits.  Abdomen: no tenderness, no masses palpated. No hepatosplenomegaly. Bowel sounds positive.  Musculoskeletal: no clubbing / cyanosis. No joint deformity upper and lower extremities. Good ROM, no contractures. Normal muscle tone.   Skin: no rashes, lesions, ulcers. No induration Neurologic: CN 2-12 grossly intact. Sensation intact, DTR normal. Strength 5/5 x all 4 extremities.  Psychiatric: Normal judgment and insight. Alert and oriented x 3. Normal mood.    Condition at discharge: good  The results of significant diagnostics from this hospitalization (including imaging, microbiology, ancillary and laboratory) are listed below for reference.   Imaging Studies: CT HEAD WO CONTRAST ( ) Result Date: 07/28/2023 CLINICAL DATA:  68 year old female with bilateral subdural hematoma. EXAM: CT HEAD WITHOUT CONTRAST TECHNIQUE: Contiguous axial images were obtained from the base of the skull through the vertex without intravenous contrast. RADIATION DOSE REDUCTION: This exam was performed according to the departmental dose-optimization program which includes automated exposure control, adjustment of the mA and/or kV according to patient size and/or use of iterative reconstruction technique. COMPARISON:  Head CT 07/23/2023 and earlier. FINDINGS: Brain: Chronic right hemisphere hypodense encephalomalacia is stable. Bilateral mixed density but frequently hyperdense subdural hematoma persists. Left side collection now measures up to 4 mm, frequently  2 mm along the left lateral hemisphere, decreased (previously up to 6 mm). 7 mm thick blood products along the left tentorium not significantly changed. Contralateral right side subdural is superimposed on chronic encephalomalacia and remains more lobulated and mixed density. Anteriorly the collection is up to 10 mm in thickness. Lobulated right posterior convexity hyperdense component up to 11 mm is stable. More hypodense anterior right convexity component up to 10 mm now on series 5, image 51. Small posterior tentorial component is stable. But trace rightward midline shift now, no significant mass effect on the ventricles. Basilar cisterns are patent. No new areas of intracranial hemorrhage. Stable  gray-white matter differentiation throughout the brain. No cortically based acute infarct identified. Left thalamic chronic lacunar infarct is stable. Vascular: Interval bilateral middle meningeal artery embolization with hyperdense material. Calcified atherosclerosis at the skull base. Skull: Stable and intact. Sinuses/Orbits: Visualized paranasal sinuses and mastoids are stable and well aerated. Other: Stable orbit and scalp soft tissues. IMPRESSION: 1. Interval bilateral middle meningeal artery embolization. Left side SDH has decreased, now generally less than 4 mm in thickness. Right side SDH remains larger, more lobulated and mixed density, frequently 10 mm in thickness, and is superimposed on chronic right hemisphere encephalomalacia as before. 2. But no significant midline shift or intracranial mass effect now. 3. No new intracranial abnormality identified. Electronically Signed   By: VEAR Hurst M.D.   On: 07/28/2023 10:06   IR ANGIO EXTERNAL CAROTID SEL EXT CAROTID BILAT MOD SED ENDOVASCULAR NEUROSURGERY OPERATIVE NOTE  PROCEDURE: Onyx embolization of right middle meningeal artery Onyx embolization of left middle meningeal artery  HISTORY:  The patient is a 68 y.o. yo female presenting to the hospital with headache with a history of mechanical mitral valve on Coumadin  and Lovenox .  Her workup included CT scan demonstrating acute bilateral subdural hematomas.  Her Coumadin  was reversed.  While she did not require operative decompression, she is at high risk for stroke off anticoagulation given her mechanical valve.  We have therefore elected to proceed with middle meningeal artery embolization bilaterally in order to minimize time off anticoagulation and decrease chance of need for operative decompression.  Risks, benefits, and alternatives to the procedure were all reviewed in detail with the patient and her family.  After all questions were answered informed consent was obtained and witnessed.  APPROACH:   The technical aspects of the procedure as well as its potential risks and benefits were reviewed with the patient and family. These risks included but were not limited stroke leading to weakness, numbness, paralysis, coma, death, allergic reaction, damage to organs/vital structures, and hematoma formation. With an understanding of these risks, informed consent was obtained and witnessed.   The patient was placed in the supine position on the angiography table and the skin of right groin prepped in the usual sterile fashion. The procedure was performed under general anesthesia monitored by the anesthesia service.  Short 5Fr sheath was placed in the right common femoral artery using standard seldinger technique. Position of the sheath was documented with fluoro-phase images.   HEPARIN : 2000 Units total.   CONTRAST AGENT: See IR records  FLUOROSCOPY TIME: See IR records   CATHETER(S) AND WIRE(S):   5-French MPD guide catheter  0.035 glidewire  Apollo microcatheter x2 Chikai 10 microwire  LIQUID EMBOLIC AGENT USED: Onyx 18  VESSELS CATHETERIZED:  Right internal carotid  Right external carotid  Right middle meningeal artery  Left internal carotid  Left external carotid  Left middle meningeal artery  Right common femoral  VESSELS STUDIED:  Right internal carotid, head Right external carotid, head Right middle meningeal artery, microcatheter run Right external carotid, post-embolization  Left internal carotid, head Left external carotid, head Left middle meningeal artery, microcatheter run Left external carotid, post-embolization  Right femoral  PROCEDURAL NARRATIVE:  The MPD guide catheter was introduced over the microwire.  The right internal carotid artery was selected.  Cerebral angiogram was taken.  The guide catheter was then positioned in the proximal right external carotid artery and angiogram again taken.  After review of the images I elected to proceed with the embolization.  Under roadmap guidance, the Apollo  microcatheter was introduced over the microwire and the middle meningeal artery was selected.  Microcatheter run was then taken.  After review of the images again, we elected to proceed with the embolization.  The microwire was removed and the catheter was flushed with DMSO.  Under standard roadmap and blank roadmap technique, the middle meningeal artery was embolized with Onyx.  After completion of the embolization, the microcatheter was removed without incident.  Final control angiogram was taken through the guide catheter.  After review of the images, the guide catheter was withdrawn into the aortic arch.  At this point the left internal carotid artery was selected.  Cerebral angiogram was taken.  The guide catheter was then positioned in the proximal left external carotid artery and angiogram again taken.  After review of the images I elected to proceed with the left-sided embolization.  Under roadmap guidance, the Apollo microcatheter was introduced over the microwire and the middle meningeal artery was selected.  Microcatheter run was then taken.  The microwire was removed and the catheter was flushed with DMSO.  Under standard roadmap and blank roadmap technique, the middle meningeal artery was embolized with Onyx.  After completion of the embolization, the microcatheter was removed without incident.  Final control angiogram was taken through the guide catheter.   INTERPRETATION:  Right internal carotid, head:  Injection reveals the presence of a widely patent ICA, M1, and A1 segments and their branches. No aneurysms, arteriovenous malformations, or high flow fistulas are visualized.  The ophthalmic artery appears to have normal origin with visualized retinal/choroidal perfusion.  The parenchymal and venous phases are unremarkable, with mild mass effect upon the convexity from the known overlying chronic subdural hematoma. The venous sinuses are widely patent.   Right external carotid, head:  Visualized  cranial branches of the external carotid artery are unremarkable.  Of note, there is normal origin of the middle meningeal artery.  No perfusion of the brain is noted.  There is no opacification of the intracranial cortical veins or dural venous sinuses.  Right middle meningeal artery: Microcatheter run taken in the middle meningeal artery reveals no perfusion of the underlying brain.   Right external carotid, post-embolization: The visualized branches of the external carotid artery remain unremarkable.  Onyx cast is seen within the intracranial middle meningeal artery, with occlusion of this vessel distal to its origin, proximal to its entrance into the skull base.  Left internal carotid, head:  Injection reveals the presence of a widely patent ICA, M1, and A1 segments and their branches. No aneurysms, arteriovenous malformations, or high flow fistulas are visualized.  The ophthalmic artery appears to have normal origin with visualized retinal/choroidal perfusion.  The parenchymal and venous phases are unremarkable. The venous sinuses are widely patent.   Left external carotid, head:  Visualized cranial branches of the external carotid artery are unremarkable.  Of note, there is normal origin of the middle meningeal artery.  No perfusion of the brain is noted.  There is no opacification of the intracranial cortical veins or dural venous sinuses.  Left middle meningeal artery: Microcatheter run taken in the middle meningeal artery reveals no perfusion of the underlying brain.   Left external carotid, post-embolization: The visualized branches of the external carotid artery remain unremarkable.  Onyx cast is seen within the intracranial middle meningeal artery, with occlusion of this vessel at the level of foramen spinosum.  Right femoral:   Normal vessel. No significant atherosclerotic disease. Arterial sheath in adequate position at the right common femoral artery bifurcation.  DISPOSITION: Upon completion of the  study, the femoral sheath was removed and hemostasis obtained by manual compression. Good proximal and distal lower extremity pulses were documented upon achievement of hemostasis. The procedure was well tolerated and no early complications were observed.  The patient was transferred to the postanesthesia care unit to be positioned flat in bed for 3 hours.   IMPRESSION: 1. Successful Onyx embolization of bilateral middle meningeal arteries for subdural hematoma.    Gerldine Maizes, MD [facility]  Electronically signed by Maizes Gerldine, MD at 07/26/2023  2:56 PM   IR ANGIO INTRA EXTRACRAN SEL INTERNAL CAROTID BILAT MOD SED ENDOVASCULAR NEUROSURGERY OPERATIVE NOTE  PROCEDURE: Onyx embolization of right middle meningeal artery Onyx embolization of left middle meningeal artery  HISTORY:  The patient is a 68 y.o. yo female presenting to the hospital with headache with a history of mechanical mitral valve on Coumadin  and Lovenox .  Her workup included CT scan demonstrating acute bilateral subdural hematomas.  Her Coumadin  was reversed.  While she did not require operative decompression, she is at high risk for stroke off anticoagulation given her mechanical valve.  We have therefore elected to proceed with middle meningeal artery embolization bilaterally in order to minimize time off anticoagulation and decrease chance of need for operative decompression.  Risks, benefits, and alternatives to the procedure were all reviewed in detail with the patient and her family.  After all questions were answered informed consent was obtained and witnessed.  APPROACH:  The technical aspects of the procedure as well as its potential risks and benefits were reviewed with the patient and family. These risks included but were not limited stroke leading to weakness, numbness, paralysis, coma, death, allergic reaction, damage to organs/vital structures, and hematoma formation. With an understanding of  these risks, informed consent was obtained and witnessed.   The patient was placed in the supine position on the angiography table and the skin of right groin prepped in the usual sterile fashion. The procedure was performed under general anesthesia monitored by the anesthesia service.  Short 5Fr sheath was placed in the right common femoral artery using standard seldinger technique. Position of the sheath was documented with fluoro-phase images.   HEPARIN : 2000 Units total.   CONTRAST AGENT: See IR records  FLUOROSCOPY TIME: See IR records   CATHETER(S) AND WIRE(S):   5-French MPD guide catheter  0.035 glidewire  Apollo microcatheter x2 Chikai 10 microwire  LIQUID EMBOLIC AGENT USED: Onyx 18  VESSELS CATHETERIZED:  Right internal carotid  Right external carotid  Right middle meningeal artery  Left internal carotid  Left external carotid  Left middle meningeal artery  Right common femoral  VESSELS STUDIED:  Right internal carotid, head Right external carotid, head Right middle meningeal artery, microcatheter run Right external carotid, post-embolization  Left internal carotid,  head Left external carotid, head Left middle meningeal artery, microcatheter run Left external carotid, post-embolization  Right femoral  PROCEDURAL NARRATIVE:  The MPD guide catheter was introduced over the microwire.  The right internal carotid artery was selected.  Cerebral angiogram was taken.  The guide catheter was then positioned in the proximal right external carotid artery and angiogram again taken.  After review of the images I elected to proceed with the embolization.  Under roadmap guidance, the Apollo microcatheter was introduced over the microwire and the middle meningeal artery was selected.  Microcatheter run was then taken.  After review of the images again, we elected to proceed with the embolization.  The microwire was removed and the catheter was flushed with DMSO.  Under standard roadmap and blank roadmap technique, the  middle meningeal artery was embolized with Onyx.  After completion of the embolization, the microcatheter was removed without incident.  Final control angiogram was taken through the guide catheter.  After review of the images, the guide catheter was withdrawn into the aortic arch.  At this point the left internal carotid artery was selected.  Cerebral angiogram was taken.  The guide catheter was then positioned in the proximal left external carotid artery and angiogram again taken.  After review of the images I elected to proceed with the left-sided embolization.  Under roadmap guidance, the Apollo microcatheter was introduced over the microwire and the middle meningeal artery was selected.  Microcatheter run was then taken.  The microwire was removed and the catheter was flushed with DMSO.  Under standard roadmap and blank roadmap technique, the middle meningeal artery was embolized with Onyx.  After completion of the embolization, the microcatheter was removed without incident.  Final control angiogram was taken through the guide catheter.   INTERPRETATION:  Right internal carotid, head:  Injection reveals the presence of a widely patent ICA, M1, and A1 segments and their branches. No aneurysms, arteriovenous malformations, or high flow fistulas are visualized.  The ophthalmic artery appears to have normal origin with visualized retinal/choroidal perfusion.  The parenchymal and venous phases are unremarkable, with mild mass effect upon the convexity from the known overlying chronic subdural hematoma. The venous sinuses are widely patent.   Right external carotid, head:  Visualized cranial branches of the external carotid artery are unremarkable.  Of note, there is normal origin of the middle meningeal artery.  No perfusion of the brain is noted.  There is no opacification of the intracranial cortical veins or dural venous sinuses.  Right middle meningeal artery: Microcatheter run taken in the middle meningeal  artery reveals no perfusion of the underlying brain.   Right external carotid, post-embolization: The visualized branches of the external carotid artery remain unremarkable.  Onyx cast is seen within the intracranial middle meningeal artery, with occlusion of this vessel distal to its origin, proximal to its entrance into the skull base.  Left internal carotid, head:  Injection reveals the presence of a widely patent ICA, M1, and A1 segments and their branches. No aneurysms, arteriovenous malformations, or high flow fistulas are visualized.  The ophthalmic artery appears to have normal origin with visualized retinal/choroidal perfusion.  The parenchymal and venous phases are unremarkable. The venous sinuses are widely patent.   Left external carotid, head:  Visualized cranial branches of the external carotid artery are unremarkable.  Of note, there is normal origin of the middle meningeal artery.  No perfusion of the brain is noted.  There is no opacification of the intracranial cortical  veins or dural venous sinuses.  Left middle meningeal artery: Microcatheter run taken in the middle meningeal artery reveals no perfusion of the underlying brain.   Left external carotid, post-embolization: The visualized branches of the external carotid artery remain unremarkable.  Onyx cast is seen within the intracranial middle meningeal artery, with occlusion of this vessel at the level of foramen spinosum.  Right femoral:   Normal vessel. No significant atherosclerotic disease. Arterial sheath in adequate position at the right common femoral artery bifurcation.  DISPOSITION: Upon completion of the study, the femoral sheath was removed and hemostasis obtained by manual compression. Good proximal and distal lower extremity pulses were documented upon achievement of hemostasis. The procedure was well tolerated and no early complications were observed.  The patient was transferred to the postanesthesia care unit to be positioned  flat in bed for 3 hours.   IMPRESSION: 1. Successful Onyx embolization of bilateral middle meningeal arteries for subdural hematoma.    Gerldine Maizes, MD [facility]  Electronically signed by Maizes Gerldine, MD at 07/26/2023  2:56 PM   IR NEURO EACH ADD'L AFTER BASIC UNI LEFT (MS) ENDOVASCULAR NEUROSURGERY OPERATIVE NOTE  PROCEDURE: Onyx embolization of right middle meningeal artery Onyx embolization of left middle meningeal artery  HISTORY:  The patient is a 68 y.o. yo female presenting to the hospital with headache with a history of mechanical mitral valve on Coumadin  and Lovenox .  Her workup included CT scan demonstrating acute bilateral subdural hematomas.  Her Coumadin  was reversed.  While she did not require operative decompression, she is at high risk for stroke off anticoagulation given her mechanical valve.  We have therefore elected to proceed with middle meningeal artery embolization bilaterally in order to minimize time off anticoagulation and decrease chance of need for operative decompression.  Risks, benefits, and alternatives to the procedure were all reviewed in detail with the patient and her family.  After all questions were answered informed consent was obtained and witnessed.  APPROACH:  The technical aspects of the procedure as well as its potential risks and benefits were reviewed with the patient and family. These risks included but were not limited stroke leading to weakness, numbness, paralysis, coma, death, allergic reaction, damage to organs/vital structures, and hematoma formation. With an understanding of these risks, informed consent was obtained and witnessed.   The patient was placed in the supine position on the angiography table and the skin of right groin prepped in the usual sterile fashion. The procedure was performed under general anesthesia monitored by the anesthesia service.  Short 5Fr sheath was placed in the right common femoral  artery using standard seldinger technique. Position of the sheath was documented with fluoro-phase images.   HEPARIN : 2000 Units total.   CONTRAST AGENT: See IR records  FLUOROSCOPY TIME: See IR records   CATHETER(S) AND WIRE(S):   5-French MPD guide catheter  0.035 glidewire  Apollo microcatheter x2 Chikai 10 microwire  LIQUID EMBOLIC AGENT USED: Onyx 18  VESSELS CATHETERIZED:  Right internal carotid  Right external carotid  Right middle meningeal artery  Left internal carotid  Left external carotid  Left middle meningeal artery  Right common femoral  VESSELS STUDIED:  Right internal carotid, head Right external carotid, head Right middle meningeal artery, microcatheter run Right external carotid, post-embolization  Left internal carotid, head Left external carotid, head Left middle meningeal artery, microcatheter run Left external carotid, post-embolization  Right femoral  PROCEDURAL NARRATIVE:  The MPD guide catheter was introduced over  the microwire.  The right internal carotid artery was selected.  Cerebral angiogram was taken.  The guide catheter was then positioned in the proximal right external carotid artery and angiogram again taken.  After review of the images I elected to proceed with the embolization.  Under roadmap guidance, the Apollo microcatheter was introduced over the microwire and the middle meningeal artery was selected.  Microcatheter run was then taken.  After review of the images again, we elected to proceed with the embolization.  The microwire was removed and the catheter was flushed with DMSO.  Under standard roadmap and blank roadmap technique, the middle meningeal artery was embolized with Onyx.  After completion of the embolization, the microcatheter was removed without incident.  Final control angiogram was taken through the guide catheter.  After review of the images, the guide catheter was withdrawn into the aortic arch.  At this point the left internal carotid artery was selected.   Cerebral angiogram was taken.  The guide catheter was then positioned in the proximal left external carotid artery and angiogram again taken.  After review of the images I elected to proceed with the left-sided embolization.  Under roadmap guidance, the Apollo microcatheter was introduced over the microwire and the middle meningeal artery was selected.  Microcatheter run was then taken.  The microwire was removed and the catheter was flushed with DMSO.  Under standard roadmap and blank roadmap technique, the middle meningeal artery was embolized with Onyx.  After completion of the embolization, the microcatheter was removed without incident.  Final control angiogram was taken through the guide catheter.   INTERPRETATION:  Right internal carotid, head:  Injection reveals the presence of a widely patent ICA, M1, and A1 segments and their branches. No aneurysms, arteriovenous malformations, or high flow fistulas are visualized.  The ophthalmic artery appears to have normal origin with visualized retinal/choroidal perfusion.  The parenchymal and venous phases are unremarkable, with mild mass effect upon the convexity from the known overlying chronic subdural hematoma. The venous sinuses are widely patent.   Right external carotid, head:  Visualized cranial branches of the external carotid artery are unremarkable.  Of note, there is normal origin of the middle meningeal artery.  No perfusion of the brain is noted.  There is no opacification of the intracranial cortical veins or dural venous sinuses.  Right middle meningeal artery: Microcatheter run taken in the middle meningeal artery reveals no perfusion of the underlying brain.   Right external carotid, post-embolization: The visualized branches of the external carotid artery remain unremarkable.  Onyx cast is seen within the intracranial middle meningeal artery, with occlusion of this vessel distal to its origin, proximal to its entrance into the skull base.  Left  internal carotid, head:  Injection reveals the presence of a widely patent ICA, M1, and A1 segments and their branches. No aneurysms, arteriovenous malformations, or high flow fistulas are visualized.  The ophthalmic artery appears to have normal origin with visualized retinal/choroidal perfusion.  The parenchymal and venous phases are unremarkable. The venous sinuses are widely patent.   Left external carotid, head:  Visualized cranial branches of the external carotid artery are unremarkable.  Of note, there is normal origin of the middle meningeal artery.  No perfusion of the brain is noted.  There is no opacification of the intracranial cortical veins or dural venous sinuses.  Left middle meningeal artery: Microcatheter run taken in the middle meningeal artery reveals no perfusion of the underlying brain.   Left external  carotid, post-embolization: The visualized branches of the external carotid artery remain unremarkable.  Onyx cast is seen within the intracranial middle meningeal artery, with occlusion of this vessel at the level of foramen spinosum.  Right femoral:   Normal vessel. No significant atherosclerotic disease. Arterial sheath in adequate position at the right common femoral artery bifurcation.  DISPOSITION: Upon completion of the study, the femoral sheath was removed and hemostasis obtained by manual compression. Good proximal and distal lower extremity pulses were documented upon achievement of hemostasis. The procedure was well tolerated and no early complications were observed.  The patient was transferred to the postanesthesia care unit to be positioned flat in bed for 3 hours.   IMPRESSION: 1. Successful Onyx embolization of bilateral middle meningeal arteries for subdural hematoma.    Gerldine Maizes, MD [facility]  Electronically signed by Maizes Gerldine, MD at 07/26/2023  2:56 PM   IR NEURO EACH ADD'L AFTER BASIC UNI RIGHT (MS) ENDOVASCULAR NEUROSURGERY  OPERATIVE NOTE  PROCEDURE: Onyx embolization of right middle meningeal artery Onyx embolization of left middle meningeal artery  HISTORY:  The patient is a 68 y.o. yo female presenting to the hospital with headache with a history of mechanical mitral valve on Coumadin  and Lovenox .  Her workup included CT scan demonstrating acute bilateral subdural hematomas.  Her Coumadin  was reversed.  While she did not require operative decompression, she is at high risk for stroke off anticoagulation given her mechanical valve.  We have therefore elected to proceed with middle meningeal artery embolization bilaterally in order to minimize time off anticoagulation and decrease chance of need for operative decompression.  Risks, benefits, and alternatives to the procedure were all reviewed in detail with the patient and her family.  After all questions were answered informed consent was obtained and witnessed.  APPROACH:  The technical aspects of the procedure as well as its potential risks and benefits were reviewed with the patient and family. These risks included but were not limited stroke leading to weakness, numbness, paralysis, coma, death, allergic reaction, damage to organs/vital structures, and hematoma formation. With an understanding of these risks, informed consent was obtained and witnessed.   The patient was placed in the supine position on the angiography table and the skin of right groin prepped in the usual sterile fashion. The procedure was performed under general anesthesia monitored by the anesthesia service.  Short 5Fr sheath was placed in the right common femoral artery using standard seldinger technique. Position of the sheath was documented with fluoro-phase images.   HEPARIN : 2000 Units total.   CONTRAST AGENT: See IR records  FLUOROSCOPY TIME: See IR records   CATHETER(S) AND WIRE(S):   5-French MPD guide catheter  0.035 glidewire  Apollo microcatheter x2 Chikai 10 microwire  LIQUID EMBOLIC AGENT USED:  Onyx 18  VESSELS CATHETERIZED:  Right internal carotid  Right external carotid  Right middle meningeal artery  Left internal carotid  Left external carotid  Left middle meningeal artery  Right common femoral  VESSELS STUDIED:  Right internal carotid, head Right external carotid, head Right middle meningeal artery, microcatheter run Right external carotid, post-embolization  Left internal carotid, head Left external carotid, head Left middle meningeal artery, microcatheter run Left external carotid, post-embolization  Right femoral  PROCEDURAL NARRATIVE:  The MPD guide catheter was introduced over the microwire.  The right internal carotid artery was selected.  Cerebral angiogram was taken.  The guide catheter was then positioned in the proximal right external carotid artery  and angiogram again taken.  After review of the images I elected to proceed with the embolization.  Under roadmap guidance, the Apollo microcatheter was introduced over the microwire and the middle meningeal artery was selected.  Microcatheter run was then taken.  After review of the images again, we elected to proceed with the embolization.  The microwire was removed and the catheter was flushed with DMSO.  Under standard roadmap and blank roadmap technique, the middle meningeal artery was embolized with Onyx.  After completion of the embolization, the microcatheter was removed without incident.  Final control angiogram was taken through the guide catheter.  After review of the images, the guide catheter was withdrawn into the aortic arch.  At this point the left internal carotid artery was selected.  Cerebral angiogram was taken.  The guide catheter was then positioned in the proximal left external carotid artery and angiogram again taken.  After review of the images I elected to proceed with the left-sided embolization.  Under roadmap guidance, the Apollo microcatheter was introduced over the microwire and the middle meningeal artery was  selected.  Microcatheter run was then taken.  The microwire was removed and the catheter was flushed with DMSO.  Under standard roadmap and blank roadmap technique, the middle meningeal artery was embolized with Onyx.  After completion of the embolization, the microcatheter was removed without incident.  Final control angiogram was taken through the guide catheter.   INTERPRETATION:  Right internal carotid, head:  Injection reveals the presence of a widely patent ICA, M1, and A1 segments and their branches. No aneurysms, arteriovenous malformations, or high flow fistulas are visualized.  The ophthalmic artery appears to have normal origin with visualized retinal/choroidal perfusion.  The parenchymal and venous phases are unremarkable, with mild mass effect upon the convexity from the known overlying chronic subdural hematoma. The venous sinuses are widely patent.   Right external carotid, head:  Visualized cranial branches of the external carotid artery are unremarkable.  Of note, there is normal origin of the middle meningeal artery.  No perfusion of the brain is noted.  There is no opacification of the intracranial cortical veins or dural venous sinuses.  Right middle meningeal artery: Microcatheter run taken in the middle meningeal artery reveals no perfusion of the underlying brain.   Right external carotid, post-embolization: The visualized branches of the external carotid artery remain unremarkable.  Onyx cast is seen within the intracranial middle meningeal artery, with occlusion of this vessel distal to its origin, proximal to its entrance into the skull base.  Left internal carotid, head:  Injection reveals the presence of a widely patent ICA, M1, and A1 segments and their branches. No aneurysms, arteriovenous malformations, or high flow fistulas are visualized.  The ophthalmic artery appears to have normal origin with visualized retinal/choroidal perfusion.  The parenchymal and venous phases are  unremarkable. The venous sinuses are widely patent.   Left external carotid, head:  Visualized cranial branches of the external carotid artery are unremarkable.  Of note, there is normal origin of the middle meningeal artery.  No perfusion of the brain is noted.  There is no opacification of the intracranial cortical veins or dural venous sinuses.  Left middle meningeal artery: Microcatheter run taken in the middle meningeal artery reveals no perfusion of the underlying brain.   Left external carotid, post-embolization: The visualized branches of the external carotid artery remain unremarkable.  Onyx cast is seen within the intracranial middle meningeal artery, with occlusion of this vessel at  the level of foramen spinosum.  Right femoral:   Normal vessel. No significant atherosclerotic disease. Arterial sheath in adequate position at the right common femoral artery bifurcation.  DISPOSITION: Upon completion of the study, the femoral sheath was removed and hemostasis obtained by manual compression. Good proximal and distal lower extremity pulses were documented upon achievement of hemostasis. The procedure was well tolerated and no early complications were observed.  The patient was transferred to the postanesthesia care unit to be positioned flat in bed for 3 hours.   IMPRESSION: 1. Successful Onyx embolization of bilateral middle meningeal arteries for subdural hematoma.    Gerldine Maizes, MD [facility]  Electronically signed by Maizes Gerldine, MD at 07/26/2023  2:56 PM   IR Transcath/Emboliz ENDOVASCULAR NEUROSURGERY OPERATIVE NOTE  PROCEDURE: Onyx embolization of right middle meningeal artery Onyx embolization of left middle meningeal artery  HISTORY:  The patient is a 68 y.o. yo female presenting to the hospital with headache with a history of mechanical mitral valve on Coumadin  and Lovenox .  Her workup included CT scan demonstrating acute bilateral subdural hematomas.   Her Coumadin  was reversed.  While she did not require operative decompression, she is at high risk for stroke off anticoagulation given her mechanical valve.  We have therefore elected to proceed with middle meningeal artery embolization bilaterally in order to minimize time off anticoagulation and decrease chance of need for operative decompression.  Risks, benefits, and alternatives to the procedure were all reviewed in detail with the patient and her family.  After all questions were answered informed consent was obtained and witnessed.  APPROACH:  The technical aspects of the procedure as well as its potential risks and benefits were reviewed with the patient and family. These risks included but were not limited stroke leading to weakness, numbness, paralysis, coma, death, allergic reaction, damage to organs/vital structures, and hematoma formation. With an understanding of these risks, informed consent was obtained and witnessed.   The patient was placed in the supine position on the angiography table and the skin of right groin prepped in the usual sterile fashion. The procedure was performed under general anesthesia monitored by the anesthesia service.  Short 5Fr sheath was placed in the right common femoral artery using standard seldinger technique. Position of the sheath was documented with fluoro-phase images.   HEPARIN : 2000 Units total.   CONTRAST AGENT: See IR records  FLUOROSCOPY TIME: See IR records   CATHETER(S) AND WIRE(S):   5-French MPD guide catheter  0.035 glidewire  Apollo microcatheter x2 Chikai 10 microwire  LIQUID EMBOLIC AGENT USED: Onyx 18  VESSELS CATHETERIZED:  Right internal carotid  Right external carotid  Right middle meningeal artery  Left internal carotid  Left external carotid  Left middle meningeal artery  Right common femoral  VESSELS STUDIED:  Right internal carotid, head Right external carotid, head Right middle meningeal artery, microcatheter run Right external carotid,  post-embolization  Left internal carotid, head Left external carotid, head Left middle meningeal artery, microcatheter run Left external carotid, post-embolization  Right femoral  PROCEDURAL NARRATIVE:  The MPD guide catheter was introduced over the microwire.  The right internal carotid artery was selected.  Cerebral angiogram was taken.  The guide catheter was then positioned in the proximal right external carotid artery and angiogram again taken.  After review of the images I elected to proceed with the embolization.  Under roadmap guidance, the Apollo microcatheter was introduced over the microwire and the middle meningeal artery was selected.  Microcatheter run was then taken.  After review of the images again, we elected to proceed with the embolization.  The microwire was removed and the catheter was flushed with DMSO.  Under standard roadmap and blank roadmap technique, the middle meningeal artery was embolized with Onyx.  After completion of the embolization, the microcatheter was removed without incident.  Final control angiogram was taken through the guide catheter.  After review of the images, the guide catheter was withdrawn into the aortic arch.  At this point the left internal carotid artery was selected.  Cerebral angiogram was taken.  The guide catheter was then positioned in the proximal left external carotid artery and angiogram again taken.  After review of the images I elected to proceed with the left-sided embolization.  Under roadmap guidance, the Apollo microcatheter was introduced over the microwire and the middle meningeal artery was selected.  Microcatheter run was then taken.  The microwire was removed and the catheter was flushed with DMSO.  Under standard roadmap and blank roadmap technique, the middle meningeal artery was embolized with Onyx.  After completion of the embolization, the microcatheter was removed without incident.  Final control angiogram was taken through the guide  catheter.   INTERPRETATION:  Right internal carotid, head:  Injection reveals the presence of a widely patent ICA, M1, and A1 segments and their branches. No aneurysms, arteriovenous malformations, or high flow fistulas are visualized.  The ophthalmic artery appears to have normal origin with visualized retinal/choroidal perfusion.  The parenchymal and venous phases are unremarkable, with mild mass effect upon the convexity from the known overlying chronic subdural hematoma. The venous sinuses are widely patent.   Right external carotid, head:  Visualized cranial branches of the external carotid artery are unremarkable.  Of note, there is normal origin of the middle meningeal artery.  No perfusion of the brain is noted.  There is no opacification of the intracranial cortical veins or dural venous sinuses.  Right middle meningeal artery: Microcatheter run taken in the middle meningeal artery reveals no perfusion of the underlying brain.   Right external carotid, post-embolization: The visualized branches of the external carotid artery remain unremarkable.  Onyx cast is seen within the intracranial middle meningeal artery, with occlusion of this vessel distal to its origin, proximal to its entrance into the skull base.  Left internal carotid, head:  Injection reveals the presence of a widely patent ICA, M1, and A1 segments and their branches. No aneurysms, arteriovenous malformations, or high flow fistulas are visualized.  The ophthalmic artery appears to have normal origin with visualized retinal/choroidal perfusion.  The parenchymal and venous phases are unremarkable. The venous sinuses are widely patent.   Left external carotid, head:  Visualized cranial branches of the external carotid artery are unremarkable.  Of note, there is normal origin of the middle meningeal artery.  No perfusion of the brain is noted.  There is no opacification of the intracranial cortical veins or dural venous sinuses.  Left middle  meningeal artery: Microcatheter run taken in the middle meningeal artery reveals no perfusion of the underlying brain.   Left external carotid, post-embolization: The visualized branches of the external carotid artery remain unremarkable.  Onyx cast is seen within the intracranial middle meningeal artery, with occlusion of this vessel at the level of foramen spinosum.  Right femoral:   Normal vessel. No significant atherosclerotic disease. Arterial sheath in adequate position at the right common femoral artery bifurcation.  DISPOSITION: Upon completion of the study, the femoral  sheath was removed and hemostasis obtained by manual compression. Good proximal and distal lower extremity pulses were documented upon achievement of hemostasis. The procedure was well tolerated and no early complications were observed.  The patient was transferred to the postanesthesia care unit to be positioned flat in bed for 3 hours.   IMPRESSION: 1. Successful Onyx embolization of bilateral middle meningeal arteries for subdural hematoma.    Gerldine Maizes, MD [facility]  Electronically signed by Maizes Gerldine, MD at 07/26/2023  2:56 PM   Overnight EEG with video Result Date: 07/24/2023 Shelton Arlin KIDD, MD     07/25/2023 10:35 AM Patient Name: TNIA Davis MRN: [id number] Epilepsy Attending: Arlin KIDD Shelton Referring Physician/Provider: Remi Pippin, NP Duration: 07/23/2023 1740 to 07/24/2023 1704 Patient history: 68 y.o. female with mechanical mitral valve and acute bilateral SDH, currently non-operative.  EEG to evaluate for seizure Level of alertness: Awake, asleep AEDs during EEG study: LEV Technical aspects: This EEG study was done with scalp electrodes positioned according to the 10-20 International system of electrode placement. Electrical activity was reviewed with band pass filter of 1-70Hz , sensitivity of 7 uV/mm, display speed of 47mm/sec with a 60Hz  notched filter applied as  appropriate. EEG data were recorded continuously and digitally stored.  Video monitoring was available and reviewed as appropriate. Description: EEG initially showed continuous generalized 3 to 6 Hz theta-delta slowing admixed with 12-15hz  beta activity. Gradually after around 2145 on 07/23/2023, EEG improved and showed posterior dominant rhythm of 8-9 Hz activity of moderate voltage (25-35 uV) seen predominantly in posterior head regions, symmetric and reactive to eye opening and eye closing. Sleep was characterized by vertex waves, sleep spindles (12 to 14 Hz), maximal frontocentral region. Intermittent generalized 5-6hz  theta slowing was also noted. Hyperventilation and photic stimulation were not performed.   ABNORMALITY - Continuous slow, generalized IMPRESSION: This study was initially suggestive of moderate to severe diffuse encephalopathy which gradually improved to mild diffuse encephalopathy. No seizures or epileptiform discharges were seen throughout the recording. Priyanka KIDD Shelton   CT HEAD WO CONTRAST ( ) Result Date: 07/23/2023 CLINICAL DATA:  68 year old female with bilateral subdural hematoma. EXAM: CT HEAD WITHOUT CONTRAST TECHNIQUE: Contiguous axial images were obtained from the base of the skull through the vertex without intravenous contrast. RADIATION DOSE REDUCTION: This exam was performed according to the departmental dose-optimization program which includes automated exposure control, adjustment of the mA and/or kV according to patient size and/or use of iterative reconstruction technique. COMPARISON:  0458 hours today and earlier. FINDINGS: Brain: Bilateral mostly hyperdense but mixed density subdural hematoma is stable since 0458 hours today, generally 6 mm in thickness on the left and more lobulated on the right, generally 6-8 mm on that side. Rightward midline shift of 3-4 mm is stable on coronal image 46. Stable ventricle size and configuration. Stable gray-white matter  differentiation throughout the brain. No new intracranial hemorrhage. Chronic right hemisphere encephalomalacia. Basilar cisterns remain patent. Vascular: Calcified atherosclerosis at the skull base. Skull: Intact, stable. Sinuses/Orbits: Visualized paranasal sinuses and mastoids are stable and well aerated. Other: Visualized orbits and scalp soft tissues are within normal limits. IMPRESSION: Stable from 0458 hours today. Bilateral Subdural Hematoma with rightward midline shift of 3-4 mm. Electronically Signed   By: VEAR Hurst M.D.   On: 07/23/2023 10:11   CT Head Wo Contrast Result Date: 07/23/2023 CLINICAL DATA:  68 year old female with bilateral subdural hematoma. EXAM: CT HEAD WITHOUT CONTRAST TECHNIQUE: Contiguous axial images were obtained from the  base of the skull through the vertex without intravenous contrast. RADIATION DOSE REDUCTION: This exam was performed according to the departmental dose-optimization program which includes automated exposure control, adjustment of the mA and/or kV according to patient size and/or use of iterative reconstruction technique. COMPARISON:  Head CT yesterday and earlier. FINDINGS: Brain: Mostly hyperdense bilateral subdural hematoma. Left-side hemorrhage thickness which is generally 6 mm (although approaching 10 mm at the left operculum on coronal image 45), stable. Lobulated right side subdural hematoma superimposed on chronic right hemisphere encephalomalacia is generally 6-8 mm in thickness and stable. Blood layering along the bilateral tentorium and falx again noted. Stable intracranial mass effect with rightward midline shift of 4 mm and partially effaced left lateral ventricle (coronal image 44 and series 3 image 17. No ventriculomegaly. No new intracranial hemorrhage identified. Basilar cisterns remain patent. Stable gray-white matter differentiation throughout the brain. Chronic right MCA territory cystic encephalomalacia. Small chronic left thalamic lacunar  infarct. Vascular: Stable.  Calcified atherosclerosis at the skull base. Skull: Stable and intact. Sinuses/Orbits: Visualized paranasal sinuses and mastoids are stable and well aerated. Other: Orbit and scalp soft tissues appears stable and negative. IMPRESSION: 1. Stable bilateral Subdural Hematomas, generally hyperdense and 6-8 mm thickness bilaterally. 2. Stable mild intracranial mass effect with 4 mm of rightward midline shift. 3. No new intracranial abnormality. Chronic right MCA territory infarct. Electronically Signed   By: VEAR Hurst M.D.   On: 07/23/2023 05:20   CT Head Wo Contrast Result Date: 07/22/2023 CLINICAL DATA:  Headache, tension-type recent stroke, on blood thinner EXAM: CT HEAD WITHOUT CONTRAST TECHNIQUE: Contiguous axial images were obtained from the base of the skull through the vertex without intravenous contrast. RADIATION DOSE REDUCTION: This exam was performed according to the departmental dose-optimization program which includes automated exposure control, adjustment of the mA and/or kV according to patient size and/or use of iterative reconstruction technique. COMPARISON:  CT head July 18, 2023. FINDINGS: Brain: Acute subdural hematomas along bilateral cerebral convexities measuring up to 8 mm in thickness on the right and 10 mm on the left. Acute hemorrhage tracks along the falx and bilateral tentorial leaflets with tentorial hemorrhage measuring 5 mm in thickness on the left and 3 mm on the right. Associated mass effect with 5 mm of rightward midline shift. Remote right MCA territory infarct, unchanged. Small remote left parietal infarct. No evidence of acute large vascular territory infarct or hydrocephalus. Vascular: No hyperdense vessel identified. Skull: No acute fracture. Sinuses/Orbits: Clear sinuses.  No acute orbital findings. IMPRESSION: Acute bilateral subdural hematomas measuring 8 mm on the right and till meters on the left. Hemorrhage tracks along the falx and tentorial  leaflets. Associated 5 mm of rightward midline shift. Findings discussed with Dr. Patsey via telephone at 9:31 p.m. Electronically Signed   By: Gilmore GORMAN Molt M.D.   On: 07/22/2023 21:35   ECHOCARDIOGRAM COMPLETE Result Date: 07/19/2023    ECHOCARDIOGRAM REPORT   Patient Name:   Natasha Davis Date of Exam: 07/19/2023 Medical Rec #:  [id number]        Height:       59.0 in Accession #:    [id number]       Weight:       212.1 lb Date of Birth:  11/29/1955        BSA:          1.891 m Patient Age:    67 years         BP:  170/62 mmHg Patient Gender: F                HR:           72 bpm. Exam Location:  Inpatient Procedure: 2D Echo, Cardiac Doppler, Color Doppler and Intracardiac            Opacification Agent (Both Spectral and Color Flow Doppler were            utilized during procedure). Indications:    Stroke I63.9  History:        Patient has prior history of Echocardiogram examinations, most                 recent 01/25/2023. 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 Procedure date:                 04/22/2010, Arrythmias:Atrial Fibrillation; Risk                 Factors:Hypertension.  Sonographer:    Koleen Popper RDCS Referring Phys: [id number] MARSA NOVAK MELVIN IMPRESSIONS  1. Left ventricular ejection fraction, by estimation, is 40 to 45%. The left ventricle has mildly decreased function. The left ventricle demonstrates global hypokinesis. There is moderate concentric left ventricular hypertrophy. Left ventricular diastolic parameters are indeterminate.  2. Right ventricular systolic function is mildly reduced. The right ventricular size is normal. There is normal pulmonary artery systolic pressure.  3. Left atrial size was severely dilated.  4. The mitral valve has been repaired/replaced. No evidence of mitral valve regurgitation. The mean mitral valve gradient is 4.3 mmHg.  5. The aortic valve is tricuspid. There is mild  calcification of the aortic valve. Aortic valve regurgitation is not visualized. Aortic valve sclerosis is present, with no evidence of aortic valve stenosis.  6. The inferior vena cava is normal in size with <50% respiratory variability, suggesting right atrial pressure of 8 mmHg. Comparison(s): RVSP has improved from prior study; there is no clear evidence of interatrial shunting. FINDINGS  Left Ventricle: Left ventricular ejection fraction, by estimation, is 40 to 45%. The left ventricle has mildly decreased function. The left ventricle demonstrates global hypokinesis. Definity  contrast agent was given IV to delineate the left ventricular  endocardial borders. The left ventricular internal cavity size was normal in size. There is moderate concentric left ventricular hypertrophy. Left ventricular diastolic parameters are indeterminate. Right Ventricle: The right ventricular size is normal. No increase in right ventricular wall thickness. Right ventricular systolic function is mildly reduced. There is normal pulmonary artery systolic pressure. The tricuspid regurgitant velocity is 2.41 m/s, and with an assumed right atrial pressure of 8 mmHg, the estimated right ventricular systolic pressure is 31.2 mmHg. Left Atrium: Left atrial size was severely dilated. Right Atrium: Right atrial size was not well visualized. Pericardium: There is no evidence of pericardial effusion. Mitral Valve: The mitral valve has been repaired/replaced. No evidence of mitral valve regurgitation. MV peak gradient, 15.3 mmHg. The mean mitral valve gradient is 4.3 mmHg. Tricuspid Valve: The tricuspid valve is normal in structure. Tricuspid valve regurgitation is mild . No evidence of tricuspid stenosis. Aortic Valve: The aortic valve is tricuspid. There is mild calcification of the aortic valve. There is mild aortic valve annular calcification. Aortic valve regurgitation is not visualized. Aortic valve sclerosis is present, with no evidence of  aortic valve stenosis. Pulmonic Valve: The  pulmonic valve was not well visualized. Pulmonic valve regurgitation is not visualized. No evidence of pulmonic stenosis. Aorta: The aortic root and ascending aorta are structurally normal, with no evidence of dilitation. Venous: The inferior vena cava is normal in size with less than 50% respiratory variability, suggesting right atrial pressure of 8 mmHg. IAS/Shunts: The atrial septum is grossly normal.  LEFT VENTRICLE PLAX 2D LVIDd:         4.90 cm LVIDs:         4.20 cm LV PW:         1.20 cm LV IVS:        1.30 cm LVOT diam:     1.90 cm LV SV:         40 LV SV Index:   21 LVOT Area:     2.84 cm  RIGHT VENTRICLE            IVC RV S prime:     7.24 cm/s  IVC diam: 2.10 cm TAPSE (M-mode): 1.1 cm LEFT ATRIUM              Index LA diam:        5.80 cm  3.07 cm/m LA Vol (A2C):   121.0 ml 63.99 ml/m LA Vol (A4C):   69.6 ml  36.81 ml/m LA Biplane Vol: 92.1 ml  48.71 ml/m  AORTIC VALVE LVOT Vmax:   63.70 cm/s LVOT Vmean:  46.300 cm/s LVOT VTI:    0.142 m  AORTA Ao Root diam: 2.90 cm Ao Asc diam:  3.30 cm MITRAL VALVE             TRICUSPID VALVE MV Area VTI:  1.06 cm   TR Peak grad:   23.2 mmHg MV Peak grad: 15.3 mmHg  TR Vmax:        241.00 cm/s MV Mean grad: 4.3 mmHg MV Vmax:      1.96 m/s   SHUNTS MV Vmean:     84.0 cm/s  Systemic VTI:  0.14 m                          Systemic Diam: 1.90 cm Stanly Leavens MD Electronically signed by Stanly Leavens MD Signature Date/Time: 07/19/2023/11:55:42 AM    Final    Overnight EEG with video Result Date: 07/19/2023 Shelton Arlin KIDD, MD     07/21/2023 12:12 PM Patient Name: Natasha Davis MRN: [id number] Epilepsy Attending: Arlin KIDD Shelton Referring Physician/Provider: Voncile Isles, MD Duration: 07/18/2023 1653 to 07/19/2023 1200  Patient history:  68 y.o. 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 EEG to evaluate for seizure.  Level of alertness: Awake, asleep  AEDs during EEG study: LEV  Technical aspects: This EEG study was done with scalp electrodes positioned according to the 10-20 International system of electrode placement. Electrical activity was reviewed with band pass filter of 1-70Hz , sensitivity of 7 uV/mm, display speed of 49mm/sec with a 60Hz  notched filter applied as appropriate. EEG data were recorded continuously and digitally stored. Video monitoring was available and reviewed as appropriate.  Description: The posterior dominant rhythm consists of 8-9 Hz activity of moderate voltage (25-35 uV) seen predominantly in posterior head regions, symmetric and reactive to eye  opening and eye closing. Sleep was characterized by vertex waves, sleep spindles (12 to 14 Hz), maximal frontocentral region. Hyperventilation and photic stimulation were not performed.    IMPRESSION: This study is within normal limits. No seizures or epileptiform discharges were seen throughout the recording.  Priyanka O Yadav   CT Head Wo Contrast Result Date: 07/18/2023 CLINICAL DATA:  Initial evaluation for acute mental status change, unknown cause. EXAM: CT HEAD WITHOUT CONTRAST TECHNIQUE: Contiguous axial images were obtained from the base of the skull through the vertex without intravenous contrast. RADIATION DOSE REDUCTION: This exam was performed according to the departmental dose-optimization program which includes automated exposure control, adjustment of the mA and/or kV according to patient size and/or use of iterative reconstruction technique. COMPARISON:  Prior CT and MRI from earlier the same day. FINDINGS: Brain: Cerebral volume within normal limits. Chronic right MCA distribution infarct again noted. Underlying mild chronic microvascular ischemic disease.  Previously identified small left parietal infarct not visible by CT. No other acute large vessel territory infarct. No acute intracranial hemorrhage. No mass lesion or midline shift. No hydrocephalus. No subdural collection by CT. Vascular: No abnormal hyperdense vessel. Skull: Scalp soft tissues and calvarium demonstrate no new finding. Sinuses/Orbits: Globes orbital soft tissues demonstrate no acute finding. Paranasal sinuses and mastoid air cells remain largely clear. Other: None. IMPRESSION: 1. No acute intracranial abnormality. Previously identified small left parietal infarct not visible by CT. 2. Chronic right MCA distribution infarct with underlying mild chronic microvascular ischemic disease. Electronically Signed   By: Morene Hoard M.D.   On: 07/18/2023 20:25   EEG adult Result Date: 07/18/2023 Shelton Arlin KIDD, MD     07/18/2023 10:42 PM Patient Name: Natasha Davis MRN: [id number] Epilepsy Attending: Arlin KIDD Shelton Referring Physician/Provider: Voncile Isles, MD Date: 07/18/2023 Duration: 21.43 mins Patient history:  68 y.o. 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 EEG to evaluate for seizure. Level of alertness: Awake, asleep AEDs during EEG study: LEV, Ativan  Technical aspects: This EEG study was done with scalp electrodes positioned according to the 10-20 International system of electrode placement. Electrical activity was reviewed with band pass filter of 1-70Hz , sensitivity of 7 uV/mm, display speed of 109mm/sec with a 60Hz  notched filter applied as appropriate. EEG data were recorded continuously and digitally stored. Video monitoring was available and reviewed as appropriate. Description: The posterior dominant  rhythm consists of 8-9 Hz activity of moderate voltage (25-35 uV) seen predominantly in posterior head regions, symmetric and reactive to eye opening and eye closing. Sleep was characterized by vertex waves, sleep spindles (12 to 14 Hz), maximal frontocentral region. EEG showed intermittent generalized 3 to 6 Hz theta-delta slowing. Hyperventilation and photic stimulation were not performed.   ABNORMALITY - Intermittent slow, generalized IMPRESSION: This study is suggestive of mild diffuse encephalopathy. No seizures or epileptiform discharges were seen throughout the recording. Arlin KIDD Shelton   DG Chest Portable 1 View Result Date: 07/18/2023 CLINICAL DATA:  Cerebrovascular accident. EXAM: PORTABLE CHEST 1 VIEW COMPARISON:  07/18/2023 FINDINGS: Prior median sternotomy.  Mitral valve prosthesis. Atherosclerotic calcification of the aortic arch. Moderate enlargement of the cardiopericardial silhouette appears unchanged. Indistinct pulmonary vasculature raising the possibility of pulmonary venous hypertension. No overt edema. No blunting of the costophrenic angles.  IMPRESSION: 1. Moderate enlargement of the cardiopericardial silhouette, with indistinct pulmonary vasculature raising the possibility of pulmonary venous hypertension. No overt edema. 2. Prior median sternotomy and mitral valve prosthesis. Electronically Signed   By: Ryan Salvage M.D.   On: 07/18/2023 15:23   CT HEAD WO CONTRAST ( ) Result Date: 07/18/2023 CLINICAL DATA:  Subdural hematoma.  Seizures. EXAM: CT HEAD WITHOUT CONTRAST TECHNIQUE: Contiguous axial images were obtained from the base of the skull through the vertex without intravenous contrast. RADIATION DOSE REDUCTION: This exam was performed according to the departmental dose-optimization program which includes automated exposure control, adjustment of the mA and/or kV according to patient size and/or use of iterative reconstruction technique. COMPARISON:  Head CT and MRI  07/18/2023 FINDINGS: Brain: A subtle subcentimeter hypodensity involving left parietal cortex corresponds to the acute/subacute infarct on MRI. A large chronic right MCA infarct is again noted. Mildly prominent extra-axial low-density fluid over the right cerebral convexity is unchanged and favored to reflect subarachnoid space enlargement from volume loss. No definite CT correlate is identified for the thin right-sided subdural hematoma which was questioned on today's earlier MRI, and as previously noted this may instead reflect dural thickening (or a thin hematoma which is CT occult). There is a background of mild to moderate chronic small vessel ischemic disease in the cerebral white matter. No interval intracranial hemorrhage, mass, or midline shift is identified. Vascular: Calcified atherosclerosis at the skull base. Skull: No fracture or suspicious lesion. Sinuses/Orbits: Visualized paranasal sinuses and mastoid air cells are clear. Unremarkable orbits. Other: Small posterior right scalp hematoma. IMPRESSION: 1. No definite subdural hematoma by CT. 2. Known small acute/subacute left parietal cortical infarct. 3. Small posterior right scalp hematoma. 4. Large chronic right MCA infarct. Electronically Signed   By: Dasie Hamburg M.D.   On: 07/18/2023 15:13   DG Chest Port 1 View Result Date: 07/18/2023 CLINICAL DATA:  Seizure, trauma EXAM: PORTABLE CHEST 1 VIEW COMPARISON:  01/02/2023 FINDINGS: Mitral valve prosthesis and prior median sternotomy. Atherosclerotic calcification of the aortic arch. Moderate enlargement of the cardiopericardial silhouette, similar to prior. Mildly indistinct pulmonary vasculature, cannot exclude pulmonary venous hypertension. No overt edema or blunting of the costophrenic angles. No airspace opacity identified. No significant bony findings. IMPRESSION: 1. Moderate enlargement of the cardiopericardial silhouette, similar to prior. 2. Mildly indistinct pulmonary vasculature, cannot  exclude pulmonary venous hypertension. 3. Mitral valve prosthesis and prior median sternotomy. Electronically Signed   By: Ryan Salvage M.D.   On: 07/18/2023 13:05   DG Pelvis Portable Result Date: 07/18/2023 CLINICAL DATA:  Seizure.  Fall. EXAM: PORTABLE PELVIS 1-2 VIEWS COMPARISON:  None Available. FINDINGS: Contrast medium noted in the urinary bladder. Moderate degenerative chondral thinning in both hips with mild associated spurring. Overall moderate degenerative hip arthropathy. No SI joint widening. No fracture or acute bony findings identified. IMPRESSION: 1. Moderate degenerative hip arthropathy. No acute bony findings. Electronically Signed   By: Ryan Salvage M.D.   On: 07/18/2023 13:04   MR BRAIN WO CONTRAST Addendum Date: 07/18/2023 ADDENDUM REPORT: 07/18/2023 12:54 ADDENDUM: Impressions #2 and #3 called by telephone at the time of interpretation on 07/18/2023 at 12:54 pm to provider [facility] , who verbally acknowledged these results. Electronically Signed   By: Rockey Childs D.O.   On: 07/18/2023 12:54   Result Date: 07/18/2023 CLINICAL DATA:  Neuro deficit, acute, stroke suspected. Seizure. Fall. EXAM: MRI HEAD WITHOUT CONTRAST TECHNIQUE: Multiplanar, multiecho pulse sequences of the brain and surrounding structures were obtained without intravenous  contrast. COMPARISON:  Non-contrast head CT and CT angiogram head/neck performed earlier today 07/18/2023. FINDINGS: Intermittently motion degraded examination (with up to moderate motion degradation of the acquired sequences). Brain: 5 mm focus of cortical restricted diffusion within the left parietal lobe compatible with an acute/subacute infarct (series 5, image 79) (series 6, image 31). Mild smooth dural thickening versus thin subdural hematoma along the right frontoparietal convexity, measuring up to 2 mm in thickness (for instance as seen on series 9, image 34). No mass effect upon the underlying brain parenchyma. No midline  shift. Large chronic cortical/subcortical right MCA territory infarct again demonstrated (affecting the frontal lobe/insula, temporal lobe and parietal lobe). Mild-to-moderate multifocal T2 FLAIR hyperintense signal abnormality elsewhere within the cerebral white matter, nonspecific but compatible chronic small vessel ischemic disease. Few nonspecific punctate chronic microhemorrhages scattered within the supratentorial brain. Chronic left thalamic lacunar infarct (series 10, image 13). No evidence of an intracranial mass. Vascular: Maintained flow voids within the proximal large arterial vessels. Skull and upper cervical spine: No focal worrisome marrow lesion. Sinuses/Orbits: No mass or acute finding within the imaged orbits. No significant paranasal sinus disease. Other: Right parietal scalp hematoma. Attempts are being made to reach the ordering provider at this time. IMPRESSION: 1. Intermittently motion degraded exam. 2. 5 mm acute/subacute cortical infarct within the left parietal lobe. 3. Possible thin subdural hematoma along the right frontoparietal convexity (versus dural thickening). No associated mass effect. 4. Known large chronic right MCA territory infarct. 5. Background mild-to-moderate cerebral white matter chronic small vessel ischemic disease. 6. Chronic left thalamic lacunar infarct. 7. Right parietal scalp hematoma. Electronically Signed: By: Rockey Childs D.O. On: 07/18/2023 12:46   CT Cervical Spine Wo Contrast Result Date: 07/18/2023 CLINICAL DATA:  Neck trauma, dangerous injury mechanism (Age 54-64y) EXAM: CT CERVICAL SPINE WITHOUT CONTRAST TECHNIQUE: Multidetector CT imaging of the cervical spine was performed without intravenous contrast. Multiplanar CT image reconstructions were also generated. RADIATION DOSE REDUCTION: This exam was performed according to the departmental dose-optimization program which includes automated exposure control, adjustment of the mA and/or kV according to  patient size and/or use of iterative reconstruction technique. COMPARISON:  None Available. FINDINGS: Alignment: Slight degenerative anterolisthesis at C4-5. Slight reversal of the normal cervical lordosis. Skull base and vertebrae: Intact. No osseous lesions are present. There is a prominent vascular foramen present on the left at C4. Soft tissues and spinal canal: No hematoma or significant soft tissue swelling present. Disc levels: Endplate ridging with mild central spinal canal stenosis and mild bilateral neural foraminal stenosis at C5-6. There is also mild central spinal canal stenosis and bilateral neural foraminal stenosis at C6-7. There are moderate facet hypertrophic changes on the right at C2-3. Upper chest: Unremarkable. Other: None. IMPRESSION: 1. Mild-to-moderate cervical spondylosis without evidence of acute traumatic injury. Electronically Signed   By: Evalene Coho M.D.   On: 07/18/2023 11:37   CT ANGIO HEAD NECK W WO CM (CODE STROKE) Result Date: 07/18/2023 CLINICAL DATA:  Provided history: Neuro deficit, acute, stroke suspected. EXAM: CT ANGIOGRAPHY HEAD AND NECK WITH AND WITHOUT CONTRAST TECHNIQUE: Multidetector CT imaging of the head and neck was performed using the standard protocol during bolus administration of intravenous contrast. Multiplanar CT image reconstructions and MIPs were obtained to evaluate the vascular anatomy. Carotid stenosis measurements (when applicable) are obtained utilizing NASCET criteria, using the distal internal carotid diameter as the denominator. RADIATION DOSE REDUCTION: This exam was performed according to the departmental dose-optimization program which includes automated exposure control, adjustment  of the mA and/or kV according to patient size and/or use of iterative reconstruction technique. CONTRAST:  75mL OMNIPAQUE  IOHEXOL  350 MG/ML SOLN COMPARISON:  Head CT 07/18/2023 noncontrast head CT performed earlier today 07/18/2023. FINDINGS: CTA NECK FINDINGS  Aortic arch: Standard aortic branching. Atherosclerotic plaque within the visualized aortic arch and proximal major branch vessels of the neck. No hemodynamically significant innominate or proximal subclavian artery stenosis. Right carotid system: CCA and ICA patent within the neck without stenosis or significant atherosclerotic disease. Left carotid system: CCA and ICA patent within the neck without stenosis. Mild nonstenotic atherosclerotic plaque at the CCA origin. Vertebral arteries: Codominant and patent within the neck without stenosis or significant atherosclerotic disease. Skeleton: Cervical spine findings separately reported on same day cervical spine CT. The patient is edentulous. Other neck: No neck mass or cervical lymphadenopathy. Upper chest: No consolidation within the imaged lung apices. Prior median sternotomy. Review of the MIP images confirms the above findings CTA HEAD FINDINGS Anterior circulation: The intracranial internal carotid arteries are patent. Atherosclerotic plaque within both vessels with no more than mild stenosis. The M1 middle cerebral arteries are patent. No M2 proximal branch occlusion or high-grade proximal stenosis. 1-2 mm posterolaterally projecting vascular protrusion arising from the cavernous right internal carotid artery, which may reflect an aneurysm or the origin of an otherwise poorly delineated branch vessel (series 9, image 215). 1-2 mm inferiorly projecting vascular protrusion arising from the paraclinoid left internal carotid artery, which may reflect an aneurysm or infundibulum (series 9, images 226 and 227). Posterior circulation: The intracranial vertebral arteries are patent. The basilar artery is patent. The posterior cerebral arteries are patent. Posterior communicating arteries are diminutive or absent, bilaterally. Venous sinuses: Within the limitations of contrast timing, no convincing thrombus. Anatomic variants: As described. Review of the MIP images  confirms the above findings No emergent large vessel occlusion identified. These results were communicated to Dr. Voncile at 11:32 amon 6/23/2025by text page via the [facility] messaging system. IMPRESSION: CTA neck: 1. The common carotid and internal carotid arteries are patent within the neck. Non-stenotic atherosclerotic plaque at the left common carotid artery origin. 2. Vertebral arteries patent within the neck without stenosis or significant atherosclerotic disease. 3. Aortic Atherosclerosis (ICD10-I70.0). 4. Cervical spine findings separately reported on same day cervical spine CT. CTA head: 1. No proximal intracranial large vessel occlusion or high-grade proximal arterial stenosis identified. 2. Atherosclerotic plaque within the intracranial internal carotid arteries with no more than mild stenosis. 3. 1-2 mm vascular protrusion arising from the cavernous right internal carotid artery, which may reflect an aneurysm or the origin of an otherwise poorly delineated branch vessel. 4. 1-2 mm vascular protrusion arising from the paraclinoid left internal carotid artery, which may reflect an aneurysm or infundibulum. Electronically Signed   By: Rockey Childs D.O.   On: 07/18/2023 11:34   CT HEAD CODE STROKE WO CONTRAST Result Date: 07/18/2023 CLINICAL DATA:  Code stroke. Neuro deficit, acute, stroke suspected. EXAM: CT HEAD WITHOUT CONTRAST TECHNIQUE: Contiguous axial images were obtained from the base of the skull through the vertex without intravenous contrast. RADIATION DOSE REDUCTION: This exam was performed according to the departmental dose-optimization program which includes automated exposure control, adjustment of the mA and/or kV according to patient size and/or use of iterative reconstruction technique. COMPARISON:  Head CT 09/04/2010. 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 Background mild patchy and  ill-defined hypoattenuation within the cerebral white matter, nonspecific but compatible chronic small vessel ischemic disease. Chronic lacunar infarct within the left thalamus, unchanged. There is no acute intracranial hemorrhage. No acute demarcated cortical infarct. No extra-axial fluid collection. No evidence of an intracranial mass. No midline shift. Vascular: No hyperdense vessel.  Atherosclerotic calcifications. Skull: No calvarial fracture or aggressive osseous lesion. Sinuses/Orbits: No mass or acute finding within the imaged orbits. No significant paranasal sinus disease at the imaged levels. ASPECTS [facility] Stroke Program Early CT Score) - Ganglionic level infarction (caudate, lentiform nuclei, internal capsule, insula, M1-M3 cortex): 7 - Supraganglionic infarction (M4-M6 cortex): 3 Total score (0-10 with 10 being normal): 10 (when discounting a chronic right MCA territory infarct). No evidence of an acute intracranial abnormality. These results were communicated to Dr. Voncile at 11:15 amon 6/23/2025by text page via the [facility] messaging system. IMPRESSION: 1. No evidence of an acute intracranial abnormality. 2. Known large chronic right MCA territory infarct. 3. Mild background cerebral white matter chronic small vessel ischemic disease, progressed from the prior head CT of 09/04/2010. 4. Chronic left thalamic lacunar infarct, unchanged. Electronically Signed   By: Rockey Childs D.O.   On: 07/18/2023 11:15    Microbiology: Results for orders placed or performed during the hospital encounter of 07/22/23  Surgical pcr screen     Status: None   Collection Time: 07/25/23  4:14 PM   Specimen: Nasal Mucosa; Nasal Swab  Result Value Ref Range Status   MRSA, PCR NEGATIVE NEGATIVE Final   Staphylococcus aureus NEGATIVE NEGATIVE Final    Comment: (NOTE) The Xpert SA Assay (FDA approved for NASAL specimens in patients 47 years of age and older), is one  component of a comprehensive surveillance program. It is not intended to diagnose infection nor to guide or monitor treatment. Performed at [facility], [address], [place], [place] [address]     Labs: CBC: Recent Labs  Lab 08/04/23 0517 08/05/23 0430 08/06/23 0423 08/07/23 0628 08/08/23 0454  WBC 12.8* 13.5* 11.3* 10.1 10.6*  HGB 10.2* 10.2* 10.3* 10.3* 10.6*  HCT 31.4* 31.5* 31.5* 32.0* 32.6*  MCV 94.0 94.9 94.3 94.1 94.8  PLT 418* 415* 419* 351 365   Basic Metabolic Panel: Recent Labs  Lab 08/03/23 0832  NA 131*  K 4.4  CL 91*  CO2 28  GLUCOSE 179*  BUN 13  CREATININE 0.83  CALCIUM  10.5*   Liver Function Tests: No results for input(s): AST, ALT, ALKPHOS, BILITOT, PROT, ALBUMIN in the last 168 hours. CBG: No results for input(s): GLUCAP in the last 168 hours.  Discharge time spent: 45 minutes.  Signed: Deliliah Room, MD [facility] 08/08/2023

## 2023-08-08 NOTE — Plan of Care (Signed)
  Problem: Education: Goal: Knowledge of General Education information will improve Description: Including pain rating scale, medication(s)/side effects and non-pharmacologic comfort measures Outcome: Adequate for Discharge   Problem: Health Behavior/Discharge Planning: Goal: Ability to manage health-related needs will improve Outcome: Adequate for Discharge   Problem: Clinical Measurements: Goal: Ability to maintain clinical measurements within normal limits will improve Outcome: Adequate for Discharge Goal: Will remain free from infection Outcome: Adequate for Discharge Goal: Diagnostic test results will improve Outcome: Adequate for Discharge Goal: Respiratory complications will improve Outcome: Adequate for Discharge Goal: Cardiovascular complication will be avoided Outcome: Adequate for Discharge   Problem: Elimination: Goal: Will not experience complications related to bowel motility Outcome: Adequate for Discharge Goal: Will not experience complications related to urinary retention Outcome: Adequate for Discharge   Problem: Pain Managment: Goal: General experience of comfort will improve and/or be controlled Outcome: Adequate for Discharge   Problem: Safety: Goal: Ability to remain free from injury will improve Outcome: Adequate for Discharge   Problem: Skin Integrity: Goal: Risk for impaired skin integrity will decrease Outcome: Adequate for Discharge   Problem: Education: Goal: Understanding of CV disease, CV risk reduction, and recovery process will improve Outcome: Adequate for Discharge Goal: Individualized Educational Video(s) Outcome: Adequate for Discharge   Problem: Activity: Goal: Ability to return to baseline activity level will improve Outcome: Adequate for Discharge   Problem: Cardiovascular: Goal: Ability to achieve and maintain adequate cardiovascular perfusion will improve Outcome: Adequate for Discharge Goal: Vascular access site(s) Level  0-1 will be maintained Outcome: Adequate for Discharge   Problem: Health Behavior/Discharge Planning: Goal: Ability to safely manage health-related needs after discharge will improve Outcome: Adequate for Discharge

## 2023-08-09 ENCOUNTER — Telehealth: Payer: Self-pay

## 2023-08-09 ENCOUNTER — Other Ambulatory Visit: Payer: Self-pay | Admitting: Cardiovascular Disease

## 2023-08-09 DIAGNOSIS — I3489 Other nonrheumatic mitral valve disorders: Secondary | ICD-10-CM

## 2023-08-09 DIAGNOSIS — I10 Essential (primary) hypertension: Secondary | ICD-10-CM

## 2023-08-09 NOTE — Transitions of Care (Post Inpatient/ED Visit) (Signed)
 08/09/2023  Name: Natasha Davis MRN: [id number] DOB: 10-28-1955  Today's TOC FU Call Status: Today's TOC FU Call Status:: Successful TOC FU Call Completed TOC FU Call Complete Date: 08/09/23 Patient's Name and Date of Birth confirmed.  Transition Care Management Follow-up Telephone Call Date of Discharge: 08/08/23 Discharge Facility: Natasha Davis [facility]) Type of Discharge: Inpatient Admission Primary Inpatient Discharge Diagnosis:: Sezures How have you been since you were released from the hospital?: Better Any questions or concerns?: No  Items Reviewed: Did you receive and understand the discharge instructions provided?: Yes Medications obtained,verified, and reconciled?: Yes (Medications Reviewed) Any new allergies since your discharge?: No Dietary orders reviewed?: Yes Type of Diet Ordered:: Low Sodium Heart Healthy Do you have support at home?: Yes People in Home [RPT]: grandchild(ren) Name of Support/Comfort Primary Source: Natasha Davis  Medications Reviewed Today: Medications Reviewed Today     Reviewed by Natasha Reusing, RN (Case Manager) on 08/09/23 at 1408  Med List Status: <None>   Medication Order Taking? Sig Documenting Provider Last Dose Status Informant  acetaminophen  (TYLENOL ) 650 MG CR tablet [id number] No Take 1,300 mg by mouth daily as needed for pain. Pain [provider] 07/22/2023  3:00 PM Active Self, Pharmacy Records, Friend  atorvastatin  (LIPITOR) 20 MG tablet [id number] No TAKE 1 TABLET BY MOUTH EVERY DAY  Patient taking differently: Take 20 mg by mouth at bedtime.   Nahser, Aleene PARAS, MD 07/21/2023 Active Self, Pharmacy Records, Friend  levETIRAcetam  (KEPPRA ) 750 MG tablet [id number]  Take 1 tablet (750 mg total) by mouth 2 (two) times daily. Rashid, Farhan, MD  Active   losartan  (COZAAR ) 50 MG tablet [id number]  Take 1 tablet (50 mg total) by mouth daily. Rashid, Farhan, MD  Active   metoprolol  succinate (TOPROL -XL) 25 MG 24 hr tablet [id number]   Take 1 tablet (25 mg total) by mouth daily. Natasha Antu, MD  Active   Multiple Vitamins-Minerals (CENTRUM SILVER ADULT 50+) TABS [id number] No Take 1 tablet by mouth in the morning. [provider] 07/22/2023 Morning Active Self, Pharmacy Records, Friend  omeprazole  (PRILOSEC) 20 MG capsule [id number] No TAKE 2 CAPSULES BY MOUTH EVERY DAY  Patient taking differently: Take 40 mg by mouth daily as needed (reflux).   Nahser, Aleene PARAS, MD Past Month Active Self, Pharmacy Records, Friend  spironolactone  (ALDACTONE ) 25 MG tablet [id number] No Take 1 tablet (25 mg total) by mouth daily. Nahser, Aleene PARAS, MD 07/22/2023 Morning Active Self, Pharmacy Records, Friend  triamterene -hydrochlorothiazide  (MAXZIDE -25) 37.5-25 MG tablet [id number] No Take 1 tablet by mouth daily. [provider] 07/22/2023 Morning Active Self, Pharmacy Records, Friend  warfarin (COUMADIN ) 5 MG tablet [id number] No TAKE 1 TABLET BY MOUTH DAILY OR AS DIRECTED BY COUMADIN  CLINIC  Patient taking differently: Take 5 mg by mouth daily after supper. TAKE 1 TABLET BY MOUTH DAILY OR AS DIRECTED BY COUMADIN  CLINIC   Nahser, Aleene PARAS, MD 07/22/2023 Evening Active Self, Pharmacy Records, Friend            Home Care and Equipment/Supplies: Were Home Health Services Ordered?: Yes Name of Home Health Agency:: [facility] Has Agency set up a time to come to your home?: No EMR reviewed for Home Health Orders: Orders present/patient has not received call (refer to CM for follow-up) Any new equipment or medical supplies ordered?: No  Functional Questionnaire: Do you need assistance with bathing/showering or dressing?: No Do you need assistance with meal preparation?: No Do you need assistance with eating?: No Do you have difficulty maintaining  continence: No Do you need assistance with getting out of bed/getting out of a chair/moving?: No Do you have difficulty managing or taking your medications?: No  Follow up appointments  reviewed: PCP Follow-up appointment confirmed?: No (The patient needs to schedule her appointment with [facility] [address] [place]. [facility]ides cannot schedule.) MD Provider Line [phone] Given: No Specialist Hospital Follow-up appointment confirmed?: No Follow-Up Specialty Provider:: Needs to schedule with the Neurosurgeon Reason Specialist Follow-Up Not Confirmed: Patient has Specialist Provider Number and will Call for Appointment Do you need transportation to your follow-up appointment?: No Do you understand care options if your condition(s) worsen?: Yes-patient verbalized understanding  SDOH Interventions Today    Flowsheet Row Most Recent Value  SDOH Interventions   Food Insecurity Interventions Intervention Not Indicated  Housing Interventions Intervention Not Indicated  Transportation Interventions Intervention Not Indicated  Utilities Interventions Intervention Not Indicated    Goals      VBCI Transitions of Care (TOC) Care Plan     Problems:  Recent Hospitalization for treatment of CVA, Sezures, Subdural Heamtoma The patient's PCP provider is with [facility] and the [facility] cannot schedule the follow up and that patient states her daughter will call  Goal:  Over the next 30 days, the patient will not experience hospital readmission  Interventions:   Falls Interventions: Reviewed medications and discussed potential side effects of medications such as dizziness and frequent urination Advised patient of importance of notifying provider of falls Provided patient information for fall alert systems   Stroke: Reviewed Importance of taking all medications as prescribed Reviewed Importance of attending all scheduled provider appointments Advised to report any changes in symptoms or exercise tolerance Assessed for signs and symptoms of stroke Reviewed referrals to home health Assessed for management of bladder and/or bowel incontinence Assessed for  cognitive impairment Assessed for fall status and safety in the home The patient had a subdural hematoma with surgery to repair the bleed. She will schedule a follow up appointment with the provider at [facility] Follow up with INR clinic as directed. Currently every three weeks.   Patient Self Care Activities:  Attend all scheduled provider appointments Call pharmacy for medication refills 3-7 days in advance of running out of medications Call provider office for new concerns or questions  Notify RN Care Manager of [facility] call rescheduling needs Participate in Transition of Care Program/Attend [facility] scheduled calls Perform all self care activities independently  Take medications as prescribed    Plan:  Telephone follow up appointment with care management team member scheduled for:  Tuesday July 22nd at 1:00pm        Goals Addressed             This Visit's Progress    VBCI Transitions of Care (TOC) Care Plan       Problems:  Recent Hospitalization for treatment of CVA, Sezures, Subdural Heamtoma The patient's PCP provider is with [facility] and the [facility] cannot schedule the follow up and that patient states her daughter will call  Goal:  Over the next 30 days, the patient will not experience hospital readmission  Interventions:   Falls Interventions: Reviewed medications and discussed potential side effects of medications such as dizziness and frequent urination Advised patient of importance of notifying provider of falls Provided patient information for fall alert systems   Stroke: Reviewed Importance of taking all medications as prescribed Reviewed Importance of attending all scheduled provider appointments Advised to report any changes in symptoms or exercise  tolerance Assessed for signs and symptoms of stroke Reviewed referrals to home health Assessed for management of bladder and/or bowel incontinence Assessed for cognitive impairment Assessed  for fall status and safety in the home The patient had a subdural hematoma with surgery to repair the bleed. She will schedule a follow up appointment with the provider at [facility] Follow up with INR clinic as directed. Currently every three weeks.   Patient Self Care Activities:  Attend all scheduled provider appointments Call pharmacy for medication refills 3-7 days in advance of running out of medications Call provider office for new concerns or questions  Notify RN Care Manager of [facility] call rescheduling needs Participate in Transition of Care Program/Attend [place] scheduled calls Perform all self care activities independently  Take medications as prescribed    Plan:  Telephone follow up appointment with care management team member scheduled for:  Tuesday July 22nd at 1:00pm      Medford Balboa, BSN, RN [place]  VBCI - [employer] [facility] RN Care Manager [phone]

## 2023-08-09 NOTE — Patient Instructions (Signed)
 Visit Information  Thank you for taking time to visit with me today. Please don't hesitate to contact me if I can be of assistance to you before our next scheduled telephone appointment.  Our next appointment is by telephone on Tuesday July 22nd at 1:00pm  Following is a copy of your care plan:   Goals Addressed             This Visit's Progress    VBCI Transitions of Care (TOC) Care Plan       Problems:  Recent Hospitalization for treatment of CVA, Sezures, Subdural Heamtoma The patient's PCP provider is with [facility] and the [facility] cannot schedule the follow up and that patient states her daughter will call  Goal:  Over the next 30 days, the patient will not experience hospital readmission  Interventions:   Falls Interventions: Reviewed medications and discussed potential side effects of medications such as dizziness and frequent urination Advised patient of importance of notifying provider of falls Provided patient information for fall alert systems   Stroke: Reviewed Importance of taking all medications as prescribed Reviewed Importance of attending all scheduled provider appointments Advised to report any changes in symptoms or exercise tolerance Assessed for signs and symptoms of stroke Reviewed referrals to home health Assessed for management of bladder and/or bowel incontinence Assessed for cognitive impairment Assessed for fall status and safety in the home The patient had a subdural hematoma with surgery to repair the bleed. She will schedule a follow up appointment with the provider at [facility] Follow up with INR clinic as directed. Currently every three weeks.   Patient Self Care Activities:  Attend all scheduled provider appointments Call pharmacy for medication refills 3-7 days in advance of running out of medications Call provider office for new concerns or questions  Notify RN Care Manager of [facility] call rescheduling needs Participate  in Transition of Care Program/Attend [facility] scheduled calls Perform all self care activities independently  Take medications as prescribed    Plan:  Telephone follow up appointment with care management team member scheduled for:  Tuesday July 22nd at 1:00pm        Patient verbalizes understanding of instructions and care plan provided today and agrees to view in MyChart. Active MyChart status and patient understanding of how to access instructions and care plan via MyChart confirmed with patient.     The patient has been provided with contact information for the care management team and has been advised to call with any health related questions or concerns.   Please call the care guide team at [phone] if you need to cancel or reschedule your appointment.   Please call the Suicide and Crisis Lifeline: 988 call the [place]  National Suicide Prevention Lifeline: 3145129139 or TTY: 3362804671 TTY [phone]) to talk to a trained counselor if you are experiencing a Mental Health or Behavioral Health Crisis or need someone to talk to.  Medford Balboa, BSN, RN [place]  VBCI - [employer] [facility] RN Care Manager [phone]

## 2023-08-12 DIAGNOSIS — Z9181 History of falling: Secondary | ICD-10-CM | POA: Diagnosis not present

## 2023-08-12 DIAGNOSIS — K219 Gastro-esophageal reflux disease without esophagitis: Secondary | ICD-10-CM | POA: Diagnosis not present

## 2023-08-12 DIAGNOSIS — E785 Hyperlipidemia, unspecified: Secondary | ICD-10-CM | POA: Diagnosis not present

## 2023-08-12 DIAGNOSIS — E871 Hypo-osmolality and hyponatremia: Secondary | ICD-10-CM | POA: Diagnosis not present

## 2023-08-12 DIAGNOSIS — I4819 Other persistent atrial fibrillation: Secondary | ICD-10-CM | POA: Diagnosis not present

## 2023-08-12 DIAGNOSIS — Z954 Presence of other heart-valve replacement: Secondary | ICD-10-CM | POA: Diagnosis not present

## 2023-08-12 DIAGNOSIS — S065X0D Traumatic subdural hemorrhage without loss of consciousness, subsequent encounter: Secondary | ICD-10-CM | POA: Diagnosis not present

## 2023-08-12 DIAGNOSIS — Z8673 Personal history of transient ischemic attack (TIA), and cerebral infarction without residual deficits: Secondary | ICD-10-CM | POA: Diagnosis not present

## 2023-08-12 DIAGNOSIS — I110 Hypertensive heart disease with heart failure: Secondary | ICD-10-CM | POA: Diagnosis not present

## 2023-08-12 DIAGNOSIS — Z7901 Long term (current) use of anticoagulants: Secondary | ICD-10-CM | POA: Diagnosis not present

## 2023-08-12 DIAGNOSIS — I5042 Chronic combined systolic (congestive) and diastolic (congestive) heart failure: Secondary | ICD-10-CM | POA: Diagnosis not present

## 2023-08-12 DIAGNOSIS — D72829 Elevated white blood cell count, unspecified: Secondary | ICD-10-CM | POA: Diagnosis not present

## 2023-08-12 DIAGNOSIS — D649 Anemia, unspecified: Secondary | ICD-10-CM | POA: Diagnosis not present

## 2023-08-15 ENCOUNTER — Ambulatory Visit (INDEPENDENT_AMBULATORY_CARE_PROVIDER_SITE_OTHER): Admitting: Neurology

## 2023-08-15 ENCOUNTER — Encounter: Payer: Self-pay | Admitting: Neurology

## 2023-08-15 VITALS — BP 129/73 | HR 88 | Ht 59.0 in | Wt 206.0 lb

## 2023-08-15 DIAGNOSIS — G40109 Localization-related (focal) (partial) symptomatic epilepsy and epileptic syndromes with simple partial seizures, not intractable, without status epilepticus: Secondary | ICD-10-CM | POA: Diagnosis not present

## 2023-08-15 DIAGNOSIS — S065XAA Traumatic subdural hemorrhage with loss of consciousness status unknown, initial encounter: Secondary | ICD-10-CM | POA: Diagnosis not present

## 2023-08-15 MED ORDER — LEVETIRACETAM 750 MG PO TABS: 750.0000 mg | ORAL_TABLET | Freq: Two times a day (BID) | ORAL | 3 refills | Status: AC

## 2023-08-15 NOTE — Progress Notes (Unsigned)
 NEUROLOGY CONSULTATION NOTE  Natasha Davis MRN: [id number] DOB: [birth year]  Referring provider: Dr. Lonni Davis Primary care provider: Dr. Norleen Davis  Reason for consult:  seizure  Dear Dr Davis:  Thank you for your kind referral of Natasha Davis for consultation of the above symptoms. Although her Davis is well known to you, please allow me to reiterate it for the purpose of our medical record. She is accompanied by her Natasha Davis Natasha Davis today. Records and images were personally reviewed where available.   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 She was admitted last month after having a seizure while cooking on 07/18/23, in the ER she had initial difficulty speaking with moderate to severe dysarthria, and mild left facial and arm weakness. She recalls feeling funny, she tried to go to the bathroom then woke up on the floor. Her Natasha Davis reported she lost consciousness and hit her head on the side of the wall. It looked like her tongue was hanging out, she was unresponsive. She then woke up and went to the bathroom and fell again, unable to get up. She had urinary incontinence. No jerking reported. She reported being seizure-free for greater than 10 years until then. They recall a seizure in 2011 when she stopped Keppra , she had been taking 500mg  daily since then. In the ER, NIHSS was 8. MRI brain without contrast showed a punctate subacute ischemic infarct in the left parietal region (likely incidental) and a sliver of subdural hematoma versus dural thickening over the right frontoparietal convexity (dural thickening favored). Overnight EEG showed intermittent generalized slowing. CTA no LVO. She was  discharged home on Levetiracetam  500mg  BID. She was back in the hospital on 6/27 for for progressive headache for a day. She reported headache with vomiting in the ER. BP was 171/78. Head CT showed acute bilateral subdural hematoma with hemorrhagic tracking along the falx and tentorial leaflets, 5mm rightward midline shift. Neurosurgery recommended holding Lovenox  and Coumadin . She underwent embolization of the right and left middle meningeal artery on 07/26/23. On 6/28, she had 2 witnessed seizures described by family as abnormal facial and eyelid movement with no loss of awareness, maintained conversation. No staring, she started calling her Natasha Davis, repeating her name, her left eye and left side of the mouth was twitching for 1-3 minutes. Her Natasha Davis reports she had 3 episodes. She felt like she was drifting but could talk and understand. Overnight EEG showed generalized slowing. Levetiracetam  increased to 750mg  BID. Her last head CT on 7/3 showed interval bilateral middle meningeal artery embolization, decreased left SDH (4mm), right SDH (10mm) more lobulated with mixesd density, superimposed chronic right hemisphere encephalomalacia. She was discharged home a week ago.   Taking meds by herself, always on point with her med  RF: brother had epilepsy,  ROS: mild HAs but nothing like before; let side weaker; no focal sx No neck/back pain; no bowel/bladder Lives with Natasha Davis; no alcohol Sleep interrupted sleep every 2 hrs; naps all day Mood alright, gets frsutrated quick, for a while now Does not drive   Shallow left nlf +1, 3/3, 5/5 Dec ffm on left Dec pin on left f/a/l   Seizure symptoms: The patient denies any olfactory/gustatory hallucinations, deja vu, rising epigastric sensation, focal numbness/tingling/weakness, myoclonic jerks.  Epilepsy Risk Factors:  *** had a normal birth and early development.  There is no Davis of febrile convulsions, CNS infections such as  meningitis/encephalitis, significant traumatic brain injury, neurosurgical procedures, or family Davis of seizures.  Prior AEDs: Laboratory Data:  EEGs: MRI:   PAST MEDICAL Davis: Past Medical Davis:  Diagnosis Date   Chronic anticoagulation    Claudication (HCC)    Davis of CVA (cerebrovascular accident)    Hypertension    Mitral stenosis    Seizure (HCC)    HX    PAST SURGICAL Davis: Past Surgical Davis:  Procedure Laterality Date   CARDIAC CATHETERIZATION  06/01/96   NORMAL LEFT VENTRICULAR SYSTOLIC FUNCTION. EF 60%   CESAREAN SECTION     X2   IR ANGIO EXTERNAL CAROTID SEL EXT CAROTID BILAT MOD SED  07/26/2023   IR ANGIO INTRA EXTRACRAN SEL INTERNAL CAROTID BILAT MOD SED  07/26/2023   IR NEURO EACH ADD'L AFTER BASIC UNI LEFT (MS)  07/26/2023   IR NEURO EACH ADD'L AFTER BASIC UNI RIGHT (MS)  07/26/2023   IR TRANSCATH/EMBOLIZ  07/26/2023   MITRAL VALVE REPLACEMENT     RADIOLOGY WITH ANESTHESIA N/A 07/26/2023   Procedure: RADIOLOGY WITH ANESTHESIA;  Surgeon: Lanis Pupa, MD;  [facility];  Service: Radiology;  Laterality: N/A;    MEDICATIONS: Current Outpatient Medications on File Prior to Visit  Medication Sig Dispense Refill   acetaminophen  (TYLENOL ) 650 MG CR tablet Take 1,300 mg by mouth daily as needed for pain. Pain     atorvastatin  (LIPITOR) 20 MG tablet TAKE 1 TABLET BY MOUTH EVERY DAY 90 tablet 2   levETIRAcetam  (KEPPRA ) 750 MG tablet Take 1 tablet (750 mg total) by mouth 2 (two) times daily. 30 tablet 0   losartan  (COZAAR ) 50 MG tablet Take 1 tablet (50 mg total) by mouth daily. 30 tablet 0   metoprolol  succinate (TOPROL -XL) 25 MG 24 hr tablet Take 1 tablet (25 mg total) by mouth daily. 30 tablet 0   Multiple Vitamins-Minerals (CENTRUM SILVER ADULT 50+) TABS Take 1 tablet by mouth in the morning.     omeprazole  (PRILOSEC) 20 MG capsule TAKE 2 CAPSULES BY MOUTH EVERY DAY 180 capsule 2   triamterene -hydrochlorothiazide  (MAXZIDE -25) 37.5-25 MG tablet  Take 1 tablet by mouth daily.     warfarin (COUMADIN ) 5 MG tablet TAKE 1 TABLET BY MOUTH DAILY OR AS DIRECTED BY COUMADIN  CLINIC 100 tablet 1   spironolactone  (ALDACTONE ) 25 MG tablet Take 1 tablet (25 mg total) by mouth daily. (Patient not taking: Reported on 08/15/2023) 90 tablet 3   No current facility-administered medications on file prior to visit.    ALLERGIES: Allergies  Allergen Reactions   Penicillins Other (See Comments)    Couldn't walk    FAMILY Davis: Family Davis  Problem Relation Age of Onset   Hypertension Mother    Heart disease Father    Hypertension Father    Heart attack Father    Stroke Brother     SOCIAL Davis: Social Davis   Socioeconomic Davis   Marital status: Single    Spouse name: Not on file   Number of children: Not on file   Years of education: Not on file   Highest education level: Not on file  Occupational Davis   Not on file  Tobacco Use   Smoking status: Former    Current packs/day: 0.00    Types: Cigarettes    Quit date: 01/25/2006    Years since quitting: 17.5  Smokeless tobacco: Never  Substance and Sexual Activity   Alcohol use: Not Currently   Drug use: Not Currently   Sexual activity: Never  Other Topics Concern   Not on file  Social Davis Narrative   ** Merged Davis Encounter **       Social Drivers of Health   Financial Resource Strain: Not on file  Food Insecurity: No Food Insecurity (08/09/2023)   Hunger Vital Sign    Worried About Running Out of Food in the Last Year: Never true    Ran Out of Food in the Last Year: Never true  Transportation Needs: No Transportation Needs (08/09/2023)   PRAPARE - Administrator, Civil Service (Medical): No    Lack of Transportation (Non-Medical): No  Physical Activity: Not on file  Stress: Not on file  Social Connections: Moderately Integrated (07/23/2023)   Social Connection and Isolation Panel    Frequency of Communication with Friends and Family:  More than three times a week    Frequency of Social Gatherings with Friends and Family: More than three times a week    Attends Religious Services: More than 4 times per year    Active Member of [employer] or Organizations: Yes    Attends Engineer, structural: More than 4 times per year    Marital Status: Divorced  Intimate Partner Violence: Not At Risk (08/09/2023)   Humiliation, Afraid, Rape, and Kick questionnaire    Fear of Current or Ex-Partner: No    Emotionally Abused: No    Physically Abused: No    Sexually Abused: No     PHYSICAL EXAM: Vitals:   08/15/23 1421  BP: 129/73  Pulse: 88  SpO2: 95%   Davis: No acute distress Head:  Normocephalic/atraumatic Skin/Extremities: No rash, no edema Neurological Exam: Mental status: alert and oriented to person, place, and time, no dysarthria or aphasia, Fund of knowledge is appropriate.  Recent and remote memory are intact.  Attention and concentration are normal.    Able to name objects and repeat phrases. Cranial nerves: CN I: not tested CN II: pupils equal, round and reactive to light, visual fields intact CN III, IV, VI:  full range of motion, no nystagmus, no ptosis CN V: facial sensation intact CN VII: upper and lower face symmetric CN VIII: hearing intact to conversation Bulk & Tone: normal, no fasciculations. Motor: 5/5 throughout with no pronator drift. Sensation: intact to light touch, cold, pin, vibration and joint position sense.  No extinction to double simultaneous stimulation.  Romberg test *** Deep Tendon Reflexes: +2 throughout, no ankle clonus Plantar responses: downgoing bilaterally Cerebellar: no incoordination on finger to nose, heel to shin. No dysdiadochokinesia Gait: narrow-based and steady, able to tandem walk adequately. Tremor: ***   IMPRESSION: This is a *** year old ***-handed *** with a Davis of ***.  [place] driving laws were discussed with the patient, and *** knows to stop driving after a  seizure, until 6 months seizure-free.    The duration of this appointment visit was *** minutes of face-to-face time with the patient.  Greater than 50% of this time was spent in counseling, explanation of diagnosis, planning of further management, and coordination of care.  Thank you for allowing me to participate in the care of this patient. Please do not hesitate to call for any questions or concerns.   Darice Shivers, M.D.  CC: ***

## 2023-08-15 NOTE — Patient Instructions (Signed)
 Good to meet you! Continue Levetiracetam  (Keppra ) 750mg  twice a day. Continue working on improving sleep hygiene. Follow-up with Dr. Lanis for the subdural hematoma. Follow-up in 6 months, call for any changes.    Seizure Precautions: 1. If medication has been prescribed for you to prevent seizures, take it exactly as directed.  Do not stop taking the medicine without talking to your doctor first, even if you have not had a seizure in a long time.   2. Avoid activities in which a seizure would cause danger to yourself or to others.  Don't operate dangerous machinery, swim alone, or climb in high or dangerous places, such as on ladders, roofs, or girders.  Do not drive unless your doctor says you may.  3. If you have any warning that you may have a seizure, lay down in a safe place where you can't hurt yourself.    4.  No driving for 6 months from last seizure, as per [place]  state law.   Please refer to the following link on the Epilepsy Foundation of America's website for more information: http://www.epilepsyfoundation.org/answerplace/Social/driving/drivingu.cfm   5.  Maintain good sleep hygiene.   6.  Contact your doctor if you have any problems that may be related to the medicine you are taking.  7.  Call 911 and bring the patient back to the ED if:        A.  The seizure lasts longer than 5 minutes.       B.  The patient doesn't awaken shortly after the seizure  C.  The patient has new problems such as difficulty seeing, speaking or moving  D.  The patient was injured during the seizure  E.  The patient has a temperature over 102 F (39C)  F.  The patient vomited and now is having trouble breathing

## 2023-08-16 ENCOUNTER — Other Ambulatory Visit: Payer: Self-pay

## 2023-08-17 ENCOUNTER — Ambulatory Visit: Attending: Cardiovascular Disease

## 2023-08-17 DIAGNOSIS — Z5181 Encounter for therapeutic drug level monitoring: Secondary | ICD-10-CM | POA: Diagnosis not present

## 2023-08-17 DIAGNOSIS — Z952 Presence of prosthetic heart valve: Secondary | ICD-10-CM

## 2023-08-17 LAB — POCT INR: INR: 4.3 — AB (ref 2.0–3.0)

## 2023-08-17 NOTE — Patient Instructions (Signed)
 Description   HOLD today's dose and then START taking 1 tablet daily EXCEPT 1/2 tablet on Sundays.  Recheck in 1 week.  Prefers Fridays since she doesn't drive. Stay consistent with greens each week (2 times per week)  Call us  # [phone]  Coumadin  Clinic with any medication changes or concerns.

## 2023-08-17 NOTE — Progress Notes (Addendum)
 INR 4.3; Please see anticoagulation encounter

## 2023-08-18 ENCOUNTER — Other Ambulatory Visit: Payer: Self-pay

## 2023-08-18 DIAGNOSIS — Z954 Presence of other heart-valve replacement: Secondary | ICD-10-CM | POA: Diagnosis not present

## 2023-08-18 DIAGNOSIS — D72829 Elevated white blood cell count, unspecified: Secondary | ICD-10-CM | POA: Diagnosis not present

## 2023-08-18 DIAGNOSIS — K219 Gastro-esophageal reflux disease without esophagitis: Secondary | ICD-10-CM | POA: Diagnosis not present

## 2023-08-18 DIAGNOSIS — I110 Hypertensive heart disease with heart failure: Secondary | ICD-10-CM | POA: Diagnosis not present

## 2023-08-18 DIAGNOSIS — E785 Hyperlipidemia, unspecified: Secondary | ICD-10-CM | POA: Diagnosis not present

## 2023-08-18 DIAGNOSIS — Z8673 Personal history of transient ischemic attack (TIA), and cerebral infarction without residual deficits: Secondary | ICD-10-CM | POA: Diagnosis not present

## 2023-08-18 DIAGNOSIS — D649 Anemia, unspecified: Secondary | ICD-10-CM | POA: Diagnosis not present

## 2023-08-18 DIAGNOSIS — Z7901 Long term (current) use of anticoagulants: Secondary | ICD-10-CM | POA: Diagnosis not present

## 2023-08-18 DIAGNOSIS — I4819 Other persistent atrial fibrillation: Secondary | ICD-10-CM | POA: Diagnosis not present

## 2023-08-18 DIAGNOSIS — Z9181 History of falling: Secondary | ICD-10-CM | POA: Diagnosis not present

## 2023-08-18 DIAGNOSIS — S065X0D Traumatic subdural hemorrhage without loss of consciousness, subsequent encounter: Secondary | ICD-10-CM | POA: Diagnosis not present

## 2023-08-18 DIAGNOSIS — E871 Hypo-osmolality and hyponatremia: Secondary | ICD-10-CM | POA: Diagnosis not present

## 2023-08-18 DIAGNOSIS — I5042 Chronic combined systolic (congestive) and diastolic (congestive) heart failure: Secondary | ICD-10-CM | POA: Diagnosis not present

## 2023-08-18 NOTE — Patient Instructions (Signed)
 Visit Information  Thank you for taking time to visit with me today. Please don't hesitate to contact me if I can be of assistance to you before our next scheduled telephone appointment.  Our next appointment is by telephone on Thursday July 31st at 2pm  Following is a copy of your care plan:   Goals Addressed             This Visit's Progress    [facility]BCI Transitions of Care (TOC) Care Plan       Problems: (reviewed 08/18/23) Recent Hospitalization for treatment of CVA, Sezures, Subdural Heamtoma The patient's PCP provider is with [facility] and the Care Guides cannot schedule the follow up and that patient states her daughter will call  Goal: (reviewed 08/18/23) Over the next 30 days, the patient will not experience hospital readmission  Interventions: (reviewed 08/18/23)  Falls Interventions: Reviewed medications and discussed potential side effects of medications such as dizziness and frequent urination Advised patient of importance of notifying provider of falls Provided patient information for fall alert systems Utilize assistive device as needed Participate in HHPT  Stroke: Reviewed Importance of taking all medications as prescribed Reviewed Importance of attending all scheduled provider appointments Advised to report any changes in symptoms or exercise tolerance Assessed for signs and symptoms of stroke Reviewed referrals to home health Assessed for management of bladder and/or bowel incontinence Assessed for cognitive impairment Assessed for fall status and safety in the home The patient had a subdural hematoma with surgery to repair the bleed. She will schedule a follow up appointment with the provider at [facility] Follow up with INR clinic as directed. Currently every three weeks.   Patient Self Care Activities:  Attend all scheduled provider appointments Call pharmacy for medication refills 3-7 days in advance of running out of medications Call provider  office for new concerns or questions  Notify RN Care Manager of [facility] call rescheduling needs Participate in Transition of Care Program/Attend [facility] scheduled calls Perform all self care activities independently  Take medications as prescribed    Plan:  Telephone follow up appointment with care management team member scheduled for:  Thursday July 31st at 2:00pm        Patient verbalizes understanding of instructions and care plan provided today and agrees to view in MyChart. Active MyChart status and patient understanding of how to access instructions and care plan via MyChart confirmed with patient.     The patient has been provided with contact information for the care management team and has been advised to call with any health related questions or concerns.   Please call the care guide team at [phone] if you need to cancel or reschedule your appointment.   Please call the Suicide and Crisis Lifeline: 988 call the [place]  National Suicide Prevention Lifeline: 360-215-6416 or TTY: 431-269-9055 TTY (662)164-3674) to talk to a trained counselor if you are experiencing a Mental Health or Behavioral Health Crisis or need someone to talk to.  Medford Balboa, BSN, RN Conception  [facility] - [employer] [facility] RN Care Manager [phone]

## 2023-08-24 ENCOUNTER — Other Ambulatory Visit: Payer: Self-pay | Admitting: Neurosurgery

## 2023-08-24 ENCOUNTER — Other Ambulatory Visit: Payer: Self-pay

## 2023-08-24 DIAGNOSIS — I671 Cerebral aneurysm, nonruptured: Secondary | ICD-10-CM

## 2023-08-24 DIAGNOSIS — I110 Hypertensive heart disease with heart failure: Secondary | ICD-10-CM | POA: Diagnosis not present

## 2023-08-24 DIAGNOSIS — S065X0D Traumatic subdural hemorrhage without loss of consciousness, subsequent encounter: Secondary | ICD-10-CM | POA: Diagnosis not present

## 2023-08-24 DIAGNOSIS — I5042 Chronic combined systolic (congestive) and diastolic (congestive) heart failure: Secondary | ICD-10-CM | POA: Diagnosis not present

## 2023-08-24 DIAGNOSIS — I4819 Other persistent atrial fibrillation: Secondary | ICD-10-CM | POA: Diagnosis not present

## 2023-08-26 ENCOUNTER — Ambulatory Visit: Attending: Cardiology | Admitting: *Deleted

## 2023-08-26 DIAGNOSIS — Z5181 Encounter for therapeutic drug level monitoring: Secondary | ICD-10-CM | POA: Diagnosis not present

## 2023-08-26 DIAGNOSIS — Z952 Presence of prosthetic heart valve: Secondary | ICD-10-CM | POA: Diagnosis not present

## 2023-08-26 LAB — POCT INR: INR: 1.8 — AB (ref 2.0–3.0)

## 2023-08-26 NOTE — Progress Notes (Signed)
 INR 1.8  Please see anticoagulation encounter

## 2023-08-26 NOTE — Patient Instructions (Signed)
 Description   Today take 1.5 tablets of warfarin then START taking 1 tablet daily. Recheck in 1 week. Prefers Fridays since she doesn't drive. Stay consistent with greens each week (2 times per week)  Call us  # [phone]  Coumadin  Clinic with any medication changes or concerns.

## 2023-08-31 DIAGNOSIS — D649 Anemia, unspecified: Secondary | ICD-10-CM | POA: Diagnosis not present

## 2023-08-31 DIAGNOSIS — Z8673 Personal history of transient ischemic attack (TIA), and cerebral infarction without residual deficits: Secondary | ICD-10-CM | POA: Diagnosis not present

## 2023-08-31 DIAGNOSIS — K219 Gastro-esophageal reflux disease without esophagitis: Secondary | ICD-10-CM | POA: Diagnosis not present

## 2023-08-31 DIAGNOSIS — Z9181 History of falling: Secondary | ICD-10-CM | POA: Diagnosis not present

## 2023-08-31 DIAGNOSIS — Z7901 Long term (current) use of anticoagulants: Secondary | ICD-10-CM | POA: Diagnosis not present

## 2023-08-31 DIAGNOSIS — I5042 Chronic combined systolic (congestive) and diastolic (congestive) heart failure: Secondary | ICD-10-CM | POA: Diagnosis not present

## 2023-08-31 DIAGNOSIS — I4819 Other persistent atrial fibrillation: Secondary | ICD-10-CM | POA: Diagnosis not present

## 2023-08-31 DIAGNOSIS — E871 Hypo-osmolality and hyponatremia: Secondary | ICD-10-CM | POA: Diagnosis not present

## 2023-08-31 DIAGNOSIS — Z954 Presence of other heart-valve replacement: Secondary | ICD-10-CM | POA: Diagnosis not present

## 2023-08-31 DIAGNOSIS — I110 Hypertensive heart disease with heart failure: Secondary | ICD-10-CM | POA: Diagnosis not present

## 2023-08-31 DIAGNOSIS — E785 Hyperlipidemia, unspecified: Secondary | ICD-10-CM | POA: Diagnosis not present

## 2023-08-31 DIAGNOSIS — D72829 Elevated white blood cell count, unspecified: Secondary | ICD-10-CM | POA: Diagnosis not present

## 2023-09-01 ENCOUNTER — Inpatient Hospital Stay
Admission: RE | Admit: 2023-09-01 | Discharge: 2023-09-01 | Discharge status: Home / Self Care | Source: Ambulatory Visit | Attending: Neurosurgery

## 2023-09-01 DIAGNOSIS — I671 Cerebral aneurysm, nonruptured: Secondary | ICD-10-CM

## 2023-09-01 DIAGNOSIS — I6200 Nontraumatic subdural hemorrhage, unspecified: Secondary | ICD-10-CM | POA: Diagnosis not present

## 2023-09-02 ENCOUNTER — Ambulatory Visit: Attending: Cardiology

## 2023-09-02 ENCOUNTER — Other Ambulatory Visit (HOSPITAL_COMMUNITY): Payer: Self-pay

## 2023-09-02 DIAGNOSIS — Z952 Presence of prosthetic heart valve: Secondary | ICD-10-CM

## 2023-09-02 DIAGNOSIS — Z5181 Encounter for therapeutic drug level monitoring: Secondary | ICD-10-CM

## 2023-09-02 LAB — POCT INR: INR: 1.7 — AB (ref 2.0–3.0)

## 2023-09-02 NOTE — Progress Notes (Signed)
 INR 1.7  Please see anticoagulation encounter

## 2023-09-02 NOTE — Patient Instructions (Signed)
 Description   Take 2 tablets today and 2 tablets tomorrow and then START taking 1 tablet daily EXCEPT 2 tablets on Tuesdays. Recheck in 1 week. Prefers Fridays since she doesn't drive. Stay consistent with greens each week (2 times per week) Call us  # [phone]  Coumadin  Clinic with any medication changes or concerns.

## 2023-09-05 ENCOUNTER — Telehealth: Payer: Self-pay | Admitting: Cardiovascular Disease

## 2023-09-05 MED ORDER — METOPROLOL SUCCINATE ER 25 MG PO TB24: 25.0000 mg | ORAL_TABLET | Freq: Every day | ORAL | 0 refills | Status: DC

## 2023-09-05 MED ORDER — LOSARTAN POTASSIUM 50 MG PO TABS: 50.0000 mg | ORAL_TABLET | Freq: Every day | ORAL | 0 refills | Status: DC

## 2023-09-05 NOTE — Telephone Encounter (Signed)
 Called pt and informed her of refills sent to her pharmacy.  Appt made with an APP 09/22/23- Dr Alveta retired

## 2023-09-05 NOTE — Telephone Encounter (Signed)
 Pt c/o medication issue:  1. Name of Medication:   metoprolol  succinate (TOPROL -XL) 25 MG 24 hr tablet  losartan  (COZAAR ) 50 MG tablet   2. How are you currently taking this medication (dosage and times per day)?   3. Are you having a reaction (difficulty breathing--STAT)?   4. What is your medication issue?   Caller Natasha Davis) stated patient had a medication change and wants a new prescription of patient's current dosage of metoprolol  succinate (TOPROL -XL) 25 MG 24 hr tablet and losartan  (COZAAR ) 50 MG tablet new prescription for sent to [id number] - [place],  - [address].  Caller stated can contact patient directly.

## 2023-09-07 DIAGNOSIS — S065XAA Traumatic subdural hemorrhage with loss of consciousness status unknown, initial encounter: Secondary | ICD-10-CM | POA: Diagnosis not present

## 2023-09-07 DIAGNOSIS — I671 Cerebral aneurysm, nonruptured: Secondary | ICD-10-CM | POA: Diagnosis not present

## 2023-09-08 ENCOUNTER — Ambulatory Visit: Attending: Internal Medicine

## 2023-09-08 DIAGNOSIS — Z952 Presence of prosthetic heart valve: Secondary | ICD-10-CM

## 2023-09-08 DIAGNOSIS — Z5181 Encounter for therapeutic drug level monitoring: Secondary | ICD-10-CM

## 2023-09-08 LAB — POCT INR: INR: 4.4 — AB (ref 2.0–3.0)

## 2023-09-08 NOTE — Patient Instructions (Signed)
 Description   HOLD today's dose and then START taking 1 tablet daily.  Recheck in 1 week.  Prefers Fridays since she doesn't drive. Stay consistent with greens each week (2 times per week) Call us  # [phone]  Coumadin  Clinic with any medication changes or concerns.

## 2023-09-08 NOTE — Progress Notes (Signed)
 INR 4.4 Please see anticoagulation encounter

## 2023-09-09 ENCOUNTER — Encounter

## 2023-09-09 DIAGNOSIS — Z9181 History of falling: Secondary | ICD-10-CM | POA: Diagnosis not present

## 2023-09-09 DIAGNOSIS — S065X0D Traumatic subdural hemorrhage without loss of consciousness, subsequent encounter: Secondary | ICD-10-CM | POA: Diagnosis not present

## 2023-09-09 DIAGNOSIS — Z8673 Personal history of transient ischemic attack (TIA), and cerebral infarction without residual deficits: Secondary | ICD-10-CM | POA: Diagnosis not present

## 2023-09-09 DIAGNOSIS — Z7901 Long term (current) use of anticoagulants: Secondary | ICD-10-CM | POA: Diagnosis not present

## 2023-09-09 DIAGNOSIS — D72829 Elevated white blood cell count, unspecified: Secondary | ICD-10-CM | POA: Diagnosis not present

## 2023-09-09 DIAGNOSIS — I4819 Other persistent atrial fibrillation: Secondary | ICD-10-CM | POA: Diagnosis not present

## 2023-09-09 DIAGNOSIS — K219 Gastro-esophageal reflux disease without esophagitis: Secondary | ICD-10-CM | POA: Diagnosis not present

## 2023-09-09 DIAGNOSIS — Z954 Presence of other heart-valve replacement: Secondary | ICD-10-CM | POA: Diagnosis not present

## 2023-09-09 DIAGNOSIS — E871 Hypo-osmolality and hyponatremia: Secondary | ICD-10-CM | POA: Diagnosis not present

## 2023-09-09 DIAGNOSIS — D649 Anemia, unspecified: Secondary | ICD-10-CM | POA: Diagnosis not present

## 2023-09-09 DIAGNOSIS — I110 Hypertensive heart disease with heart failure: Secondary | ICD-10-CM | POA: Diagnosis not present

## 2023-09-09 DIAGNOSIS — I5042 Chronic combined systolic (congestive) and diastolic (congestive) heart failure: Secondary | ICD-10-CM | POA: Diagnosis not present

## 2023-09-09 DIAGNOSIS — E785 Hyperlipidemia, unspecified: Secondary | ICD-10-CM | POA: Diagnosis not present

## 2023-09-16 ENCOUNTER — Ambulatory Visit: Attending: Internal Medicine

## 2023-09-16 DIAGNOSIS — Z952 Presence of prosthetic heart valve: Secondary | ICD-10-CM | POA: Diagnosis not present

## 2023-09-16 DIAGNOSIS — Z5181 Encounter for therapeutic drug level monitoring: Secondary | ICD-10-CM | POA: Diagnosis not present

## 2023-09-16 LAB — POCT INR: INR: 3.5 — AB (ref 2.0–3.0)

## 2023-09-16 NOTE — Progress Notes (Signed)
 INR 3.5 Please see anticoagulation encounter Continue taking 1 tablet daily.  Recheck in 3 weeks.  Prefers Fridays since she doesn't drive. Stay consistent with greens each week (2 times per week) Call us  # [phone]  Coumadin  Clinic with any medication changes or concerns.

## 2023-09-16 NOTE — Patient Instructions (Signed)
 Continue taking 1 tablet daily.  Recheck in 3 weeks.  Prefers Fridays since she doesn't drive. Stay consistent with greens each week (2 times per week) Call us  # [phone]  Coumadin  Clinic with any medication changes or concerns.

## 2023-09-19 NOTE — Progress Notes (Unsigned)
 Cardiology Clinic Note   Patient Name: Natasha Davis Date of Encounter: 09/19/2023  Primary Care Provider:  Marvine Rush, MD Primary Cardiologist:  Aleene Passe, MD (Inactive)  Patient Profile    Natasha Davis 68 year old female presents to the clinic today for follow-up evaluation of her atrial fibrillation, combined systolic and diastolic CHF, and evaluation of her replaced mechanical mitral valve.  Past Medical History    Past Medical History:  Diagnosis Date   Chronic anticoagulation    Claudication [facility])    History of CVA (cerebrovascular accident)    Hypertension    Mitral stenosis    Seizure (HCC)    HX   Past Surgical History:  Procedure Laterality Date   CARDIAC CATHETERIZATION  06/01/96   NORMAL LEFT VENTRICULAR SYSTOLIC FUNCTION. EF 60%   CESAREAN SECTION     X2   IR ANGIO EXTERNAL CAROTID SEL EXT CAROTID BILAT MOD SED  07/26/2023   IR ANGIO INTRA EXTRACRAN SEL INTERNAL CAROTID BILAT MOD SED  07/26/2023   IR NEURO EACH ADD'L AFTER BASIC UNI LEFT (MS)  07/26/2023   IR NEURO EACH ADD'L AFTER BASIC UNI RIGHT (MS)  07/26/2023   IR TRANSCATH/EMBOLIZ  07/26/2023   MITRAL VALVE REPLACEMENT     RADIOLOGY WITH ANESTHESIA N/A 07/26/2023   Procedure: RADIOLOGY WITH ANESTHESIA;  Surgeon: Lanis Pupa, MD;  Location: [facility] OR;  Service: Radiology;  Laterality: N/A;    Allergies  Allergies  Allergen Reactions   Penicillins Other (See Comments)    Couldn't walk    History of Present Illness    Shaguana W Hoxworth has a PMH of***  Home Medications    Prior to Admission medications   Medication Sig Start Date End Date Taking? Authorizing Provider  acetaminophen  (TYLENOL ) 650 MG CR tablet Take 1,300 mg by mouth daily as needed for pain. Pain    [provider]  atorvastatin  (LIPITOR) 20 MG tablet TAKE 1 TABLET BY MOUTH EVERY DAY 08/11/23   Nahser, Aleene PARAS, MD  levETIRAcetam  (KEPPRA ) 750 MG tablet Take 1 tablet (750 mg total) by mouth 2 (two) times daily.  08/15/23   Georjean Darice HERO, MD  losartan  (COZAAR ) 50 MG tablet Take 1 tablet (50 mg total) by mouth daily. 09/05/23   Thukkani, Arun K, MD  metoprolol  succinate (TOPROL -XL) 25 MG 24 hr tablet Take 1 tablet (25 mg total) by mouth daily. 09/05/23   Thukkani, Arun K, MD  Multiple Vitamins-Minerals (CENTRUM SILVER ADULT 50+) TABS Take 1 tablet by mouth in the morning.    [provider]  omeprazole  (PRILOSEC) 20 MG capsule TAKE 2 CAPSULES BY MOUTH EVERY DAY 11/11/22   Nahser, Aleene PARAS, MD  spironolactone  (ALDACTONE ) 25 MG tablet Take 1 tablet (25 mg total) by mouth daily. Patient not taking: Reported on 08/15/2023 03/28/23   Nahser, Aleene PARAS, MD  triamterene -hydrochlorothiazide  (MAXZIDE -25) 37.5-25 MG tablet Take 1 tablet by mouth daily. 06/14/23   [provider]  warfarin (COUMADIN ) 5 MG tablet TAKE 1 TABLET BY MOUTH DAILY OR AS DIRECTED BY COUMADIN  CLINIC 06/02/23   Nahser, Aleene PARAS, MD    Family History    Family History  Problem Relation Age of Onset   Hypertension Mother    Heart disease Father    Hypertension Father    Heart attack Father    Stroke Brother    She indicated that her mother is alive. She indicated that her father is deceased. She indicated that her brother is deceased.  Social History  Social History   Socioeconomic History   Marital status: Single    Spouse name: Not on file   Number of children: Not on file   Years of education: Not on file   Highest education level: Not on file  Occupational History   Not on file  Tobacco Use   Smoking status: Former    Current packs/day: 0.00    Types: Cigarettes    Quit date: 01/25/2006    Years since quitting: 17.6   Smokeless tobacco: Never  Substance and Sexual Activity   Alcohol use: Not Currently   Drug use: Not Currently   Sexual activity: Never  Other Topics Concern   Not on file  Social History Narrative   ** Merged History Encounter **    Right handed   Lives with grandson and dog   Pt.  Likes to [employer]   Drinks 1 glass of Dr. Nunzio a day   Social Drivers of Health   Financial Resource Strain: Not on file  Food Insecurity: No Food Insecurity (08/09/2023)   Hunger Vital Sign    Worried About Running Out of Food in the Last Year: Never true    Ran Out of Food in the Last Year: Never true  Transportation Needs: No Transportation Needs (08/09/2023)   PRAPARE - Administrator, Civil Service (Medical): No    Lack of Transportation (Non-Medical): No  Physical Activity: Not on file  Stress: Not on file  Social Connections: Moderately Integrated (07/23/2023)   Social Connection and Isolation Panel    Frequency of Communication with Friends and Family: More than three times a week    Frequency of Social Gatherings with Friends and Family: More than three times a week    Attends Religious Services: More than 4 times per year    Active Member of [employer] or Organizations: Yes    Attends Engineer, structural: More than 4 times per year    Marital Status: Divorced  Intimate Partner Violence: Not At Risk (08/09/2023)   Humiliation, Afraid, Rape, and Kick questionnaire    Fear of Current or Ex-Partner: No    Emotionally Abused: No    Physically Abused: No    Sexually Abused: No     Review of Systems    General:  No chills, fever, night sweats or weight changes.  Cardiovascular:  No chest pain, dyspnea on exertion, edema, orthopnea, palpitations, paroxysmal nocturnal dyspnea. Dermatological: No rash, lesions/masses Respiratory: No cough, dyspnea Urologic: No hematuria, dysuria Abdominal:   No nausea, vomiting, diarrhea, bright red blood per rectum, melena, or hematemesis Neurologic:  No visual changes, wkns, changes in mental status. All other systems reviewed and are otherwise negative except as noted above.  Physical Exam    VS:  There were no vitals taken for this visit. , BMI There is no height or weight on file to calculate BMI. GEN: Well nourished, well  developed, in no acute distress. HEENT: normal. Neck: Supple, no JVD, carotid bruits, or masses. Cardiac: RRR, no murmurs, rubs, or gallops. No clubbing, cyanosis, edema.  Radials/DP/PT 2+ and equal bilaterally.  Respiratory:  Respirations regular and unlabored, clear to auscultation bilaterally. GI: Soft, nontender, nondistended, BS + x 4. MS: no deformity or atrophy. Skin: warm and dry, no rash. Neuro:  Strength and sensation are intact. Psych: Normal affect.  Accessory Clinical Findings    Recent Labs: 01/02/2023: B Natriuretic Peptide 207.9; TSH 4.962 07/30/2023: ALT 62 08/01/2023: Magnesium  2.1 08/03/2023: BUN 13; Creatinine, Ser  0.83; Potassium 4.4; Sodium 131 08/08/2023: Hemoglobin 10.6; Platelets 365   Recent Lipid Panel    Component Value Date/Time   CHOL 102 07/19/2023 0443   CHOL 146 03/26/2016 1615   TRIG 57 07/19/2023 0443   HDL 39 (L) 07/19/2023 0443   HDL 50 03/26/2016 1615   CHOLHDL 2.6 07/19/2023 0443   VLDL 11 07/19/2023 0443   LDLCALC 52 07/19/2023 0443   LDLCALC 77 03/26/2016 1615   LDLDIRECT 147.2 03/22/2011 1619    No BP recorded.  {Refresh Note OR Click here to enter BP  :1}***    ECG personally reviewed by me today- ***          Assessment & Plan   1.  ***   Josefa HERO. Delanna Blacketer NP-C     09/19/2023, 1:25 PM [facility] [address] [place], [place] [address] Office [phone]      I spent***minutes examining this patient, reviewing medications, and using patient centered shared decision making involving their cardiac care.   I spent  20 minutes reviewing past medical history,  medications, and prior cardiac tests.

## 2023-09-20 ENCOUNTER — Other Ambulatory Visit: Payer: Self-pay | Admitting: Internal Medicine

## 2023-09-20 ENCOUNTER — Other Ambulatory Visit: Payer: Self-pay | Admitting: Neurosurgery

## 2023-09-20 DIAGNOSIS — S065XAA Traumatic subdural hemorrhage with loss of consciousness status unknown, initial encounter: Secondary | ICD-10-CM

## 2023-09-22 ENCOUNTER — Ambulatory Visit: Attending: Cardiology | Admitting: General Practice

## 2023-09-22 ENCOUNTER — Encounter: Payer: Self-pay | Admitting: General Practice

## 2023-09-22 VITALS — BP 122/70 | HR 90 | Ht 59.0 in | Wt 209.0 lb

## 2023-09-22 DIAGNOSIS — S065XAA Traumatic subdural hemorrhage with loss of consciousness status unknown, initial encounter: Secondary | ICD-10-CM

## 2023-09-22 DIAGNOSIS — I4819 Other persistent atrial fibrillation: Secondary | ICD-10-CM | POA: Diagnosis not present

## 2023-09-22 DIAGNOSIS — I10 Essential (primary) hypertension: Secondary | ICD-10-CM

## 2023-09-22 DIAGNOSIS — I3489 Other nonrheumatic mitral valve disorders: Secondary | ICD-10-CM

## 2023-09-22 DIAGNOSIS — I5043 Acute on chronic combined systolic (congestive) and diastolic (congestive) heart failure: Secondary | ICD-10-CM | POA: Diagnosis not present

## 2023-09-22 DIAGNOSIS — Z8673 Personal history of transient ischemic attack (TIA), and cerebral infarction without residual deficits: Secondary | ICD-10-CM

## 2023-09-22 DIAGNOSIS — Z952 Presence of prosthetic heart valve: Secondary | ICD-10-CM | POA: Diagnosis not present

## 2023-09-22 DIAGNOSIS — S065XAD Traumatic subdural hemorrhage with loss of consciousness status unknown, subsequent encounter: Secondary | ICD-10-CM

## 2023-09-22 MED ORDER — ATORVASTATIN CALCIUM 20 MG PO TABS: 20.0000 mg | ORAL_TABLET | Freq: Every day | ORAL | 3 refills | Status: AC

## 2023-09-22 NOTE — Addendum Note (Signed)
 Addended by: GORDON RONAL SQUIBB on: 09/22/2023 02:32 PM   Modules accepted: Orders

## 2023-09-22 NOTE — Patient Instructions (Signed)
 Medication Instructions:  Refill for Atorvastatin  (Lipitor) has been sent to your pharmacy.  *If you need a refill on your cardiac medications before your next appointment, please call your pharmacy*  Lab Work: None ordered today. If you have labs (blood work) drawn today and your tests are completely normal, you will receive your results only by: MyChart Message (if you have MyChart) OR A paper copy in the mail If you have any lab test that is abnormal or we need to change your treatment, we will call you to review the results.  Testing/Procedures: None ordered today.  Follow-Up: At [facility], you and your health needs are our priority.  As part of our continuing mission to provide you with exceptional heart care, our providers are all part of one team.  This team includes your primary Cardiologist (physician) and Advanced Practice Providers or APPs (Physician Assistants and Nurse Practitioners) who all work together to provide you with the care you need, when you need it.  Your next appointment:   4-6 month(s)  Provider:   Shelda Bruckner, MD or Josefa Beauvais, NP   We recommend signing up for the patient portal called MyChart.  Sign up information is provided on this After Visit Summary.  MyChart is used to connect with patients for Virtual Visits (Telemedicine).  Patients are able to view lab/test results, encounter notes, upcoming appointments, etc.  Non-urgent messages can be sent to your provider as well.   To learn more about what you can do with MyChart, go to [email or web address].   Other Instructions

## 2023-10-03 ENCOUNTER — Ambulatory Visit
Admission: RE | Admit: 2023-10-03 | Discharge: 2023-10-03 | Disposition: A | Discharge status: Home / Self Care | Source: Ambulatory Visit | Attending: Neurosurgery | Admitting: Neurosurgery

## 2023-10-03 DIAGNOSIS — S065XAA Traumatic subdural hemorrhage with loss of consciousness status unknown, initial encounter: Secondary | ICD-10-CM

## 2023-10-03 DIAGNOSIS — I6200 Nontraumatic subdural hemorrhage, unspecified: Secondary | ICD-10-CM | POA: Diagnosis not present

## 2023-10-07 ENCOUNTER — Ambulatory Visit: Attending: Cardiology | Admitting: *Deleted

## 2023-10-07 DIAGNOSIS — Z7901 Long term (current) use of anticoagulants: Secondary | ICD-10-CM

## 2023-10-07 DIAGNOSIS — Z952 Presence of prosthetic heart valve: Secondary | ICD-10-CM

## 2023-10-07 DIAGNOSIS — Z5181 Encounter for therapeutic drug level monitoring: Secondary | ICD-10-CM | POA: Diagnosis not present

## 2023-10-07 LAB — POCT INR: INR: 2.9 (ref 2.0–3.0)

## 2023-10-07 NOTE — Patient Instructions (Signed)
 Description   INR-2.9; Continue taking 1 tablet daily. Recheck in 4 weeks.  Prefers Fridays since she doesn't drive. Stay consistent with greens each week (2 times per week-Sunday and Wednesday). Call us  # [phone]  Coumadin  Clinic with any medication changes or concerns.

## 2023-10-07 NOTE — Progress Notes (Signed)
 Description   INR-2.9; Continue taking 1 tablet daily. Recheck in 4 weeks.  Prefers Fridays since she doesn't drive. Stay consistent with greens each week (2 times per week-Sunday and Wednesday). Call us  # [phone]  Coumadin  Clinic with any medication changes or concerns.

## 2023-10-19 DIAGNOSIS — S065XAA Traumatic subdural hemorrhage with loss of consciousness status unknown, initial encounter: Secondary | ICD-10-CM | POA: Diagnosis not present

## 2023-11-04 ENCOUNTER — Ambulatory Visit: Admitting: Pharmacist

## 2023-11-04 DIAGNOSIS — Z952 Presence of prosthetic heart valve: Secondary | ICD-10-CM

## 2023-11-04 DIAGNOSIS — Z5181 Encounter for therapeutic drug level monitoring: Secondary | ICD-10-CM | POA: Diagnosis not present

## 2023-11-04 DIAGNOSIS — Z7901 Long term (current) use of anticoagulants: Secondary | ICD-10-CM | POA: Diagnosis not present

## 2023-11-04 LAB — POCT INR: INR: 2.2 (ref 2.0–3.0)

## 2023-11-04 NOTE — Patient Instructions (Addendum)
 Description   INR-2.2; Take 2 tablets today and then continue taking 1 tablet daily. Recheck in 4 weeks.  Prefers Fridays since she doesn't drive. Stay consistent with greens each week (2 times per week-Sunday and Wednesday). Call us  # [phone]  Coumadin  Clinic with any medication changes or concerns.

## 2023-11-04 NOTE — Progress Notes (Signed)
 Description   INR-2.2; Take 2 tablets today and then continue taking 1 tablet daily. Recheck in 4 weeks.  Prefers Fridays since she doesn't drive. Stay consistent with greens each week (2 times per week-Sunday and Wednesday). Call us  # [phone]  Coumadin  Clinic with any medication changes or concerns.

## 2023-11-09 ENCOUNTER — Other Ambulatory Visit: Payer: Self-pay | Admitting: Neurosurgery

## 2023-11-09 DIAGNOSIS — S065XAA Traumatic subdural hemorrhage with loss of consciousness status unknown, initial encounter: Secondary | ICD-10-CM

## 2023-11-14 ENCOUNTER — Other Ambulatory Visit: Payer: Self-pay

## 2023-11-14 DIAGNOSIS — Z952 Presence of prosthetic heart valve: Secondary | ICD-10-CM

## 2023-11-14 MED ORDER — WARFARIN SODIUM 5 MG PO TABS: ORAL_TABLET | ORAL | 1 refills | Status: AC

## 2023-11-17 ENCOUNTER — Ambulatory Visit
Admission: RE | Admit: 2023-11-17 | Discharge: 2023-11-17 | Disposition: A | Discharge status: Home / Self Care | Source: Ambulatory Visit | Attending: Neurosurgery | Admitting: Neurosurgery

## 2023-11-17 DIAGNOSIS — S065XAA Traumatic subdural hemorrhage with loss of consciousness status unknown, initial encounter: Secondary | ICD-10-CM

## 2023-11-17 DIAGNOSIS — I6200 Nontraumatic subdural hemorrhage, unspecified: Secondary | ICD-10-CM | POA: Diagnosis not present

## 2023-11-28 DIAGNOSIS — S065XAA Traumatic subdural hemorrhage with loss of consciousness status unknown, initial encounter: Secondary | ICD-10-CM | POA: Diagnosis not present

## 2023-11-30 ENCOUNTER — Telehealth: Payer: Self-pay | Admitting: General Practice

## 2023-11-30 ENCOUNTER — Other Ambulatory Visit: Payer: Self-pay | Admitting: Internal Medicine

## 2023-11-30 MED ORDER — METOPROLOL SUCCINATE ER 25 MG PO TB24: 25.0000 mg | ORAL_TABLET | Freq: Every day | ORAL | 2 refills | Status: AC

## 2023-11-30 NOTE — Telephone Encounter (Signed)
 Pt's medication was sent to pt's pharmacy as requested. Confirmation received.

## 2023-11-30 NOTE — Telephone Encounter (Signed)
*  STAT* If patient is at the pharmacy, call can be transferred to refill team.   1. Which medications need to be refilled? (please list name of each medication and dose if known) metoprolol  succinate (TOPROL -XL) 25 MG 24 hr tablet    2. Would you like to learn more about the convenience, safety, & potential cost savings by using the [facility]?    3. Are you open to using the [facility] (Type [facility]   4. Which pharmacy/location (including street and city if local pharmacy) is medication to be sent to? [id number] [place], [place] - [address]    5. Do they need a 30 day or 90 day supply? 90 day

## 2023-12-02 ENCOUNTER — Ambulatory Visit

## 2023-12-02 ENCOUNTER — Telehealth (HOSPITAL_BASED_OUTPATIENT_CLINIC_OR_DEPARTMENT_OTHER): Payer: Self-pay | Admitting: Cardiology

## 2023-12-02 MED ORDER — LOSARTAN POTASSIUM 50 MG PO TABS: 50.0000 mg | ORAL_TABLET | Freq: Every day | ORAL | 1 refills | Status: AC

## 2023-12-02 NOTE — Telephone Encounter (Signed)
*  STAT* If patient is at the pharmacy, call can be transferred to refill team.   1. Which medications need to be refilled? (please list name of each medication and dose if known)   losartan  (COZAAR ) 50 MG tablet   2. Would you like to learn more about the convenience, safety, & potential cost savings by using the [facility]?   3. Are you open to using the [facility] (Type [facility]. ).  4. Which pharmacy/location (including street and city if local pharmacy) is medication to be sent to?  [id number] [place], [place] - [address]   5. Do they need a 30 day or 90 day supply?   90 day  Patient stated she has 3 tablets left.   Patient has appointment scheduled with DOROTHA Beauvais on 03/21/24.

## 2023-12-02 NOTE — Telephone Encounter (Signed)
 Refill sent.

## 2023-12-06 ENCOUNTER — Encounter: Payer: Self-pay | Admitting: Neurology

## 2023-12-08 ENCOUNTER — Ambulatory Visit: Attending: Internal Medicine

## 2023-12-08 DIAGNOSIS — Z952 Presence of prosthetic heart valve: Secondary | ICD-10-CM

## 2023-12-08 DIAGNOSIS — Z5181 Encounter for therapeutic drug level monitoring: Secondary | ICD-10-CM | POA: Diagnosis not present

## 2023-12-08 LAB — POCT INR: INR: 2.7 (ref 2.0–3.0)

## 2023-12-08 NOTE — Progress Notes (Signed)
 INR 2.7; Please see anticoagulation encounter continue taking 1 tablet daily. Recheck in 6 weeks.  Prefers Fridays since she doesn't drive. Stay consistent with greens each week (2 times per week-Sunday and Wednesday). Call us  # [phone]  Coumadin  Clinic with any medication changes or concerns.

## 2023-12-08 NOTE — Patient Instructions (Signed)
 continue taking 1 tablet daily. Recheck in 6 weeks.  Prefers Fridays since she doesn't drive. Stay consistent with greens each week (2 times per week-Sunday and Wednesday). Call us  # [phone]  Coumadin  Clinic with any medication changes or concerns.

## 2024-01-20 ENCOUNTER — Ambulatory Visit: Attending: Cardiology

## 2024-01-20 DIAGNOSIS — Z952 Presence of prosthetic heart valve: Secondary | ICD-10-CM | POA: Diagnosis not present

## 2024-01-20 DIAGNOSIS — Z5181 Encounter for therapeutic drug level monitoring: Secondary | ICD-10-CM | POA: Diagnosis not present

## 2024-01-20 LAB — POCT INR: INR: 3.2 — AB (ref 2.0–3.0)

## 2024-01-20 NOTE — Patient Instructions (Signed)
 continue taking 1 tablet daily. Recheck in 6 weeks.  Prefers Fridays since she doesn't drive. Stay consistent with greens each week (2 times per week-Sunday and Wednesday). Call us  # [phone]  Coumadin  Clinic with any medication changes or concerns.

## 2024-01-20 NOTE — Progress Notes (Signed)
"   INR 3.2 Please see anticoagulation encounter continue taking 1 tablet daily. Recheck in 6 weeks.  Prefers Fridays since she doesn't drive. Stay consistent with greens each week (2 times per week-Sunday and Wednesday). Call us  # [phone]  Coumadin  Clinic with any medication changes or concerns.  "

## 2024-02-17 ENCOUNTER — Ambulatory Visit: Admitting: Neurology

## 2024-02-17 ENCOUNTER — Encounter: Payer: Self-pay | Admitting: Neurology

## 2024-03-02 ENCOUNTER — Ambulatory Visit

## 2024-03-08 ENCOUNTER — Ambulatory Visit

## 2024-03-21 ENCOUNTER — Ambulatory Visit: Admitting: General Practice

## 2024-08-20 ENCOUNTER — Ambulatory Visit: Payer: Self-pay | Admitting: Neurology
